# Patient Record
Sex: Female | Born: 1945
Health system: Southern US, Community
[De-identification: ages and names within clinical notes are randomized; demographics above are authoritative.]

## PROBLEM LIST (undated history)

## (undated) DIAGNOSIS — T4145XA Adverse effect of unspecified anesthetic, initial encounter: Secondary | ICD-10-CM

## (undated) DIAGNOSIS — K219 Gastro-esophageal reflux disease without esophagitis: Secondary | ICD-10-CM

## (undated) DIAGNOSIS — Z87442 Personal history of urinary calculi: Secondary | ICD-10-CM

## (undated) DIAGNOSIS — F40240 Claustrophobia: Secondary | ICD-10-CM

## (undated) DIAGNOSIS — I82409 Acute embolism and thrombosis of unspecified deep veins of unspecified lower extremity: Secondary | ICD-10-CM

## (undated) DIAGNOSIS — Z9289 Personal history of other medical treatment: Secondary | ICD-10-CM

## (undated) DIAGNOSIS — E039 Hypothyroidism, unspecified: Secondary | ICD-10-CM

## (undated) DIAGNOSIS — D649 Anemia, unspecified: Secondary | ICD-10-CM

## (undated) DIAGNOSIS — E7800 Pure hypercholesterolemia, unspecified: Secondary | ICD-10-CM

## (undated) DIAGNOSIS — Z8719 Personal history of other diseases of the digestive system: Secondary | ICD-10-CM

## (undated) DIAGNOSIS — I2699 Other pulmonary embolism without acute cor pulmonale: Secondary | ICD-10-CM

## (undated) DIAGNOSIS — T8859XA Other complications of anesthesia, initial encounter: Secondary | ICD-10-CM

## (undated) DIAGNOSIS — R011 Cardiac murmur, unspecified: Secondary | ICD-10-CM

## (undated) DIAGNOSIS — I10 Essential (primary) hypertension: Secondary | ICD-10-CM

## (undated) DIAGNOSIS — Z9889 Other specified postprocedural states: Secondary | ICD-10-CM

## (undated) DIAGNOSIS — R112 Nausea with vomiting, unspecified: Secondary | ICD-10-CM

## (undated) DIAGNOSIS — M1990 Unspecified osteoarthritis, unspecified site: Secondary | ICD-10-CM

## (undated) DIAGNOSIS — F410 Panic disorder [episodic paroxysmal anxiety] without agoraphobia: Secondary | ICD-10-CM

## (undated) DIAGNOSIS — I350 Nonrheumatic aortic (valve) stenosis: Secondary | ICD-10-CM

## (undated) HISTORY — DX: Acute embolism and thrombosis of unspecified deep veins of unspecified lower extremity: I82.409

## (undated) HISTORY — DX: Anemia, unspecified: D64.9

## (undated) HISTORY — PX: COLONOSCOPY: SHX174

## (undated) HISTORY — PX: BACK SURGERY: SHX140

## (undated) HISTORY — PX: CARDIAC CATHETERIZATION: SHX172

## (undated) HISTORY — PX: DILATION AND CURETTAGE OF UTERUS: SHX78

## (undated) HISTORY — PX: JOINT REPLACEMENT: SHX530

## (undated) HISTORY — PX: FRACTURE SURGERY: SHX138

## (undated) HISTORY — PX: CARPAL TUNNEL RELEASE: SHX101

---

## 1971-10-19 HISTORY — PX: APPENDECTOMY: SHX54

## 1971-10-19 HISTORY — PX: TUBAL LIGATION: SHX77

## 1972-10-18 HISTORY — PX: MASS EXCISION: SHX2000

## 1973-10-18 HISTORY — PX: ABDOMINAL HYSTERECTOMY: SHX81

## 1984-10-18 HISTORY — PX: TONSILLECTOMY: SUR1361

## 1995-10-19 HISTORY — PX: CHOLECYSTECTOMY: SHX55

## 1998-05-23 ENCOUNTER — Ambulatory Visit (HOSPITAL_COMMUNITY): Admission: RE | Admit: 1998-05-23 | Discharge: 1998-05-23 | Payer: Self-pay | Admitting: Neurological Surgery

## 1998-06-02 ENCOUNTER — Ambulatory Visit (HOSPITAL_COMMUNITY): Admission: RE | Admit: 1998-06-02 | Discharge: 1998-06-02 | Payer: Self-pay | Admitting: Neurological Surgery

## 1999-03-12 ENCOUNTER — Encounter: Admission: RE | Admit: 1999-03-12 | Discharge: 1999-04-03 | Payer: Self-pay | Admitting: Neurological Surgery

## 1999-04-09 ENCOUNTER — Encounter: Admission: RE | Admit: 1999-04-09 | Discharge: 1999-07-08 | Payer: Self-pay | Admitting: Neurological Surgery

## 1999-04-14 ENCOUNTER — Other Ambulatory Visit: Admission: RE | Admit: 1999-04-14 | Discharge: 1999-04-14 | Payer: Self-pay | Admitting: Obstetrics and Gynecology

## 1999-05-18 ENCOUNTER — Ambulatory Visit (HOSPITAL_COMMUNITY): Admission: RE | Admit: 1999-05-18 | Discharge: 1999-05-18 | Payer: Self-pay | Admitting: Neurological Surgery

## 1999-05-18 ENCOUNTER — Encounter: Payer: Self-pay | Admitting: Neurological Surgery

## 1999-07-16 ENCOUNTER — Encounter: Payer: Self-pay | Admitting: Neurological Surgery

## 1999-07-20 ENCOUNTER — Inpatient Hospital Stay (HOSPITAL_COMMUNITY): Admission: RE | Admit: 1999-07-20 | Discharge: 1999-07-21 | Payer: Self-pay | Admitting: Neurological Surgery

## 1999-07-20 ENCOUNTER — Encounter: Payer: Self-pay | Admitting: Neurological Surgery

## 1999-08-11 ENCOUNTER — Encounter: Payer: Self-pay | Admitting: Neurological Surgery

## 1999-08-11 ENCOUNTER — Encounter: Admission: RE | Admit: 1999-08-11 | Discharge: 1999-08-11 | Payer: Self-pay | Admitting: Neurological Surgery

## 2000-05-18 ENCOUNTER — Encounter: Payer: Self-pay | Admitting: *Deleted

## 2000-05-18 ENCOUNTER — Inpatient Hospital Stay (HOSPITAL_COMMUNITY): Admission: AD | Admit: 2000-05-18 | Discharge: 2000-05-20 | Payer: Self-pay | Admitting: *Deleted

## 2000-06-17 ENCOUNTER — Other Ambulatory Visit: Admission: RE | Admit: 2000-06-17 | Discharge: 2000-06-17 | Payer: Self-pay | Admitting: Obstetrics and Gynecology

## 2000-08-02 ENCOUNTER — Ambulatory Visit (HOSPITAL_COMMUNITY): Admission: RE | Admit: 2000-08-02 | Discharge: 2000-08-02 | Payer: Self-pay | Admitting: Neurological Surgery

## 2000-08-10 ENCOUNTER — Other Ambulatory Visit: Admission: RE | Admit: 2000-08-10 | Discharge: 2000-08-10 | Payer: Self-pay | Admitting: Obstetrics and Gynecology

## 2001-02-01 ENCOUNTER — Encounter: Admission: RE | Admit: 2001-02-01 | Discharge: 2001-02-01 | Payer: Self-pay | Admitting: Neurological Surgery

## 2001-02-01 ENCOUNTER — Encounter: Payer: Self-pay | Admitting: Neurological Surgery

## 2001-02-23 ENCOUNTER — Encounter: Payer: Self-pay | Admitting: Neurological Surgery

## 2001-02-23 ENCOUNTER — Ambulatory Visit (HOSPITAL_COMMUNITY): Admission: RE | Admit: 2001-02-23 | Discharge: 2001-02-23 | Payer: Self-pay | Admitting: Neurological Surgery

## 2001-08-16 ENCOUNTER — Other Ambulatory Visit: Admission: RE | Admit: 2001-08-16 | Discharge: 2001-08-16 | Payer: Self-pay | Admitting: Obstetrics and Gynecology

## 2001-11-21 ENCOUNTER — Ambulatory Visit (HOSPITAL_COMMUNITY): Admission: RE | Admit: 2001-11-21 | Discharge: 2001-11-21 | Payer: Self-pay | Admitting: Gastroenterology

## 2001-12-26 ENCOUNTER — Ambulatory Visit (HOSPITAL_COMMUNITY): Admission: RE | Admit: 2001-12-26 | Discharge: 2001-12-26 | Payer: Self-pay | Admitting: Gastroenterology

## 2002-01-12 ENCOUNTER — Encounter: Payer: Self-pay | Admitting: Surgery

## 2002-01-12 ENCOUNTER — Encounter: Admission: RE | Admit: 2002-01-12 | Discharge: 2002-01-12 | Payer: Self-pay | Admitting: Surgery

## 2002-03-02 ENCOUNTER — Ambulatory Visit (HOSPITAL_COMMUNITY): Admission: RE | Admit: 2002-03-02 | Discharge: 2002-03-02 | Payer: Self-pay | Admitting: Surgery

## 2002-03-02 ENCOUNTER — Encounter: Payer: Self-pay | Admitting: Surgery

## 2002-09-14 ENCOUNTER — Encounter: Payer: Self-pay | Admitting: Neurological Surgery

## 2002-09-14 ENCOUNTER — Ambulatory Visit (HOSPITAL_COMMUNITY): Admission: RE | Admit: 2002-09-14 | Discharge: 2002-09-14 | Payer: Self-pay | Admitting: Neurological Surgery

## 2002-09-25 ENCOUNTER — Emergency Department (HOSPITAL_COMMUNITY): Admission: EM | Admit: 2002-09-25 | Discharge: 2002-09-25 | Payer: Self-pay | Admitting: Emergency Medicine

## 2002-09-25 ENCOUNTER — Encounter: Payer: Self-pay | Admitting: Neurological Surgery

## 2002-10-03 ENCOUNTER — Other Ambulatory Visit: Admission: RE | Admit: 2002-10-03 | Discharge: 2002-10-03 | Payer: Self-pay | Admitting: Obstetrics and Gynecology

## 2002-10-25 ENCOUNTER — Encounter: Payer: Self-pay | Admitting: Neurological Surgery

## 2002-10-29 ENCOUNTER — Encounter: Payer: Self-pay | Admitting: Neurological Surgery

## 2002-10-29 ENCOUNTER — Inpatient Hospital Stay (HOSPITAL_COMMUNITY): Admission: RE | Admit: 2002-10-29 | Discharge: 2002-11-03 | Payer: Self-pay | Admitting: Neurological Surgery

## 2002-11-29 ENCOUNTER — Encounter: Payer: Self-pay | Admitting: Neurological Surgery

## 2002-11-29 ENCOUNTER — Ambulatory Visit (HOSPITAL_COMMUNITY): Admission: RE | Admit: 2002-11-29 | Discharge: 2002-11-29 | Payer: Self-pay | Admitting: Neurological Surgery

## 2002-12-11 ENCOUNTER — Ambulatory Visit (HOSPITAL_COMMUNITY): Admission: RE | Admit: 2002-12-11 | Discharge: 2002-12-11 | Payer: Self-pay | Admitting: Neurological Surgery

## 2003-05-07 ENCOUNTER — Ambulatory Visit (HOSPITAL_COMMUNITY): Admission: RE | Admit: 2003-05-07 | Discharge: 2003-05-07 | Payer: Self-pay | Admitting: Internal Medicine

## 2003-05-07 ENCOUNTER — Encounter: Payer: Self-pay | Admitting: Internal Medicine

## 2003-10-29 ENCOUNTER — Other Ambulatory Visit: Admission: RE | Admit: 2003-10-29 | Discharge: 2003-10-29 | Payer: Self-pay | Admitting: Obstetrics and Gynecology

## 2003-11-19 HISTORY — PX: JOINT REPLACEMENT: SHX530

## 2004-04-23 ENCOUNTER — Emergency Department (HOSPITAL_COMMUNITY): Admission: EM | Admit: 2004-04-23 | Discharge: 2004-04-23 | Payer: Self-pay | Admitting: Emergency Medicine

## 2004-04-30 ENCOUNTER — Ambulatory Visit (HOSPITAL_COMMUNITY): Admission: RE | Admit: 2004-04-30 | Discharge: 2004-04-30 | Payer: Self-pay | Admitting: Endocrinology

## 2004-05-14 ENCOUNTER — Ambulatory Visit (HOSPITAL_COMMUNITY): Admission: RE | Admit: 2004-05-14 | Discharge: 2004-05-14 | Payer: Self-pay | Admitting: Gastroenterology

## 2004-07-08 ENCOUNTER — Encounter: Admission: RE | Admit: 2004-07-08 | Discharge: 2004-07-08 | Payer: Self-pay | Admitting: Orthopedic Surgery

## 2004-09-08 ENCOUNTER — Emergency Department (HOSPITAL_COMMUNITY): Admission: EM | Admit: 2004-09-08 | Discharge: 2004-09-08 | Payer: Self-pay | Admitting: Emergency Medicine

## 2004-10-22 ENCOUNTER — Inpatient Hospital Stay (HOSPITAL_COMMUNITY): Admission: RE | Admit: 2004-10-22 | Discharge: 2004-10-27 | Payer: Self-pay | Admitting: Orthopedic Surgery

## 2006-01-07 ENCOUNTER — Encounter: Admission: RE | Admit: 2006-01-07 | Discharge: 2006-01-07 | Payer: Self-pay | Admitting: Internal Medicine

## 2006-04-19 ENCOUNTER — Encounter: Admission: RE | Admit: 2006-04-19 | Discharge: 2006-04-19 | Payer: Self-pay | Admitting: Orthopedic Surgery

## 2006-04-27 ENCOUNTER — Encounter: Admission: RE | Admit: 2006-04-27 | Discharge: 2006-04-27 | Payer: Self-pay | Admitting: Obstetrics and Gynecology

## 2006-08-16 ENCOUNTER — Inpatient Hospital Stay (HOSPITAL_COMMUNITY): Admission: RE | Admit: 2006-08-16 | Discharge: 2006-08-20 | Payer: Self-pay | Admitting: Orthopedic Surgery

## 2006-09-20 ENCOUNTER — Encounter: Payer: Self-pay | Admitting: Vascular Surgery

## 2006-09-20 ENCOUNTER — Ambulatory Visit (HOSPITAL_COMMUNITY): Admission: RE | Admit: 2006-09-20 | Discharge: 2006-09-20 | Payer: Self-pay | Admitting: Orthopedic Surgery

## 2006-10-18 HISTORY — PX: HIP SURGERY: SHX245

## 2006-11-04 ENCOUNTER — Ambulatory Visit (HOSPITAL_COMMUNITY): Admission: RE | Admit: 2006-11-04 | Discharge: 2006-11-04 | Payer: Self-pay | Admitting: Neurological Surgery

## 2006-12-01 ENCOUNTER — Inpatient Hospital Stay (HOSPITAL_COMMUNITY): Admission: RE | Admit: 2006-12-01 | Discharge: 2006-12-21 | Payer: Self-pay | Admitting: Neurological Surgery

## 2006-12-12 ENCOUNTER — Ambulatory Visit: Payer: Self-pay | Admitting: Vascular Surgery

## 2006-12-12 ENCOUNTER — Encounter (INDEPENDENT_AMBULATORY_CARE_PROVIDER_SITE_OTHER): Payer: Self-pay | Admitting: Cardiology

## 2007-02-22 ENCOUNTER — Ambulatory Visit (HOSPITAL_COMMUNITY): Admission: RE | Admit: 2007-02-22 | Discharge: 2007-02-22 | Payer: Self-pay | Admitting: Internal Medicine

## 2007-02-22 ENCOUNTER — Encounter: Payer: Self-pay | Admitting: Vascular Surgery

## 2007-02-22 ENCOUNTER — Ambulatory Visit: Payer: Self-pay | Admitting: Vascular Surgery

## 2007-04-04 ENCOUNTER — Ambulatory Visit (HOSPITAL_COMMUNITY): Admission: RE | Admit: 2007-04-04 | Discharge: 2007-04-04 | Payer: Self-pay | Admitting: Endocrinology

## 2007-05-04 ENCOUNTER — Encounter: Admission: RE | Admit: 2007-05-04 | Discharge: 2007-05-04 | Payer: Self-pay | Admitting: Internal Medicine

## 2007-05-15 ENCOUNTER — Encounter (INDEPENDENT_AMBULATORY_CARE_PROVIDER_SITE_OTHER): Payer: Self-pay | Admitting: Orthopedic Surgery

## 2007-05-15 ENCOUNTER — Ambulatory Visit (HOSPITAL_COMMUNITY): Admission: RE | Admit: 2007-05-15 | Discharge: 2007-05-15 | Payer: Self-pay | Admitting: Orthopedic Surgery

## 2007-06-05 ENCOUNTER — Encounter: Admission: RE | Admit: 2007-06-05 | Discharge: 2007-06-05 | Payer: Self-pay | Admitting: Orthopedic Surgery

## 2007-06-05 ENCOUNTER — Ambulatory Visit: Payer: Self-pay | Admitting: Vascular Surgery

## 2007-10-19 HISTORY — PX: OTHER SURGICAL HISTORY: SHX169

## 2007-12-07 ENCOUNTER — Encounter: Admission: RE | Admit: 2007-12-07 | Discharge: 2007-12-07 | Payer: Self-pay | Admitting: Orthopedic Surgery

## 2008-01-16 ENCOUNTER — Observation Stay (HOSPITAL_COMMUNITY): Admission: EM | Admit: 2008-01-16 | Discharge: 2008-01-17 | Payer: Self-pay | Admitting: Emergency Medicine

## 2008-01-16 ENCOUNTER — Ambulatory Visit: Payer: Self-pay | Admitting: *Deleted

## 2008-01-30 ENCOUNTER — Inpatient Hospital Stay (HOSPITAL_COMMUNITY): Admission: RE | Admit: 2008-01-30 | Discharge: 2008-02-02 | Payer: Self-pay | Admitting: Orthopedic Surgery

## 2008-04-10 ENCOUNTER — Inpatient Hospital Stay (HOSPITAL_COMMUNITY): Admission: AD | Admit: 2008-04-10 | Discharge: 2008-04-15 | Payer: Self-pay | Admitting: Orthopedic Surgery

## 2008-08-09 ENCOUNTER — Emergency Department (HOSPITAL_COMMUNITY): Admission: EM | Admit: 2008-08-09 | Discharge: 2008-08-09 | Payer: Self-pay | Admitting: Emergency Medicine

## 2008-08-12 ENCOUNTER — Ambulatory Visit (HOSPITAL_COMMUNITY): Admission: RE | Admit: 2008-08-12 | Discharge: 2008-08-12 | Payer: Self-pay | Admitting: Neurological Surgery

## 2008-08-22 ENCOUNTER — Inpatient Hospital Stay (HOSPITAL_COMMUNITY): Admission: RE | Admit: 2008-08-22 | Discharge: 2008-08-23 | Payer: Self-pay | Admitting: Neurological Surgery

## 2008-10-15 ENCOUNTER — Ambulatory Visit (HOSPITAL_COMMUNITY): Admission: RE | Admit: 2008-10-15 | Discharge: 2008-10-15 | Payer: Self-pay | Admitting: Orthopedic Surgery

## 2009-06-06 ENCOUNTER — Encounter: Admission: RE | Admit: 2009-06-06 | Discharge: 2009-06-06 | Payer: Self-pay | Admitting: Orthopedic Surgery

## 2009-12-16 HISTORY — PX: JOINT REPLACEMENT: SHX530

## 2009-12-30 ENCOUNTER — Inpatient Hospital Stay (HOSPITAL_COMMUNITY): Admission: RE | Admit: 2009-12-30 | Discharge: 2010-01-02 | Payer: Self-pay | Admitting: Orthopedic Surgery

## 2010-02-17 ENCOUNTER — Ambulatory Visit: Payer: Self-pay | Admitting: Vascular Surgery

## 2010-03-13 ENCOUNTER — Encounter: Admission: RE | Admit: 2010-03-13 | Discharge: 2010-03-13 | Payer: Self-pay | Admitting: Orthopedic Surgery

## 2010-05-05 ENCOUNTER — Encounter (INDEPENDENT_AMBULATORY_CARE_PROVIDER_SITE_OTHER): Payer: Self-pay | Admitting: Orthopedic Surgery

## 2010-05-05 ENCOUNTER — Inpatient Hospital Stay (HOSPITAL_COMMUNITY): Admission: RE | Admit: 2010-05-05 | Discharge: 2010-05-08 | Payer: Self-pay | Admitting: Orthopedic Surgery

## 2010-07-13 ENCOUNTER — Ambulatory Visit (HOSPITAL_COMMUNITY): Admission: EM | Admit: 2010-07-13 | Discharge: 2010-07-13 | Payer: Self-pay | Admitting: Emergency Medicine

## 2010-11-08 ENCOUNTER — Encounter: Payer: Self-pay | Admitting: Neurological Surgery

## 2010-11-27 ENCOUNTER — Encounter (HOSPITAL_COMMUNITY)
Admission: RE | Admit: 2010-11-27 | Discharge: 2010-11-27 | Disposition: A | Discharge status: Home / Self Care | Payer: Medicare Other | Source: Ambulatory Visit | Attending: Orthopedic Surgery | Admitting: Orthopedic Surgery

## 2010-11-27 DIAGNOSIS — Z01812 Encounter for preprocedural laboratory examination: Secondary | ICD-10-CM | POA: Insufficient documentation

## 2010-11-27 LAB — URINALYSIS, ROUTINE W REFLEX MICROSCOPIC
Leukocytes, UA: NEGATIVE
Urine Glucose, Fasting: NEGATIVE mg/dL
Urobilinogen, UA: 0.2 mg/dL (ref 0.0–1.0)
pH: 6 (ref 5.0–8.0)

## 2010-11-27 LAB — DIFFERENTIAL
Basophils Absolute: 0 10*3/uL (ref 0.0–0.1)
Lymphocytes Relative: 25 % (ref 12–46)

## 2010-11-27 LAB — BASIC METABOLIC PANEL
Calcium: 10.6 mg/dL — ABNORMAL HIGH (ref 8.4–10.5)
Chloride: 101 mEq/L (ref 96–112)
GFR calc Af Amer: >60 mL/min (ref >60)

## 2010-11-27 LAB — CBC
MCV: 80.9 fL (ref 78.0–100.0)
Platelets: 383 10*3/uL (ref 150–400)
WBC: 7.4 10*3/uL (ref 4.0–10.5)

## 2010-11-27 LAB — PROTIME-INR: INR: 1.25 (ref 0.00–1.49)

## 2010-11-27 LAB — URINE MICROSCOPIC-ADD ON

## 2010-11-29 ENCOUNTER — Emergency Department (HOSPITAL_COMMUNITY): Payer: Medicare Other

## 2010-11-29 ENCOUNTER — Emergency Department (HOSPITAL_COMMUNITY)
Admission: EM | Admit: 2010-11-29 | Discharge: 2010-11-29 | Disposition: A | Discharge status: Other Acute Inpatient Hospital | Payer: Medicare Other | Source: Home / Self Care | Attending: Emergency Medicine | Admitting: Emergency Medicine

## 2010-11-29 ENCOUNTER — Inpatient Hospital Stay (HOSPITAL_COMMUNITY)
Admission: AD | Admit: 2010-11-29 | Discharge: 2010-12-03 | DRG: 560 | Disposition: A | Discharge status: Home Health | Payer: Medicare Other | Source: Ambulatory Visit | Attending: Orthopedic Surgery | Admitting: Orthopedic Surgery

## 2010-11-29 DIAGNOSIS — M67919 Unspecified disorder of synovium and tendon, unspecified shoulder: Secondary | ICD-10-CM | POA: Diagnosis present

## 2010-11-29 DIAGNOSIS — Z7901 Long term (current) use of anticoagulants: Secondary | ICD-10-CM

## 2010-11-29 DIAGNOSIS — Z01812 Encounter for preprocedural laboratory examination: Secondary | ICD-10-CM

## 2010-11-29 DIAGNOSIS — T84029A Dislocation of unspecified internal joint prosthesis, initial encounter: Secondary | ICD-10-CM | POA: Insufficient documentation

## 2010-11-29 DIAGNOSIS — Z01818 Encounter for other preprocedural examination: Secondary | ICD-10-CM | POA: Insufficient documentation

## 2010-11-29 DIAGNOSIS — Z96649 Presence of unspecified artificial hip joint: Secondary | ICD-10-CM | POA: Insufficient documentation

## 2010-11-29 DIAGNOSIS — I10 Essential (primary) hypertension: Secondary | ICD-10-CM | POA: Diagnosis present

## 2010-11-29 DIAGNOSIS — E039 Hypothyroidism, unspecified: Secondary | ICD-10-CM | POA: Diagnosis present

## 2010-11-29 DIAGNOSIS — Z79899 Other long term (current) drug therapy: Secondary | ICD-10-CM

## 2010-11-29 DIAGNOSIS — Y831 Surgical operation with implant of artificial internal device as the cause of abnormal reaction of the patient, or of later complication, without mention of misadventure at the time of the procedure: Secondary | ICD-10-CM | POA: Diagnosis present

## 2010-11-29 DIAGNOSIS — Z86711 Personal history of pulmonary embolism: Secondary | ICD-10-CM

## 2010-11-29 DIAGNOSIS — X58XXXA Exposure to other specified factors, initial encounter: Secondary | ICD-10-CM | POA: Insufficient documentation

## 2010-11-29 DIAGNOSIS — G4700 Insomnia, unspecified: Secondary | ICD-10-CM | POA: Diagnosis present

## 2010-11-29 DIAGNOSIS — F411 Generalized anxiety disorder: Secondary | ICD-10-CM | POA: Diagnosis present

## 2010-11-29 DIAGNOSIS — Z7982 Long term (current) use of aspirin: Secondary | ICD-10-CM

## 2010-11-29 DIAGNOSIS — Y92009 Unspecified place in unspecified non-institutional (private) residence as the place of occurrence of the external cause: Secondary | ICD-10-CM | POA: Insufficient documentation

## 2010-11-29 DIAGNOSIS — I509 Heart failure, unspecified: Secondary | ICD-10-CM | POA: Insufficient documentation

## 2010-11-29 DIAGNOSIS — D649 Anemia, unspecified: Secondary | ICD-10-CM | POA: Diagnosis present

## 2010-11-29 DIAGNOSIS — Z6841 Body Mass Index (BMI) 40.0 and over, adult: Secondary | ICD-10-CM

## 2010-11-29 DIAGNOSIS — M719 Bursopathy, unspecified: Secondary | ICD-10-CM | POA: Diagnosis present

## 2010-11-29 DIAGNOSIS — K219 Gastro-esophageal reflux disease without esophagitis: Secondary | ICD-10-CM | POA: Diagnosis present

## 2010-11-29 DIAGNOSIS — S73005A Unspecified dislocation of left hip, initial encounter: Secondary | ICD-10-CM

## 2010-11-29 DIAGNOSIS — E7800 Pure hypercholesterolemia, unspecified: Secondary | ICD-10-CM | POA: Diagnosis present

## 2010-11-29 DIAGNOSIS — M7989 Other specified soft tissue disorders: Secondary | ICD-10-CM | POA: Diagnosis present

## 2010-11-29 LAB — POCT I-STAT, CHEM 8
Calcium, Ion: 1.14 mmol/L (ref 1.12–1.32)
Chloride: 106 mEq/L (ref 96–112)
Glucose, Bld: 110 mg/dL — ABNORMAL HIGH (ref 70–99)
HCT: 36 % (ref 36.0–46.0)

## 2010-11-30 ENCOUNTER — Ambulatory Visit (HOSPITAL_COMMUNITY): Admission: RE | Admit: 2010-11-30 | Payer: Medicare Other | Source: Ambulatory Visit | Admitting: Orthopedic Surgery

## 2010-11-30 ENCOUNTER — Inpatient Hospital Stay (HOSPITAL_COMMUNITY): Payer: Medicare Other

## 2010-11-30 HISTORY — PX: TOTAL SHOULDER REPLACEMENT: SUR1217

## 2010-11-30 LAB — CBC
HCT: 30.9 % — ABNORMAL LOW (ref 36.0–46.0)
MCHC: 30.4 g/dL (ref 30.0–36.0)
MCV: 82.8 fL (ref 78.0–100.0)
RDW: 14.8 % (ref 11.5–15.5)

## 2010-11-30 LAB — POCT I-STAT 4, (NA,K, GLUC, HGB,HCT): Hemoglobin: 9.5 g/dL — ABNORMAL LOW (ref 12.0–15.0)

## 2010-11-30 LAB — BASIC METABOLIC PANEL
BUN: 9 mg/dL (ref 6–23)
CO2: 27 mEq/L (ref 19–32)
Chloride: 106 mEq/L (ref 96–112)
Creatinine, Ser: 0.78 mg/dL (ref 0.4–1.2)
Glucose, Bld: 100 mg/dL — ABNORMAL HIGH (ref 70–99)

## 2010-11-30 LAB — PROTIME-INR: INR: 1.02 (ref 0.00–1.49)

## 2010-12-01 LAB — CBC
HCT: 34 % — ABNORMAL LOW (ref 36.0–46.0)
Hemoglobin: 10.7 g/dL — ABNORMAL LOW (ref 12.0–15.0)
RBC: 4.2 MIL/uL (ref 3.87–5.11)
WBC: 9.3 10*3/uL (ref 4.0–10.5)

## 2010-12-01 LAB — BASIC METABOLIC PANEL
Chloride: 103 mEq/L (ref 96–112)
GFR calc Af Amer: >60 mL/min (ref >60)
GFR calc non Af Amer: >60 mL/min (ref >60)
Potassium: 3.7 mEq/L (ref 3.5–5.1)

## 2010-12-01 LAB — PROTIME-INR: INR: 1.07 (ref 0.00–1.49)

## 2010-12-02 LAB — CBC
HCT: 32.6 % — ABNORMAL LOW (ref 36.0–46.0)
MCV: 81.3 fL (ref 78.0–100.0)
Platelets: 283 10*3/uL (ref 150–400)
RBC: 4.01 MIL/uL (ref 3.87–5.11)
WBC: 10 10*3/uL (ref 4.0–10.5)

## 2010-12-02 LAB — BASIC METABOLIC PANEL
BUN: 7 mg/dL (ref 6–23)
Chloride: 100 mEq/L (ref 96–112)
Potassium: 4 mEq/L (ref 3.5–5.1)
Sodium: 137 mEq/L (ref 135–145)

## 2010-12-02 LAB — PROTIME-INR: Prothrombin Time: 17.1 seconds — ABNORMAL HIGH (ref 11.6–15.2)

## 2010-12-02 LAB — TYPE AND SCREEN
Antibody Screen: NEGATIVE
Unit division: 0

## 2010-12-03 LAB — PROTIME-INR
INR: 0.93 (ref 0.00–1.49)
Prothrombin Time: 12.7 seconds (ref 11.6–15.2)

## 2010-12-07 NOTE — H&P (Signed)
[name], [name]               ACCOUNT NO.:  [id number]  MEDICAL RECORD NO.:  [id number]           PATIENT TYPE:  I  LOCATION:  5014                         FACILITY:  [facility]  PHYSICIAN:  Madlyn Frankel. Charlann Boxer, M.D.  DATE OF BIRTH:  01/16/1946  DATE OF ADMISSION:  11/29/2010 DATE OF DISCHARGE:                             HISTORY & PHYSICAL   CHIEF COMPLAINT:  Dislocated left total hip replacement.  HISTORY OF PRESENT ILLNESS:  [name] is a 65 year old patient of mine with history of a left hip revision surgery that was performed on July 2011.  She had done well with it initially, but unfortunately had dislocation 2 months later and had been treated conservatively of this until today.  She reported 2-day history of the hip wound and to feel like want to subluxation.  She was starting to stop some of her exercise when she slipped a little bit on some wet spot on the floor.  She felt a pop and fell down.  She has no other injuries to report other than significant amount of pain.  Importantly, she is scheduled to have a shoulder replacement by Dr. Beverely Low tomorrow at 1:00 p.m.  PAST MEDICAL HISTORY: 1. Obesity. 2. Hypertension. 3. Coumadin therapy. 4. History of pulmonary embolus. 5. History of anxiety. 6. Anemia. 7. Reflux disease. 8. Hypercholesterolemia. 9. Hypothyroidism. 10.Insomnia. 11.Arthritis. 12.Status post multiple surgeries of her knees and hip and renal     insufficiency.  PAST SURGICAL HISTORY: 1. Multiple surgical procedure of knees. 2. Left total knee replacement. 3. Left total hip replacement x2 including a closed reduction. 4. 14 back operations. 5. Carpal tunnel release. 6. Cholecystectomy. 7. Right shoulder rotator cuff debridement. 8. Tonsillectomy. 9. Tubal ligation. 10.Partial hysterectomy.  SOCIAL HISTORY:  She is married.  Denies smoking.  She is retired and disabled.  FAMILY HISTORY:  Positive for coronary artery disease,  hypertension, diabetes, cancer, stroke, and arthritis.  DRUG HOURS:  None.  CURRENT MEDICATIONS: 1. Levothyroxine 75 mcg daily. 2. Flexeril. 3. Lasix. 4. Ambien. 5. Atenolol. 6. Alprazolam. 7. TriCor. 8. Norco as needed for pain. 9. Jantoven. 10.Prevacid. 11.Calcium. 12.Multivitamins. 13.Aspirin.  PHYSICAL EXAMINATION:  GENERAL:  She is seen in the Emergency Room today, in obvious discomfort. VITAL SIGNS:  Stable. EXTREMITIES:  She has shortened left lower extremity.  The range of motion was not assessed based on radiographic findings.  She is otherwise neurovascularly intact.  Left lower extremity has no signs of infection.  Radiographs in the ER revealed a dislocated left total hip replacement.  ASSESSMENT:  Dislocated left total hip replacement, now for the second time.  PLAN:  Based on her recent dislocation, she will be taken to the operating room for a closed reduction.  I believe based on this being the second dislocation, she does have to have her contralateral.  She did report some lengthened right lower extremity most likely related to her knee surgeries.  It is possible that she will need to get back to the Operating Room with me for attempted revision surgery versus constrained liner placement.  We have briefly tested on this.  At this  point, however, she will be taken to Operating Room for closed reduction.  She will be discharged to home with a knee immobilizer in place and plan to see me back in 2 or 3 weeks.  For my standpoint, I would be fine with her proceeding with a shoulder surgery with Dr. Ranell Patrick to try to get this addressed.     Madlyn Frankel Charlann Boxer, M.D.     MDO/MEDQ  D:  11/29/2010  T:  11/29/2010  Job:  [id number]  Electronically Signed by Durene Romans M.D. on 12/07/2010 10:50:26 AM

## 2010-12-07 NOTE — Op Note (Signed)
  [name], [name]               ACCOUNT NO.:  [id number]  MEDICAL RECORD NO.:  [id number]           PATIENT TYPE:  I  LOCATION:  5014                         FACILITY:  [facility]  PHYSICIAN:  Madlyn Frankel. Charlann Boxer, M.D.  DATE OF BIRTH:  23-Oct-1945  DATE OF PROCEDURE:  11/29/2010 DATE OF DISCHARGE:                              OPERATIVE REPORT   PREOPERATIVE DIAGNOSIS:  Dislocated left total hip replacement.  POSTOPERATIVE DIAGNOSIS:  Dislocated left total hip replacement.  PROCEDURE:  Closed reduction of the left total hip replacement under anesthesia.  COMPLICATIONS:  None.  SPECIMEN:  None.  INDICATIONS OF PROCEDURE:  [name] is a 65 year old patient of mine with history of revised left total hip replacement in July 2011, with subsequent index dislocation in September 2011.  She had done well until recently when she began to notice some little bit of slipping type sensation and then today slipped on a floor and some water and felt a pop and fell down because of her hip.  She is here for closed reduction.  Radiographs in the Emergency Room revealed posterior dislocation of left hip.  After reviewing with she and her family current situation, plan was get her in the Operating Room to close reduce the left hip.  PROCEDURE IN DETAIL:  The patient was brought to operative theater. Once adequate anesthesia was established.  Time-out had been performed identifying the patient, planned procedure, and extremity.  Once adequate anesthesia was established and a time-out performed, flexion and traction and external rotation, the hip was reduced fairly easily under the induction agent.  Radiographs in the operating room confirmed reduction of left hip.  She was subsequently awoken from anesthesia and brought to recovery room in stable condition tolerating the procedure well.     Madlyn Frankel Charlann Boxer, M.D.     MDO/MEDQ  D:  11/29/2010  T:  11/29/2010  Job:   [id number]  Electronically Signed by Durene Romans M.D. on 12/07/2010 10:50:29 AM

## 2010-12-11 NOTE — Op Note (Signed)
[name], [name]               ACCOUNT NO.:  [id number]  MEDICAL RECORD NO.:  [id number]           PATIENT TYPE:  LOCATION:                                 FACILITY:  PHYSICIAN:  Almedia Balls. Ranell Patrick, M.D. DATE OF BIRTH:  October 26, 1945  DATE OF PROCEDURE:  11/30/2010 DATE OF DISCHARGE:                              OPERATIVE REPORT   PREOPERATIVE DIAGNOSES: 1. Right shoulder rotator cuff tear arthropathy. 2. Right hand index finger and thumb metacarpal phalangeal joint     arthritis.  POSTOPERATIVE DIAGNOSES: 1. Right shoulder rotator cuff tear arthropathy. 2. Right hand index finger and thumb metacarpal phalangeal joint     arthritis.  PROCEDURE PERFORMED: 1. Right shoulder reverse total shoulder arthroplasty (using DePuy     Delta Xtend implant). 2. Right index finger metacarpophalangeal joint injection. 3. Right hand thumb metacarpophalangeal joint injection.  SURGEON:  Almedia Balls. Ranell Patrick, MD  ASSISTANT:  Donnie Coffin. Dixon, PA  General anesthesia plus interscalene block anesthesia was used.  ESTIMATED BLOOD LOSS:  150 mL.  FLUID REPLACEMENT:  Crystalloid 1500 mL.  INSTRUMENT COUNTS:  Correct.  There were no complications.  Perioperative antibiotics were given.  INDICATIONS:  The patient is a 65 year old female with end-stage arthritis of the right shoulder.  The patient is rotator cuff deficient and has a significant shoulder deformity with a flat humeral head.  The patient has had disabling pain and limited function, presents now desiring total shoulder arthroplasty using a reverse total shoulder given she is cuff deficient.  Risks and benefits of this procedure were discussed.  The patient would like to proceed.  She also wanted to have her right index finger and her metacarpophalangeal joint arthritic joint injected with cortisone while she was asleep.  Informed consent was obtained for that as well.  DESCRIPTION OF PROCEDURE:  After adequate level of anesthesia  was achieved, the patient was positioned in the modified beach-chair position.  The right shoulder was sterilely prepped and draped in the usual manner.  We entered the shoulder used standard deltopectoral incision starting at he coracoid process extending down into her internal humerus.  Dissection was carried out down through subcu tissues using Bovie.  Cephalic vein was identified, taken laterally to the deltoid.  Pectoralis was taken medially.  The upper centimeter of pectoralis insertion on the humerus was released.  Conjoined tendon was taken medially.  Subscapularis was very thin and basically not really there.  We just removed that off using Bovie off the lesser tuberosity. There was essentially no rotator cuff on top.  There was a little bit present posteriorly to deliver the humerus out of the wound.  We placed our intramedullary guide which was 12 mm and then resected the proximal humerus using deltopectoral resection guide.  Once that was resected, we went ahead and placed our Shim on top of the cut edge of the bone and retracted it to the humerus posteriorly.  We then went ahead and did a 360-degrees release of the capsule and labrum off the glenoid face.  The cartilage was completely worn off.  At this point, we placed our central guidepin and  then reamed for the metaglene.  We then did our peripheral hand reaming and then drilled our central peg hole and placed the metaglene after thoroughly irrigating.  We impacted that in place and used three locked screws, nice 42 anteriorly, 30 proximally, and 24 anteriorly, and then an 18 nonlocked posteriorly.  We had great bone support for the metaglene and excellent purchase with our screws.  We then placed a 38 eccentric glenosphere to get good clearance anteriorly and inferiorly for our glenosphere.  Once that was screwed into position, we went ahead and finished our preparation on the humerus again delivering the humerus out of  the wound.  We did our proximal reaming, set on eccentric one right and reamed out for our humeral implant.  We then placed our 12 stem with the one right metaphyseal component screwed at the 0-degree position.  We impacted that in place, reduced the shoulder with first +3 and then a +6, 38 insert, and then we were happy with that soft tissue balancing.  We did a little bit more soft tissue removal posteriorly.  There was some extra capsule back there and some bone off the posterior humerus so we had to remove to make sure we impinge posteriorly.  Once that was done, we had nice gliding motion.  We went ahead and removed our trial implants, thoroughly irrigated the humerus, and then impacted the real hydroxyapatite-coated Press-Fit prosthesis in on the humeral side, again was a 12 stem attached to a one right metaphysis that was carried into place and impacted into the bone.  We had excellent and this was at between 0 and 10 degrees retroversion.  We again reduced the shoulder. We were happy with that position and the implant with +9.  We were able to get the +9 which was a little tighter, did not shuck it all and barely any gapping in about 45 degrees of external rotation.  We thoroughly irrigated, checked our nerve again.  The axillary nerve was protected and checked for bleeding, none was encountered, and then we closed the wound with deltopectoral closure with 0 Vicryl suture followed by 2-0 Vicryl subcutaneous closure and 4-0 Monocryl for skin. Steri-Strips applied followed by a sterile dressing.  The patient tolerated surgery well.     Almedia Balls. Ranell Patrick, M.D.     SRN/MEDQ  D:  11/30/2010  T:  12/01/2010  Job:  [id number]  Electronically Signed by Malon Kindle  on 12/11/2010 10:50:35 AM

## 2011-01-02 LAB — CBC
HCT: 25.5 % — ABNORMAL LOW (ref 36.0–46.0)
HCT: 27.9 % — ABNORMAL LOW (ref 36.0–46.0)
Hemoglobin: 8.5 g/dL — ABNORMAL LOW (ref 12.0–15.0)
Hemoglobin: 9.3 g/dL — ABNORMAL LOW (ref 12.0–15.0)
MCH: 27.2 pg (ref 26.0–34.0)
MCH: 27.4 pg (ref 26.0–34.0)
MCHC: 33.3 g/dL (ref 30.0–36.0)
MCV: 81.6 fL (ref 78.0–100.0)
MCV: 81.8 fL (ref 78.0–100.0)
Platelets: 297 10*3/uL (ref 150–400)
RBC: 3.12 MIL/uL — ABNORMAL LOW (ref 3.87–5.11)
RBC: 3.41 MIL/uL — ABNORMAL LOW (ref 3.87–5.11)
WBC: 10.9 10*3/uL — ABNORMAL HIGH (ref 4.0–10.5)

## 2011-01-02 LAB — BASIC METABOLIC PANEL
CO2: 27 mEq/L (ref 19–32)
Chloride: 106 mEq/L (ref 96–112)
GFR calc Af Amer: >60 mL/min (ref >60)
Glucose, Bld: 112 mg/dL — ABNORMAL HIGH (ref 70–99)
Potassium: 4.1 mEq/L (ref 3.5–5.1)
Sodium: 138 mEq/L (ref 135–145)

## 2011-01-02 LAB — PROTIME-INR
INR: 1.6 — ABNORMAL HIGH (ref 0.00–1.49)
Prothrombin Time: 13.5 seconds (ref 11.6–15.2)
Prothrombin Time: 16 seconds — ABNORMAL HIGH (ref 11.6–15.2)

## 2011-01-02 LAB — TYPE AND SCREEN: ABO/RH(D): A POS

## 2011-01-02 NOTE — Discharge Summary (Signed)
  NAMEJANEA, SCHWENN               ACCOUNT NO.:  [id number]  MEDICAL RECORD NO.:  [id number]           PATIENT TYPE:  I  LOCATION:  5014                         FACILITY:  [facility]  PHYSICIAN:  Almedia Balls. Ranell Patrick, M.D. DATE OF BIRTH:  02/15/1946  DATE OF ADMISSION:  11/29/2010 DATE OF DISCHARGE:  12/03/2010                              DISCHARGE SUMMARY   ADMISSION DIAGNOSES: 1. Right shoulder pain secondary to rotator cuff arthropathy. 2. Left hip dislocation.  BRIEF HISTORY:  The patient is a 65 year old female with worsening right shoulder pain secondary to rotator cuff arthropathy a day before scheduled procedure.  The patient had a left hip dislocation and was admitted for surgical management of the left hip.  PROCEDURE: 1. The patient had a closed reduction of the left hip, total hip     arthroplasty by Dr. Durene Romans on November 29, 2010.  No     complications. 2. The patient had a reverse total shoulder arthroplasty by Dr. Malon Kindle on November 30, 2010 along with injections of her     metacarpophalangeal joints, index finger, and hand during that     procedure by Dr. Malon Kindle.  ASSISTANT:  Standley Dakins, PA-C.  ANESTHESIA:  General anesthesia was used.  No complications.  HOSPITAL COURSE:  The patient admitted on November 29, 2010 for a closed hip reduction prior to her total shoulder arthroplasty.  The patient tolerated both procedures well, procedure 1 on February 12, procedure 2 on 13th.  She was kept for several days for general physical therapy with knee immobilizer on the left leg and sling on the right.  Skilled nursing facility placement was debated in regards to having 2 extremities injured.  The patient tolerated the hospital stay quite well.  She was neurologically intact.  Incision was healing well on the right and the patient was afebrile and tolerated the hospital stay.  DISCHARGE PLAN:  The patient will discharged to skilled nursing  facility on December 03, 2010.  CONDITION:  Stable.  DIET:  Regular.  DISCHARGE MEDICATIONS: 1. Percocet 5/325, 1-2 tablets q.4-6 h. p.r.n. pain. 2. Robaxin 500 mg p.o. q.6 h.     Thomas B. Dixon, P.A.   ______________________________ Almedia Balls. Ranell Patrick, M.D.    TBD/MEDQ  D:  12/29/2010  T:  12/30/2010  Job:  [id number]  Electronically Signed by Standley Dakins P.A. on 12/30/2010 08:28:47 AM Electronically Signed by Malon Kindle  on 01/02/2011 11:22:01 AM

## 2011-01-03 LAB — CBC
HCT: 37.6 % (ref 36.0–46.0)
Platelets: 377 10*3/uL (ref 150–400)
RDW: 15.2 % (ref 11.5–15.5)
WBC: 5.4 10*3/uL (ref 4.0–10.5)

## 2011-01-03 LAB — URINALYSIS, ROUTINE W REFLEX MICROSCOPIC
Bilirubin Urine: NEGATIVE
Glucose, UA: NEGATIVE mg/dL
Protein, ur: NEGATIVE mg/dL

## 2011-01-03 LAB — SURGICAL PCR SCREEN
MRSA, PCR: NEGATIVE
Staphylococcus aureus: NEGATIVE

## 2011-01-03 LAB — DIFFERENTIAL
Basophils Absolute: 0 10*3/uL (ref 0.0–0.1)
Lymphocytes Relative: 19 % (ref 12–46)
Neutro Abs: 3.6 10*3/uL (ref 1.7–7.7)

## 2011-01-03 LAB — BASIC METABOLIC PANEL
BUN: 12 mg/dL (ref 6–23)
Creatinine, Ser: 0.9 mg/dL (ref 0.4–1.2)
GFR calc non Af Amer: >60 mL/min (ref >60)
Potassium: 4.5 mEq/L (ref 3.5–5.1)

## 2011-01-03 LAB — URINE MICROSCOPIC-ADD ON

## 2011-01-03 LAB — PROTIME-INR: Prothrombin Time: 26.6 seconds — ABNORMAL HIGH (ref 11.6–15.2)

## 2011-01-03 LAB — APTT: aPTT: 40 seconds — ABNORMAL HIGH (ref 24–37)

## 2011-01-10 LAB — BASIC METABOLIC PANEL
BUN: 14 mg/dL (ref 6–23)
BUN: 16 mg/dL (ref 6–23)
CO2: 26 mEq/L (ref 19–32)
CO2: 29 mEq/L (ref 19–32)
Calcium: 10.2 mg/dL (ref 8.4–10.5)
Calcium: 8.9 mg/dL (ref 8.4–10.5)
Chloride: 102 mEq/L (ref 96–112)
Chloride: 102 mEq/L (ref 96–112)
Creatinine, Ser: 1.06 mg/dL (ref 0.4–1.2)
Creatinine, Ser: 1.06 mg/dL (ref 0.4–1.2)
GFR calc Af Amer: >60 mL/min (ref >60)
GFR calc non Af Amer: 51 mL/min — ABNORMAL LOW (ref >60)
Glucose, Bld: 119 mg/dL — ABNORMAL HIGH (ref 70–99)
Glucose, Bld: 132 mg/dL — ABNORMAL HIGH (ref 70–99)
Potassium: 4.3 mEq/L (ref 3.5–5.1)

## 2011-01-10 LAB — CBC
HCT: 28.8 % — ABNORMAL LOW (ref 36.0–46.0)
MCHC: 31.7 g/dL (ref 30.0–36.0)
MCHC: 31.7 g/dL (ref 30.0–36.0)
MCHC: 33.6 g/dL (ref 30.0–36.0)
MCV: 83.8 fL (ref 78.0–100.0)
MCV: 84.9 fL (ref 78.0–100.0)
Platelets: 311 10*3/uL (ref 150–400)
RBC: 4.55 MIL/uL (ref 3.87–5.11)
RDW: 15.9 % — ABNORMAL HIGH (ref 11.5–15.5)
RDW: 16.3 % — ABNORMAL HIGH (ref 11.5–15.5)
WBC: 6.1 10*3/uL (ref 4.0–10.5)

## 2011-01-10 LAB — PROTIME-INR
INR: 1.24 (ref 0.00–1.49)
INR: 1.46 (ref 0.00–1.49)
Prothrombin Time: 17.6 seconds — ABNORMAL HIGH (ref 11.6–15.2)

## 2011-01-10 LAB — DIFFERENTIAL
Basophils Relative: 1 % (ref 0–1)
Lymphs Abs: 1.2 10*3/uL (ref 0.7–4.0)
Monocytes Relative: 10 % (ref 3–12)
Neutro Abs: 4.2 10*3/uL (ref 1.7–7.7)
Neutrophils Relative %: 68 % (ref 43–77)

## 2011-01-10 LAB — URINALYSIS, ROUTINE W REFLEX MICROSCOPIC
Bilirubin Urine: NEGATIVE
Nitrite: NEGATIVE
Protein, ur: NEGATIVE mg/dL
Specific Gravity, Urine: 1.012 (ref 1.005–1.030)
Urobilinogen, UA: 0.2 mg/dL (ref 0.0–1.0)

## 2011-01-10 LAB — APTT: aPTT: 29 seconds (ref 24–37)

## 2011-01-10 LAB — TYPE AND SCREEN: Antibody Screen: NEGATIVE

## 2011-02-08 ENCOUNTER — Other Ambulatory Visit: Payer: Self-pay | Admitting: Orthopedic Surgery

## 2011-02-08 ENCOUNTER — Encounter (HOSPITAL_COMMUNITY): Payer: Medicare Other | Attending: Orthopedic Surgery

## 2011-02-08 DIAGNOSIS — E039 Hypothyroidism, unspecified: Secondary | ICD-10-CM | POA: Insufficient documentation

## 2011-02-08 DIAGNOSIS — E669 Obesity, unspecified: Secondary | ICD-10-CM | POA: Insufficient documentation

## 2011-02-08 DIAGNOSIS — Z01812 Encounter for preprocedural laboratory examination: Secondary | ICD-10-CM | POA: Insufficient documentation

## 2011-02-08 DIAGNOSIS — Z7901 Long term (current) use of anticoagulants: Secondary | ICD-10-CM | POA: Insufficient documentation

## 2011-02-08 DIAGNOSIS — Y831 Surgical operation with implant of artificial internal device as the cause of abnormal reaction of the patient, or of later complication, without mention of misadventure at the time of the procedure: Secondary | ICD-10-CM | POA: Insufficient documentation

## 2011-02-08 DIAGNOSIS — T84029A Dislocation of unspecified internal joint prosthesis, initial encounter: Secondary | ICD-10-CM | POA: Insufficient documentation

## 2011-02-08 DIAGNOSIS — I10 Essential (primary) hypertension: Secondary | ICD-10-CM | POA: Insufficient documentation

## 2011-02-08 DIAGNOSIS — Z79899 Other long term (current) drug therapy: Secondary | ICD-10-CM | POA: Insufficient documentation

## 2011-02-08 DIAGNOSIS — Z96649 Presence of unspecified artificial hip joint: Secondary | ICD-10-CM | POA: Insufficient documentation

## 2011-02-08 LAB — URINE MICROSCOPIC-ADD ON

## 2011-02-08 LAB — BASIC METABOLIC PANEL
CO2: 30 mEq/L (ref 19–32)
Calcium: 10.4 mg/dL (ref 8.4–10.5)
Chloride: 103 mEq/L (ref 96–112)
GFR calc Af Amer: >60 mL/min (ref >60)
Glucose, Bld: 91 mg/dL (ref 70–99)
Sodium: 142 mEq/L (ref 135–145)

## 2011-02-08 LAB — CBC
Hemoglobin: 12.6 g/dL (ref 12.0–15.0)
MCH: 26.6 pg (ref 26.0–34.0)
MCHC: 31.3 g/dL (ref 30.0–36.0)
MCV: 85.2 fL (ref 78.0–100.0)
RBC: 4.73 MIL/uL (ref 3.87–5.11)

## 2011-02-08 LAB — DIFFERENTIAL
Basophils Relative: 0 % (ref 0–1)
Eosinophils Absolute: 0.2 10*3/uL (ref 0.0–0.7)
Lymphs Abs: 1.6 10*3/uL (ref 0.7–4.0)
Monocytes Absolute: 0.6 10*3/uL (ref 0.1–1.0)
Monocytes Relative: 10 % (ref 3–12)
Neutro Abs: 3.2 10*3/uL (ref 1.7–7.7)

## 2011-02-08 LAB — PROTIME-INR: Prothrombin Time: 22.8 seconds — ABNORMAL HIGH (ref 11.6–15.2)

## 2011-02-08 LAB — URINALYSIS, ROUTINE W REFLEX MICROSCOPIC
Bilirubin Urine: NEGATIVE
Glucose, UA: NEGATIVE mg/dL
Specific Gravity, Urine: 1.013 (ref 1.005–1.030)
pH: 5 (ref 5.0–8.0)

## 2011-02-08 LAB — SURGICAL PCR SCREEN
MRSA, PCR: NEGATIVE
Staphylococcus aureus: NEGATIVE

## 2011-02-08 NOTE — H&P (Signed)
[name], [name]               ACCOUNT NO.:  [id number]  MEDICAL RECORD NO.:  [id number]           PATIENT TYPE:  I  LOCATION:  1S                           FACILITY:  [facility]  PHYSICIAN:  Gail Frankel. Charlann [name], M.D.  DATE OF BIRTH:  [birth year]  DATE OF ADMISSION: DATE OF DISCHARGE:                             HISTORY & PHYSICAL   ADMISSION DIAGNOSIS:  Failed left total hip arthroplasty.  HISTORY OF PRESENT ILLNESS:  This is a 65 year old lady with a history of total hip arthroplasty with revision hip left surgery with multiple dislocations.  After discussion of problem, treatments, benefits, risks, and options, the patient is now scheduled for revision total hip arthroplasty with ring on her left hip.  Surgery risks, benefits, and aftercare were discussed in detail with the patient.  Questions invited and answered.  Note that the patient has had DVT in the past and also had PE.  She has a filter in place.  She is on chronic Coumadin therapy and will need Lovenox bridging Coumadin postoperatively.  The patient is not a candidate for transanaemic acid and should not receive it in the preop holding.  PAST MEDICAL HISTORY:  Drug Allergies:  None.  CURRENT MEDICATIONS: 1. Coumadin 7.5 mg Tuesday through Sunday, 5 mg Monday. 2. Hydrocodone 5/325 one q.6 h p.r.n. pain. 3. Gabapentin 600 mg p.o. t.i.d. 4. Lasix 20 mg daily. 5. Synthroid 75 mcg 1 daily. 6. TriCor 145 mg 1 daily. 7. Flexeril 10 mg 1 t.i.d. 8. Atenolol 25 mg daily. 9. Omeprazole 20 mg daily. 10.Alprazolam 0.5 mg b.i.d. 11.Silvadene 10 mg at bedtime. 12.Flector patches p.r.n. 13.Lidoderm patches p.r.n. 14.Multivitamin. 15.Calcium.  PREVIOUS SURGERIES:  Include 14 back surgeries for degenerative disk disease, carpal tunnel release both wrists, cholecystectomy, seven surgeries on the right knee including right total knee arthroplasty and revision total knee arthroplasty, 1 arthroscopy on the left knee, multiple hip  replacements on the left hip, right shoulder rotator cuff repair and then subsequent right shoulder replacement, tonsillectomy, tubal ligation, partial hysterectomy, mass removed from her uterus which was benign, and a heart catheterization which was normal.  SOCIAL HISTORY:  The patient is married.  She lives at home.  She does not smoke and does not drink.  FAMILY HISTORY:  Positive for stroke, coronary artery disease, and COPD.  REVIEW OF SYSTEMS:  CENTRAL NERVOUS SYSTEM:  Positive for impaired vision, otherwise negative.  PULMONARY:  Negative for shortness breath, PND, or orthopnea.  Positive for PE status post DVT.  CARDIOVASCULAR: Negative for chest pain or palpitation.  GI:  Positive for gallbladder disease in the past.  GU:  Positive for history of kidney stones. MUSCULOSKELETAL:  Positive as in HPI.  PHYSICAL EXAMINATION:  VITAL SIGNS:  BP 140/98, respirations 18, pulse 78 and regular. GENERAL APPEARANCE:  This is a well-developed, well-nourished lady, in no acute distress. HEENT:  Head normocephalic.  Nose patent.  Ears patent.  Pupils equal, round, and reactive to light.  Throat without injection. NECK:  Supple without adenopathy.  Carotids are 2+ without bruit. CHEST:  Clear to auscultation.  No rales or rhonchi.  Respirations 18. HEART:  Regular rate and rhythm at 70 beats per minute without murmur. ABDOMEN:  Soft with active bowel sounds.  No masses, organomegaly. NEUROLOGIC:  The patient is alert and oriented to time, place, and person.  Cranial nerves II-XII grossly intact. EXTREMITIES:  Shows the right knee status post total knee arthroplasty with about 5 degrees from full extension, further flexion to 95 degrees left hip.  She is wearing a knee immobilizer.  She has had multiple dislocations, has marked with limited motion secondary to anxiety of repeat dislocation.  Neurovascular status intact.  IMPRESSION:  Left hip, failed total hip arthroplasty.  PLAN:   Revision total hip, left hip.     Jaquelyn Bitter. Chabon, P.A.   ______________________________ [name], M.D.    SJC/MEDQ  D:  02/03/2011  T:  02/04/2011  Job:  [id number]  Electronically Signed by Jodene Nam P.A. on 02/08/2011 11:40:42 AM Electronically Signed by Durene Romans M.D. on 02/08/2011 11:56:28 AM

## 2011-02-16 ENCOUNTER — Inpatient Hospital Stay (HOSPITAL_COMMUNITY): Payer: Medicare Other

## 2011-02-16 ENCOUNTER — Inpatient Hospital Stay (HOSPITAL_COMMUNITY)
Admission: RE | Admit: 2011-02-16 | Discharge: 2011-02-18 | DRG: 468 | Disposition: A | Discharge status: Home Health | Payer: Medicare Other | Source: Ambulatory Visit | Attending: Orthopedic Surgery | Admitting: Orthopedic Surgery

## 2011-02-16 DIAGNOSIS — Z96659 Presence of unspecified artificial knee joint: Secondary | ICD-10-CM

## 2011-02-16 DIAGNOSIS — Z86711 Personal history of pulmonary embolism: Secondary | ICD-10-CM

## 2011-02-16 DIAGNOSIS — Z823 Family history of stroke: Secondary | ICD-10-CM

## 2011-02-16 DIAGNOSIS — Z7901 Long term (current) use of anticoagulants: Secondary | ICD-10-CM

## 2011-02-16 DIAGNOSIS — T84099A Other mechanical complication of unspecified internal joint prosthesis, initial encounter: Principal | ICD-10-CM | POA: Diagnosis present

## 2011-02-16 DIAGNOSIS — Z8249 Family history of ischemic heart disease and other diseases of the circulatory system: Secondary | ICD-10-CM

## 2011-02-16 DIAGNOSIS — Z96649 Presence of unspecified artificial hip joint: Secondary | ICD-10-CM

## 2011-02-16 DIAGNOSIS — Z01812 Encounter for preprocedural laboratory examination: Secondary | ICD-10-CM

## 2011-02-16 DIAGNOSIS — Y849 Medical procedure, unspecified as the cause of abnormal reaction of the patient, or of later complication, without mention of misadventure at the time of the procedure: Secondary | ICD-10-CM | POA: Diagnosis present

## 2011-02-16 DIAGNOSIS — E669 Obesity, unspecified: Secondary | ICD-10-CM | POA: Diagnosis present

## 2011-02-16 DIAGNOSIS — Z79899 Other long term (current) drug therapy: Secondary | ICD-10-CM

## 2011-02-16 DIAGNOSIS — Z86718 Personal history of other venous thrombosis and embolism: Secondary | ICD-10-CM

## 2011-02-16 DIAGNOSIS — Y92009 Unspecified place in unspecified non-institutional (private) residence as the place of occurrence of the external cause: Secondary | ICD-10-CM

## 2011-02-16 DIAGNOSIS — Z96619 Presence of unspecified artificial shoulder joint: Secondary | ICD-10-CM

## 2011-02-16 DIAGNOSIS — I10 Essential (primary) hypertension: Secondary | ICD-10-CM | POA: Diagnosis present

## 2011-02-16 LAB — PROTIME-INR: Prothrombin Time: 13.4 seconds (ref 11.6–15.2)

## 2011-02-17 LAB — BASIC METABOLIC PANEL
BUN: 11 mg/dL (ref 6–23)
CO2: 27 mEq/L (ref 19–32)
Creatinine, Ser: 0.78 mg/dL (ref 0.4–1.2)
GFR calc Af Amer: >60 mL/min (ref >60)
GFR calc non Af Amer: >60 mL/min (ref >60)
Glucose, Bld: 96 mg/dL (ref 70–99)
Sodium: 137 mEq/L (ref 135–145)

## 2011-02-17 LAB — CBC
HCT: 33.1 % — ABNORMAL LOW (ref 36.0–46.0)
MCV: 86.2 fL (ref 78.0–100.0)
Platelets: 232 10*3/uL (ref 150–400)
RBC: 3.84 MIL/uL — ABNORMAL LOW (ref 3.87–5.11)
WBC: 5.9 10*3/uL (ref 4.0–10.5)

## 2011-02-18 LAB — BASIC METABOLIC PANEL
BUN: 11 mg/dL (ref 6–23)
CO2: 28 mEq/L (ref 19–32)
Calcium: 9.4 mg/dL (ref 8.4–10.5)
Chloride: 104 mEq/L (ref 96–112)
Creatinine, Ser: 0.76 mg/dL (ref 0.4–1.2)
GFR calc Af Amer: >60 mL/min (ref >60)

## 2011-02-18 LAB — CBC
HCT: 32.2 % — ABNORMAL LOW (ref 36.0–46.0)
MCHC: 31.1 g/dL (ref 30.0–36.0)
MCV: 87.3 fL (ref 78.0–100.0)
Platelets: 241 10*3/uL (ref 150–400)
RDW: 15.2 % (ref 11.5–15.5)
WBC: 5.8 10*3/uL (ref 4.0–10.5)

## 2011-02-19 NOTE — Op Note (Signed)
[name], [name]               ACCOUNT NO.:  [id number]  MEDICAL RECORD NO.:  [id number]           PATIENT TYPE:  I  LOCATION:  1605                         FACILITY:  [facility]  PHYSICIAN:  Madlyn Frankel. Charlann Boxer, M.D.  DATE OF BIRTH:  Mar 06, 1946  DATE OF PROCEDURE:  02/16/2011 DATE OF DISCHARGE:                              OPERATIVE REPORT   PREOPERATIVE DIAGNOSIS:  Status post left total hip surgery with, at this point, at least 2 different dislocations.  POSTOPERATIVE DIAGNOSIS:  Recurrent instability, left hip.  PROCEDURE:  Revision left hip surgery with placement of a constrained acetabular liner, a 32 +9 ball.  There was a 32 constrained neutral +4 liner from DePuy.  SURGEON:  Madlyn Frankel. Charlann Boxer, M.D.  ASSISTANT:  Jaquelyn Bitter. Chabon, P.A.  ANESTHESIA:  General.  SPECIMENS:  None as the patient was noted to have clear synovial fluid present.  No signs of infection.  DRAINS:  One Hemovac.  COMPLICATIONS:  None.  INDICATIONS FOR THE PROCEDURE:  [name] is a 65 year old patient of mine.  She has had a history of a revision left hip surgery for acetabular revision.  She did have episodes of recurrent dislocation x2 at this point with a significant fear of the ball popping out.  After reviewing with her her risks, we discussed the pros and cons and benefits of proceeding with any further revision surgery.  After discussing this at length and the options available, she wished to proceed with a constrained liner due to the concerns about a popping out again.  She at this point just wanted to have a period of time where there was no dislocations or at least chance of it.  We discussed the fact that transmission and forces made ultimately resulted in loosening of the acetabular shell requiring revision surgery down the road, but there was no guarantee for distant acetabular revision at this point.  After reviewing these risks and benefits, she wished to proceed with surgery  and consent was obtained for the above.  PROCEDURE IN DETAIL:  The patient was brought to operative theater. Once adequate anesthesia and preoperative antibiotics, 2 g Ancef administered.  She was positioned into the right lateral decubitus position with left side up.  The left lower extremity was then prepped and draped in a sterile fashion.  A time-out was performed identifying the patient, planned procedure, and extremity.  The patient's old incision was excised.  Sharp dissection was carried down to the iliotibial band and gluteal fascia and soft-tissue planes created.  At this point, this fascial plane was incised posteriorly.  I split the gluteus maximus muscle and got down to the pseudocapsule posteriorly. On the posterior border of the gluteus medius, I made a capsular incision encountering clear synovial fluid.  During the portion of this case, the nail was carried out exposing the posterior aspect of the hip joint.  Once the hip was exposed posteriorly, I dislocated the hip and removed the old femoral head.  Prior to this, I did assess the patient's range of motion. Unfortunately, it is very hard for me to determine why this hip dislocated as  she tolerated hip range of motion, hip flexion, and internal rotation to about 70 to 80 degrees without evidence of subluxation, and ended up having to use a bone hook to remove the femoral head.  It is possible that there was some leveraging from her habitus of her abdomen down to the thighs and added to a fulcrum at this in a flexed position; however, the combined anteversion and her overall range of motion on the operative table appeared to be very stable and good.  At this point, the acetabular liner was removed.  Following debridement of the rims for complete visualization, we chose to use a 32 +4 neutral constrained liner.  It was opened and then subsequently placed and wound impacted into position with good visualized secure  fit.  At this point, I initially chose a 32 +9 Delta ceramic ball.  This was impacted onto clean and dry trunnion to help provide length as she fell a little bit short on this side, though her hip anatomy shows that she is longer on this left side than the right because of her multiple surgeries on the right knee and due to some ligament laxity she is longer on this right side.  In an attempt to reduce the hip, we noted that the posterior wall of the constrained liner fracture had bent into the joint.  At this point, we dislocated the current system, removed that femoral head as it had gotten scratched up a bit and then removed this new constrained liner.  A second liner was opened and impacted into position without difficulty.  The 32 +9 metal A sphere ball at this point chosen and with the hip flexed and abduction applied across the proximal femur, we were able to get the hip ball reduced.  It subsided within the acetabular shell with a palpable recess.  At the time of placing the ball, we did place the beveled edge ring towards the cup as per protocol.  The ring was then slid over the acetabular liner and impacted into position and seated circumferentially, visualized at least two-thirds of way across and around.  At this point, the hip was irrigated as it had been throughout the case. I reapproximated the posterior pseudocapsule to itself using #1 Vicryl. We placed a medium Hemovac drain deep.  I then reapproximated the iliotibial band and gluteal fascia using #1 Vicryl.  The remaining of the wound was closed with 2-0 Vicryl and a running 3-0 Monocryl.  The hip was cleaned, dried, and dressed sterilely with Dermabond and Aquacel dressing.  Drain site dressed separately.  She was brought to the recovery in a stable condition tolerating the procedure well.     Madlyn Frankel Charlann Boxer, M.D.     MDO/MEDQ  D:  02/16/2011  T:  02/17/2011  Job:  [id number]  Electronically Signed by Durene Romans M.D. on 02/19/2011 08:14:05 AM

## 2011-03-02 NOTE — Discharge Summary (Signed)
NAMEYANCI, BACHTELL NO.:  [id number]   MEDICAL RECORD NO.:  [id number]          PATIENT TYPE:  INP   LOCATION:  6523                         FACILITY:  [facility]   PHYSICIAN:  Gwen Pounds, MD       DATE OF BIRTH:  11/28/1945   DATE OF ADMISSION:  01/16/2008  DATE OF DISCHARGE:  01/17/2008                               DISCHARGE SUMMARY   PRIMARY CARE PHYSICIAN:  Gwen Pounds, MD   CARDIOLOGIST:  Francisca December, M.D.   ORTHOPEDIC SURGEON:  Madlyn Frankel. Charlann Boxer, M.D.   DISCHARGE DIAGNOSES:  1. Chest pain with negative cardiac catheterization.  2. Hypertension.  3. Chronic pain.  4. Hypothyroidism.  5. Osteoarthritis of the knee, hip, and back.  6. History of 13 back operations.  7. Obesity.  8. History of pulmonary emboli and inferior vena cava filter on      chronic Coumadin.  9. Gastroesophageal reflux disease.  10.Anxiety.  11.Chronic renal insufficiency.  12.Familial hypocalciuric hypercalcemia with normal calcium at this      point.  13.Mild anemia.  14.Hypertriglyceridemia and hyperlipidemia.   DISCHARGE MEDICATIONS:  1. Lasix 20 mg 3 tablets in the morning and 1 tablet at 3 p.m.  2. Cyclobenzaprine 10 mg 1 p.o. t.i.d. as needed.  3. Atenolol 50 mg p.o. daily.  4. Omeprazole 20 mg 1-2 tablets in the morning.  5. Vicodin 7.5/650 mg every 6-8  hours as needed for pain.  6. TriCor 145 mg p.o. daily.  7. Coumadin 5 mg daily at 6 p.m.  8. Alprazolam 0.5 mg 1 p.o. b.i.d.  9. Gabapentin 600 mg 1 tablet p.o. t.i.d.  10.Synthroid 75 mcg p.o. daily.  11.Indomethacin 50 mg p.o. b.i.d.  12.Benicar 20 mg p.o. daily.  13.Calcium plus D twice a day.  14.Multivitamin 1 p.o. daily.  15.Ambien 10 p.o. nightly.  16.Lidoderm patch 1-3 patches on 12 hours and off 12 hours.  17.Iron 125 mg p.o. daily.   PAST MEDICAL HISTORY:  1. History of knee arthroplasty.  2. History of severe constipation with large bowel impaction      perioperatively.  3. Mild aortic valve  thickening with very mild aortic valve stenosis.  4. Partial hysterectomy.  5. Total knee replacement in January 2006.  6. Insomnia.  7. History of right neck operation.  8. Status post tonsil and adenoidectomy at the age of 65 year old.  9. History of cholecystectomy.   DISCHARGE PROCEDURES:  Include coronary catheterization on January 20, 2008, revealed normal LV function with EF 65%, normal and clean  coronaries, and renal arteries are patent.   IMPRESSION:  Noncardiac chest pain.   HISTORY OF PRESENT ILLNESS:  Briefly, Ms. Masie Bermingham is a 65 year old  female with multiple medial problems and cardiac risk factors that  include hyperlipidemia, hypertriglyceridemia, hypertension, and family  history who presented to medical attention on January 16, 2008, after  developing increasing blood pressure, increasing chest pain, increasing  anxiety, and increasing arm issues at around 4:00-4:30 in the afternoon  on January 16, 2008.  She called the EMS and was transported to the  ED  with 10/10 severe chest pain.  She took aspirin and in the ER was  givenEKG, labs, and chest x-ray,  all were unremarkable.  She was also  given nitroglycerin and Dr. Hillery Aldo was called for inpatient admission.  Of note, she had seen me on January 12, 2008, for a preoperative workup  for Dr. Durene Romans who is going to do a hip replacement and a neuroma  removal on January 30, 2008.   HOSPITAL COURSE:  Dr. Hillery Aldo admitted Ms. Kennieth Rad on January 16, 2008, for severe chest pain and symptoms that sounded typical.  EKG,  chest x-ray, and labs were unremarkable.  She subsequently ruled out for  myocardial infarction. Cardiology was consulted.  The patient was left  on nitroglycerin drip which helped alleviate her pain and the following  morning on rounds I saw her and she was at her baseline and blood  pressure was stable on 111/67, but she was on the nitroglycerin drip.  Again physical exam was unremarkable and  I was pushing for heart  catheterization.  She was seen by Dr. Amil Amen; his report says since she  is preoperative for total hip replacement and felt typical pain and with  cardiac risk factors, we will proceed with heart catheterization with  potential percutaneous intervention if anything was found.  Heart  catheterization was done later that afternoon and showed normal LV  function, EF 65%, normal coronaries.  Abdominal aorta showed mild  atherosclerosis, patent left and right renal arteries, selective renal  patent at the right renal artery.  Therefore, this was felt to be  noncardiac chest pain.  Dr. Amil Amen asked me to just make sure that  I  continue with risk factor modification and to treat any underlying GERD  with PPI.  Angio-Seal was done.  I came  by to see her later that  afternoon.  She had already been up walking.  She is already  back to  doing well.  She is currently chest pain free.  We reviewed all of her  labs.  CK and troponin-I were normal.  Chem-8 was normal with a  creatinine of 1.26 on that morning.  INR was 1.9 and she was on Lovenox.  Her hemoglobin was 11.6.  The rest of her blood counts were fine.  Her  last set of vitals showed 97.2, pulse is 72, respiratory rate is 20,  blood pressure 152/80, and sating 98% on room air.  She is deemed  medically ready and stable for discharge and we will continue to push  the Benicar and Lasix as an outpatient to try and get better blood  pressure control leading up to her surgery.  At this current time, there  is no further workup that needs to be done prior to her surgery and  instead of dictating a separate preoperative clearance letter, I will  let this serve as a document considering this is actually more timely  and  includes her current cardiac catheterization report.   I went over all the possible etiologies of what her noncardiac chest  pain was and she knows enough to alert me should this return and again I  do  not believe that there is any underlying pulmonary emboli due to the  fact that her INR is pretty appropriate and she has an IVC filter in and  there is no increasing lower extremity edema.  I believe this is  probably reflux on top of anxiety that led to  her typical chest pain,  but clearly noncardiac.      Gwen Pounds, MD  Electronically Signed     JMR/MEDQ  D:  01/17/2008  T:  01/18/2008  Job:  [id number]   cc:   Francisca December, M.D.  Madlyn Frankel Charlann Boxer, M.D.

## 2011-03-02 NOTE — Assessment & Plan Note (Signed)
[facility]   NAME:  VIRGA, HALTIWANGER               ACCOUNT NO.:  [id number]   MEDICAL RECORD NO.:  [id number]      DATE OF BIRTH:  1945-11-26   PHYSICIAN:  Bimal R. Sherryll Burger, MD       VISIT DATE:  01/16/2008                                   OFFICE VISIT   REASON FOR CONSULTATION:  Chest pain.   HISTORY OF PRESENT ILLNESS:  This is a 65 year old woman with a history  of hypertension, multiple orthopedic problems and procedures as well as  moderate obesity, who comes in with onset of chest pain around 2:30 this  afternoon while at rest.  She states that she had a very sleepless night  last night secondary to right knee pain.  She took some NSAIDS which  relieved her pain and she slept well until about 10:30.  She went on  about her days business and then around 2 o'clock had the sudden onset  of midsternal and left sided chest pain associated with left arm  numbness and left jaw numbness, but no shortness of breath, nausea,  vomiting, or diaphoresis.  She describes the pain eventually as a 10/10  and as a pressure, as a result called EMS.  She took an aspirin 81 mg  dose while at home and en route on the ambulance she was given  nitroglycerin which brought her pain down to about a 6-7 out of 10.  Here in the emergency department, her pain is still persistent,  although, it is only 5/10 in her left arm and left jaw.  Symptoms have  largely resolved.  She has never had symptoms like this in the past.  She denies any palpitations, focal weakness, vision loss, orthopnea,  PND, or increase in her lower extremity edema.  She states that she has  knee surgery scheduled for January 30, 2008, and is currently still taking  her Coumadin and the plan had been to bridge her prior to that  procedure.  She states that her last INR was 2.1 and she has been  compliant with her Coumadin.   REVIEW OF SYSTEMS:  As above in the HPI.  Remaining 18-point review of  systems is negative.   PAST MEDICAL HISTORY:  1. Hypertension.  2. Hypothyroidism.  3. Anemia.  4. Osteoarthritis.  5. Hyperlipidemia.  6. History of 11 back surgeries.  7. Cholecystectomy.  8. Tonsillectomy.  9. Total knee replacements in 2005 and 2006.  10.Carpal tunnel release procedures for both hands.  11.Status post left hip replacement.  12.History of DVT in February of 2008 with an IVC filter placed and      currently on Coumadin.   SOCIAL HISTORY:  The patient lives in [place] with her husband.  She  has been on disability since 1993.  She was a former bookkeeper.  She  denies any alcohol, drug, or tobacco abuse.   FAMILY HISTORY:  Her mother died at age 80 from what she describes as  heart disease.  It is unclear whether this was an MI or the patient's  mother did have heart disease that led to her ultimate disease.  Her  father died at age 74 and was diabetic and he died of unknown causes.   ALLERGIES:  The patient states that she has none, but her electronic  medical chart says that she has a reaction to MORPHINE, DEMEROL, and  DILAUDID.   MEDICATIONS:  1. Synthroid 75 mcg daily.  2. Flexeril 10 mg p.r.n.  3. Alprazolam 0.5 mg b.i.d.  4. Calcium plus vitamin D 600 mg b.i.d.  5. Multivitamin daily.  6. Vitamin B complex daily.  7. Lasix 60 mg daily.  8. Ambien 10 mg q.h.s.  9. Atenolol 25 mg daily.  10.Indomethacin 50 mg daily.  11.Neurontin 600 mg t.i.d.  12.TriCor 145 mg daily.  13.Vicodin p.r.n.  14.Omeprazole 20 mg daily.  15.Iron supplements daily.  16.Benicar 20 mg daily.   PHYSICAL EXAMINATION:  VITAL SIGNS:  Temperature is 97.5, pulse 68,  respiratory rate 18, blood pressure 160/99, and O2 saturations 96% on 2  liters nasal cannula.  GENERAL:  She is alert and oriented x3 in no acute distress, accompanied  by her husband and son.  HEENT:  Normocephalic and atraumatic.  Oropharynx is clear.  Sclerae  anicteric.  NECK:  Supple with no lymphadenopathy.  JVP is flat  with no carotid  bruits.  2+ carotid impulses symmetric bilaterally.  LUNGS:  Clear to auscultation bilaterally with no wheezes, rhonchi, or  rales.  CARDIOVASCULAR:  Regular rate and rhythm with normal S1 and S2 without  any murmurs, rubs, or gallops.  There was no tenderness to palpation  along her sternum or left anterior chest.  ABDOMEN:  Truncal obesity.  Positive bowel sounds.  Soft, nontender, and  nondistended without any palpable masses.  EXTREMITIES:  2+ radial and posterior tibialis pulses, symmetric  bilaterally without any lower extremity edema, clubbing, or cyanosis.  NEUROLOGY:  Cranial nerves II-XII grossly intact.  She is moving all  extremities spontaneously.   LABORATORY DATA:  Chest x-ray shows an elevated right hemidiaphragm, but  normal cardiac silhouette without any infiltrate, edema, or effusion.   12-lead EKG shows a normal sinus rhythm at a rate of 72.  Left axis  deviation.  Normal intervals.  No ST or T wave changes.  No Q's and no  significant changes from a prior EKG dated January 12, 2008.   CBC was within normal limits.  Chem-7 shows a creatinine of 1.4,  potassium of 4.6, glucose 123.  First set of cardiac markers were  negative and INR was 1.8.   ASSESSMENT:  1. Chest pain, rule out ACS/obstructive coronary disease.  2. Status post multiple orthopedic procedures.  3. History of deep venous thrombosis with an IVC filter in place and      on Coumadin with a slightly subtherapeutic INR.  4. Hypertension.   RECOMMENDATIONS:  We agree that the patient should be admitted to  telemetry and should be ruled out by cardiac markers x3.  We continued  the aspirin and beta blocker as you have prescribed.  If the chest pain  resolves, it would be reasonable to start the patient on a nitroglycerin  drip to further relieve her chest discomfort as long as her blood  pressure will tolerate it.  If there are any EKG changes associated with  ischemia or her markers  do return positive, please do not hesitate to  call us.  At this point in time I would recommend holding the Coumadin  and starting Lovenox given if she is having acute coronary syndrome or  unstable angina, this would be cardio protective at this point and has  good data to support its use.  Jonny Ruiz  Franco Nones, M.D. will see the  patient in the morning and evaluate for stress versus cardiac  catheterization and make further recommendations in terms of management  of this patient.   Thank you for this consult and please do not hesitate to call us with  any questions.      Bimal R. Sherryll Burger, MD  Electronically Signed     BRS/MEDQ  D:  01/16/2008  T:  01/16/2008  Job:  [id number]

## 2011-03-02 NOTE — Op Note (Signed)
NAMEELLIN, FITZGIBBONS               ACCOUNT NO.:  [id number]   MEDICAL RECORD NO.:  [id number]          PATIENT TYPE:  INP   LOCATION:  1613                         FACILITY:  [facility]   PHYSICIAN:  Madlyn Frankel. Charlann Boxer, M.D.  DATE OF BIRTH:  [birth year]   DATE OF PROCEDURE:  04/11/2008  DATE OF DISCHARGE:                               OPERATIVE REPORT   PREOPERATIVE DIAGNOSIS:  Right knee periprosthetic patella fracture  after multiple falls.   POSTOPERATIVE DIAGNOSIS:  Right knee periprosthetic patella fracture  after multiple falls.  Findings also included a significant lateral  tilting of the patella.   PROCEDURE:  1. Revision, right patella, with excision of patella fracture      fragment.  Importantly, I was unable to resurface the patella but      debrided it, including osteotomizing the inferior pole to a more      stable neutral level.  2. Medial retinacular plication in order to realign the patella.   SURGEON:  Madlyn Frankel. Charlann Boxer, M.D.   ASSISTANT:  Surgical technique.   ANESTHESIA:  General.   BLOOD LOSS:  50 mL.   TOURNIQUET TIME:  45 minutes at 300 mmHg.   DRAINS:  x1.   COMPLICATIONS:  None.   INDICATIONS FOR PROCEDURE:  [name] is a 65 year old female known to  me from previous right knee revision surgery.  She had persistent  problems with knee instability and falling.  She had multiple episodes  of falling.  She presented to the office after a 3-day history of the  most recent fall with inability to bear weight,  inability to extend her  knee without significant pain.  Radiographs including sunrise views in  the office indicated an acute fracture through the lateral facet of the  patella.   She was subsequently admitted for surgical treatment.  I reviewed with  her the risks and benefits, the findings that were there, and the  planned procedures.  Consent was obtained for the procedure at hand.   PROCEDURE IN DETAIL:  The patient was brought to the operative  theater.  Once adequate anesthesia, preoperative antibiotics, and Ancef were  administered, the patient was positioned supine with a proximal thigh  tourniquet in place.  We prescrubbed the right lower extremity and then  prepped and draped the leg.  The midline incision was then made.  I made  an incision to make a median arthrotomy despite the lateral fracture  fragment.  I wanted do this because I wanted to excise a portion of her  extensor mechanism and plicate these tissues in an effort to try to  realign the patella.  This was done at the end of the case.  Following  arthrotomy and release of a large hemarthrosis, I put a pin into the  proximal tibia to prevent tubercle avulsion.  I was able to debride the  patella enough to get it everted.  The fracture fragment was identified.  At this point laterally I went ahead and performed a lateral release to  the lateral retinacular tissues.  I did this to excise this fragment.  Once I shelled the bone fragment out from the tendon to the patella, the  patella component was noted be loose and this was removed.  A large  inferior prominence of the patella in the area where the previous button  had been placed was significantly osteonecrotic and not unable to  support any new bone and the remaining inferior nubbin was very  prominent but small enough that it was unable to support anything, and  in the inferior aspect of the patella on the radiographs, would not have  made any contact with the trochlear groove.  Given this finding, I went  ahead and did an osteotomy of this inferior pole, smoothing it out to a  more stable level than the obstruction.   At this point it was decided that no patella revision would be carried  out in terms of resurfacing.  The bone quality was poor enough and small  enough at this point to be inadequate to support this.  We irrigated the  knee out with 1500 mL of normal saline solution.   At this point attention  then was carried out to the medial plication.  I  excised probably a centimeter of this medial retinacular tissue and then  used a couple of #1 Ethibond sutures in order to provide permanent  stability.  I then supported this with #1 Vicryl and reapproximated the  entire medial arthrotomy.   I then reapproximated the lateral retinaculum.  This did not  significantly affect the alignment of the patella at this point.  At  this point we had a near watertight closure of his extensor mechanism  that was done without difficulty.  I reirrigated the superficial layer  at this point and reapproximated the subcu layer with 2-0 Vicryl.  I did  place a medium Hemovac drain and placed it in the suprapatellar pouch.  The skin was closed with regular staples.  The patient was then brought  to the recovery room, extubated in stable condition.  I placed a sterile  bulky Jones dressing and a knee immobilizer on the right knee, which I  will plan to keep for some time.      Madlyn Frankel Charlann Boxer, M.D.  Electronically Signed     MDO/MEDQ  D:  04/11/2008  T:  04/11/2008  Job:  [id number]

## 2011-03-02 NOTE — H&P (Signed)
[name], [name]               ACCOUNT NO.:  [id number]   MEDICAL RECORD NO.:  [id number]         PATIENT TYPE:  LINP   LOCATION:                               FACILITY:  [facility]   PHYSICIAN:  Madlyn Frankel. Charlann Boxer, M.D.  DATE OF BIRTH:  10-11-1946   DATE OF ADMISSION:  04/10/2008  DATE OF DISCHARGE:                              HISTORY & PHYSICAL   REASON FOR ADMISSION/ADMITTING DIAGNOSIS:  Right patella fracture.   SECONDARY DIAGNOSES:  Osteoarthritis, hypertension, hypothyroidism,  anemia, degenerative disk disease, rotator cuff tendinitis.   HISTORY OF PRESENT ILLNESS:  A 65 year old female with a history of  right knee pain since last Friday with a history of right total knee  replacement in the past.  She was seen in our office today, where x-ray  revealed a right patella fracture.  Due to the significant amount of  pain or inability to walk without a significant amount of pain, she was  to be admitted from our office for pain control with surgery scheduled  for the 25th.   PAST MEDICAL HISTORY:  See admitting diagnosis.   PAST SURGICAL HISTORY:  Right total knee replacement, right neuroma  excision, left total hip arthroplasty, 11 back surgeries,  cholecystectomy, tonsillectomy, tubal ligation, partial hysterectomy,  carpal tunnel release both hands, left  hip replacement.   SOCIAL HISTORY:  The patient is married to husband, [name].  She is a  nonsmoker.   FAMILY HISTORY:  Heart disease, hypertension, diabetes, cancer, stroke,  arthritis.   DRUG ALLERGIES:  No known drug allergies.   MEDICATIONS:  1. Hydrocodone 7.5/650 one p.o. q. 6-8 h. p.r.n. pain.  2. Gabapentin 600 mg 1 p.o. t.i.d.  3. Indomethacin 50 mg 1 p.o. b.i.d.  4. Flexeril 10 mg 1 p.o. t.i.d.  5. Coumadin 5 mg p.o. daily.  6. Alprazolam 0.5 mg 1 p.o. b.i.d.  7. Atenolol 5 mg 1 p.o. daily.  8. Benicar 20 mg 1 p.o. daily.  9. Omeprazole 20 mg 1 p.o. daily to twice daily.  10.Synthroid 75 mcg 1 p.o.  daily.  11.Tricor 145 mg 1 p.o. daily.  12.Lasix 60 mg each morning.  13.Iron 325 mg 1 p.o. daily as tolerated.  14.Colace 100 mg p.o. b.i.d.  15.Glucosamine chondroitin MSM b.i.d.  16.MiraLax 17 grams p.o. daily.  17.Calcium 600 plus vitamin D 1 p.o. b.i.d.  18.Lidoderm patches 1 to 3 as needed.  19.Spectravite multivitamin p.o. daily.   REVIEW OF SYSTEMS:  MUSCULOSKELETAL:  Has significant right shoulder  pain with a known history of rotator cuff pathology.  Otherwise see HPI.   PHYSICAL EXAMINATION:  VITAL SIGNS:  Pulse 72, respirations 18, blood  pressure 160/100.  GENERAL:  Awake, alert and oriented, well-developed, well-nourished, in  no acute distress.  NECK:  Supple.  No carotid bruits.  CHEST:  Lungs are clear to auscultation bilaterally.  BREASTS:  Deferred.  HEART:  Regular and rhythm.  S1 and S2 distinct.  ABDOMEN:  Soft, nontender.  Bowel sounds present.  GENITOURINARY:  Deferred.  EXTREMITIES:  Right knee is painful to touch and with range of motion.  Her  right upper extremity is extremely painful with range of motion.  SKIN:  Her right lower extremity:  No signs of cellulitis or infection.  NEUROLOGIC:  Intact distal sensibilities.   Labs pending admission.   IMPRESSION:  Right patella fracture.   PLAN OF ACTION:  Revision, right patella, in the setting of a right  total knee arthroplasty with potential resurfacing by Dr. Charlann Boxer.  Risks  and complications were discussed.     ______________________________  Yetta Glassman [name], [place]      Madlyn Frankel. Charlann Boxer, M.D.  Electronically Signed    BLM/MEDQ  D:  04/10/2008  T:  04/10/2008  Job:  [id number]   cc:   Gwen Pounds, MD  Fax: [phone]   Stefani Dama, M.D.  Fax: [phone]   Francisca December, M.D.  Fax: [phone]

## 2011-03-02 NOTE — Op Note (Signed)
NAMEMCKELL, RIECKE               ACCOUNT NO.:  [id number]   MEDICAL RECORD NO.:  [id number]          PATIENT TYPE:  INP   LOCATION:  3037                         FACILITY:  [facility]   PHYSICIAN:  Stefani Dama, M.D.  DATE OF BIRTH:  04-20-1946   DATE OF PROCEDURE:  08/22/2008  DATE OF DISCHARGE:                               OPERATIVE REPORT   PREOPERATIVE DIAGNOSIS:  Cervical spondylolisthesis and stenosis with  myelopathy C3-C4.   POSTOPERATIVE DIAGNOSIS:  Cervical spondylolisthesis and stenosis with  myelopathy C3-C4.   PROCEDURE:  Anterior cervical decompression C3-C4 arthrodesis with  structural allograft and Alphatec plate fixation.   SURGEON:  Stefani Dama, MD   FIRST ASSISTANT:  Dr. Lovell Sheehan.   ANESTHESIA:  General endotracheal.   INDICATIONS:  [name] is a 65 year old individual who has had  significant difficulties with neck, shoulder and arm pain.  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  She was advised regarding surgical decompression at C3-C4 where she had  the spondylolisthesis above previously fuse level at C4-C5 with Synthes  hardware in place.   PROCEDURE:  The patient was brought to the operating room supine on the  stretcher.  After smooth induction of general endotracheal anesthesia,  she was placed on 5 pounds of halter traction.  Neck was prepped with  alcohol and DuraPrep and draped in a sterile fashion.  Transverse  incision was created in the left side of the neck and this was carried  down through the platysma.  The plane between the sternocleidomastoid  and the strap muscles was dissected bluntly until the prevertebral space  was reached.  Here, large ventral osteophytosis was noted particularly  at C3-C4.  Once this was identified, the plate was identified just below  this and there was bony overgrowth over the inferior margin of the plate  and the screws.  There was some overgrowth from an  osteophyte hanging  over C3-C4.  This was taken down carefully with a Leksell rongeur and  then the screws were loosened and individually they were removed  removing the central locking screw first, then the outer 14 mm screws  that secured the plate.  Once the plate was removed some hemostasis from  the bleeding bone holes was controlled with some small pledgets of  Gelfoam that was stuffed into the holes.  The soft tissues were then  checked for hemostasis also and bipolar cautery was used to maintain  this.  The disk space at C3-C4 was then further explored once the  ventral osteophytes were removed from the C3-C4 level.  The disk space  was then opened with #15 blade and a combination of Kerrison rongeurs  was used to evacuate substantial quantity of severely degenerating  desiccated disk material from within the disk space itself.  Self-  retaining disk spreader was placed in the wound on the left side and  this allowed for evacuation of the deep very desiccated disk material  that was protruding into their subligamentous space.  This was resected  both under the superior margin of the body of  C4.  The ligament itself  was then opened and a large osteophyte from the inferior margin of the  body of C3 was resected.  The dissection was carried out to the lateral  recesses to decompress those areas.  With the traction of 5 pounds being  applied, there was some reduction of the spondylolisthesis and  ultimately the central portion of the dura and the lateral recesses were  well decompressed.  Hemostasis was achieved in the epidural bleeding.  An 8-mm trans graft was fashioned to fit in the interspace between C3-  C4.  A 16-mm standard size Trestle plate was then fitted to the ventral  aspect of the vertebral bodies with variable angled 14-mm screws in C3  and C4.  The area was then checked for hemostasis in the soft tissues.  A final radiograph identified good position of the hardware and  then the  platysma was closed with 3-0 Vicryl in interrupted fashion, 3-0 Vicryl  was used in the subcuticular tissues and Dermabond was placed on the  skin.  Blood loss was estimated less than 50 mL total.      Stefani Dama, M.D.  Electronically Signed     HJE/MEDQ  D:  08/22/2008  T:  08/23/2008  Job:  [id number]

## 2011-03-02 NOTE — Cardiovascular Report (Signed)
NAMEDESERAI, CANSLER NO.:  [id number]   MEDICAL RECORD NO.:  [id number]          PATIENT TYPE:  INP   LOCATION:  2807                         FACILITY:  [facility]   PHYSICIAN:  Francisca December, M.D.  DATE OF BIRTH:  04/06/1946   DATE OF PROCEDURE:  DATE OF DISCHARGE:                            CARDIAC CATHETERIZATION   PROCEDURES PERFORMED:  1. Left heart catheterization.  2. Left ventriculogram.  3. Abdominal aortogram.  4. Coronary angiography.  5. Selective right renal angiography.   INDICATIONS:  [name] is a 65 year old woman with multiple risk  factors for coronary artery disease, who presented yesterday with  prolonged anginal chest pain.  She has ruled out for myocardial  infarction by repeat ECG and CK-MB and troponin enzymes.  She is  expecting major orthopedic surgery within the next 2 weeks.  Because of  the severity of her symptoms and the rather typical description, as well  as her need for complete risk assessment prior to her surgery, she is  undergoing cardiac catheterization to definitively establish the  diagnosis of coronary disease and provide for further therapeutic  options.   She does have hypertension and mild renal insufficiency.  Therefore,  abdominal aortography and if necessary, a renal selective angiography  will be obtained.   PROCEDURE NOTE:  The patient was brought to cardiac catheterization  laboratory in the fasting state.  The right groin was prepped and draped  in the usual sterile fashion.  Local anesthesia was obtained with  infiltration of 1% lidocaine.  A 6-French catheter sheath was inserted  percutaneously into the right femoral artery, utilizing the anterior  approach over guiding J-wire.  Left heart catheterization and coronary  angiography then proceeded in a standard fashion, using 6-French #4 left  and right Judkins catheters and a 110-cm pigtail catheter.  The pigtail  catheter was used to perform  cineangiography of the left ventricle in  the RAO 30-degree projection.  Thirty-six milliliters were injected at  the at 12 mL per second, utilizing a power injector.  The pigtail  catheter was withdrawn into the abdominal aorta, and a cine angiogram of  the abdominal aorta and AP projection obtained again with a power  injector.  Thirty milliliters were injected at 15 mL per second.  Following selective coronary angiography, the right coronary catheter  was withdrawn into the abdominal aorta and a right renal artery  cannulated.  Cineangiography was performed in a slight LAO cranial  angulation.  At the completion of procedure, the catheter was removed.  A right femoral arteriogram in the 45-degree RAO angulation via the  catheter sheath by hand injection documented adequate anatomy for  placement of the percutaneous closure device Angio-Seal.  This was  subsequently deployed with good hemostasis and an intact distal pulse.   HEMODYNAMIC RESULTS:  Systemic arterial pressure was 152/86 with a mean  of 115 mmHg.  There was no systolic gradient across the aortic valve.  The ventricular end-diastolic pressure was 24 mmHg pre-ventriculogram.   ANGIOGRAPHY:  The left ventriculogram demonstrated normal chamber size  and normal global systolic function, without  regional wall motion  abnormality.  A visual estimated ejection fraction is 65% to 70%.  There  is no coronary calcification seen.  The aortic valve is tri-leaflet and  opens normally during systole.  There is no mitral regurgitation.   There is a right-dominant coronary system present.  The main left  coronary artery is normal.   The left anterior descending artery and its branches are normal.  This  is a type 1 LAD, in that it reaches but does not traverse the apex.  There is a single moderate-to-large sized diagonal branch that arises  and is without obstruction.  Again, no obstructions are seen whatsoever  in the LAD diagonal  system.   The left circumflex coronary os branches are normal.  Two very small  marginal branches arise proximally, and then a large third marginal  which is the dominant vessel on the posterolateral wall of the heart.  There are no obstructions in the left circumflex or its major branch.  Luminal irregularities are not seen.   The right coronary is large and dominant.  There is a focal 20%  narrowing in the mid-portion, otherwise no obstructions are seen.  This  includes the absence of any luminal irregularities.  Distally, the  vessel bifurcates into a large posterior descending artery and a large  posterolateral segment with a single large left ventricular branch.  Visualization of these branches are somewhat obscured by hardware in the  lumbar spine, that is pins and rods.  However, adequate visualization is  seen to rule out significant obstruction.   Collateral vessels are not seen.   The abdominal aortogram reveals a widely patent abdominal and distal  aorta, again no significant atherosclerotic disease is seen.  The left  renal artery is easily visualized and is widely patent.  There is  perhaps 20% tubular narrowing in the proximal segment.  The right renal  artery is well visualized by selective cannulation, and there is again a  10% to 20% eccentric narrowing seen proximally.  No significant  obstruction present.   FINAL IMPRESSION:  1. Essentially normal coronaries.  A minimal insignificant obstruction      is present in the mid-portion of the right coronary.  2. Systemic hypertension.  3. Elevated left ventricular end-diastolic pressure.  4. Minimal atherosclerotic disease of both renal arteries, but each is      widely patent.  5. Non-cardiac chest pain.   PLAN:  1. The patient will be transferred to a non-telemetry bed for      anticipated discharge later today.  2. A suggestion for a proton pump inhibitor for treatment of presumed      esophagitis is  recommended.  3. Aggressive treatment of her systemic hypertension would be      appropriate.  Certainly her left ventricular end-diastolic pressure      was quite elevated, and she might benefit from more aggressive      diuresis.      Francisca December, M.D.  Electronically Signed     JHE/MEDQ  D:  01/17/2008  T:  01/17/2008  Job:  [id number]   cc:   Gwen Pounds, MD

## 2011-03-02 NOTE — Procedures (Signed)
DUPLEX DEEP VENOUS EXAM - LOWER EXTREMITY   INDICATION:  Left leg edema.   HISTORY:  Edema:  Yes  Trauma/Surgery:  Left knee replacement.  Pain:  Yes  PE:  Yes  Previous DVT:  Yes  Anticoagulants:  Yes   DUPLEX EXAM:                CFV   SFV   PopV  PTV    GSV                R  L  R  L  R  L  R   L  R  L  Thrombosis    o  o     o     o      o     o  Spontaneous   +  +     +     +      +     +  Phasic        +  +     +     +      +     +  Augmentation  +  +     +     +      +     +  Compressible  +  +     +     +      +     +  Competent     +  +     +     +      +     +   Legend:  + - yes  o - no  p - partial  D - decreased   IMPRESSION:  There does not appear to be any evidence of deep vein  thrombus noted in the left leg.  Incidental finding of a Baker cyst  behind the left knee.   Results given to [facility].    _____________________________  Quita Skye. Hart Rochester, M.D.   CB/MEDQ  D:  02/18/2010  T:  02/18/2010  Job:  [id number]

## 2011-03-02 NOTE — Op Note (Signed)
NAMEJAYLEAH, [name]               ACCOUNT NO.:  [id number]   MEDICAL RECORD NO.:  [id number]          PATIENT TYPE:  AMB   LOCATION:  DAY                          FACILITY[facility]  [facility]   PHYSICIAN:  Madlyn Frankel. Charlann Boxer, M.D.  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   PROCEDURE:  Excision of right knee neuroma.   SURGEON:  Madlyn Frankel. Charlann Boxer, M.D.   ASSISTANT:  None.   ANESTHESIA:  General LMA.   TOURNIQUET TIME:  Fifteen minutes.   SPECIMENS:  I did send out the excised tissue to confirm diagnosis.   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  She  was revised.  At the time of her index procedure, she had a significant  wound complication with delayed wound-healing postoperatively after the  original surgery; she healed up nicely.  She had some anterior knee  discomfort with exquisite tenderness over the anterior aspect of the  knee and on exam, this was quite sensitive and a palpable defect was  noted.   I discussed with her treatment options.  We tried in the office to treat  her conservatively with 2 Marcaine injections as well as Lidoderm  patches, but she had persistent discomfort.  After failing this, she  wished to discuss surgical intervention and I discussed the potential  excision of this area of exquisite tenderness, trying to remove that  nerve pain.  Consent was obtained after reviewing the risks and benefits  of this type of procedure.   PROCEDURE IN DETAIL:  The patient was brought to operative theater.  Once adequate anesthesia and preoperative  antibiotics administered, the  patient was positioned supine.  Preoperatively, the area was identified  and marked on the knee prior to prepping and draping to identify the  location and confirm. It was also identified and the knee was prepped  out.  There was an obvious palpable neuromatous-type structure palpable  through the subcu tissues.  The right lower extremity was then placed  into a proximal thigh tourniquet.  The right lower extremity was pre-  scrubbed and prepped and draped in sterile fashion.  A portion of her  previous midline incision was utilized.  Skin rakes were utilized to  help dissect the subcu tissue.  I digitally palpated this area,  localized it and using dissecting scissors, was able to dissect out this  area of either nerve versus scar-type tissue.  I was able to dissect out  a good 5- to 6-cm segment of it and then amputated it, allowing the  remainder of this tissue to retract medially.   I also opened up some the scarring in the inferior portion of this  wound, where her previous incision  was.   Following this, the knee was irrigated with normal saline solution.  I  then reapproximated the subcu layer with 2-0 Vicryl, reapproximated the  skin with a 4-0 Monocryl, dressed the knee with Steri-Strips and  injected the knee with 0.5% plain Marcaine.  I then placed Steri-Strips  and Xeroform dressing.  The knee was then placed in a bulky dressing.  She was brought to the recovery room, extubated, in stable condition.  She tolerated the procedure well.      Madlyn Frankel Charlann Boxer, M.D.  Electronically Signed     MDO/MEDQ  D:  05/15/2007  T:  05/16/2007  Job:  [id number]

## 2011-03-02 NOTE — Op Note (Signed)
NAMESTEPHAN, [name]               ACCOUNT NO.:  [id number]   MEDICAL RECORD NO.:  [id number]          PATIENT TYPE:  INP   LOCATION:  0008                         FACILITY:  [facility]   PHYSICIAN:  Madlyn Frankel. Charlann Boxer, M.D.  DATE OF BIRTH:  28-May-1946   DATE OF PROCEDURE:  01/30/2008  DATE OF DISCHARGE:                               OPERATIVE REPORT   PREOPERATIVE DIAGNOSES:  1. Painful right knee neuroma branch off the saphenous infrapatellar      branch.  2. Left hip osteoarthritis.   POSTOPERATIVE DIAGNOSES:  1. Painful right knee neuroma branch off the saphenous infrapatellar      branch.  2. Left hip osteoarthritis.   PROCEDURES:  1. Excision of a right knee neuroma.  2. Left total hip replacement.  These are two separate procedures on      two separate extremities.   SURGEON:  Madlyn Frankel. Charlann Boxer, M.D.   ASSISTANT:  Yetta Glassman. Mann, PA   COMPONENTS USED:  Left hip were a DePuy hip system, a size 52 ASR cup, a  size 4 standard Trilock stem with a 46 ASR ball with a +8 adapter.   ANESTHESIA:  General.   BLOOD LOSS:  400 mL for the left total hip.   TOURNIQUET TIME:  On the right knee was 16 minutes at 300 mmHg.   DRAINS:  Times one in the left hip.   INDICATIONS FOR PROCEDURE:  Ms. Garron is a 65 year old patient of mine  who I have followed for some time after a diagnosis of right total knee  instability who underwent revision surgery to enlarge the polyethylene  as possible.  She has continued to have problems with this knee.  A lot  of her pain has been felt to be due to hypersensitivity over the  anterior aspect of the knee with any pressure.  I had previously  attempted an excision of her neuroma, but this was unsuccessful and she  wished to trial it again.  We discussed the risks and benefits which she  was aware of.   The second part of the procedures was painful left hip osteoarthritis  diagnose clinically and radiographically as well as failure of  conservative measures of injections.  Given the persistent discomfort  and despite the fact that she was definitely not interested in pursuing  surgery, she at this point understood that her quality of life was very  bad based on both these issues.  She wished to have them done at the  same time.  The risks and benefits of hip surgery were discussed  including infection, DVT, component failure, dislocation, need for  revision surgery.  Risks and benefits were discussed.  Consent was  obtained.   PROCEDURE IN DETAIL:  The patient was brought to the operative theater.  Once adequate anesthesia was established and preoperative antibiotics  administered the patient was first positioned supine. A bump was placed  under the right hip.  Attention was first directed to the right lower  extremity where a proximal thigh tourniquet was placed.  The right lower  extremity was prepped and draped  in sterile fashion and then extremity  draped for a total knee procedure.  As I had previously reviewed with  Ms. Troxler I exsanguinated the leg and elevated the tourniquet.  I then  made a para midline incision which was approximately 3 cm more medial  than the distal aspect of my previous incisions.   Sharp dissection was carried down through the significant amount of  adipose tissue.   There was definitely a lot of fibers that were transversing in this  area.  A few that had a distinct nerve type appearance I ended up  dissecting a significant amount of tissue, fat tissue and any other  tissue that appeared nerve in origin over this anterior medial aspect of  the knee.  In addition to this I was able to palpate along the anterior  aspect of the tibia what I felt like I was feeling clinically.  I did  incise this and removed a portion here again. When I removed this there  was basically an area where she was painful which was marked  preoperatively with the patient.  There was no other tissues   transversing this area from the skin down to the bone.   Given this I irrigated this wound out and reapproximated the subcu layer  with 2-0 Vicryl and reapproximated the skin using staples.  I then let  the tourniquet down at 16 minutes and dressed it sterilely with Xeroform  dressing, sponges and tape.   Following this procedure the bump was removed from the right hip and the  patient was converted over to the right lateral decubitus position with  the left side up.  Once she was securely fixed on the bed with bony  prominences padded with peg board positioner I pre scrubbed the left  lateral hip and then prepped it and draped it in a sterile fashion.  A  lateral incision was made taking it down deep through a lot of  subcutaneous fat tissue down to the iliotibial band and gluteus fascia.  I incised this with the Bovie down for a posterior approach to the hip.  through fat pad along the posterior lateral aspect of the femur I  identified the short external rotators and had taken them down  separately from the posterior capsule.  An L capsulotomy was made  identifying the joint which did have a fusion in it.  The hip was  dislocated and then a neck osteotomy made based off anatomic landmarks  and preoperative templating. The femoral head was noted be severely  arthritic.   Attention was first directed to the femur.  Using a starting drill, I  opened up the femoral canal and then used the hand reamer once to open  up the canal.  I then irrigated to try to prevent fat emboli.  I then  began broaching with #1 broach up to a size 4 broach setting my  anteversion at 20-25 degrees.  Once I broached down in this area I did  use a calcar planer just to finish off the medial calcar position.  I  then packed off the femur with a sponge and attended the acetabulum.  Acetabular exposure was obtained with retractors in routine fashion.  Laparectomy carried out as well as debridement and exposure.   This did  remove some of the superior capsule allowing for exposure utilizing the  ASR components which I chose for her age as well as the facilitation of  the instrumentation due to her  size.  I began reaming with a 43 reamer  and then used curved reamers up to a 51 reamer with good bony bed  preparation.   At this point I impacted a 52 ASR cup I had marked on the anterior wall  at the position I wanted the cup in and it sat 5 mm or so beneath the  anterior rim.  Posterior superior there was exposed acetabular cup  indicating correct anteversion.   Once the cup was in place we did a trial reduction with a 4.  Initially  I used the high offset neck but felt that her tension on the iliotibial  band was too significant.  We switched it out to a standard neck.  I  trialed the +2 and then +5. At this point I knew at least I was in the  ballpark and removed the trial components and then impacted the size 4  standard trial lock stem down to level where the neck cut was.   I then trialed a 5 and then trialed again with the 8.  I was not happy  with the 8.  I did not feel that the leg lengths in this patient were  significantly affected.  She was a little bit longer on the right side  due to the significant polyethylene that was in the right total knee.  The 8 did give me a little bit more security with all range of motion.  There was no evidence of impingement with significant extension and  external rotation nor abduction and external rotation nor was there  evidence of any instability with forward flexion and internal rotation,  in fact it was hard to dislocate.  I felt my combined anteversion was at  approximately 40-50 degrees.   At this point the hip was reirrigated.  There was no real posterior  tissue to reapproximate due to debridement and exposure and for that  reason I removed the remaining capsule.  I reapproximated the iliotibial  band and gluteal fascia over a medium Hemovac  drain.  The remainder of  this hip wound was closed with 2-0 Vicryl and a running 4-0 Monocryl.  The hip was cleaned, dried and dressed sterilely with Steri-Strips and a  Mepilex dressing.  She was then taken to the recovery room extubated in  stable condition.  Tolerated the procedure well.      Madlyn Frankel Charlann Boxer, M.D.  Electronically Signed     MDO/MEDQ  D:  01/30/2008  T:  01/30/2008  Job:  [id number]

## 2011-03-02 NOTE — H&P (Signed)
NAMEGRACELYNN, [name]               ACCOUNT NO.:  [id number]   MEDICAL RECORD NO.:  [id number]          PATIENT TYPE:  INP   LOCATION:  2916                         FACILITY:  [facility]   PHYSICIAN:  Larina Earthly, M.D.        DATE OF BIRTH:  August 04, 1946   DATE OF ADMISSION:  01/16/2008  DATE OF DISCHARGE:                              HISTORY & PHYSICAL   CHIEF COMPLAINT:  Chest pain, left upper extremity pain.   HISTORY OF PRESENT ILLNESS:  This a 65 year old Caucasian female who has  a past medical history that is fairly complex but significant for  hypertension, hypertriglyceridemia, hypothyroidism, morbid obesity,  multiple back surgeries and knee procedures with a back surgery in 2008  complicated by the formation of DVTs, pulmonary embolism and placement  of IVC filter, now on anticoagulation.  The patient developed 10/10  chest pain with questionable shortness of breath and radiation of  numbness to her left upper extremity and jaw that started approximately  3:00 p.m. today.  EMS was called. She took an aspirin, and her pain  resolved without further intervention within the next 1-2 hours.  She  was transported to the emergency room for further evaluation and  management. In the emergency room upon my evaluation, she states that  she is still having 2 to 3/10 chest discomfort but is in no apparent  distress and talking in full sentences and denies any shortness of  breath. In retrospect, the patient states that she saw Dr. Timothy Lasso, her  primary care doctor, an internist on January 12, 2008, for medical  clearance for upcoming surgery involving her left hip and right knee by  Dr. Durene Romans. Multiple labs were obtained at the time which were all  reasonable and dictated below, but, given her elevated blood pressure at  the time on beta blocker alone, Benicar 20 mg day was added.  The  patient states that she has been checking her blood pressure over the  past weekend, and her systolic  blood pressures have been ranging in the  130s and quite different from her elevated blood pressure in the 150s  and 160s from prior to the initiation of Benicar.   REVIEW OF SYSTEMS:  Review of Systems is further negative for fevers,  chills, nausea, vomiting, trauma, palpitations, presyncopal symptoms, or  new musculoskeletal pain.   PROBLEM LIST:  1. Hypertension.  2. Multiple back surgeries by Dr. Danielle Dess.  3. History of pulmonary embolism secondary to deep vein thrombosis and      requiring IVC filter secondary to transient need to take the      patient off of her Coumadin during hospitalization in 2008.  4. Hypertriglyceridemia.  5. Hypothyroidism.  6. Morbid obesity.  7. Gastroesophageal reflux disease.  8. Familiar hypercalcemia.  9. History of total abdominal hysterectomy.  10.Status post cholecystectomy.  11.Status post tonsil tonsillectomy and adenoidectomy  12.History of right neck surgery.  13.History of total knee replacement.  14.Chronic renal insufficiency.  15.History of blood loss anemia.  16.Insomnia.  17.Peripheral neuropathy.  18.Anxiety disorder.   SOCIAL HISTORY:  The  patient is married for multiple decades, has three  children and six grandchildren, is disabled secondary to her back and  denies any tobacco or alcohol abuse.   FAMILY HISTORY:  Is significant for father having passed away at the age  of 19 with course complicated by nephrolithiasis, coronary artery  disease, bypass surgery, peripheral vascular disease.  Mother passed  away at the age of 34 of coronary artery disease.  A sister passed away  at the age of 38 of a stroke.   CURRENT MEDICATIONS:  1. Synthroid 75 mcg each day.  2. Flexeril 10 mg p.o. q. 8 h p.r.n.  3. Alprazolam 0.5 mg p.o. b.i.d.  4. Calcium and vitamin D b.i.d.  5 . Multivitamin each day.  1. Lasix 60 mg q.a.m.  2. Ambien 10 mg p.o. nightly p.r.n.  3. Atenolol 25-50 mg daily.  4. Indomethacin 50 mg p.o. b.i.d.  5.  Neurontin 600 mg p.o. 3 times a day.  6. Tricor 145 mg p.o. daily.  7. Vicodin p.r.n.  8. Prilosec 20 mg each day.  9. Iron each day.  10.Benicar 20 mg each day.  11.Coumadin per medication resource management at our office.   LABORATORY EVALUATION:  White blood cell count 7.0, hemoglobin 11.4,  hematocrit 33.9%, platelet count 338. Myoglobin 43, CK-MB less than 1.0,  troponin I less than 0.05.  Sodium 134, potassium 4.6, chloride 101, BUN  26, creatinine 1.4, glucose 123.   Chest x-ray reveals some mild right lower lobe atelectasis, otherwise  unremarkable.   EKG reveals normal sinus rhythm with no acute changes.   Please note that labs obtained by my partner on January 12, 2008, also  revealed a normal EKG with normal sinus rhythm.   BUN 28, creatinine 1.5, potassium 5.5.  Liver function tests normal,  calcium 10.9, hemoglobin 11.9. Total cholesterol 248, triglycerides 272,  HDL 41, LDL 153. TSH 1.17.   PHYSICAL EXAMINATION:  GENERAL:  We have a pleasant Caucasian female  sitting in bed in no apparent distress, answering all questions  appropriately, alert and oriented x3.  VITAL SIGNS:  Blood pressure 160/99, temperature 97.5 degrees  Fahrenheit, pulse 68 and regular, respirations 18 and not labored,  oxygen saturation 96% on room air.  HEENT:  Sclerae anicteric.  Extraocular movements are intact.  Pupils  were equal and reactive.  Face is symmetrical.  There are no  oropharyngeal lesions.  Tongue is midline.  NECK:  Supple.  There is no cervical lymphadenopathy.  There are no  carotid bruits.  There is no JVD.  LUNGS:  Clear to auscultation bilaterally.  CARDIOVASCULAR:  Exam reveals a regular rate and rhythm without murmurs,  rubs, gallops.  ABDOMEN:  Reveals a soft, nontender, nondistended abdomen.  Bowel sounds  present.  EXTREMITIES:  Exam reveals pulses intact and no edema, but venous  insufficiency changes present with obese legs.  NEUROLOGIC:  Exam is grossly  nonfocal   ASSESSMENT/PLAN:  1. Chest pain, rule out myocardial infarction. Will provide oxygen and      continue anticoagulation with Coumadin unless cardiology      consultation requests that we change the patient over to heparin      pending further evaluation and management of her chest discomfort.      EKG currently reveals no acute changes.  Initial cardiac enzymes      are unremarkable.  Currently O2 saturation is normal on room air.      Again, given the lack of pleuritic chest  discomfort, the lack of      EKG changes, and the lack of abnormal chest x-ray, doubt recurrent      pulmonary embolism in the setting of an IVC filter and slightly      subtherapeutic PT/INR at 1.8.  Again,  cardiac consultation is      pending, especially given the upcoming surgery on the patient's      left hip and right knee with a Dr. Durene Romans.  2. Hypertension.  Will continue beta blockade and ARB and hold for      bradycardia or hypotension.  3. Hypothyroidism, currently clinically euthyroid.  Will continue      current supplementation given normal TSH from March of this year.  4. History of pulmonary embolism and deep vein thrombosis.  Again,      will continue anticoagulation.  Currently the patient is      hemodynamically stable, and the patient states that she did have an      EGD by Dr. Vilinda Boehringer within the last year that revealed no issues      or evidence of ulcer formation despite using nonsteroidal anti-      inflammatory agents in the presence of Coumadin. For the time      being, we will hold all nonsteroidal anti-inflammatory agents,      especially in light of her mild renal insufficiency and the      possible intervention with cardiac catheterization pending her      evaluation.  5. Arthralgias and multiple surgeries on the back and knee.  We will      continue pain management and pursue cardiac clearance for any      upcoming planned surgery.      Larina Earthly, M.D.   Electronically Signed     RA/MEDQ  D:  01/16/2008  T:  01/17/2008  Job:  [id number]   cc:   Gwen Pounds, MD  Madlyn Frankel. Charlann Boxer, M.D.

## 2011-03-02 NOTE — H&P (Signed)
[name], [name]               ACCOUNT NO.:  [id number]   MEDICAL RECORD NO.:  [id number]          PATIENT TYPE:  INP   LOCATION:  NA                           FACILITY:  [facility]   PHYSICIAN:  Madlyn Frankel. Charlann Boxer, M.D.  DATE OF BIRTH:  1946-02-13   DATE OF ADMISSION:  01/30/2008  DATE OF DISCHARGE:                              HISTORY & PHYSICAL   PROCEDURE:  1. Right knee neuroma excision.  2. Left total hip arthroplasty.   CHIEF COMPLAINT:  1. Right knee pain.  2. Left hip pain.   HISTORY OF PRESENT ILLNESS:  [name] is a 65 year old female with a  history of right total knee replacement with subsequent persistent right  anterior knee pain unrelieved by all conservative treatments.  In  addition, she has had progressive left hip pain secondary to  osteoarthritis.  Both have been refractory to conservative treatment.  She had been presurgically assessed for surgery.   PAST MEDICAL HISTORY:  Significant for:  1. Osteoarthritis.  2. Right knee neuroma.  3. Hypertension.  4. Hyperthyroidism.  5  Anemia.  1. Degenerative disk disease.   PAST SURGICAL HISTORY:  1. Right total knee replacement.  2. Eleven back surgeries.  3. Cholecystectomy.  4. Tonsillectomy.  5. Tubal ligation.  6. Partial hysterectomy.  7. Carpal tunnel release bot hands.  8. Left total knee replacement.   SOCIAL HISTORY:  Patient is married to husband, Mardelle Matte.  She is a  nonsmoker.   FAMILY HISTORY:  Heart disease, hypertension, diabetes, cancer, stroke,  arthritis.   DRUG ALLERGIES:  No known drug allergies.   MEDICATIONS:  1. Hydrocodone is 7.5/750 one p.o. q. 6-8 h p.r.n.  2. Gabapentin 600 mg 1 tablet three times daily.  3. Indomethacin 50 mg p.o. b.i.d.  4. Flexeril 10 mg one p.o.3 times a day.  5. Coumadin one 5 mg tablets for 4 days and then one 2.5 mg tablet for      3 days.  6. Alprazolam 0.5 mg p.o. b.i.d.  7. Atenolol 5 mg one p.o. daily.  8. Benicar 20 mg one p.o. daily.  9.  Omeprazole 20 mg one p.o. daily to b.i.d.  10.Synthroid 75 mcg one p.o. daily.  11.Tricor 145 mg p.o. daily.  12 . Furosemide 20 mg three q.a.m. and 20 mg one q.p.m.  1. Iron 325 mg one p.o. daily.  2. Stool softener 100 mg x2.  3. Nature joint glucosamine chondroitin MSM x2 daily.  4. MiraLax one p.o. daily.  5. Calcium 600 plus D p.o. b.i.d.  6. Lidoderm patches one to three as needed.  7. Spectrovite Vitamin p.o. daily.   REVIEW OF SYSTEMS:  CARDIOVASCULAR:  She had recent chest pain and was  admitted to the hospital for evaluation.  Cardiac markers were negative.  As well, catheterization was okay, and she still continued to be cleared  for surgery.  Otherwise, see HPI.  NEUROLOGIC:  She has some impaired  vision with a bind spot on her left side.  HEMATOLOGY:  She has had  multiple transfusions.  She had a blood clot and has  a filter in place  for blood clot.  See HPI.   PHYSICAL EXAMINATION:  VITAL SIGNS:  Pulse 72, respirations 18, blood  pressure 144/92.  GENERAL:  Awake, alert and oriented, well-developed, well-nourished, no  acute distress.  NECK:  Supple.  No carotid bruits.  CHEST:  Lungs clear to auscultation bilaterally.  BREASTS:  Deferred.  HEART:  Regular rate and rhythm.  S1-S2 distinct.  ABDOMEN:  Soft, nontender, nondistended.  Bowel sounds present.  GENITOURINARY:  Deferred.  EXTREMITIES:  Left hip range of motion has increased pain.  Right knee  is hypersensitive.  SKIN:  Non-cellulitic.  NEUROLOGIC:  Intact distal sensibilities.   LABORATORY DATA:  EKG, chest x-ray all pending presurgical testing.   IMPRESSION:  1. Right inferior patella neuroma.  2. Left hip osteoarthritis.   PLAN OF ACTION:  1. Right knee and rhythm excision.  2. Left total hip replacement.   Surgeon:  Dr. Durene Romans.  Risks and complications were discussed.  Questions were encouraged, answered, and reviewed.     ______________________________  Yetta Glassman [address],  [place]      Madlyn Frankel. Charlann Boxer, M.D.  Electronically Signed    BLM/MEDQ  D:  01/24/2008  T:  01/24/2008  Job:  [id number]   cc:   Gwen Pounds, MD  Fax: [phone]   Stefani Dama, M.D.  Fax: [phone]   Francisca December, M.D.  Fax: [phone]

## 2011-03-02 NOTE — Procedures (Signed)
DUPLEX DEEP VENOUS EXAM - LOWER EXTREMITY   INDICATION:  Four days of right leg pain and redness.   HISTORY:  Edema:  Mild right leg edema.  Trauma/Surgery:  The patient had knee surgery many years ago.  Pain:  Four days of right leg pain.  PE:  The patient had a pulmonary embolism in  February 2008.  Previous DVT:  The patient had left leg DVT in February 2008.  Anticoagulants:  The patient was on Coumadin until 4 days ago.  Other:  The patient had a [place] filter placed during hospital stay  in February 2008.   DUPLEX EXAM:                CFV   SFV   PopV  PTV    GSV                R  L  R  L  R  L  R   L  R  L  Thrombosis    0  0  0     0     0      0  Spontaneous   +  +  +     +     +      +  Phasic        +  +  +     +     +      +  Augmentation  +  +  +     +     +      +  Compressible  +  +  +     +     +      +  Competent     +  +  +     +     +      +   Legend:  + - yes  o - no  p - partial  D - decreased   IMPRESSION:  No evidence of right leg deep venous thrombosis,  superficial venous thrombosis or Baker's cyst.   _____________________________  Janetta Hora Fields, MD   MC/MEDQ  D:  06/05/2007  T:  06/06/2007  Job:  [id number]

## 2011-03-05 NOTE — Discharge Summary (Signed)
   [name], [name]                         ACCOUNT NO.:  [id number]   MEDICAL RECORD NO.:  [id number]                   PATIENT TYPE:  INP   LOCATION:  3015                                 FACILITY:  [facility]   PHYSICIAN:  Stefani Dama, M.D.               DATE OF BIRTH:  1946-09-25   DATE OF ADMISSION:  10/29/2002  DATE OF DISCHARGE:  11/03/2002                                 DISCHARGE SUMMARY   ADMISSION DIAGNOSIS:  Lumbar spondylosis and stenosis L3-4 with lumbar  radiculopathy.   DISCHARGE DIAGNOSIS:  Lumbar spondylosis and stenosis L3-4 with lumbar  radiculopathy.   OPERATION:  L3-4 laminectomy, posterior interbody arthrodesis with  __________ bone spacer, local allograft pedicular fixation nonsegmental L3-4  with posterior lateral arthrodesis with autograft and allograft removal of  hardware  L4-5 on 10/29/02.   CONDITION ON DISCHARGE:  Improving.   HOSPITAL COURSE:  The patient  is a 65 year old individual who has had  significant back and leg pain in the regions of both thighs, but mostly in  the anterior left aspect of the thigh.  She was found to have spondylitic  stenosis at the L3-4 level above her previous arthrodesis.  After careful  consideration of her  options, she was advised regarding surgical  decompression and stabilization at  L3-4.  She was taken to the operating  room on 10/29/02 where this was performed.  Postoperatively, she seemed to  have exacerbation of her lumbar radicular symptoms.  This was treated  conservatively with the use of nonsteroidal anti-inflammatories.  Toradol  seemed to help somewhat.  The patient was discharged home on Vioxx 50 mg a  day which she had been taking preoperatively in addition to hydrocodone  10/650 as needed for pain and Valium as a muscle relaxer.  She will be seen  in the office in about three weeks time for further followup.  Her incision  has remained clean and dry.  She has been  afebrile and her vital signs  have  been stable otherwise.                                               Stefani Dama, M.D.    [name]  D:  11/02/2002  T:  11/04/2002  Job:  [id number]

## 2011-03-05 NOTE — Discharge Summary (Signed)
[name], [name]               ACCOUNT NO.:  [id number]   MEDICAL RECORD NO.:  [id number]          PATIENT TYPE:  INP   LOCATION:  5030                         FACILITY:  [facility]   PHYSICIAN:  Madlyn Frankel. Charlann Boxer, M.D.  DATE OF BIRTH:  1946/06/13   DATE OF ADMISSION:  08/16/2006  DATE OF DISCHARGE:  08/20/2006                                 DISCHARGE SUMMARY   ADMITTING DIAGNOSES:  1. Status post left total knee replacement.  2. Osteoarthritis.  3. Hypertension.  4. Hyperthyroidism.  5. Anemia.  6. Anxiety.  7. Lumbosacral spondylosis.   DISCHARGE DIAGNOSES:  1. Status post total knee replacement.  2. Osteoarthritis.  3. Hypertension.  4. Hypothyroidism.  5. Anemia.  6. Anxiety.  7. Lumbosacral spondylosis.   CONSULTS:  None.   PROCEDURES:  1. Excision of old scar including old anterior knee wound.  2. Revision right knee with polyethylene exchange, exchanging an [employer]      Scorpio size #7, tibial tray insert, 18 mm up to 24-mm insert.  3. Primary wound closure in multiple layers over previous nonhealing      wound.   BRIEF HISTORY:  [name] is a very pleasant 65 year old female well known  to our practice with a history of right total knee placement with a  nonhealing wound.  The knee was replaced approximately 2 years ago and since  then has experienced a nonhealing wound and has continued to have knee  instability and a giving out sensation.  After speaking with Dr. Charlann Boxer about  the measures that could be taken, it was determined that a polyethylene  exchange with a possible gastric skin flap might be needed.   LABS:  Preoperative complete blood count:  Hemoglobin 12.2, hematocrit 37.6  tracked during her course of stay, on October 31 it was 11.6 and 35.6, on  November 1 hemoglobin 10.0, hematocrit 30.5.  Preadmission coags showed her  PT 13.1, INR 1.0 on October 31, tracked serially upon discharge, PT 13.8,  INR 1.0.  Routine chemistries preoperatively  showed sodium 138, potassium  5.1, glucose 108.  Prior to discharge, sodium 140, potassium 4.1, glucose  101.  Blood bank testing was A positive.  Wound cultures were taken which  showed no organisms grown after 2 days, no anaerobes isolated.   Preoperative EKG shows normal sinus rhythm.   X-rays of the lumbar spine due to low back pain and previous fusion showed  fusion of the lowest 4 levels of the sacrum and pronounced degenerative  disease, particularly at the level immediately above the fusion.  No other x-  rays performed.   HOSPITAL COURSE:  Patient tolerated the procedure well, was admitted to  orthopedic floor.  Postop day #1, patient was still doing fine, the spinal  block continued to work, had negative straight leg raise, pain was well  controlled with Percocet.  PT/OT was to focus on quad strength.  Hemovac was  pulled on day 1.  Postop day #2, patient continued to do well, pain was well  controlled, she was neuromuscularly and vascularly intact, her dressing was  changed, the  wound was clean, dry and intact, no xeroform was replaced,  continued the PT/OT, discharge planning for Friday.  On postop day 3, she  was doing well, but her Foley was still in.  She was afebrile, her knee  wound was clean, dry and intact, continued to be neuromuscularly and  vascularly intact, I plan to discontinue her Foley, to discharge her home  after requested MRI by Dr. Danielle Dess.  Dr. Danielle Dess had come by to see her and  wanted to go ahead and have an MRI done of her lumbar fusion and of her L  spine.  We ordered this; however, the patient states she was unable to do it  due to her claustrophobia and she could only have an MRI unless she was  sedated.  She was sent to have an MRI; however, we were unable to do the  sedation because of staples in her knee and need to schedule for an  outpatient lumbar spine MRI.  On postop day #4, she was seen by the ortho  staff and she was ready to be discharged  home.  [facility] was  selected for her physical therapy at home.  Her physical therapy during her  course of stay showed that she progressed well, did improve during her  course of stay.  Occupational therapy helped her with activities of daily  living such as bed transfers, ambulation to the bathroom.  Physical therapy  showed that by postop day #3 she was able to walk 200 feet with the use of a  rolling walker, she required minimal assistance getting out of bed, a  moderate amount of assistance getting from bed to transfer and her pain was  only 3/10 at that point in time.   DISCHARGE DISPOSITION:  She was discharged home postop day #4.   DISCHARGE CONDITION:  Stable and improved.   DISCHARGE DIET:  Regular as tolerated by patient.   DISCHARGE MEDICATIONS:  Include:  1. Tricor 145 mg p.o. daily.  2. Atenolol 25 mg p.o. daily.  3. Synthroid 75 mcg p.o. daily.  4. Lasix 60 mg p.o. daily.  5. Neurontin 600 mg p.o. t.i.d.  6. Indomethacin 50 mg p.o. b.i.d.  7. Cyclobenzaprine 10 mg p.o. daily.  8. Alprazolam 0.5 mg p.o. b.i.d.  9. Ambien 10 mg p.o. q. h.s.  10.Lidoderm 5% patch q.12 hours on, 12 hours off.  11.Lovenox 30 mg 1 subcutaneous q.12 x11 days.  12.Norco 5/325 one to 2 p.o. q.4-6 p.r.n. pain.  13.Robaxin 500 mg 1-2 p.o. q.4-6 p.r.n. muscle spasm pain.  14.Colace 100 mg 1 p.o. b.i.d. p.r.n. constipation.  15.Iron 325 x3 daily x4 weeks.   DISCHARGE PLAN:  Physical therapy should continue to work on upper body  strength.  She should be partial weightbearing 50% with use of a rolling  walker.  When she progresses to full weightbearing status, she can transfer  to a cane in her opposite hand.  She should keep the wound dry, change  dressings daily.  If she develops any worsening condition, she can return to  our office.  If she develops any acute shortness of breath or severe calf  pain, she should contact the emergency services immediately.  She is to follow  up with Dr. Charlann Boxer in 8-10 days at [phone].  We look forward  to seeing this very pleasant 65 year old female.     ______________________________  Yetta Glassman. [address], [place]      Madlyn Frankel. Charlann Boxer, M.D.  Electronically Signed  BLM/MEDQ  D:  09/06/2006  T:  09/07/2006  Job:  [id number]

## 2011-03-05 NOTE — Procedures (Signed)
[place]  Patient:    CHARNE, MCBRIEN Visit Number: [id number] MRN: [id number]          Service Type: END Location: ENDO Attending Physician:  Orland Mustard Dictated by:   Llana Aliment. Randa Evens, M.D. Proc. Date: 12/26/01 Admit Date:  12/26/2001 Discharge Date: 12/26/2001   CC:         Rodrigo Ran, M.D.  [place] Daphine Deutscher, M.D.   Procedure Report  PROCEDURE PERFORMED:  Esophageal manometry.  ENDOSCOPIST:  Llana Aliment. Randa Evens, M.D.  INDICATIONS FOR PROCEDURE:  Patient with a widely patent gastroesophageal junction on double dose proton pump inhibitors, still with severe reflux symptoms, is being considered for an antireflux operation.  DESCRIPTION OF PROCEDURE:  The procedure was performed in the [facility] manometry lab.  No provocative studies were performed.  Results are as follows.  1. Upper esophageal sphincter had somewhat increased pressure, positive    pharyngeal spikes.  Apparently normal relaxation. 2. Esophageal body.  Mean pressure in the distal esophagus 165 mm, mean    duration of 4.9 seconds, both within normal range.  Peristalsis is normal. 3. Lower esophageal sphincter mean pressure is 28 mm.  76%  relaxation.    Appears to relax to baseline.  IMPRESSION:  Normal manometry.  PLAN:  The patient will go head with surgical consultation. Dictated by:   Llana Aliment. Randa Evens, M.D. Attending Physician:  Orland Mustard DD:  01/21/02 TD:  01/22/02 Job: [id number]

## 2011-03-05 NOTE — Op Note (Signed)
[place]. [facility]  Patient:    [name], [name] Visit Number: [id number] MRN: [id number]          Service Type: END Location: ENDO Attending Physician:  Orland Mustard Dictated by:   Llana Aliment. Randa Evens, M.D. Proc. Date: 11/21/01 Admit Date:  11/21/2001   CC:         Rodrigo Ran, M.D.   Operative Report  PROCEDURE:  Esophagogastroduodenoscopy.  MEDICATIONS:  Fentanyl 100 mcg, Versed 2 mg IV.  INDICATIONS:  The patient with a long history of esophageal reflux that has been refractory to proton pump inhibitor.  SURGEON: James L. Randa Evens, M.D.  DESCRIPTION OF PROCEDURE:  The procedure had been explained to the patient and consent obtained.  The patient in the left lateral decubitus position, the Olympus videoscope was inserted under direct visualization.  The stomach was entered and pylorus identified and passed.  The duodenum including the bulb and second portion were seen well.  This was all normal.  The scope was drawn back in the stomach with pyloric channel normal, antrum and body were seen well and were normal.  There was a large hiatal hernia with widely patent GE junction and free reflux.  The distal and proximal esophagus were seen well upon withdraw of the scope.  The scope was withdrawn.  The patient tolerated the procedure well.  She was maintained on oxygen and pulse oximetry throughout the procedure.  ASSESSMENT:  Hiatal hernia with reflux.  PLAN:  Will continue on the same medications for now and see back in the office in approximately one month. Dictated by:   Llana Aliment. Randa Evens, M.D. Attending Physician:  Orland Mustard DD:  11/21/01 TD:  11/22/01 Job: [id number]

## 2011-03-05 NOTE — Discharge Summary (Signed)
[place]. [facility]  Patient:    [name], [name]                      MRN: [id number] Adm. Date:  [id number] Disc. Date: [id number] Attending:  Fritzi Mandes                           Discharge Summary  FINAL DIAGNOSES: 1. Chest pain, ruled out for myocardial infarction. 2. Abnormal liver function tests. 3. Neck pain. 4. Gastroesophageal reflux disease. 5. Hypertension, controlled.  HISTORY OF PRESENT ILLNESS:  For details of the History of Present Illness, please see my dictation dated May 18, 2000.  In brief, the patient is a 65 year old white female with a history of the above medical problems, who awoke at 2 oclock in the morning with excruciating neck pain radiating down both arms.  At some point, she developed some brief chest tightness.  To be safe, she was admitted for rule out myocardial infarction with the thought that the pain was likely related to known disk disease, in the setting of the patient having had approximately six neck surgeries in the past.  The patient was admitted and treated with nitroglycerin paste, O2 at 2 L by nasal cannula, morphine p.r.n.  Serial CKs were obtained which were normal. She then had a consultation by [facility], with the suggestion that she undergo an echocardiogram.  This showed the left ventricle to be normal, estimated ejection fraction 55-65%.  Adenosine-Cardiolite was obtained and this was negative for pharmacologic stress-induced ischemia.  Consultation was obtained from Dr. 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  He ordered an MRI of the spine which showed anterior plate fusion of C4 and C5, probable right-sided disk herniation at C5 and C6, disk degeneration and spondylosis at C6 and C7.  Laboratories during the hospitalization included white blood cell 7.7, hemoglobin 12.0, hematocrit 34.6, platelets 299.   Chemistries were unremarkable with the exception of her AST on admission being 87 and ALT being 97.  During the course of the next two days, her AST fell to normal range of 37, ALT was slightly elevated at 67.  CK with MBs during the hospitalization was 57, 46, and 41.  Troponin I on admission was 0.11.  Hepatitis serologies were negative.  At the time of discharge, her pain was better.  She had been adequately cleared in terms of her cardiac status.  She was to follow up with Dr. Danielle Dess regarding her neck.  CONDITION ON DISCHARGE:  Improved.  DISCHARGE INSTRUCTIONS:  The patient was to discharge on her usual home medications and to follow up with myself and with Dr. Danielle Dess.  HOME MEDICATIONS:  Indomethacin 25 mg one p.o. b.i.d., Synthroid 0.075 mg one p.o. q.d., Vicodin p.r.n. pain, Baclofen one p.o. b.i.d., Xanax 0.5 mg one p.o. b.i.d., calcium with vitamin D one p.o. b.i.d., multivitamin one p.o. q.d., aspirin 81 mg p.o. q.d., Cozaar 100 mg p.o. q.d., Prevacid 30 mg one p.o. q.d. DD:  06/06/00 TD:  06/07/00 Job: [id number]

## 2011-03-05 NOTE — Op Note (Signed)
[name], MAJ NO.:  [id number]   MEDICAL RECORD NO.:  [id number]          PATIENT TYPE:  INP   LOCATION:  2899                         FACILITY:  [facility]   PHYSICIAN:  Madlyn Frankel. Charlann Boxer, M.D.  DATE OF BIRTH:  1946/03/24   DATE OF PROCEDURE:  08/16/2006  DATE OF DISCHARGE:                                 OPERATIVE REPORT   PREOPERATIVE DIAGNOSIS:  Right total knee instability with a history of  anterior wound complication with delayed healing.   POSTOPERATIVE DIAGNOSIS:  Right total knee instability with a history of  anterior wound complication with delayed healing.   PROCEDURE:  1. Excision of old scar, including old anterior knee wound.  2. Revision, right knee, with polyethylene exchange, exchanging an      [employer] Scorpio size #7 tibial tray insert, 18 mm, up to a 24 mm      insert.  3. Primary wound closure in multiple layers at this area of concern of      previous nonhealing wound.   SURGEON:  Madlyn Frankel. Charlann Boxer, MD.   ASSISTANTS:  Worthy Rancher, MD, and Dwyane Luo, PA-C.   ANESTHESIA:  General.   BLOOD LOSS:  Minimal.   TOURNIQUET TIME:  60 minutes at 300 mmHg   COMPLICATIONS:  None.   DRAINS:  x1.   INDICATIONS FOR PROCEDURE:  Miss Pendley is a 65 year old female who was  kindly referred from Dr. Jackelyn Hoehn for consideration of a right knee  complaint.  She had several complaints.  One was an 21-month, nonhealing  wound over the anterior aspect of the knee that did not result in any  infectious intraarticular process.   Her second major concern was the sense of instability to the knee, and when  she did certain movements, stair climbing, etc., the knee felt like it was  going to get out.  I discussed with her the surgical options at this point,  given well-fixed femoral and tibial components, would be basically to  exchange her polyethylene in an attempt to maximize her return in stability.  We also discussed her wound healing,  complications, and concerns that she  had there as well as my approach to it.   Following the lengthy discussions, the patient was consented for the OR with  standard risk reviewed of infection, DVT, component failure, need for  further revision due to instability, and other complicating features.  Consent was obtained.   PROCEDURE IN DETAIL:  The patient was brought to the operative theater.  Once adequate anesthesia and preoperative antibiotics, 2 g of Ancef were  administered, the patient was positioned supine.  A proximal thigh  tourniquet was placed on the right lower extremity, prescrubbed and prepped  and draped in sterile fashion using DuraPrep.  The patient's old scar and  old nonhealing wound site were excised.  This was excised down to the  extensor mechanism.  There were no signs for any purulence or concern for  infection.  No evidence of any abscess.  Once the extensor mechanism was  identified, a median parapatellar arthrotomy was carried out.  Again, joint  arthrotomy was created, and normal synovial fluid was encountered.  I did  culture this, however.   Following further exposure, including synovectomies in the medial and  lateral gutters and suprapatellar pouch, the polyethylene insert, size 18,  was removed.   Further debridement was carried out, and I went ahead and irrigated the knee  out with pulse lavage normal saline solution.  A trial reduction was carried  out, first with a 21 poly and then a 24-mm thick polyethylene insert with a  size 7 Scorpio tray.  I felt the 24-mm gave [place] more stability and extension  and through flexion.  For this reason, we chose this.  This is important, as  a 24 is the thickest polyethylene that is made, other than custom, and given  the stability of her tibial component, I did not discuss or review with the  patient the possibility of revising her tibial component to an augmented  tibia.  I did also note that the tibial component  did appear to be  internally rotated to some degree.  The final 24 polyethylene insert was  then placed into the tibial tray and impacted in the location without  difficulty.  The knee was reduced.  The knee was again irrigated.  A medium  Hemovac drain was placed deep.  We reapproximated the extension mechanism  using a #1 Vicryl.   At this point, attention was directed to the skin closure which was closed  in multiple layers with 2-0 Vicryl and staples.  Given the debridement that  was carried, we were fortunate in her soft tissue laxity and subcutaneous  adipose layers that she was able to be mobilized adequately to get skin  edges approximated.   Following this, the wound was cleaned, dried, and dressed sterilely with  Xeroform dressing and a bulky sterile dressing.  She was then extubated and  brought to the recovery room in stable condition.      Madlyn Frankel Charlann Boxer, M.D.  Electronically Signed     MDO/MEDQ  D:  08/16/2006  T:  08/17/2006  Job:  [id number]

## 2011-03-05 NOTE — Consult Note (Signed)
NAMETERRIL, [name] NO.:  [id number]   MEDICAL RECORD NO.:  [id number]          [name] TYPE:  INP   LOCATION:  3005                         FACILITY:  [facility]   PHYSICIAN:  Graylin Shiver, M.D.   DATE OF BIRTH:  12-22-1945   DATE OF CONSULTATION:  DATE OF DISCHARGE:                                 CONSULTATION   REASON FOR CONSULTATION:  We were asked to see [name], today, by  Dr. Barnett Abu in consultation for right flank pain.   HISTORY OF PRESENT ILLNESS:  This is a 65 year old Caucasian female with  multiple orthopedic surgeries.  Two most recently on February 14 and  February 19.  She was suffering from severe right flank pain that  started approximately 1 week ago.  She has experienced chronic  constipation most of her life and became more constipated after her  recent surgery.  [name] states that she had a large bowel movement  Monday night; and felt like she had cleaned herself out.  She was  walking Gail halls on Tuesday feeling much better; Tuesday p.m. she felt  a sudden onset of severe pain in her right rear flank.  She has had no  bowel movement since Monday night.  She did vomit yellow fluid this a.m.  She reports positive esophageal reflux, that she has had for a long  period time.  She is negative for abdominal pain.  Negative for  hematemesis, melena, or hematochezia. She states that Gail pain is worse  with movement.  She has been eating solids, but her p.o. intake has  decreased sharply.  She is now receiving to ambulate due to her pain and  states that she is writhing in pain without her pain medications,  Dilaudid and Percocet.  It is notable that she experienced bilateral PEs  on February 25; and is now on Coumadin.   PAST MEDICAL HISTORY SIGNIFICANT:  1. Multiple spinal surgeries.  2. A total right knee replacement with revision.  3. She has had an EGD by Dr. Randa Evens in 2003 that showed a hiatal      hernia and reflux  disease.  4. She had a colonoscopy by Dr. Randa Evens in 2005 for anemia; it was      normal.  5. She has had carpal tunnel surgery.  6. Hypertension.  7. Hypothyroidism.  8. Anxiety.  9. Osteoarthritis.  10.She is status post cholecystectomy.  11.Status post hysterectomy.   PRIMARY CARE DOCTOR:  Stefani Dama, M.D.   ORTHOPEDIC SURGEON:  Madlyn Frankel. Charlann Boxer, M.D.   REVIEW OF SYSTEMS:  Per HPI.   SOCIAL HISTORY:  Negative tobacco, negative alcohol.   FAMILY HISTORY:  Negative for colon cancer, IBD and IBS.   CURRENT MEDICATIONS:  Per chart, but include:  Coumadin, Colace, and  MiraLax.  She is also on multiple narcotic pain medications.   ALLERGIES:  Gail chart reports that she has allergies to MORPHINE and  DEMEROL.  [name] reports to me that she is sensitive to [facility].   PHYSICAL EXAMINATION:  GENERAL:  On physical exam she is  alert and  oriented and in no apparent distress.  Her husband was present.  CARDIOVASCULAR SYSTEM:  A regular rate and rhythm.  LUNGS:  Clear to auscultation anteriorly.  ABDOMEN:  Soft, nontender, nondistended; and has good bowel sounds.  She  is severely tender to palpation in Gail rear right flank area.   CURRENT LABS:  Show a hemoglobin of 12.6, white count 12.3, hematocrit  36.3, and platelets 554.  PT 23.5, INR is 2.   CURRENT VITALS:  Temperature 97.5, pulse 64, respirations 12 and blood  pressure is 174/94.   X-RAY:  Done on 02/27 shows moderate constipation throughout Gail colon.  No dilated small bowel.  CT of her abdomen and pelvis done today showed  a moderate mass stool in her right colon, but also more defined fluid  collection posterior to L2.  Discussions with Dr. Evette Cristal and Gail  radiologist that this could be a source of her rear right flank pain.   ASSESSMENT:  [name] has been seen and examined and a history was  collected by Dr. Herbert Moors.  This is 65 year old female with severe  right flank pain, believed not to be of GI  origin.  [name] has  chronic constipation particularly in Gail right colon.  No further GI  testing plan at this time.  Please continue on MiraLax and Colace Thanks  very much for this consultation.  We will see [name], again, in Gail  morning.      Stephani Police, PA    ______________________________  Graylin Shiver, M.D.    MLY/MEDQ  D:  12/15/2006  T:  12/15/2006  Job:  [id number]   cc:   Stefani Dama, M.D.  Graylin Shiver, M.D.

## 2011-03-05 NOTE — Discharge Summary (Signed)
[name], [name]               ACCOUNT NO.:  [id number]   MEDICAL RECORD NO.:  [id number]          PATIENT TYPE:  INP   LOCATION:  1613                         [facility]ACILITY:  [facility]   PHYSICIAN:  Madlyn Frankel. Charlann Boxer, M.D.  DATE OF BIRTH:  12/02/1945   DATE OF ADMISSION:  04/10/2008  DATE OF DISCHARGE:  04/15/2008                               DISCHARGE SUMMARY   PRIMARY ADMITTING DIAGNOSIS:  Right patella fracture.   SECONDARY DIAGNOSES:  1. Osteoarthritis.  2. Hypertension.  3. Hypothyroidism.  4. Anemia.  5. Degenerative disk disease.  6. Rotator cuff tendonitis.   DISCHARGE DIAGNOSES:  1. Right patella fracture.  2. Osteoarthritis.  3. Hypertension.  4. Hypothyroidism.  5. Anemia.  6. Degenerative disk disease.  7. Rotator cuff tendonitis.   HISTORY OF PRESENT ILLNESS:  A 65 year old female with history of right  knee pain since last Friday with a history of right total knee  replacement in the past.  Seen in the office and x-ray revealed a right  patella fracture.  Due to significant amount of pain and inability to  walk was admitted for pain control as well as for INR check.   CONSULTATIONS:  Pharmacy for Coumadin.   PROCEDURE:  Revision of right patella with excision of patella fragments  by surgeon Dr. Durene Romans.   LABORATORY DATA:  Labs on admission CBC:  Hemoglobin 0.5, hematocrit  33.9, platelets 369.  At time of discharge:  Hemoglobin 9.6, hematocrit  28.5,  platelets 300.  Coagulation on admission:  INR was 2.39m, given  vitamin K.  It came down to 1.3 before surgery given to restart.  Postoperatively at time of discharge, she was therapeutic with INR 2.5  and PT of 27.4.  Routine chemistry on admission:  Sodium 134, potassium  4.1, glucose 126, BUN 29, creatinine 1.49.  At time of discharge:  Sodium was 138, potassium of 4.6, glucose 120, creatinine was 1.23.  GFR  was 44 at time of discharge with calcium of 9.2.   DIAGNOSTICS:  1. Cardiology EKG:  Normal  sinus rhythm.  2. Radiology:  No chest x-ray on this chart.   HOSPITAL COURSE:  The patient admitted for pain control and for vitamin  K administration to get her INR down.  Surgery was performed on April 11, 2008 by Dr. Charlann Boxer.  Seen postop day number 1, no events.  Her right  shoulder was sore.  She was afebrile.  INR was 1.1.  We discontinued the  Hemovac.  Dressing was to be changed the next day.  She was in a knee  immobilizer.  Day 2, doing well.  No complaints, afebrile.  INR was  coming up a little bit. Left calf was soft, nontender.  No significant  wound drainage.  Making minimal progress with physical therapy.  Next  day doing okay, pain was controlled, afebrile.  Incision was dry with no  serosanguineous ooze.  Calves soft, nontender.  She is neurovascularly  intact with this right lower extremity.  INR was near therapeutic.  Seen  on Mar 15, 2008, no complaints.  No events.  She was ready to go home.  She was afebrile.  INR was 2.5.  Dressing was changed.  Wound had no  drainage.  She was neurovascularly intact to her right lower extremity.  Was given a knee immobilizer for home.  She had some home health care  and physical therapy selected.   DISCHARGE DISPOSITION:  Discharged home in stable and improved  condition.   DISCHARGE INSTRUCTIONS:  1. Discharge physical therapy, weightbearing as tolerated with the use      of a rolling walker and a knee immobilizer.  2. Discharge wound care, keep dry.   DISCHARGE DIET:  Regular.   DISCHARGE MEDICATIONS:  1. Coumadin 5 mg p.o. daily.  2. Indomethacin 50 mg p.o. b.i.d.  3. Flexeril 10 mg p.o. t.i.d.  4. Xanax 0.5 mg p.o. b.i.d.  5. Atenolol 5 mg p.o. daily.  6. Benicar 20 mg p.o. q.h.s.  7. Omeprazole 20 mg 1-2 p.o. daily.  8. Synthroid 75 mcg p.o. daily.  9. Tricon 145 mg p.o. daily.  10.Lasix 60 mg p.o. daily.  11.Iron 325 mg p.r.n.  12.Colace 100 mg p.o. b.i.d.  13.Glucosamine MSM 1 p.o. b.i.d.  14.MiraLax 17 gm  p.o. q.a.m.  15.Calcium plus vitamin D p.o. b.i.d.  16.Lidoderm patch p.r.n.  17.Hydrocodone 7.5/650 one p.o. q.6-8 h. p.r.n. pain.   DISCHARGE FOLLOWUP:  Follow with Dr. Charlann Boxer at phone number [phone] in 2  weeks for wound check.     ______________________________  Yetta Glassman. [address], [place]      Madlyn Frankel. Charlann Boxer, M.D.  Electronically Signed    BLM/MEDQ  D:  05/07/2008  T:  05/07/2008  Job:  [id number]   cc:   Gwen Pounds, MD  Fax: [phone]   Stefani Dama, M.D.  Fax: [phone]   Francisca December, M.D.  Fax: [phone]

## 2011-03-05 NOTE — Op Note (Signed)
NAMESANGITA, ZANI                         ACCOUNT NO.:  [id number]   MEDICAL RECORD NO.:  [id number]                   PATIENT TYPE:  INP   LOCATION:  2861                                 FACILITY:  [facility]   PHYSICIAN:  Stefani Dama, M.D.               DATE OF BIRTH:  1946-09-04   DATE OF PROCEDURE:  10/29/2002  DATE OF DISCHARGE:                                 OPERATIVE REPORT   PREOPERATIVE DIAGNOSIS:  L3-L4 spondylosis and stenosis with lumbar  radiculopathy, status post arthrodesis, L4 to sacrum.   POSTOPERATIVE DIAGNOSIS:  L3-L4 spondylosis and stenosis with lumbar  radiculopathy, status post arthrodesis, L4 to sacrum.   PROCEDURE:  L3 laminectomy, posterior lumbar interbody fusion with T-lift  bone spacer and local autograft and allograft, pedicular fixation,  nonsegmental L3-L4 with posterolateral arthrodesis with autograft and  allograft. Removal of hardware L4-L5.   SURGEON:  Stefani Dama, M.D.   FIRST ASSISTANT:  Coletta Memos, M.D.   ANESTHESIA:  General endotracheal anesthesia.   INDICATIONS:  The patient is a 65 year old individual who has had  significant back and bilateral leg pain, worse over the past several months  time. She was found in November to have on her MRI severe stenosis at the L3-  L4 level immediately above her fusion from L4 to L5. She previously had an  arthrodesis from L5 to sacrum. The patient was  advised regarding surgical  decompression of this area with stabilization .   DESCRIPTION OF PROCEDURE:  The patient was brought to the operating room  supine on the stretcher. After the smooth induction of general endotracheal  anesthesia she was turned prone. The back was shaved, prepped with Duraprep  and draped in a sterile fashion.   An elliptical incision was made around her previous scar and this was  excised. Dissection was then carried to the lumbodorsal fascia which was  opened on either side of the midline to expose the  spinous process of L3 and  L4, and laterally to expose the hardware at the L4-L5 level.   Once this area was skeletonized, the set screws from this form of [employer]  instrumentation were loosened. The caps were removed. The rods were removed,  and then  sequentially the pedicle screws were removed at L4 and L5. The L4  screws had the holes protected.   The dissection was then carried out laterally to expose the transverse  processes of L4 and L3 superiorly. The laminar arch of L3 was dissected  free. A laminectomy then was created, removing the laminar arch of L3 to the  superior most aspect. The disk space at L3-L4 was exposed. The lateral  recesses and the L3 nerve root superiorly was exposed and decompressed. The  L4 inferiorly was also exposed and decompressed. Dissection was carried out  to either side of the midline to expose the nerve roots in the most lateral  aspect.  The common dural tube was well decompressed.   A total diskectomy was  then undertaken at L3-L4, removing the bony  endplates on either side. The disk space was noted to be severely narrowed.  A self-retaining intralaminar spreader was used to maintain distraction of  the interspace. With this then a diskectomy was carefully performed,  removing the endplates down to the anterior longitudinal ligament.   Once this area was decompressed and exposed, a bone graft consisting of the  laminar arch of the L3 vertebrae mixed with 15 cc of Vitoss and the  patient's own blood obtained from the pedicle probe on the left side at L3  was then mixed together and packed into the anterior portion of the disk  space. A 7 mm T-lift bone spacer was then placed ventrally, and then the  remainder of bone was packed into the disk space.   Pedicle probes were then placed into the L3 vertebrae pedicle entry site,  and radiographic confirmation of the pedicle probes was obtained along with  placement of screws in the L4 vertebrae. These  were 7 x 40 mm Clix screws  from the [employer] set. Then 6.2 x 40 mm screws were placed in L3; 50 mm  straight rods were used to connect the screw caps which were placed after  the heads were reamed, and prior to  final tightening, the lateral recesses  were packed with the same mixture of autograft and allograft after  decortication of this bone was performed.   During this procedure Dr. Franky Macho helped with providing exposure and doing  the contralateral sided decompression as the procedure evolved. In the end  with the bone being packed bilaterally, the rods were fixed into final  position in a compression construct, and final radiographic appearance of  the construct was obtained and this was verified as good.   The wound was irrigated copiously with antibiotic irrigating solution. Soft  hemostasis was obtained and then the lumbodorsal fascia was closed with #1  Vicryl in an interrupted fashion, 2-0 Vicryl was used subcutaneously, 3-0  Vicryl was used subcuticularly.   The patient tolerated the procedure well. She was returned to the recovery  room in stable condition.                                                  Stefani Dama, M.D.    [name]  D:  10/29/2002  T:  10/29/2002  Job:  [id number]

## 2011-03-05 NOTE — Discharge Summary (Signed)
[name], [name]               ACCOUNT NO.:  [id number]   MEDICAL RECORD NO.:  [id number]          PATIENT TYPE:  INP   LOCATION:  0473                         FACILITY:  [facility]   PHYSICIAN:  Georges Lynch. Gioffre, M.D.DATE OF BIRTH:  12-May-1946   DATE OF ADMISSION:  10/22/2004  DATE OF DISCHARGE:  10/27/2004                                 DISCHARGE SUMMARY   ADMISSION DIAGNOSES:  1.  Degenerative arthritis, right knee.  2.  Hypertension.  3.  Gastroesophageal reflux disease.  4.  Hiatal hernia.  5.  History of kidney stones.   DISCHARGE DIAGNOSES:  1.  Degenerative arthritis, right knee, status post right total knee      arthroplasty.  2.  Hypertension.  3.  Gastroesophageal reflux disease.  4.  Hiatal hernia.  5.  Kidney stones.  6.  Postoperative anemia.   PROCEDURES:  The patient was admitted to [facility]. She was taken  to the operating room on October 22, 2004, to  undergo a right total knee  arthroplasty. Surgeon, Dr. Ranee Gosselin. Assistant, Ebbie Ridge. Paitsel, PA-C.  Surgery was performed under general anesthesia. Hemovac drain was placed at  the time of surgery.   CONSULTATIONS:  Internal medicine, Dr. Timothy Lasso; PT/OT rehab.   BRIEF HISTORY:  This patient is a 65 year old female with right knee pain  for several months, increasing pain with ambulation. The patient had right  knee arthroscopy in February 2005 which revealed marked degenerative changes  primarily at the medial joint. The patient had no relief with cortisone  injections, Steri-Pred Dosepak, or nonsteroidal anti-inflammatory  medications, and elected to proceed with a right total knee arthroplasty.  The patient was admitted to the hospital for same.   LABORATORY DATA:  Admission CBC revealed WBC of 5.9, RBC 4.44, hemoglobin  12.5, hematocrit 37.4, platelet count 463,000 (slightly elevated). Admission  chemistry revealed sodium of 137, potassium 4.1, chloride 101, CO2 28,  glucose 104  (slightly elevated), BUN 21, creatinine 1.4, calcium 10.3, total  protein 7.5, albumin 4.4. Urinalysis revealed trace leukocytes. Microscopic  exam 0-2 wbc's per high power field. She had a few hyaline casts and mucus.  The patient's blood type was A positive, negative antibody screen.  Preoperative EKG revealed normal sinus tachycardia at a rate of 105;  otherwise normal. Preoperative x-ray of right knee revealed moderate to  severe degenerative changes. Preoperative chest x-ray revealed no acute  abnormality. Postoperative x-ray of right knee showed satisfactory alignment  of right total knee arthroplasty.   HOSPITAL COURSE:  The patient was admitted to [facility]. She was  taken to the operating room. She underwent the above-stated procedure  without complications. She tolerated the procedure well and was allowed to  return to the recovery room and then to orthopedic floor to continue  postoperative care. Hemovac drain was placed at the time of surgery. The  Hemovac drain was discontinued on postoperative day one. The patient's  hemoglobin and hematocrit were followed throughout her hospitalization. She  did have a postoperative drop in her hemoglobin to 7.6, which required two  units of packed  red cells. The patient's hemoglobin stabilized prior to  discharge. The patient was placed on PC analgesia for pain control. The  patient's pain was well controlled and she was weaned over to oral  analgesics. By postoperative day three, the PCA was discontinued. On  postoperative day one, the patient experienced some lightheadedness, but  denied shortness of breath or chest pain. At that time Dr. Timothy Lasso was  notified, orders were given. The patient experienced postoperative anemia.  Hemoglobin dropped to 7.6 and she was given two units of packed red cells.  Once the patient received a blood transfusion she became less symptomatic  and gradually was able to increase her ambulation. The  patient also  developed some hypotension which Dr. Timothy Lasso felt was indicative of her  postoperative anemia. He followed her closely for that. By postoperative day  three, the patient felt much better, was encouraged to ambulate with PT. The  patient was a little slow to progress, but she gradually improved and was  able to weight bear with the aid of therapist and a walker. The patient was  discharged home on October 27, 2004.   DISCHARGE MEDICATIONS:  1.  Robaxin 500 mg one q.6h. p.r.n. spasm.  2.  Coumadin 5 mg one daily.  3.  Percocet 10/650 mg one or two q.6h. p.r.n. pain.  4.  The patient will resume her home medications except for the      indomethacin.   ACTIVITY:  Full weightbearing with walker. [name] will provide her home  care.   DIET:  As tolerated.   WOUND CARE:  The patient is to keep the wound dry and clean, daily dressing  changes.   FOLLOWUP:  The patient will follow up with Dr. Darrelyn Hillock two weeks from the  date of the surgery and she will call the office to schedule the  appointment.   CONDITION ON DISCHARGE:  Stable.      LKP/MEDQ  D:  11/25/2004  T:  11/25/2004  Job:  [id number]

## 2011-03-05 NOTE — Discharge Summary (Signed)
[name], [name]               ACCOUNT NO.:  [id number]   MEDICAL RECORD NO.:  [id number]          PATIENT TYPE:  INP   LOCATION:  1607                         FACILITY:  [facility]   PHYSICIAN:  Madlyn Frankel. Charlann Boxer, M.D.  DATE OF BIRTH:  1946-05-27   DATE OF ADMISSION:  01/30/2008  DATE OF DISCHARGE:  02/02/2008                               DISCHARGE SUMMARY   ADMISSION DIAGNOSES:  1. Osteoarthritis of the right knee.  2. Right knee neuroma.  3. Hypertension.  4. Hypothyroidism.  5. Anemia.  6. Degenerative disk disease.   DISCHARGE DIAGNOSES:  1. Osteoarthritis.  2. Right knee neuroma.  3. Hypertension.  4. Hyperthyroidism.  5. Anemia.  6. Degenerative disk disease.  7. Acute blood loss anemia.   HISTORY OF PRESENT ILLNESS:  [name] is a 65 year old female with a  history of right total knee replacement with subsequent persistent right  anterior knee pain unrelieved by all conservative treatments.  In  addition, she had progressive left hip pain secondary to osteoarthritis.  Both were refractory to conservative treatment.   CONSULTANTS:  1. Pharmacy for Coumadin management.  2. Internal medicine Dr. Creola Corn for medical management.   PROCEDURE:  1. A right neuroma excision .  2. Left total hip arthroplasty.  Surgeon Dr. Durene Romans , assistant Dwyane Luo, PA-C.   LABS:  Preadmission CBC, hemoglobin and hematocrit respectively were  11.3 and 32.3, platelets 377. At time of discharge after 2 units of  packed red blood cells, her hemoglobin and hematocrit were 9.4 and  27.2  and platelets 231.  Her white cell differential on preadmission was all  normal.  Her coags  preadmission, PT 23.5, INR 2, PTT 42. At time of  surgery, her INR was back down to 1, PT was 12.9, PTT was 28 and her  Coumadin was restarted prior to discharge and her PT at time of  discharge was 16.7 and INR 1.3.  CMET preadmission, sodium 137,  potassium 5.3, glucose 100, creatinine 1.46.  At time of  discharge,  tracked throughout her sodium was 132, potassium 4.4, glucose 116,  creatinine 1.6 and her BUN was 32.  Her GFR prior to admission was 36,  calcium 10.1. At time of discharge, GFR was 33, calcium 9.1.  GI workup  showed her total protein to be 5.1, albumin 2.6, all others normal. At  time of discharge, total protein 5.2, albumin 2.5, all others normal.  Urinalysis showed a trace leukocyte esterase and  negative nitrites.  Two units packed red blood cells were transfused to the patient while  she was in the hospital.   CARDIOLOGY:  EKG normal sinus rhythm.   RADIOLOGY:  No chest x-ray found in chart.   HOSPITAL COURSE:  The patient admitted to the hospital and underwent a  right knee neuroma excision and a left total hip arthroplasty, admitted  to the orthopedic floor.  Pharmacy managed Coumadin dosing. Dr. Timothy Lasso  followed her medically, managed her multiple medical conditions with no  significant changes of fluctuations.  Orthopedically she did well, did  receive some blood. She remained  afebrile despite the lack of sleep the  first couple of nights. Her dressing was changed after day one with no  significant drainage from the wound.  She was neurovascularly intact to  the left lower extremity. Her right knee wound looked great throughout  as well.  Dressing was changed prior to discharge.  She was  weightbearing as tolerated, made moderate progress.  Her renal function  remained stable.  Her INR increased prior to discharge and was 1.3 and  was bridged with Lovenox.  Day three she was afebrile, hemodynamically  stable with plans to follow up with Dr. Charlann Boxer as well as Dr. Timothy Lasso to  help with fluid retention as well as renal function.   DISCHARGE DISPOSITION:  Discharged home with home health care PT in  stable and improved condition.   DISCHARGE DIET:  Regular.   DISCHARGE WOUND CARE:  Keep dry.   DISCHARGE MEDICATIONS:  1. No Benicar or Avapro.  2. Lasix 40 mg  daily.  3. Lovenox 40 mg subcu q. 24 x1 day.  4. Coumadin per previous schedule which was 5 mg tablets for 4 days      and then 2.5 mg tablets for 3 days. Will follow up with Dr. Timothy Lasso      to assure proper dosage amount.  5. Robaxin 500 mg p.o. q.6 p.r.n.  6. Synthroid 75 mcg daily.  7. Flexeril 10 mg p.r.n. hold while on Robaxin.  8. Xanax 0.5 mg b.i.d.  9. Calcium 600 plus D b.i.d.  10.Multivitamin daily.  11.Ambien 10 mg q.h.s.  12.Atenolol 50 mg daily.  13.Neurontin 600 mg t.i.d.  14.Tricor 145 mg daily.  15.Vicodin 7.5/650 one q.4-6 p.r.n.  16.Omeprazole 20 mg daily.  17.Iron 125 mg daily.  18.Colace 100 mg b.i.d.  19.MiraLax 17 grams daily.   DISCHARGE FOLLOWUP:  1. Follow up with Dr. Charlann Boxer in 2 weeks at [phone].  2. Follow-up with Dr. Timothy Lasso on the Monday following her discharge      date.     ______________________________  [name]. [address], [place]      Madlyn Frankel. Charlann Boxer, M.D.  Electronically Signed    BLM/MEDQ  D:  02/27/2008  T:  02/27/2008  Job:  [id number]   cc:   Gwen Pounds, MD  Fax: [phone]   Stefani Dama, M.D.  Fax: [phone]   Francisca December, M.D.  Fax: [phone]

## 2011-03-05 NOTE — H&P (Signed)
[name], [name]               ACCOUNT NO.:  [id number]   MEDICAL RECORD NO.:  [id number]          PATIENT TYPE:  INP   LOCATION:  NA                           FACILITY:  [facility]   PHYSICIAN:  Georges Lynch. Gioffre, M.D.DATE OF BIRTH:  1946-10-01   DATE OF ADMISSION:  DATE OF DISCHARGE:                                HISTORY & PHYSICAL   HISTORY:  The patient has had right knee pain for the past several months.  She is having increasing pain with ambulation.  She had a right knee  arthroscopy in February 2005, which revealed marked degenerative changes  (primarily medial joint).  She has had no relief with cortisone injections  and Sterapred dose pack; elects to proceed with a right total knee  arthroplasty.   ALLERGIES:  MORPHINE, DEMEROL (cause rash).   PRIMARY CARE PHYSICIAN:  Gwen Pounds, MD   PAST MEDICAL HISTORY:  1.  Hypertension.  2.  Gastroesophageal reflux disease.  3.  Hiatal hernia.  4.  Kidney stones.   CURRENT MEDICATIONS:  1.  Synthroid 0.75 mcg daily.  2.  Indocin 50 mg b.i.d.  3.  Lasix 60 mg q.d.  4.  Neurontin 600 mg t.i.d.  5.  Benicar 20 mg q.d.  6.  Alprazolam 0.5 mg b.i.d. p.r.n.   PAST SURGICAL HISTORY:  1.  The patient had right knee arthroscopy February 2005.  2.  She has had 11 back surgeries, including a fusion with Dr. Danielle Dess.  3.  Cholecystectomy.  4.  Partial hysterectomy.  5.  Carpal tunnel release of both hands.  6.  Tonsillectomy.   FAMILY HISTORY:  Mother with heart disease, diabetes. Father with heart  disease, hypertension, diabetes, arthritis.  Sister had a stroke at age 34.  Grandmother had cancer, type unknown.   REVIEW OF SYSTEMS:  GENERAL:  Denies weight change, fever, chills, fatigue.  HEENT:  Denies headache, visual changes, __________ or sore throat.  CARDIOVASCULAR:  Denies chest pain, palpitations, shortness of breath or  orthopnea.  PULMONARY:  Denies dyspnea, recent cough, sputum production,  hemoptysis.  GI:  Denies  dysphagia, nausea, vomiting, hematemesis or  abdominal pain.  GENITOURINARY:  Denies dysuria, frequency, urgency,  hematuria.  ENDOCRINE:  Denies polyuria, polydipsia, appetite change, heat  or cold intolerance.  MUSCULOSKELETAL:  The patient has right knee pain.  NEUROLOGIC:  Denies dizziness, vertigo, syncope, seizures.  SKIN:  Denies  itching, rashes, masses or moles.   PHYSICAL EXAMINATION:  VITAL SIGNS:  Temperature 98.1, pulse 92,  respirations 15, blood pressure 150/100 (left arm sitting).  GENERAL:  A 65 year old female in no acute distress.  HEENT:  PERRL, EOM's intact.  Pharynx clear.  NECK:  Supple without masses.  CHEST:  Clear to auscultation bilaterally.  No wheezes, rales or rhonchi  noted.  HEART:  Regular rate and rhythm without murmur.  ABDOMEN:  Positive bowel sounds; soft and nontender.  EXTREMITIES:  Examination of the right knee reveals she has a very large  knee (difficult to examine).  There is a lot of crepitus with range of  motion and pain.  SKIN:  Warm and dry.   DIAGNOSTIC TESTING:  X-ray reveals degenerative arthritis of right knee.   IMPRESSION:  Degenerative arthritis of right knee.   PLAN:  The patient is to be admitted to [facility] on October 22, 2004 to undergo a right total knee arthroplasty.      LKP/MEDQ  D:  10/20/2004  T:  10/20/2004  Job:  [id number]

## 2011-03-05 NOTE — Consult Note (Signed)
NAMEKELLEEN, STOLZE                         ACCOUNT NO.:  [id number]   MEDICAL RECORD NO.:  [id number]                   PATIENT TYPE:  EMS   LOCATION:  MINO                                 FACILITY:  [facility]   PHYSICIAN:  Stefani Dama, M.D.               DATE OF BIRTH:  [birth year]   DATE OF CONSULTATION:  09/25/2002  DATE OF DISCHARGE:                                   CONSULTATION   REQUESTING PHYSICIAN:  Devoria Albe, M.D.   REASON FOR CONSULTATION:  Acute right hip pain.   HISTORY OF PRESENT ILLNESS:  The patient is a 65 year old right-handed  individual who is well known to my service having  had significant lumbar  decompression, arthrodesis at L4-5 and 5-1 in the past. She has recently  been evaluated and likely decompression with stabilization at L3-L4. This  evening around 5:30 she was standing in her kitchen when she noted the  sudden and severe onset of right hip pain radiating through the region of  the buttock  and into the hip  itself. She denied radiation below the level  of the mid thigh. She denied any numbness and tingling in the leg. She  contacted my office  by phone after hours and I discussed the situation with  her and I advised that she come to the emergency room to undergo  x-ray  evaluation of the right hip and further treatment with pain medication. The  patient already used hydrocodone at home for chronic back pain.   The patient denied any history of trauma. She notes that the onset of pain  was purely spontaneous when she went to move about while in the standing  position.   PAST MEDICAL HISTORY:  As noted she has had previous lumbar fusions at L4-5  and L5-S1. She has had previous cervical fusion at C4-5 and C5-6.   CURRENT MEDICATIONS:  Include hydrocodone for pain  among other medications.   PHYSICAL EXAMINATION:  She is alert and oriented and rather uncomfortable  with right hip pain. The patient on the greater trochanter reproduces acute  pain. She has a positive Patrick sign on the right side. Her motor strength  otherwise in the iliopsoas quad to the anterior gastrocnemius save for the  overlying pain appears intact. Reflexes are 2+ in the patella and the  Achilles bilaterally. Sensation is intact to vibration distally in the lower  extremity. She is able to bear weight onto that right leg.   LABORATORY DATA:  An  x-ray of the AP pelvis and the right hip was obtained  and demonstrates that  both hip joints are intact and  fairly normal. No  evidence of fracture was identified.   IMPRESSION:  The patient has acute right hip pain, etiology unknown.   PLAN:  I suggested at this time that we treat this with a strong pain  medication. As she has already had  60 mg of Toradol, she will be given  Demerol 100 mg with Vistaril 50 mg, and will be sent home. I have advised  her she can put some heat on the  region of the hip. We will see how she  does in the next day or two. She has been told to contact my  office if  further intervention is necessary.                                              Stefani Dama, M.D.   [name]  D:  09/25/2002  T:  09/26/2002  Job:  [id number]

## 2011-03-05 NOTE — Op Note (Signed)
NAMEDULCY, SIDA               ACCOUNT NO.:  [id number]   MEDICAL RECORD NO.:  [id number]          PATIENT TYPE:  INP   LOCATION:  X003                         [facility]AC[facility]   PHYSICIAN:  Georges Lynch. Gioffre, M.D.DATE OF BIRTH:  [birth year]   DATE OF PROCEDURE:  10/22/2004  DATE OF DISCHARGE:                                 OPERATIVE REPORT   PREOPERATIVE DIAGNOSIS:  Severe degenerative arthritis of the right knee.   POSTOPERATIVE DIAGNOSIS:  Severe degenerative arthritis of the right knee.   OPERATION PERFORMED:  Right total knee arthroplasty utilizing [employer]  system.  I utilized a size 7 right posterior cruciate sacrificing femoral  component, utilized a size 26 recessed patella.  I utilized a size 7 tibial  tray with an 18 mm thickness tibial insert and we utilized an 17 mm x 80 mm  in length fluted stem for the tibial component.  All three components were  cemented and vancomycin was used in the cement.   SURGEON:  Georges Lynch. Darrelyn Hillock, M.D.   ASSISTANT:  Ebbie Ridge. Paitsel, P.A.   ANESTHESIA:  General.   DESCRIPTION OF PROCEDURE:  Under general anesthesia, routine orthopedic prep  and drape in the right lower extremity carried out.  At this time the leg  was exsanguinated with an Esmarch, tourniquet was elevated 400 mmHg.  The  knee was flexed and incision was made over the anterior aspect of the right  knee.  Bleeder was identified and cauterized.  Self-retaining retractor was  inserted.  Following this, I then went down and carried out a median  parapatellar approach, reflected the patella laterally, flexed the knee and  did lateral medial meniscectomy to excise the anterior and posterior  cruciate ligaments.  Following this, initial drill cut hole was made in the  intercondylar notch of the femur.  The intramedullary guide rod was inserted  and 10 mm thickness was taken off by utilizing the #1 jig.  Following this,  a #2 jig was inserted for a size 7 right femur and I  carried out anterior  posterior chamfering cuts.  We then prepared the tibia in the usual fashion  but we utilized the size 4 stylus for the tibial cut and made our typical  tibial cut for a size 7.  We then utilized a size 7 tibial tray.  Note we  did use the intramedullary device for the cut that we made.  Following this,  we then went on and made our notch cut out of our patellar notch cut and our  intercondylar notch cut out of the femur for a size 7 posterior cruciate  sacrificing femoral component.  We then went through the trials and we  worked our way up to an 18 mm thickness tibial insert.  Following this, we  then went back and prepared our tibia.  We utilized the fluted stem.  We  first reamed out the tibia up to a size 17 mm diameter and an 80 mm depth.  We then made our keel cut in the usual fashion.  We then went through trials  with the  7 tibial component and following this, we then removed all the  trials and irrigated the knee out, dried the knee out and then cemented all  three components in simultaneously.  Note the patella was prepared in the  usual fashion.  We removed 10 mm thickness off the patella.  Three drill  holes were made in the patella for recessed patella.  Patella size was size  26 mm.  We thoroughly irrigated everything out, dried the area out and  cemented all three components in simultaneously.  I then went on inserted  the Hemovac drain, closed the knee in layers in the usual fashion.  Note I  did insert some Thrombin soaked Gelfoam for hemostasis purposes.  Her thigh  was quite large.  We released the tourniquet prior to closing the wound.  Total tourniquet time was approximately two hours.  The patient was given 1  g of IV Ancef preop.      RAG/MEDQ  D:  10/22/2004  T:  10/22/2004  Job:  [id number]

## 2011-03-05 NOTE — Consult Note (Signed)
[name]  Patient:    [name], [name]                      MRN: [id number] Proc. Date: 05/18/00 Adm. Date:  [id number] Attending:  Fritzi Mandes CC:         Coralee North, M.D.                          Consultation Report  REFERRING PHYSICIAN:  Coralee North, M.D.  REASON FOR REQUEST:  Neck, shoulder an arm pain.  HISTORY OF PRESENT ILLNESS:  The patient is a 65 year old right-handed individual who is well known to my service, having had a number of operations secondary to cervical spondylosis and lumbar spondylosis.  She has a solid arthrodesis in the lumbar spine from L3 to the sacrum.  She has had previous cervical surgery including an arthrodesis, most recently in July 2000 at C4-5 of an anterior diskectomy with titanium plating.  She has done reasonably well, though the patient has chronic low back pain and thoracic pain, and is maintained with the use of hydrocodone and occasional Flexeril.  Nonetheless, she had a severe exacerbation of pain some time around 2:00 a.m. which radiated into her jaw, through her anterior chest and into both shoulders. She was feeling quite incapacitated by the pain.  Because of the concern that this could be of cardiac nature, she was seen by Dr. Criss Alvine, and is now admitted for further evaluation of any cardiac possibility in regards to this pain.  Since its initial severe onset, the pain has diminished considerably, but she still has significant shoulder pain, particularly on the right side, with radiation of the pain into the proximal arm.  She also has had increasing and severe amounts of headache during the day and throughout the last several days.  She denies any numbness or tingling in her fingers other than some tingling in her left fingers, which she keeps from carpal tunnel syndrome that exists there.  Her lower extremity function or sensation have been been affected.  Bowel and bladder control have been  intact.  No x-rays of the spine have been obtained since this hospitalization.  PAST MEDICAL HISTORY, SOCIAL HISTORY, AND REVIEW OF SYSTEMS:  Remain fairly stable from her last office visit.  She notes that her medication list has remained stable.  CURRENT MEDICATIONS:  Vicodin 7.5/750, 1-3 q.d.  Baclofen 10 mg b.i.d. Alprazolam 25 mg b.i.d.  Synthroid 75 mcg q.d.  Premarin, Indomethacin, Prevacid, multivitamins, vitamin E, potassium gluconate, calcium, vitamin B complex.  Cozaar 100 mg q.d.  ALLERGIES:  No allergies to any specific medication.  PHYSICAL EXAMINATION:  GENERAL:  Alert, oriented and cooperative individual in no overt distress.  NECK:  Range of motion is good, turning to the right to 60 degrees before she gets pain in the right shoulder and proximal arm.  She turns to the left to 70 degrees unimpeded.  She extends and flexes quite well.  Axial compression does not reproduce significant pain.  No masses are palpable in the neck, either anteriorly or posteriorly.  There is, however, some spasm in the trapezius, worse on the right than on the left.  Motor strength in the upper extremities is normal in the deltoid, biceps, triceps, grip and intrinsic.  Tone and bulk are normal.  Cranial nerves examination reveals the pupils are 2 mm, briskly reactive to light and accommodation.  Extraocular movements  are full.  Face is symmetric to grimace.  Tongue and uvula are in the midline.  sclerae and conjunctivae are clear.  Gait and station are normal.  Deep tendon reflexes are 1+ in both biceps, 2+ in the triceps, 2+ in the patellae, absent in both Achilles.  Babinskis are down going.  Sensation is intact distally in the upper extremities to pin and vibration.  IMPRESSION:  The patient has evidence of spondylitic disease of the cervical spine and lumbar spine by history.  Currently, her vital signs have been stable and her neurologic exam is normal.  Nonetheless, she keeps  a significant amount of pain in the region of the right shoulder.  With her history of multilevel spondylitic degeneration of the cervical spine, I believe that he best and quickest way to work her up would be with an MRI of the cervical spine.  Though she is claustrophobic, I have advised that she can have some Versed so that she may undergo an MRI.  I will be ordering this today. DD:  05/18/00 TD:  05/19/00 Job: [id number]

## 2011-03-05 NOTE — Op Note (Signed)
NAME:  [name], [name]                         ACCOUNT NO.:  [id number]   MEDICAL RECORD NO.:  [id number]                   PATIENT TYPE:  AMB   LOCATION:  ENDO                                 FACILITY:  [facility]   PHYSICIAN:  James L. Malon Kindle., M.D.          DATE OF BIRTH:  Jun 28, 1946   DATE OF PROCEDURE:  05/14/2004  DATE OF DISCHARGE:                                 OPERATIVE REPORT   PROCEDURE PERFORMED:  Colonoscopy.   ENDOSCOPIST:  Llana Aliment. Edwards, M.D.   MEDICATIONS:  Fentanyl 100 mcg, Versed 10 mg IV.   INDICATIONS FOR PROCEDURE:  Anemia.   DESCRIPTION OF PROCEDURE:  The procedure had been explained to the patient  and consent obtained.  With the patient in the left lateral decubitus  position, the Olympus scope was inserted and advanced.  The prep was  excellent and we were able to easily reach the cecum.  The ileocecal valve  and appendiceal orifice were seen.  The scope was withdrawn and the cecum,  ascending colon, transverse colon, splenic flexure, descending and sigmoid  colon were seen well.  There were several small lymphoid nodules but no  polyps seen.  The rectum was free of polyps or other lesions.  The scope was  withdrawn.  The patient tolerated the procedure well.   ASSESSMENT:  Anemia, 2800.  Colonoscopy negative.   PLAN:  Will start the patient on iron, see her back in the office in four to  six weeks, will continue to follow stools.                                               James L. Malon Kindle., M.D.    Waldron Session  D:  05/14/2004  T:  05/14/2004  Job:  [id number]   cc:   Gwen Pounds, M.D.  Fax: [phone]   Juluis Mire, M.D.  [address] [place]  [place] [address]  Fax: [phone]

## 2011-03-05 NOTE — H&P (Signed)
NAME:  [name], [name]                         ACCOUNT NO.:  [id number]   MEDICAL RECORD NO.:  [id number]                   PATIENT TYPE:  INP   LOCATION:  NA                                   FACILITY:  [facility]   PHYSICIAN:  Stefani Dama, M.D.               DATE OF BIRTH:  [birth year]   DATE OF ADMISSION:  DATE OF DISCHARGE:                                HISTORY & PHYSICAL   PREOPERATIVE DIAGNOSIS:  Spondylosis and stenosis, L3-L4, with lumbar  radiculopathy.   HISTORY OF PRESENT ILLNESS:  The patient is a 65 year old individual who has  had significant problems with back and bilateral leg pain.  An MRI was  performed around the end of November and she was found to have significant  spondylitic stenosis at the L3-L4 level.  She has had previous arthrodesis  at L4-L5 and appears to have a transitional level at the sacrum.  Last visit  was around the beginning of December and we discussed surgical intervention  regarding decompression of the L3-L4 level be done with bone spacers and  pedical screw fixation; she is now being admitted for this procedure.  She  notes in the past month that she has had a severe exacerbation and  persistence of the pain primarily in the hips, more so on the right than on  the left, but it has become progressively more difficult to treat.  After  careful consideration, we decided to proceed with this surgical procedure to  remove hardware from L4-L5 and decompress L3-L4 and then place pedicle screw  fixation along with bone grafts at the L3-L4 level.   PAST MEDICAL HISTORY:  Her past medical history notes that she has had  previous lumbar fusions in 1997.  She has had cervical spondylosis and has  had anterior cervical diskectomy at C4-C5 performed in October of 2000.  She  had signs and symptoms related to a fibromyalgia syndrome.  She has  hypothyroidism and she is currently followed by Dr. Gwen Pounds at  [facility].   MEDICATIONS:  1.  Prevacid 30 mg b.i.d.  2. Hydrocodone on a p.r.n. basis.  3. Vioxx 50 mg a day.  4. Baclofen 10 mg b.i.d.  5. Alprazolam 0.5 mg b.i.d.  6. Cozaar 100 mg daily.  7. Baby aspirin a day.  8. Synthroid 0.75 mg a day.  9. Zelnorm 6 mg b.i.d.  10.      Lasix 20 mg daily.  11.      Neurontin 300 mg at bedtime.  12.      Multivitamins.  13.      Vitamin E.  14.      She uses Ambien on a p.r.n. basis for sleep.   SOCIAL HISTORY:  Social history reveals that she is married.  She has been  disabled with her back and her legs for a number of years.   REVIEW OF  SYSTEMS:  Systems review is positive for items in the history of  present illness.   PHYSICAL EXAMINATION:  GENERAL:  Physical examination reveals that she is an  alert and oriented, cooperative individual in no overt distress.  NEUROMUSCULAR:  She will stand straight and erect with some difficulty.  Her  motor strength in the upper extremities reveals good strength, tone and bulk  to confrontational testing in the deltoids, biceps, triceps, grips and  intrinsics.  In the lower extremities, she has good strength in the  iliopsoas, the quads, tibialis anterior and gastrocnemii, though she moves  with great pain.  Straight leg raising at 30 degrees reproduces back pain  and leg pain bilaterally.  Patrick's maneuver reproduces hip pain  bilaterally, though the range of motion is good.  Deep tendon reflexes are  2+ in the patellae, 1+ in the Achilles.  Babinski's are downgoing.  Sensation is intact distally in the lower extremities.   IMPRESSION:  The patient has evidence of significant spondylitic disease in  the lumbar spine with stenosis at the L3-4 level above her arthrodesis.  We  discussed the significance of this junctional syndrome and she is now being  admitted to undergo surgical decompression of the same.                                               Stefani Dama, M.D.    [name]  D:  10/29/2002  T:  10/29/2002  Job:   [id number]

## 2011-03-05 NOTE — Discharge Summary (Signed)
[name], [name]               ACCOUNT NO.:  [id number]   MEDICAL RECORD NO.:  [id number]          PATIENT TYPE:  INP   LOCATION:  3005                         FACILITY:  [facility]   PHYSICIAN:  Stefani Dama, M.D.  DATE OF BIRTH:  May 26, 1946   DATE OF ADMISSION:  12/01/2006  DATE OF DISCHARGE:  12/21/2006                               DISCHARGE SUMMARY   ADMITTING DIAGNOSES:  1. Spondylosis T10-L2 with stenosis L1-L2 and lumbar radiculopathy,      status post arthrodesis L2 to sacrum, secondary to severe      spondylosis and stenosis with recurrent radiculopathy.  2. Diagnosis is morbid obesity.  3. He is status post knee arthroplasty.  4. Osteoarthritis.   DISCHARGE AND FINAL DIAGNOSES:  Include:  1. Spondylosis and stenosis, L1-L2 with spondylosis T10-L2, status      post arthrodesis, L2 to sacrum, secondary to severe spondylosis      stenosis with recurrent radiculopathy.  2. Status post knee arthroplasty.  3. Intractable lumbar radicular pain.  4. Paralytic ileus.  5. Lumbar wound hematoma.  6. Severe constipation with large bowel impaction.  7. Peripheral vascular disease.  8. Pulmonary embolism.  9. Coumadin anticoagulation, secondary to #8.  10.Acute blood loss anemia.   HOSPITAL COURSE:  [name] is a 65 year old individual who had  an extremely complicated hospital course, after being admitted for  decompression of her thoracolumbar spine.  She had had previous  spondylitic disease, which resulted in multiple surgeries up to the L2  level, initially started at L4-5.  She subsequently required a fusion of  L4-5 and then a fusion of L4 to the sacrum and a fusion of L3-4 and more  recently L2-3.  She had deteriorated the L1-2 junction and was taken to  the operating room at this time to undergo fusion from T10-L2, with  stabilization placed posteriorly.  The patient tolerated this procedure  well.  Initially, however, she complained of severe intractable  pain in  her back and her left lower extremity.  Further workup ultimately  resulted in the performance myelogram several days later, which showed  what was suspected to be a small epidural hematoma.  Because the patient  was in such severe pain, she was returned to the operating room.  Hematoma was decompressed, and this seemed to make a positive impact on  the patient's left sided pain.  Now, she complains of right-sided pain  in the right flank.  It was noted that she had a significant fecal  impaction, secondary to a profound ileus.  This was cleared with the use  of enemas; however, it did not seem to impact the right flank pain and  was quite severe.  The pain had a colicky nature.  It was suspected that  the patient may have renal stone; however, this was not found on  subsequent CT scans of the abdomen.  She was noted on her 12th  postoperative day to have some decreased saturations of her oxygen and  her blood, despite the use of oxygen and a CT workup, demonstrated a  presence of  pulmonary embolus.  She was started on Coumadin  anticoagulation.  Because of the severity of the pain, a CT of the  lumbar spine was subsequently performed which showed good placement of  the hardware, and it was felt that she may respond positively to the use  of trans-foraminal epidural injection.  However, because she was on  Coumadin, this medication had to be stopped.  A filter was put in place,  and the patient was subsequently sent to radiology for an epidural  steroid injection which did give her some relief.  Because pain control  was such a difficult issue, the patient was ultimately seen by  palliative medicine, and with this she was placed on some oral  medication in the form of Dilaudid, which seemed to control her pain  well.  During this entire time, the patient was followed by Dr. Emilia Beck from internal medicine who helped guide the treatment and  workup of the ileus and also did  substantial workup for other medical  problems, including the pulmonary embolus and renal stone.  The patient  was also seen by Dr. Derenda Mis from palliative medicine, to help  with the medication management.  At the time of discharge, she is on  Coumadin and will be maintained on anticoagulation as prescribed per Dr.  Emilia Beck.  The patient is discharged home on MS Contin 15 mg  b.i.d., plus MSIR for breakthrough pain.  She will be seen in the office  for follow-up by me, in approximately 3 weeks' time.  Her incision is  clean and dry, and she appears to be improving steadily in her ability  to ambulate independently.   CONDITION ON DISCHARGE:  Improving.      Stefani Dama, M.D.  Electronically Signed     HJE/MEDQ  D:  02/03/2007  T:  02/03/2007  Job:  [id number]

## 2011-03-05 NOTE — Op Note (Signed)
NAMEORIAH, LEINWEBER               ACCOUNT NO.:  [id number]   MEDICAL RECORD NO.:  [id number]          PATIENT TYPE:  INP   LOCATION:  2899                         FACILITY:  [facility]   PHYSICIAN:  Stefani Dama, M.D.  DATE OF BIRTH:  [birth year]   DATE OF PROCEDURE:  12/01/2006  DATE OF DISCHARGE:                               OPERATIVE REPORT   PREOPERATIVE DIAGNOSIS:  Spondylosis T10-L2 with stenosis L1-L2 with  lumbar radiculopathy and neurogenic claudication instability L1-L2.   POSTOPERATIVE DIAGNOSIS:  Spondylosis T10-L2 with stenosis L1-L2 with  lumbar radiculopathy and neurogenic claudication instability L1-L2.   PROCEDURE:  Posterior decompression L1-L2, stabilization from T10-L2  with segmental instrumentation,  posterolateral and posterior  arthrodesis T10-L2, interbody arthrodesis L1-L2 with peak spacers with  local autograft and allograft.   SURGEON:  Stefani Dama, M.D.   FIRST ASSISTANT:  Dr. Shirlean Kelly.   ANESTHESIA:  General endotracheal.   INDICATIONS:  Jetaime Pinnix a 65 year old individual who has had severe  spondylitic disease in the lumbar spine has previously undergone  decompression arthrodesis from L3 to the sacrum in stages starting at L4-  5 and L4 to sacrum and then L3-4 and more recently L2-3.  She was having  increasing problems with back pain and bilateral lower extremity pain  with neurogenic claudication and radicular symptoms.  A recent myelogram  demonstrates the presence of degenerative process at the L1-L2 level  with some retrolisthesis tight stenosis at L1-L2.  The patient also had  spondylitic changes at the T11-12 level and also at T12-L1 where there  was spondylitic overgrowth particularly on the right side.  As the  patient's neurologic status had been deteriorating, it was advised that  she undergo surgical decompression arthrodesis.  She is now taken to the  operating room for this procedure.   PROCEDURE:  The patient was  brought to the operating room supine on the  stretcher and after the smooth induction of general endotracheal  anesthesia she was turned prone.  The back was prepped with alcohol and  DuraPrep and draped in sterile fashion.  Previous midline incision was  reopened and dissection was carried down to the lumbodorsal fascia which  was opened on either side of midline.  The hardware that was previously  placed was identified and the soft tissue was stripped from over the  hardware.  The hardware was then loosened and removed.  This was Cytogeneticist.  The arthrodesis at the L2-L3 level was noted be quite  solid with a significant amount of bone growing posteriorly and  posterior laterally.  Superior to this was noted the L1 spinous process  which had markedly overgrown facette joints and then complete  laminectomy of the L1 vertebra was undertaken.  A remnant of the L2  vertebrae that was left from a previous operation was also removed.  Common dural tube was decompressed with a complete facetectomy at the L1-  L2 level being completed.  The lateral recesses were noted be quite  stenotic.  The L1 nerve root was noted be stenotic in its foramen and  decompression  of the soft tissue overlying the foramen relieved this.  The disk was noted be markedly degenerated and by gently retracting the  dura, the disk space could be entered.  The osteotome was used to  relieve significant posterior osteophytes and to further the  decompression posteriorly at the L1-L2 level.  The disk space was then  entered and a series of dilators was used to open up the disk space  further to allow removal of a modest amount of some very desiccated disk  material.  Disk shapers were then used in the 5, 6 and ultimately the 7  mm sized to expand the disk space and decorticate the edges of bone off  either side.  A series of curettes were then used within the disk space  to also remove remnants of cartilaginous  endplate material and to assure  that there was a good decorticated surface at the L1-L2 interspace.  This was done first from the right side and then from the left side and  8 mm spacer was placed in on the left side.  This was noted to have  expanded the disk space adequately and also allowed for good  decompression and alignment peak spacers measuring 8 x 25 mm were then  filled with a combination of autograft that was mixed with strips of  Infuse.  The disk space was then packed with autograft that was obtained  from the laminectomy.  Infuse was also used in the interspace and then  the peak spacers were tamped into the interspace positioned correctly  using fluoroscopic guidance and the rest of the interspace was then  packed with additional bone.  Once this was completed, the T12 lamina  was undercut substantially to relieve modest amount of stenosis that was  forming at this level.  This was done mostly by removing ligamentous  tissue but also by undercutting the facette joint under T12.  Once this  was accomplished, fluoroscopy was then used to select pedicle entry  sites at T10, T11, T12, L1.  The previous L2 pedicle screw holes were  used and here 6.5 x 45 mm pedicle screws were placed into the pedicle  holes.  Sequentially then pedicle probes were passed starting at L1 and  these were sequentially then removed and using fluoroscopic guidance,  6.5 mm tap was used to tap holes.  The L1 vertebra was instrumented was  6.5 x 45 mm pedicle screws at T12  6.5 mm x 50 mm pedicle screws were  used.  However, on the left side there was noted to be medial cutout of  the pedicle screw and a left-sided screw was omitted.  T11 was  instrumented with 6.5 x 50 mm pedicle screws as was T10.  After each  hole was individually tapped, checked radiographically and then cutout  was verified with the bone probe.  Once the hardware was positioned, final AP and lateral radiographs were obtained, then  120 mm straight  rods were placed between the pedicle screw heads and these were locked  in position after adjusting the height of the screw heads themselves.  The construct was locked into a neutral position with the patient lying  prone on the bed.  Prior to doing this, the posterior aspects of the  spinous processes and laminar arches were decorticated from T10 down to  the L2.  At L1 and L2 the transverse processes were decorticated at L1-  L2.  Then bone graft along with strips of Infuse were  laid into these  lateral gutters from L2 to L1.  The transverse process had been  decorticated, T12 vertebrae superiorly and laminar arches were also  packed with bone graft and Infuse strips all the way up to T10.  The  area was inspected then for hemostasis.  Care was taken to make sure  again that the L1-L2 nerve roots were well decompressed.  The common  dural tube was well decompressed and once this was verified, the wound  was closed over a large Hemovac drain with #1 Vicryl being used in the  thoracodorsal lumbodorsal fascia.  2-0 Vicryl used in subcutaneous  tissues, 3-0 Vicryl subcuticularly and surgical staples were placed in  the skin.  The patient tolerated procedure well.  Blood loss estimated  at 1000 mL.  400 mL of Cell Saver blood was returned to the patient  during this procedure.      Stefani Dama, M.D.  Electronically Signed     HJE/MEDQ  D:  12/01/2006  T:  12/01/2006  Job:  [id number]

## 2011-03-05 NOTE — Op Note (Signed)
[place]. [facility]  Patient:    [name], [name]                      MRN: [id number] Proc. Date: 08/02/00 Adm. Date:  [id number] Disc. Date: [id number] Attending:  Jonne Ply                           Operative Report  PREOPERATIVE DIAGNOSIS:  Left carpal tunnel syndrome.  POSTOPERATIVE DIAGNOSIS:  Left carpal tunnel syndrome.  PROCEDURE:  Left carpal tunnel release.  SURGEON:  Stefani Dama, M.D.  ANESTHESIA:  Local plus IV sedation.  INDICATIONS:  [name] is a 65 year old individual who has had a previous right carpal tunnel release for severe carpal tunnel syndrome.  She has had symptoms in the left upper extremity, but these have been fairly well-managed until the past several months, where she has noted progressive worsening of pain in the wrist and hand with numbness and aching, particularly disabling at night.  She did not respond well to the use of a wrist splint.  She has been on nonsteroidal anti-inflammatories and has been on chronic narcotic pain medication for difficulties with spondylitic disease in both the neck and the back.  The patient was advised regarding carpal tunnel release.  DESCRIPTION OF PROCEDURE:  The patient was brought to the operating room, placed on the table in supine position.  An IV had been started by the anesthesia department, and she was given some mild sedation with Versed. After scrubbing the left wrist and draping it out sterilely with a stockinette, the patient received some additional transient sedation as the area was infiltrated in the region of the right wrist from the midportion of the wrist in a line following down to the fourth ray.  A vertical incision was then made in this area after adequate local anesthetic, which was a mixture of 1% lidocaine with epinephrine 1:100,000, and 0.5% Marcaine in a 50/50 mixture, of a total volume of approximately 5 cc being injected.  The vertical  incision was made, and the subcutaneous fascia was identified and the transcarpal ligament was identified, and this was then sectioned.  Underlying this was noted to be a very flattened, blanched median nerve, and the median nerve was then picked up and followed along its ulnar aspect as the ligament was further sectioned back toward the wrist and underneath the wrist crease itself.  The distal portion of the nerve was similarly freed by sectioning the transcarpal ligament above it, and in the end the entire path of the median nerve was disclosed in this fashion.  The area was then checked for hemostasis. Irrigation with antibiotic irrigation solution was performed, and then 3-0 Vicryl was used in the subcuticular tissues to close the skin, and the skin was closed topically with Dermabond.  A pressure dressing was then applied along with an Ace wrap across the wrist.  The patient tolerated the procedure well, was returned to recovery in stable condition. DD:  08/02/00 TD:  08/02/00 Job: [id number]

## 2011-03-05 NOTE — H&P (Signed)
NAMECHRISTIAN, [name]               ACCOUNT NO.:  [id number]   MEDICAL RECORD NO.:  [id number]          PATIENT TYPE:  INP   LOCATION:  NA                           FACILITY:  [facility]   PHYSICIAN:  Madlyn Frankel. Charlann Boxer, M.D.  DATE OF BIRTH:  10/15/1946   DATE OF ADMISSION:  DATE OF DISCHARGE:                                HISTORY & PHYSICAL   CHIEF COMPLAINT:  Right knee pain status post right total knee arthroplasty.   HISTORY OF PRESENT ILLNESS:  [name] is a very pleasant 65 year old  female who has a history of right total knee replacement with a nonhealing  wound.  The knee was replaced approximately 2 years ago and since then has  experienced a nonhealing wound and has continued to have knee instability  and a giving out sensation.  After speaking with Dr. Charlann Boxer about the measures  that would be taken, it was determined that a polyethylene exchange with a  possible gastroc skin flap might be needed.   PAST MEDICAL HISTORY:  1. Hypertension.  2. Hypothyroidism.  3. Anemia.  4. Osteoarthritis.   PAST SURGICAL HISTORIES:  1. Eleven back surgeries.  2. Cholecystectomy.  3. Tonsillectomy.  4. Tubal ligation.  5. Partial hysterectomy.  6. Total knee replacement in 2005, 2006.  7. Carpal tunnel release both hands.   SOCIAL HISTORY:  Patient is married to husband Mardelle Matte.  She is a nonsmoker.   FAMILY HISTORY:  Significant for:  1. Heart disease.  2. Hypertension.  3. Diabetes.  4. Cancer.  5. Stroke.  6. Arthritis.   DRUG ALLERGIES:  WERE STATED IN THE PAST TO BE MORPHINE AND DEMEROL;  HOWEVER, SHE STATES IN HER LAST SURGERY THAT SHE HAD NO REACTION TO EITHER  ONE.   MEDICATIONS:  Include the following:  1. TriCor 10 mg.  2. Atenolol 25 mg.  3. Synthroid 75 mcg.  4. Lasix 60 mg.  5. Hydrocodone 7.5/750 which she takes 2-3 times per day.  6. Neurontin 600 mg 3 times daily.  7. Indomethacin 50 mg twice daily.  8. Cyclobenzaprine 10 mg x3 daily.  9. Alprazolam 0.5 mg  twice per day, which she takes on a regular basis.  10.Ambien 10 mg nightly which she has been taking for an extended period      of time and is unable to sleep without it.   REVIEW OF SYSTEMS:  She said in the past 2 weeks she has had no new symptoms  of chest pain, shortness of breath, abdominal pain, diarrhea.  She has had  some ongoing constipation due to narcotic use.  No new genitourinary  symptoms.  No new musculoskeletal pains other than those in the History of  Present Illness.  No new headaches, no new syncope.  She recently  discovered, through her primary care physician, Dr. Timothy Lasso, that she has had  some anemia, which has been addressed, and she has been receiving EPO shots  once per week and taking iron.   PHYSICAL EXAM:  Temperature was 98.2, pulse 68, respirations 18, blood  pressure 162/104 right arm sitting.  GENERAL:  Patient was awake, alert, oriented, in no acute distress and was  well-developed, well-nourished.  NECK:  Supple, no lymphadenopathy, no carotid bruits bilaterally.  CHEST:  Lungs were clear to auscultation bilaterally.  BREASTS:  Deferred.  HEART:  Regular rate and rhythm.  No gallops, clicks, rubs or murmurs.  ABDOMEN:  Soft, nontender, nondistended.  Bowel sounds present in all four  quadrants.  GENITOURINARY:  Deferred.  EXTREMITIES:  Right knee is diffusely tender with a midline scar.  She has a  0-90 degree range of motion, no signs of cellulitis.  SKIN:  There is a midline incision well healed.  There is a scar that is  intact.  NEUROLOGIC:  She is intact.  She has intact sensibilities distally.   LABS:  Labs performed by her family care physician are pending and being  faxed over.  Does show that her hemoglobin of last count was 10.7.  Will  continue the EPO shots and iron up until time of surgery.   X-RAYS:  She has had right knee AP & lateral performed.   IMPRESSION:  Right knee implant and [employer] knee.  The cement  mantles  appear to be intact.  Previous bone scans have been done and were  negative for any obvious loosening.   PLAN OF ACTION:  A revision polyethylene exchange of the right knee with the  possibility of a muscle transfer over the anterior aspect of the knee and to  prevent any skin complications again.  The surgery will be performed by Dr.  Durene Romans.  All risks and complications were discussed with the patient  and encouraged and answered all questions to patient's satisfaction.     ______________________________  Yetta Glassman [address], [place]      Madlyn Frankel. Charlann Boxer, M.D.  Electronically Signed    BLM/MEDQ  D:  08/03/2006  T:  08/04/2006  Job:  [id number]

## 2011-03-05 NOTE — Consult Note (Signed)
[name], [name]               ACCOUNT NO.:  [id number]   MEDICAL RECORD NO.:  [id number]          PATIENT TYPE:  INP   LOCATION:  3005                         FACILITY:  [facility]   PHYSICIAN:  Melissa L. Ladona Ridgel, MD  DATE OF BIRTH:  Oct 09, 1946   DATE OF CONSULTATION:  12/20/2006  DATE OF DISCHARGE:  12/21/2006                                 CONSULTATION   REASON FOR CONSULTATION:  To assist with symptom management related to  pain.   CONSULT PERFORMED:  By Lonia Chimera, NP with Derenda Mis, MD   This nurse practitioner reviewed the medical records, received report  from team, assessed the patient, then spoke with the patient and her  husband, at length, regarding goals of care and symptom management.   RECOMMENDATIONS ARE AS FOLLOWS:  1. Discontinue Dilaudid.  2. MS Contin 15 mg b.i.d.  3. MSIR 15 mg 1 tablet q.4 h. p.r.n.  4. Tylenol 650 mg p.o. b.i.d.  5. Increase Neurontin to full dose.  6. Followup with doctor's office for chronic pain management      [facility].  I will call and schedule an appointment.  7. Get pain to a 2/5 prior to discharge.   IMPRESSION/RECOMMENDATIONS:  Acute postsurgical neuropathic pain on  chronic back pain and knee pain secondary to severe degenerative disk  disease, and 13 surgeries to the spine as well as a knee replacement  requiring chronic use of Vicodin.  The patient is tolerant to narcotics;  and has been on a morphine PCA prior to switching over to p.o.Marland Kitchen  The  morphine PCA worked well for her; and she was able to participate in  rehab; and, she stated, that she was able to keep her head clear and get  her pain down to a 6/10.  Since she has had no adverse reaction to the  morphine, I will stick with that specific narcotic, and add a long-  acting 15 mg p.o. b.i.d. as well as MSIR 15 mg 1 tablet q.4 h. p.r.n.Marland Kitchen  I will follow up in the a.m., and set an appointment up with the head  pain management center to follow up as needed.   The goal was to get the  patient's pain to a 5/10 so that she can go home.  Please call Lonia Chimera, NP at [phone] with any questions or concerns.   HISTORY OF PRESENT ILLNESS:  Mrs. [name] is a 65 year old  Caucasian female with multiple orthopedic surgeries.  Most recently on  February 14 and 19, she was suffering from severe right flank pain; and  has experienced chronic constipation most her life.  She became more  constipated after recent surgery.  She has been seeing Dr. Danielle Dess for  several years; however, over the last couple of months, her pain has  increased to the point where she needed surgical intervention.  Here in  the hospital she had an L2 radiculopathy status post decompression T10-  L2, secondary to epidural hematoma at the left L2.  She has a history of  multiple spinal surgeries including 11 surgeries with Dr. Danielle Dess.  She  has had a total right knee replacement in the last year.  She has had an  EGD done by Dr. Randa Evens in 2003 that showed a hiatal hernia and reflux  disease; and she was found to have sliding hernia on this admission.  She also has had a history of hypertension, hypothyroidism, anxiety,  osteoarthritis, status post cholecystectomy and hysterectomy.   MEDICATIONS INCLUDE:  1. Xanax 0.5 mg p.o. t.i.d.  2. Tenormin 50 mg p.o. daily.  3. Flexeril 10 mg p.o. to q.8 h.  4. Colace mg p.o. b.i.d.  5. Cymbalta 60 mg p.o. daily.  6. Neurontin 100 mg p.o. t.i.d.  7. Synthroid 75 mcg p.o. daily.  8. Lidoderm one patch topically q.24 h.  9. Protonix 40 mg p.o. b.i.d.  10.MiraLax 17 grams p.o. daily.  11.Prednisone 10 mg p.o. b.i.d.  12.Senokot 1 tablet p.o. b.i.d.  13.Dulcolax 5 mg p.o. p.r.n.   ALLERGIES:  DEMEROL.   PAST MEDICAL HISTORY:  As above in HPI.   SOCIAL HISTORY:  The patient lives at home with her husband.  She is a  Curator.  She enjoys spending time with her grandchildren.  She  retired in 1993.   REVIEW OF SYSTEMS:  The  patient reports that her pain starts in her  lower back and radiates around to her stomach.  She states that her  normal pain at home was typically a 6-7/10 that would relieve down to  5/10 Vicodin.  Prior to admission her pain was a 9/10.  She has been  followed by Dr. Danielle Dess for this.  She is also currently on Neurontin,  Flexeril, and Xanax, chronically.  She also has taking Tylenol on top of  her Vicodin which is 7.5/750 mg for the last couple of years; and she is  concerned about her liver as she has had elevated liver enzymes.  She  also reports that the pain takes her breath away; she is unable to  participate in ADLs when the pain is above an 8/10.  She describes the  pain as a burning sensation that is deep and sharp and nonrelenting.  She states that the pain is worse when she moves or bends down.  She  states that nothing relieves the pain except for the Dilaudid that they  give her; however, it only relieves it down to about a 7/10 and she is  worried that she will be unable to care for herself if she goes home.  She denies any nausea at the present time.  She does report a bowel  movement yesterday.  She denies any headache.  When she does the  morphine, she stated that he IV morphine was very helpful; and she was  able to participate in PT and work on her rehabilitation without any  adverse effects.   PHYSICAL EXAM:  VITAL SIGNS:  BP 108/60, heart rate 74, respirations 18,  O2 saturation 98% on room air.  GENERAL:  The patient is alert and oriented.  She appears in mild  distress secondary to her pain; and is unable to get comfortable in the  bed.  LUNGS:  Clear bilaterally.  HEART:  Rate regular.  No murmurs, rubs, or gallops.  ABDOMEN:  Soft, mildly tender in the right upper quadrant; however, pain  is mostly exhibited with the patient turns or attempts to sit up. NEUROLOGIC:  The patient follows all commands.  She has 5/5 muscle  strength in all four extremities.    LABS INCLUDE:  A white blood cell count 12.3, hemoglobin 12.6,  hematocrit 36.3, platelets 554.  Sodium 133, potassium 3.9, chloride 94,  BUN 9, creatinine 0.73, SGOT 20, SGPT 23, albumin 3.1.      Asencion Noble, NP      Melissa L. Ladona Ridgel, MD  Electronically Signed    KMJ/MEDQ  D:  12/21/2006  T:  12/21/2006  Job:  [id number]   cc:   Stefani Dama, M.D.  [facility]

## 2011-03-05 NOTE — Op Note (Signed)
NAMETMYA, WIGINGTON               ACCOUNT NO.:  [id number]   MEDICAL RECORD NO.:  [id number]          PATIENT TYPE:  INP   LOCATION:  3104                         FACILITY:  [facility]   PHYSICIAN:  Stefani Dama, M.D.  DATE OF BIRTH:  [birth year]   DATE OF PROCEDURE:  12/06/2006  DATE OF DISCHARGE:                               OPERATIVE REPORT   PREOPERATIVE DIAGNOSIS:  L2 radiculopathy, status post decompression  arthrodesis T10-L2, secondary to epidural hematoma left L2.   POSTOPERATIVE DIAGNOSIS:  L2 radiculopathy, status post decompression  arthrodesis T10-L2, secondary to epidural hematoma left L2.   PROCEDURE:  Is exploration of lumbar surgical site, decompression of  left L2 nerve root.   SURGEON:  Dr. Danielle Dess.   ANESTHESIA:  General endotracheal.   INDICATIONS:  [name] is 65 year old individual who had an  extensive decompression arthrodesis from T10-L2.  Postoperatively, she  awoke with a severe left-sided radiculopathy seemingly in the anterior  thigh involving the L2 nerve root.  She complained bitterly of the pain  for 4 days, and despite medication, this pain has persisted.  Myelogram  was performed yesterday which demonstrated the presence of an extradural  mass causing some compression on the left side of the thecal sac in the  region of the L2 nerve root.  Because of this, she is taken to the  operating room now to undergo surgical decompression of what is  suspected to be epidural hematoma.   PROCEDURE:  The patient was brought to the operating room supine on the  stretcher.  After smooth induction of general endotracheal anesthesia,  she was turned prone.  The back was cleansed with rubbing alcohol.  Surgical staples were removed, and then after cleansing the back again,  it was prepped with DuraPrep.  The back was then dressed out sterilely,  and the incision was opened using a Metzenbaum scissor to release the  subcuticular stitches in the skin.  This  was over the L2-L3 region.  In  this region, the subcutaneous tissue was then divided releasing sutures,  and then the lumbodorsal fascia was divided.  The wound was then  explored, finding a moderate amount of epidural liquid blood clot.  In  the surgical site, some form of blood clot was identified on the left  side of the thecal sac and the nerve root.  This was easily evacuated  away, and then with further irrigation, no other blood clot was  encountered.  Some of the tissue in this area was very edematous and  fragile, and this was broken up and removed, also to allow decompression  of the left side of the thecal sac in the region above and below the L2  nerve root.  A series of dilators and probes were then passed around the  nerve root both medially, inferiorly, superiorly and posteriorly.  No  other fragments of disk were or other unusual material was identified.  This area being well decompressed and secured, hemostasis was obtained  very carefully using several pledgets of Gelfoam soaked in thrombin  which were later removed.  Some epidural veins  in this region were  cauterized and divided.  In the end, the common dural tube, the L2 nerve  root, the L1 nerve root above this were well decompressed.  No pressure  existed.  With hemostasis being achieved, retractor was removed, and the  lumbodorsal fascia was closed with #1 Vicryl in an interrupted fashion.  Two-0 Vicryl was used in the subcutaneous tissues, 3-0 Vicryl  subcuticularly.  The patient tolerated the procedure well and was  returned to recovery room in stable condition.      Stefani Dama, M.D.  Electronically Signed     HJE/MEDQ  D:  12/06/2006  T:  12/07/2006  Job:  [id number]

## 2011-03-21 NOTE — Discharge Summary (Signed)
[name], [name]               ACCOUNT NO.:  [id number]  MEDICAL RECORD NO.:  [id number]           PATIENT TYPE:  I  LOCATION:  1605                         [facility]ACILITY:  [facility]  PHYSICIAN:  Madlyn Frankel. Charlann Boxer, M.D.  DATE OF BIRTH:  [birth year]  DATE OF ADMISSION:  02/16/2011 DATE OF DISCHARGE:  02/18/2011                              DISCHARGE SUMMARY   ADMITTING DIAGNOSIS:  Left total hip instability with previous revision surgery.  DISCHARGE DIAGNOSES: 1. Left hip instability status post revision of the acetabulum to a     constrained liner performed on Feb 16, 2011. 2. History of deep vein thrombosis, on Coumadin, as well as atrial     fibrillation. 3. History of chronic pain. 4. History of hypothyroidism. 5. Hypercholesterolemia. 6. Reflux disease.  She has also had multiple surgical procedures.  ADMITTING HISTORY:  [name] is very pleasant 66 year old patient of mine with history of multiple surgical procedures.  She had a left total hip replacement that was revised based on ASR component.  In the postoperative period despite what I felt to have a very stable hip including some lengthening of left lower extremity, she had 2 bouts of recurrent instability.  She was very reluctant on this, ever happen again, was very anxious about it.  Component position appeared to be stable radiographically.  Nonetheless, she wished to proceed with revision surgery.  I felt based on the amount of surgical procedures she has had, the position of the components radiographically, and the duration out from her last procedure, she will be a candidate for a constrained liner.  I felt this would give [place] the easiest ability to provide stability with little consequence down the road base on the component position.  We also discussed trying to lengthen a little bit more on the left side based on her previous right knee surgery.  She had had lengthening of the right lower extremity, not the  left.  Consent was obtained.  HOSPITAL COURSE:  The patient admitted for same day surgery on Feb 16, 2011.  Please see dictated operative note for left total hip procedure for full details.  After routine stay in the recovery room, she was transferred to the orthopedic ward where she remained for her 2-day hospital stay.  She was seen and evaluated by Physical Therapy.  She was weightbearing as tolerated.  She was noted on postoperative day #1 to have a stable hematocrit.  On postop day #2, it was 32.2.  Her electrolytes remained stable.  Her wound was dry.  On postop day #1, her Hemovac drain was removed and her Foley catheter was removed.  She had made arrangements for home health physical therapy.  DISCHARGE INSTRUCTIONS:  At time of discharge, she was stable.  She will be seen and evaluated by Home Health Physical Therapy.  Any orthopedic questions, she knows to contact our office.  She will return to see Dr. Durene Romans at [facility], [phone], in 2 weeks' time.  DISCHARGE MEDICATIONS:  Include: 1. Colace 100 mg p.o. b.i.d. 2. Lovenox 40 mg subcu daily for 5 days while she ramps back  up on her     Coumadin.  Her Coumadin will be taken as home dosage daily. 3. Vicodin/Norco 7.5/325 one to two tablets every 4 to 6 hours as     needed for pain. 4. MiraLax 17 g p.o. daily as needed for constipation. 5. Xanax 0.5 mg p.o. b.i.d. as needed. 6. Atenolol 25 mg p.o. q.a.m. 7. Calcium plus vitamin D. 8. Cinnamon. 9. Flector patches as needed. 10.Flexeril 10 mg p.o. t.i.d. as needed. 11.Lasix 20 mg 2 tablets q.a.m. 12.Neurontin 600 mg t.i.d. as needed. 13.Lidoderm patches as needed. 14.Multivitamin daily. 15.Omeprazole 20 mg q.a.m. 16.Synthroid 75 mcg p.o. q.a.m. 17.TriCor 145 mg q.a.m. 18.Vitamin B12. 19.Zolpidem 10 mg at night as needed.     Madlyn Frankel Charlann Boxer, M.D.     MDO/MEDQ  D:  03/16/2011  T:  03/16/2011  Job:  [id number]  Electronically Signed by  Durene Romans M.D. on 03/21/2011 04:21:42 PM

## 2011-05-25 ENCOUNTER — Encounter (INDEPENDENT_AMBULATORY_CARE_PROVIDER_SITE_OTHER): Payer: Medicare Other

## 2011-05-25 DIAGNOSIS — R609 Edema, unspecified: Secondary | ICD-10-CM

## 2011-05-25 DIAGNOSIS — M79609 Pain in unspecified limb: Secondary | ICD-10-CM

## 2011-06-01 NOTE — Procedures (Unsigned)
DUPLEX DEEP VENOUS EXAM - LOWER EXTREMITY  INDICATION:  Left lower extremity edema.  HISTORY:  Edema:  Foot edema, left. Trauma/Surgery:  No. Pain:  No. PE:  No. Previous DVT:  Following back surgery in 2008. Anticoagulants:  Currently taking Coumadin. Other:  The patient has Greenfield filter.  DUPLEX EXAM:               CFV   SFV   PopV  PTV    GSV               R  L  R  L  R  L  R   L  R  L Thrombosis    o  o     o     o      o     o Spontaneous   +  +     +     +      +     + Phasic        +  +     +     +      +     + Augmentation  +  +     +     +      +     + Compressible  +  +     +     +      +     + Competent     +  +     +     o      +     +  Legend:  + - yes  o - no  p - partial  D - decreased  IMPRESSION: 1. No evidence of left lower extremity deep venous thrombosis was     noted. 2. Preliminary results were called to Amy at Dr. Ferd Hibbs office.   _____________________________ Janetta Hora. Fields, MD  EM/MEDQ  D:  05/25/2011  T:  05/25/2011  Job:  [id number]

## 2011-06-08 ENCOUNTER — Other Ambulatory Visit (HOSPITAL_COMMUNITY): Payer: Self-pay | Admitting: Neurological Surgery

## 2011-06-08 DIAGNOSIS — M47812 Spondylosis without myelopathy or radiculopathy, cervical region: Secondary | ICD-10-CM

## 2011-06-30 ENCOUNTER — Ambulatory Visit (HOSPITAL_COMMUNITY)
Admission: RE | Admit: 2011-06-30 | Discharge: 2011-06-30 | Disposition: A | Discharge status: Home / Self Care | Payer: Medicare Other | Source: Ambulatory Visit | Attending: Neurological Surgery | Admitting: Neurological Surgery

## 2011-06-30 DIAGNOSIS — M47812 Spondylosis without myelopathy or radiculopathy, cervical region: Secondary | ICD-10-CM

## 2011-06-30 MED ORDER — IOHEXOL 300 MG/ML  SOLN
10.0000 mL | Freq: Once | INTRAMUSCULAR | Status: AC | PRN
  Administered 2011-06-30: 10 mL via INTRATHECAL

## 2011-07-12 LAB — DIFFERENTIAL
Basophils Absolute: 0
Basophils Relative: 0
Eosinophils Absolute: 0.1
Neutro Abs: 5.2
Neutrophils Relative %: 74

## 2011-07-12 LAB — POCT I-STAT, CHEM 8
BUN: 26 — ABNORMAL HIGH
Calcium, Ion: 1.16
Chloride: 101
Creatinine, Ser: 1.4 — ABNORMAL HIGH
Glucose, Bld: 123 — ABNORMAL HIGH
HCT: 35 — ABNORMAL LOW
Hemoglobin: 11.9 — ABNORMAL LOW
Potassium: 4.6
Sodium: 134 — ABNORMAL LOW
TCO2: 28

## 2011-07-12 LAB — POCT CARDIAC MARKERS
CKMB, poc: <1 — ABNORMAL LOW
Myoglobin, poc: 43
Operator id: 277751
Troponin i, poc: <0.05

## 2011-07-12 LAB — APTT: aPTT: 35

## 2011-07-12 LAB — CBC
MCHC: 33.8
MCV: 85
Platelets: 338
RDW: 13.6

## 2011-07-12 LAB — PROTIME-INR
INR: 1.8 — ABNORMAL HIGH
Prothrombin Time: 21.4 — ABNORMAL HIGH

## 2011-07-13 LAB — CBC
HCT: 34.4 — ABNORMAL LOW
HCT: 32.3 — ABNORMAL LOW
HCT: 25.1 — ABNORMAL LOW
HCT: 27.2 — ABNORMAL LOW
Hemoglobin: 11.6 — ABNORMAL LOW
Hemoglobin: 11.3 — ABNORMAL LOW
Hemoglobin: 8.6 — ABNORMAL LOW
Hemoglobin: 9.4 — ABNORMAL LOW
MCHC: 34.1
MCV: 85.5
MCV: 83.6
MCV: 85.7
MCV: 86.9
Platelets: 208
Platelets: 231
Platelets: 302
RBC: 3.87
RBC: 2.93 — ABNORMAL LOW
RDW: 13.8
RDW: 14.4
WBC: 5
WBC: 10.4
WBC: 6.7
WBC: 7.4

## 2011-07-13 LAB — COMPREHENSIVE METABOLIC PANEL
ALT: 19
AST: 20
Albumin: 2.5 — ABNORMAL LOW
Albumin: 2.6 — ABNORMAL LOW
Alkaline Phosphatase: 49
BUN: 20
BUN: 32 — ABNORMAL HIGH
Calcium: 9.9
Chloride: 100
Chloride: 98
Creatinine, Ser: 1.26 — ABNORMAL HIGH
Creatinine, Ser: 1.53 — ABNORMAL HIGH
Creatinine, Ser: 1.6 — ABNORMAL HIGH
GFR calc Af Amer: 42 — ABNORMAL LOW
GFR calc non Af Amer: 33 — ABNORMAL LOW
Glucose, Bld: 95
Glucose, Bld: 116 — ABNORMAL HIGH
Potassium: 4.8
Sodium: 134 — ABNORMAL LOW
Total Bilirubin: 0.6
Total Bilirubin: 0.7
Total Protein: 6.9

## 2011-07-13 LAB — BASIC METABOLIC PANEL
CO2: 23
Chloride: 95 — ABNORMAL LOW
Chloride: 100
GFR calc Af Amer: 44 — ABNORMAL LOW
GFR calc Af Amer: 41 — ABNORMAL LOW
GFR calc non Af Amer: 36 — ABNORMAL LOW
Potassium: 5.3 — ABNORMAL HIGH
Potassium: 3.8
Sodium: 137
Sodium: 132 — ABNORMAL LOW

## 2011-07-13 LAB — URINALYSIS, ROUTINE W REFLEX MICROSCOPIC
Bilirubin Urine: NEGATIVE
Hgb urine dipstick: NEGATIVE
Nitrite: NEGATIVE
Protein, ur: NEGATIVE
Specific Gravity, Urine: 1.008
Urobilinogen, UA: 0.2

## 2011-07-13 LAB — DIFFERENTIAL
Eosinophils Absolute: 0.1
Eosinophils Relative: 2
Lymphocytes Relative: 22
Lymphs Abs: 1.1
Monocytes Relative: 10

## 2011-07-13 LAB — TYPE AND SCREEN
ABO/RH(D): A POS
Antibody Screen: NEGATIVE

## 2011-07-13 LAB — PROTIME-INR
INR: 1
INR: 1.3
Prothrombin Time: 16.7 — ABNORMAL HIGH

## 2011-07-13 LAB — CK TOTAL AND CKMB (NOT AT ARMC)
Relative Index: INVALID
Total CK: 75

## 2011-07-13 LAB — TROPONIN I: Troponin I: 0.01

## 2011-07-13 LAB — URINE MICROSCOPIC-ADD ON

## 2011-07-13 LAB — ABO/RH: ABO/RH(D): A POS

## 2011-07-13 LAB — APTT: aPTT: 28

## 2011-07-15 LAB — BASIC METABOLIC PANEL
BUN: 21
BUN: 29 — ABNORMAL HIGH
CO2: 31
Calcium: 9.9
Chloride: 103
Chloride: 97
Chloride: 99
Creatinine, Ser: 1.49 — ABNORMAL HIGH
GFR calc Af Amer: 43 — ABNORMAL LOW
GFR calc Af Amer: 54 — ABNORMAL LOW
GFR calc non Af Amer: 36 — ABNORMAL LOW
Glucose, Bld: 120 — ABNORMAL HIGH
Potassium: 4.6
Potassium: 4.7
Sodium: 135
Sodium: 138

## 2011-07-15 LAB — PROTIME-INR
INR: 1.1
INR: 1.7 — ABNORMAL HIGH
INR: 2.5 — ABNORMAL HIGH
INR: 2.7 — ABNORMAL HIGH
Prothrombin Time: 20.7 — ABNORMAL HIGH
Prothrombin Time: 30 — ABNORMAL HIGH

## 2011-07-15 LAB — CBC
HCT: 28.5 — ABNORMAL LOW
Hemoglobin: 10.2 — ABNORMAL LOW
Hemoglobin: 9.6 — ABNORMAL LOW
MCHC: 33.7
MCV: 87
MCV: 87.3
Platelets: 300
Platelets: 369
RBC: 3.47 — ABNORMAL LOW
RBC: 3.92
WBC: 5.4
WBC: 7.7
WBC: 8.1

## 2011-07-20 LAB — CBC
Hemoglobin: 9.5 — ABNORMAL LOW
MCHC: 33.5
RBC: 3.25 — ABNORMAL LOW
WBC: 7

## 2011-07-20 LAB — BASIC METABOLIC PANEL
CO2: 28
Calcium: 10.3
Creatinine, Ser: 1.34 — ABNORMAL HIGH
GFR calc Af Amer: 49 — ABNORMAL LOW
GFR calc non Af Amer: 40 — ABNORMAL LOW
Sodium: 138

## 2011-07-20 LAB — PROTIME-INR
INR: 1
Prothrombin Time: 12.9

## 2011-08-02 LAB — CBC
HCT: 29.6 — ABNORMAL LOW
Hemoglobin: 10.2 — ABNORMAL LOW
MCV: 84.5
Platelets: 396
RBC: 3.51 — ABNORMAL LOW
WBC: 7.6

## 2011-08-02 LAB — APTT: aPTT: 24

## 2011-08-02 LAB — BASIC METABOLIC PANEL
BUN: 46 — ABNORMAL HIGH
Chloride: 98
GFR calc Af Amer: 40 — ABNORMAL LOW
GFR calc non Af Amer: 33 — ABNORMAL LOW
Potassium: 4.9
Sodium: 135

## 2011-08-02 LAB — PROTIME-INR
INR: 1
Prothrombin Time: 13.7

## 2011-08-10 ENCOUNTER — Other Ambulatory Visit: Payer: Self-pay | Admitting: Internal Medicine

## 2011-08-10 DIAGNOSIS — E042 Nontoxic multinodular goiter: Secondary | ICD-10-CM

## 2011-08-31 ENCOUNTER — Ambulatory Visit
Admission: RE | Admit: 2011-08-31 | Discharge: 2011-08-31 | Disposition: A | Discharge status: Home / Self Care | Payer: Medicare Other | Source: Ambulatory Visit | Attending: Internal Medicine | Admitting: Internal Medicine

## 2011-08-31 ENCOUNTER — Other Ambulatory Visit (HOSPITAL_COMMUNITY)
Admission: RE | Admit: 2011-08-31 | Discharge: 2011-08-31 | Disposition: A | Discharge status: Home / Self Care | Payer: Medicare Other | Source: Ambulatory Visit | Attending: Interventional Radiology | Admitting: Interventional Radiology

## 2011-08-31 ENCOUNTER — Ambulatory Visit
Admission: RE | Admit: 2011-08-31 | Discharge: 2011-08-31 | Disposition: A | Discharge status: Home / Self Care | Payer: No Typology Code available for payment source | Source: Ambulatory Visit | Attending: Internal Medicine | Admitting: Internal Medicine

## 2011-08-31 DIAGNOSIS — E042 Nontoxic multinodular goiter: Secondary | ICD-10-CM

## 2011-08-31 DIAGNOSIS — E049 Nontoxic goiter, unspecified: Secondary | ICD-10-CM | POA: Insufficient documentation

## 2012-10-02 ENCOUNTER — Other Ambulatory Visit (HOSPITAL_COMMUNITY): Payer: Self-pay | Admitting: Neurological Surgery

## 2012-11-24 ENCOUNTER — Other Ambulatory Visit (HOSPITAL_COMMUNITY): Payer: Self-pay | Admitting: Neurological Surgery

## 2013-04-13 ENCOUNTER — Other Ambulatory Visit (HOSPITAL_COMMUNITY): Payer: Self-pay | Admitting: Obstetrics and Gynecology

## 2013-04-13 DIAGNOSIS — R011 Cardiac murmur, unspecified: Secondary | ICD-10-CM

## 2013-04-17 ENCOUNTER — Ambulatory Visit (HOSPITAL_COMMUNITY)
Admission: RE | Admit: 2013-04-17 | Discharge: 2013-04-17 | Disposition: A | Discharge status: Home / Self Care | Payer: Medicare Other | Source: Ambulatory Visit | Attending: Obstetrics and Gynecology | Admitting: Obstetrics and Gynecology

## 2013-04-17 DIAGNOSIS — R011 Cardiac murmur, unspecified: Secondary | ICD-10-CM | POA: Insufficient documentation

## 2013-04-17 DIAGNOSIS — E785 Hyperlipidemia, unspecified: Secondary | ICD-10-CM | POA: Insufficient documentation

## 2013-04-17 DIAGNOSIS — I517 Cardiomegaly: Secondary | ICD-10-CM

## 2013-04-17 DIAGNOSIS — I10 Essential (primary) hypertension: Secondary | ICD-10-CM | POA: Insufficient documentation

## 2013-04-17 NOTE — Progress Notes (Signed)
[facility]  2D Echocardiogram completed.  Devlynn Knoff L Toiya Morrish, RDCS 04/17/2013 11:52 AM

## 2013-04-26 ENCOUNTER — Other Ambulatory Visit: Payer: Self-pay | Admitting: Orthopedic Surgery

## 2013-04-26 DIAGNOSIS — M169 Osteoarthritis of hip, unspecified: Secondary | ICD-10-CM

## 2013-05-02 ENCOUNTER — Ambulatory Visit
Admission: RE | Admit: 2013-05-02 | Discharge: 2013-05-02 | Disposition: A | Discharge status: Home / Self Care | Payer: Medicare Other | Source: Ambulatory Visit | Attending: Orthopedic Surgery | Admitting: Orthopedic Surgery

## 2013-05-02 DIAGNOSIS — M169 Osteoarthritis of hip, unspecified: Secondary | ICD-10-CM

## 2013-05-02 MED ORDER — IOHEXOL 180 MG/ML  SOLN
1.0000 mL | Freq: Once | INTRAMUSCULAR | Status: AC | PRN
  Administered 2013-05-02: 1 mL via INTRA_ARTICULAR

## 2013-05-02 MED ORDER — METHYLPREDNISOLONE ACETATE 40 MG/ML INJ SUSP (RADIOLOG
120.0000 mg | Freq: Once | INTRAMUSCULAR | Status: AC
  Administered 2013-05-02: 120 mg via INTRA_ARTICULAR

## 2013-05-22 ENCOUNTER — Other Ambulatory Visit: Payer: Self-pay | Admitting: Interventional Cardiology

## 2013-05-22 DIAGNOSIS — I868 Varicose veins of other specified sites: Secondary | ICD-10-CM

## 2013-05-23 ENCOUNTER — Ambulatory Visit
Admission: RE | Admit: 2013-05-23 | Discharge: 2013-05-23 | Disposition: A | Discharge status: Home / Self Care | Payer: Medicare Other | Source: Ambulatory Visit | Attending: Interventional Cardiology | Admitting: Interventional Cardiology

## 2013-05-23 DIAGNOSIS — I868 Varicose veins of other specified sites: Secondary | ICD-10-CM

## 2013-07-02 ENCOUNTER — Encounter: Payer: Self-pay | Admitting: Vascular Surgery

## 2013-07-04 ENCOUNTER — Encounter: Payer: Self-pay | Admitting: Vascular Surgery

## 2013-07-23 ENCOUNTER — Encounter: Payer: Self-pay | Admitting: Vascular Surgery

## 2013-07-24 ENCOUNTER — Encounter: Payer: Self-pay | Admitting: Vascular Surgery

## 2013-07-24 ENCOUNTER — Ambulatory Visit (INDEPENDENT_AMBULATORY_CARE_PROVIDER_SITE_OTHER): Payer: Medicare Other | Admitting: Vascular Surgery

## 2013-07-24 ENCOUNTER — Other Ambulatory Visit: Payer: Medicare Other

## 2013-07-24 ENCOUNTER — Other Ambulatory Visit: Payer: Self-pay | Admitting: Vascular Surgery

## 2013-07-24 VITALS — BP 150/86 | HR 69 | Wt 259.0 lb

## 2013-07-24 DIAGNOSIS — I729 Aneurysm of unspecified site: Secondary | ICD-10-CM

## 2013-07-24 LAB — CREATININE, SERUM: Creat: 0.82 mg/dL (ref 0.50–1.10)

## 2013-07-24 LAB — BUN: BUN: 10 mg/dL (ref 6–23)

## 2013-07-24 NOTE — Progress Notes (Signed)
[facility] [place]   Patient name: [name] MRN: [id number] DOB: 02/04/1946 Sex: female   Referred by: Elsner  Reason for referral:  Chief Complaint  Patient presents with  . New Evaluation    questionable "big' aneurysmal dilation R side of neck - Dr. Elsner    HISTORY OF PRESENT ILLNESS: Patient is a very pleasant 67-year-old female seen today for evaluation of venous aneurysm in the base of her right neck. She reports this is been present for many years but over the past 6 months has become markedly more distended and also causes her pain. She notes this when she Valsalva's her lean forward. The she reports that this is causing her discomfort at night Jackson has sleep in a position such is not putting pressure on this. She reports that she can awaken with the area being rather firm and she needs to eye manually massaged this to decompress the area. She has no evidence for history of DVT. He has no history of trauma. She has had multiple prior surgeries and feels that she has had eye a central line in this area in years past.  Past Medical History  Diagnosis Date  . Anemia   . DVT (deep venous thrombosis)     Past Surgical History  Procedure Laterality Date  . Filter    . Joint replacement Right 11/2003    (knee)multiple surgeries  . Joint replacement Left 12/2009    (knee)  . Joint replacement Left 2011, 2012    Hip  . Hip surgery Left 2008  . Total shoulder replacement  11/30/2010  . Back surgery      X14 1987-2009  . Carpel tunner    . Carpal    . Cholecystectomy    . Tonsillectomy    . Tubal ligation    . Abdominal hysterectomy    . Mass excision      uterus    History   Social History  . Marital Status: Married    Spouse Name: N/A    Number of Children: N/A  . Years of Education: N/A   Occupational History  . Not on file.   Social History Main Topics  . Smoking status: Never Smoker   . Smokeless tobacco: Never Used  .  Alcohol Use: No  . Drug Use: No  . Sexual Activity: Not on file   Other Topics Concern  . Not on file   Social History Narrative  . No narrative on file    Family History  Problem Relation Age of Onset  . Diabetes Mother   . Heart disease Mother     before age 60  . Hyperlipidemia Mother   . Hypertension Mother   . Diabetes Father   . Hyperlipidemia Father   . Hypertension Father   . Heart attack Father   . Peripheral vascular disease Father   . Other Father     amputation  . Diabetes Sister   . Hyperlipidemia Sister   . Hypertension Sister   . Deep vein thrombosis Brother   . Hyperlipidemia Brother   . Hypertension Brother     Allergies as of 07/24/2013  . (No Known Allergies)    No current outpatient prescriptions on file prior to visit.   No current facility-administered medications on file prior to visit.     REVIEW OF SYSTEMS:  Positives indicated with an "X"  CARDIOVASCULAR:  [ ] chest pain   [ ] chest pressure   [ ]   palpitations   [ ] orthopnea   [x ] dyspnea on exertion   [ ] claudication   [ ] rest pain   x[ ] DVT   [ ] phlebitis PULMONARY:   [ ] productive cough   [ ] asthma   [ ] wheezing NEUROLOGIC:   [ ] weakness  [ ] paresthesias  [ ] aphasia  [ ] amaurosis  [ ] dizziness HEMATOLOGIC:   [ ] bleeding problems   [ ] clotting disorders MUSCULOSKELETAL:  [ ] joint pain   [ ] joint swelling GASTROINTESTINAL: [ ]  blood in stool  [ ]  hematemesis GENITOURINARY:  [ ]  dysuria  [ ]  hematuria PSYCHIATRIC:  [ ] history of major depression INTEGUMENTARY:  [ ] rashes  [ ] ulcers CONSTITUTIONAL:  [ ] fever   [ ] chills  PHYSICAL EXAMINATION:  General: The patient is a well-nourished female, in no acute distress. Vital signs are BP 150/86  Pulse 69  Wt 259 lb (117.482 kg)  SpO2 96% Pulmonary: There is a good air exchange bilaterally without wheezing or rales.  Musculoskeletal: There are no major deformities.  There is no significant extremity  pain. Neurologic: No focal weakness or paresthesias are detected, Skin: There are no ulcer or rashes noted. Psychiatric: The patient has normal affect. Cardiovascular: There is a regular rate and rhythm without significant murmur appreciated. She does have 2+ radial pulses bilaterally Her neck exam reveals an old scar over the area of interest. She feels that this is related to a traumatic scrape as a child. No history of surgery over this area other than central line. She does have a large venous aneurysm at this area and with Valsalva or leaning forward can be approximately 5-6 cm in diameter and then decompresses   Vascular Lab Studies:  I imaged this area with SonoSite ultrasound it does appear to be her external jugular vein with a communication to the internal jugular vein. This becomes quite large with Valsalva  Impression and Plan:  Venous aneurysm in her right neck probably related to the external jugular vein. A long discussion with the patient and her husband. I do not feel any increased risk for bleeding or rupture. She's had this for many years and it is becoming enlarged and painful to her. I did explain that this could be excised as an outpatient. I explained we would make an incision to the old scar over this area and excise the venous aneurysm with a primary closure. I am I have recommended a CT of this area to rule out any other pathology that is not apparent on physical exam. Assuming this is no other findings patient does wish for excision of this at her convenience. We will schedule this following CT scan    [name] [facility] [place] Office: [phone]         

## 2013-07-26 ENCOUNTER — Ambulatory Visit
Admission: RE | Admit: 2013-07-26 | Discharge: 2013-07-26 | Disposition: A | Discharge status: Home / Self Care | Payer: Medicare Other | Source: Ambulatory Visit | Attending: Vascular Surgery | Admitting: Vascular Surgery

## 2013-07-26 DIAGNOSIS — I729 Aneurysm of unspecified site: Secondary | ICD-10-CM

## 2013-07-26 MED ORDER — IOHEXOL 350 MG/ML SOLN
100.0000 mL | Freq: Once | INTRAVENOUS | Status: AC | PRN
  Administered 2013-07-26: 100 mL via INTRAVENOUS

## 2013-08-06 ENCOUNTER — Other Ambulatory Visit: Payer: Self-pay

## 2013-08-07 ENCOUNTER — Encounter (HOSPITAL_COMMUNITY): Payer: Self-pay | Admitting: Pharmacy Technician

## 2013-08-08 ENCOUNTER — Other Ambulatory Visit (HOSPITAL_COMMUNITY): Payer: Self-pay | Admitting: *Deleted

## 2013-08-08 ENCOUNTER — Encounter (HOSPITAL_COMMUNITY): Payer: Self-pay

## 2013-08-08 ENCOUNTER — Inpatient Hospital Stay (HOSPITAL_COMMUNITY): Admission: RE | Admit: 2013-08-08 | Payer: Medicare Other | Source: Ambulatory Visit

## 2013-08-08 ENCOUNTER — Encounter (HOSPITAL_COMMUNITY)
Admission: RE | Admit: 2013-08-08 | Discharge: 2013-08-08 | Disposition: A | Discharge status: Home / Self Care | Payer: Medicare Other | Source: Ambulatory Visit | Attending: Vascular Surgery | Admitting: Vascular Surgery

## 2013-08-08 HISTORY — DX: Hypothyroidism, unspecified: E03.9

## 2013-08-08 HISTORY — DX: Other specified postprocedural states: Z98.890

## 2013-08-08 HISTORY — DX: Unspecified osteoarthritis, unspecified site: M19.90

## 2013-08-08 HISTORY — DX: Cardiac murmur, unspecified: R01.1

## 2013-08-08 HISTORY — DX: Nausea with vomiting, unspecified: R11.2

## 2013-08-08 HISTORY — DX: Other complications of anesthesia, initial encounter: T88.59XA

## 2013-08-08 HISTORY — DX: Adverse effect of unspecified anesthetic, initial encounter: T41.45XA

## 2013-08-08 LAB — APTT: aPTT: 30 seconds (ref 24–37)

## 2013-08-08 LAB — COMPREHENSIVE METABOLIC PANEL
AST: 19 U/L (ref 0–37)
CO2: 29 mEq/L (ref 19–32)
Calcium: 10.3 mg/dL (ref 8.4–10.5)
Creatinine, Ser: 0.79 mg/dL (ref 0.50–1.10)
GFR calc non Af Amer: 84 mL/min — ABNORMAL LOW (ref >90)
Potassium: 4.6 mEq/L (ref 3.5–5.1)
Total Bilirubin: 0.3 mg/dL (ref 0.3–1.2)
Total Protein: 7.7 g/dL (ref 6.0–8.3)

## 2013-08-08 LAB — CBC
Hemoglobin: 12.4 g/dL (ref 12.0–15.0)
MCH: 26.6 pg (ref 26.0–34.0)
MCHC: 31.6 g/dL (ref 30.0–36.0)
MCV: 84.3 fL (ref 78.0–100.0)
Platelets: 387 10*3/uL (ref 150–400)
RDW: 14 % (ref 11.5–15.5)

## 2013-08-08 LAB — URINALYSIS, ROUTINE W REFLEX MICROSCOPIC
Bilirubin Urine: NEGATIVE
Glucose, UA: NEGATIVE mg/dL
Hgb urine dipstick: NEGATIVE
Ketones, ur: NEGATIVE mg/dL
Leukocytes, UA: NEGATIVE
Protein, ur: NEGATIVE mg/dL
pH: 5.5 (ref 5.0–8.0)

## 2013-08-08 LAB — PROTIME-INR
INR: 0.96 (ref 0.00–1.49)
Prothrombin Time: 12.6 seconds (ref 11.6–15.2)

## 2013-08-08 LAB — SURGICAL PCR SCREEN
MRSA, PCR: NEGATIVE
Staphylococcus aureus: NEGATIVE

## 2013-08-08 LAB — TYPE AND SCREEN
ABO/RH(D): A POS
Antibody Screen: NEGATIVE

## 2013-08-08 NOTE — Pre-Procedure Instructions (Signed)
[name]  08/08/2013   Your procedure is scheduled on:  Friday, August 10, 2013 at 7:30 AM.   Report to [facility] Entrance "A" at 5:30 AM.   Call this number if you have problems the morning of surgery: [phone]   Remember:   Do not eat food or drink liquids after midnight Thursday, 08/09/13.   Take these medicines the morning of surgery with A SIP OF WATER: ALPRAZolam (XANAX), atenolol (TENORMIN), gabapentin (NEURONTIN), levothyroxine (SYNTHROID, LEVOTHROID), omeprazole (PRILOSEC), HYDROcodone-acetaminophen (NORCO/VICODIN) - if needed.   Stop all Vitamins, Herbal Medications, Fish Oil as of today, 08/08/13.         Do not wear jewelry, make-up or nail polish.  Do not wear lotions, powders, or perfumes. You may wear deodorant.  Do not shave 48 hours prior to surgery. .  Do not bring valuables to the hospital.  [facility] is not responsible                  for any belongings or valuables.               Contacts, dentures or bridgework may not be worn into surgery.  Leave suitcase in the car. After surgery it may be brought to your room.  For patients admitted to the hospital, discharge time is determined by your                treatment team.               Patients discharged the day of surgery will not be allowed to drive  home.  Name and phone number of your driver: Family/friend   Special Instructions: Shower using CHG 2 nights before surgery and the night before surgery.  If you shower the day of surgery use CHG.  Use special wash - you have one bottle of CHG for all showers.  You should use approximately 1/3 of the bottle for each shower.   Please read over the following fact sheets that you were given: Pain Booklet, Coughing and Deep Breathing, Blood Transfusion Information, MRSA Information and Surgical Site Infection Prevention

## 2013-08-09 MED ORDER — DEXTROSE 5 % IV SOLN
1.5000 g | INTRAVENOUS | Status: AC
  Administered 2013-08-10: 1.5 g via INTRAVENOUS
  Filled 2013-08-09: qty 1.5

## 2013-08-09 NOTE — Progress Notes (Signed)
Anesthesia Chart Review:  Patient is a 67 year old female scheduled for excision of venous aneurysm right neck on 08/10/13 by Dr. Arbie Cookey.  History includes non-smoker, DVT (not specified), PAD, heart murmur (trivial MR by echo 04/2013), nephrolithiasis, arthritis, hypothyroidism, anemia, multiple surgeries.  For anesthesia history she reported post-operative N/V and hard time waking up from cholecystectomy > 25 years ago. PCP is Dr. Creola Corn.  She was evaluated by cardiologist Dr. Verdis Prime on 05/22/13 for echocardiogram review and PRN cardiology follow-up was recommended.  EKG on 05/22/13 [facility]) showed NSR, LAFB, LVH. Echo on 04/17/13 showed: - Left ventricle: The cavity size was normal. There was mild concentric hypertrophy. Systolic function was normal. The estimated ejection fraction was in the range of 60% to 65%. Wall motion was normal; there were no regional wall motion abnormalities. Left ventricular diastolic function parameters were normal. - Mitral valve: Mildly thickened leaflets . Trivial regurgitation. - Left atrium: The atrium was normal in size. - Inferior vena cava: The vessel was dilated; the respirophasic diameter changes were blunted (< 50%); findings are consistent with elevated central venous pressure. - Pericardium, extracardiac: There was no pericardial effusion.  Cardiac cath on 01/17/08 showed:  1. Essentially normal coronaries. A minimal insignificant obstruction (20%) is present in the mid-portion of the right coronary.  EF 65-70%. 2. Systemic hypertension.  3. Elevated left ventricular end-diastolic pressure.  4. Minimal atherosclerotic disease of both renal arteries, but each is widely patent.  5. Non-cardiac chest pain.  Remote history of stress test in 2001.  CTA of the neck on 07/26/13 showed: Large saccular aneurysm projecting laterally from the right internal jugular vein at the level of the thyroid gland (Low lateral right neck/high supraclavicular  region). This measures 4 x 4 x 4.5 cm. The communication with the jugular vein appears to measure approximately 7.5 mm maximally.  Most recent comparison study of 06/30/2011 shows a diameter of 3 cm.   RUE venous duplex on 05/23/13 showed: 1. 3.8 cm saccular aneurysm or varix from the right internal jugular vein , which has been documented on prior studies dating back to 2008. These are generally benign, and complications like thrombosis, embolism, rupture, and thrombophlebitis are rarely reported.  2. Negative for right upper extremity DVT.   She is on atenolol, furosemide, history of LVH on echo, and HTN according to Dr. Katrinka Blazing 05/2013 office note--although HTN is not listed on her history.  Will order a CXR for the day of surgery--if one not received within the past year at her PCP office (requested from PCP, if available).  Preoperative labs noted.  Anticipate that she can proceed as planned.  Velna Ochs [facility] Short Stay Center/Anesthesiology Phone [phone] 08/09/2013 12:47 PM

## 2013-08-10 ENCOUNTER — Ambulatory Visit (HOSPITAL_COMMUNITY)
Admission: RE | Admit: 2013-08-10 | Discharge: 2013-08-10 | Disposition: A | Discharge status: Home / Self Care | Payer: Medicare Other | Source: Ambulatory Visit | Attending: Vascular Surgery | Admitting: Vascular Surgery

## 2013-08-10 ENCOUNTER — Encounter (HOSPITAL_COMMUNITY)
Admission: RE | Disposition: A | Discharge status: Home / Self Care | Payer: Self-pay | Source: Ambulatory Visit | Attending: Vascular Surgery

## 2013-08-10 ENCOUNTER — Encounter (HOSPITAL_COMMUNITY): Payer: Medicare Other | Admitting: Vascular Surgery

## 2013-08-10 ENCOUNTER — Ambulatory Visit (HOSPITAL_COMMUNITY): Payer: Medicare Other | Admitting: Anesthesiology

## 2013-08-10 ENCOUNTER — Ambulatory Visit (HOSPITAL_COMMUNITY): Payer: Medicare Other

## 2013-08-10 ENCOUNTER — Encounter (HOSPITAL_COMMUNITY): Payer: Self-pay | Admitting: Anesthesiology

## 2013-08-10 ENCOUNTER — Telehealth: Payer: Self-pay | Admitting: Vascular Surgery

## 2013-08-10 DIAGNOSIS — I82409 Acute embolism and thrombosis of unspecified deep veins of unspecified lower extremity: Secondary | ICD-10-CM | POA: Insufficient documentation

## 2013-08-10 DIAGNOSIS — I739 Peripheral vascular disease, unspecified: Secondary | ICD-10-CM | POA: Insufficient documentation

## 2013-08-10 DIAGNOSIS — E039 Hypothyroidism, unspecified: Secondary | ICD-10-CM | POA: Insufficient documentation

## 2013-08-10 DIAGNOSIS — I82C19 Acute embolism and thrombosis of unspecified internal jugular vein: Secondary | ICD-10-CM | POA: Insufficient documentation

## 2013-08-10 DIAGNOSIS — I82C29 Chronic embolism and thrombosis of unspecified internal jugular vein: Secondary | ICD-10-CM

## 2013-08-10 DIAGNOSIS — I10 Essential (primary) hypertension: Secondary | ICD-10-CM | POA: Insufficient documentation

## 2013-08-10 DIAGNOSIS — I868 Varicose veins of other specified sites: Secondary | ICD-10-CM

## 2013-08-10 HISTORY — PX: ENDARTERECTOMY: SHX5162

## 2013-08-10 SURGERY — ENDARTERECTOMY, CAROTID
Anesthesia: General | Site: Neck | Laterality: Right

## 2013-08-10 MED ORDER — MEPERIDINE HCL 25 MG/ML IJ SOLN: 6.2500 mg | INTRAMUSCULAR | Status: DC | PRN

## 2013-08-10 MED ORDER — FENTANYL CITRATE 0.05 MG/ML IJ SOLN
25.0000 ug | INTRAMUSCULAR | Status: DC | PRN
  Administered 2013-08-10 (×3): 50 ug via INTRAVENOUS

## 2013-08-10 MED ORDER — ARTIFICIAL TEARS OP OINT
TOPICAL_OINTMENT | OPHTHALMIC | Status: DC | PRN
  Administered 2013-08-10: 1 via OPHTHALMIC

## 2013-08-10 MED ORDER — MIDAZOLAM HCL 2 MG/2ML IJ SOLN: 0.5000 mg | Freq: Once | INTRAMUSCULAR | Status: DC | PRN

## 2013-08-10 MED ORDER — SODIUM CHLORIDE 0.9 % IV SOLN: INTRAVENOUS | Status: DC

## 2013-08-10 MED ORDER — OXYCODONE HCL 5 MG PO TABS
5.0000 mg | ORAL_TABLET | Freq: Once | ORAL | Status: AC | PRN
  Administered 2013-08-10: 5 mg via ORAL

## 2013-08-10 MED ORDER — OXYCODONE HCL 5 MG/5ML PO SOLN: 5.0000 mg | Freq: Once | ORAL | Status: AC | PRN

## 2013-08-10 MED ORDER — SUCCINYLCHOLINE CHLORIDE 20 MG/ML IJ SOLN
INTRAMUSCULAR | Status: DC | PRN
  Administered 2013-08-10: 100 mg via INTRAVENOUS

## 2013-08-10 MED ORDER — FENTANYL CITRATE 0.05 MG/ML IJ SOLN
INTRAMUSCULAR | Status: DC | PRN
  Administered 2013-08-10 (×2): 50 ug via INTRAVENOUS

## 2013-08-10 MED ORDER — HYDROCODONE-ACETAMINOPHEN 5-325 MG PO TABS: 1.0000 | ORAL_TABLET | ORAL | Status: DC | PRN

## 2013-08-10 MED ORDER — PROMETHAZINE HCL 25 MG/ML IJ SOLN: 6.2500 mg | INTRAMUSCULAR | Status: DC | PRN

## 2013-08-10 MED ORDER — FENTANYL CITRATE 0.05 MG/ML IJ SOLN
INTRAMUSCULAR | Status: AC
  Filled 2013-08-10: qty 2

## 2013-08-10 MED ORDER — ONDANSETRON HCL 4 MG/2ML IJ SOLN
INTRAMUSCULAR | Status: DC | PRN
  Administered 2013-08-10: 4 mg via INTRAVENOUS

## 2013-08-10 MED ORDER — LIDOCAINE HCL (PF) 1 % IJ SOLN
INTRAMUSCULAR | Status: AC
  Filled 2013-08-10: qty 30

## 2013-08-10 MED ORDER — SODIUM CHLORIDE 0.9 % IR SOLN
Status: DC | PRN
  Administered 2013-08-10: 08:00:00

## 2013-08-10 MED ORDER — OXYCODONE HCL 5 MG PO TABS
ORAL_TABLET | ORAL | Status: AC
  Filled 2013-08-10: qty 1

## 2013-08-10 MED ORDER — RIVAROXABAN 20 MG PO TABS: 20.0000 mg | ORAL_TABLET | Freq: Every day | ORAL | Status: DC

## 2013-08-10 MED ORDER — LACTATED RINGERS IV SOLN
INTRAVENOUS | Status: DC | PRN
  Administered 2013-08-10: 07:00:00 via INTRAVENOUS

## 2013-08-10 MED ORDER — LIDOCAINE HCL (CARDIAC) 20 MG/ML IV SOLN
INTRAVENOUS | Status: DC | PRN
  Administered 2013-08-10: 60 mg via INTRAVENOUS

## 2013-08-10 MED ORDER — PROPOFOL 10 MG/ML IV BOLUS
INTRAVENOUS | Status: DC | PRN
  Administered 2013-08-10 (×2): 50 mg via INTRAVENOUS
  Administered 2013-08-10: 200 mg via INTRAVENOUS

## 2013-08-10 MED ORDER — 0.9 % SODIUM CHLORIDE (POUR BTL) OPTIME
TOPICAL | Status: DC | PRN
  Administered 2013-08-10: 1000 mL

## 2013-08-10 MED ORDER — MIDAZOLAM HCL 5 MG/5ML IJ SOLN
INTRAMUSCULAR | Status: DC | PRN
  Administered 2013-08-10: 2 mg via INTRAVENOUS

## 2013-08-10 SURGICAL SUPPLY — 44 items
BENZOIN TINCTURE PRP APPL 2/3 (GAUZE/BANDAGES/DRESSINGS) ×2 IMPLANT
CANISTER SUCTION 2500CC (MISCELLANEOUS) ×2 IMPLANT
CATH ROBINSON RED A/P 18FR (CATHETERS) ×2 IMPLANT
CLIP LIGATING EXTRA MED SLVR (CLIP) ×2 IMPLANT
CLIP LIGATING EXTRA SM BLUE (MISCELLANEOUS) ×2 IMPLANT
COVER SURGICAL LIGHT HANDLE (MISCELLANEOUS) ×2 IMPLANT
CRADLE DONUT ADULT HEAD (MISCELLANEOUS) ×2 IMPLANT
DECANTER SPIKE VIAL GLASS SM (MISCELLANEOUS) IMPLANT
DRAIN HEMOVAC 1/8 X 5 (WOUND CARE) IMPLANT
DRAPE WARM FLUID 44X44 (DRAPE) ×2 IMPLANT
DRSG COVADERM 4X6 (GAUZE/BANDAGES/DRESSINGS) ×2 IMPLANT
ELECT REM PT RETURN 9FT ADLT (ELECTROSURGICAL) ×2
ELECTRODE REM PT RTRN 9FT ADLT (ELECTROSURGICAL) ×1 IMPLANT
EVACUATOR SILICONE 100CC (DRAIN) IMPLANT
GEL ULTRASOUND 20GR AQUASONIC (MISCELLANEOUS) IMPLANT
GLOVE BIOGEL PI IND STRL 7.0 (GLOVE) ×2 IMPLANT
GLOVE BIOGEL PI INDICATOR 7.0 (GLOVE) ×2
GLOVE SS BIOGEL STRL SZ 6.5 (GLOVE) ×1 IMPLANT
GLOVE SS BIOGEL STRL SZ 7.5 (GLOVE) ×1 IMPLANT
GLOVE SUPERSENSE BIOGEL SZ 6.5 (GLOVE) ×1
GLOVE SUPERSENSE BIOGEL SZ 7.5 (GLOVE) ×1
GLOVE SURG SS PI 7.0 STRL IVOR (GLOVE) ×4 IMPLANT
GOWN STRL NON-REIN LRG LVL3 (GOWN DISPOSABLE) ×4 IMPLANT
GOWN STRL REIN XL XLG (GOWN DISPOSABLE) ×4 IMPLANT
KIT BASIN OR (CUSTOM PROCEDURE TRAY) ×2 IMPLANT
KIT ROOM TURNOVER OR (KITS) ×2 IMPLANT
NEEDLE 22X1 1/2 (OR ONLY) (NEEDLE) IMPLANT
NS IRRIG 1000ML POUR BTL (IV SOLUTION) ×4 IMPLANT
PACK CAROTID (CUSTOM PROCEDURE TRAY) ×2 IMPLANT
PAD ARMBOARD 7.5X6 YLW CONV (MISCELLANEOUS) ×4 IMPLANT
SHUNT CAROTID BYPASS 10 (VASCULAR PRODUCTS) IMPLANT
SHUNT CAROTID BYPASS 12FRX15.5 (VASCULAR PRODUCTS) IMPLANT
STRIP CLOSURE SKIN 1/2X4 (GAUZE/BANDAGES/DRESSINGS) ×2 IMPLANT
SUT ETHILON 3 0 PS 1 (SUTURE) IMPLANT
SUT PROLENE 6 0 CC (SUTURE) ×2 IMPLANT
SUT SILK 3 0 (SUTURE)
SUT SILK 3-0 18XBRD TIE 12 (SUTURE) IMPLANT
SUT VIC AB 3-0 SH 27 (SUTURE) ×2
SUT VIC AB 3-0 SH 27X BRD (SUTURE) ×2 IMPLANT
SUT VICRYL 4-0 PS2 18IN ABS (SUTURE) ×2 IMPLANT
SYR CONTROL 10ML LL (SYRINGE) IMPLANT
TOWEL OR 17X24 6PK STRL BLUE (TOWEL DISPOSABLE) ×2 IMPLANT
TOWEL OR 17X26 10 PK STRL BLUE (TOWEL DISPOSABLE) ×2 IMPLANT
WATER STERILE IRR 1000ML POUR (IV SOLUTION) ×2 IMPLANT

## 2013-08-10 NOTE — Interval H&P Note (Signed)
History and Physical Interval Note:  08/10/2013 7:24 AM  [name]  has presented today for surgery, with the diagnosis of Venous Aneurysm Right Neck  The various methods of treatment have been discussed with the patient and family. After consideration of risks, benefits and other options for treatment, the patient has consented to  Procedure(s): Excision of Venous Aneurysm Right Neck (Right) as a surgical intervention .  The patient's history has been reviewed, patient examined, no change in status, stable for surgery.  I have reviewed the patient's chart and labs.  Questions were answered to the patient's satisfaction.     EARLY, TODD

## 2013-08-10 NOTE — Transfer of Care (Signed)
Immediate Anesthesia Transfer of Care Note  Patient: [name]  Procedure(s) Performed: Procedure(s): Excision of Venous Aneurysm Right Neck (Right)  Patient Location: PACU  Anesthesia Type:General  Level of Consciousness: awake, oriented and patient cooperative  Airway & Oxygen Therapy: Patient Spontanous Breathing and Patient connected to face mask oxygen  Post-op Assessment: Report given to PACU RN, Post -op Vital signs reviewed and stable and Patient moving all extremities X 4  Post vital signs: Reviewed and stable  Complications: No apparent anesthesia complications

## 2013-08-10 NOTE — H&P (View-Only) (Signed)
Vascular and Vein Specialist of [facility]   Patient name: [name] MRN: [id number] DOB: [birth year] Sex: female   Referred by: Danielle Dess  Reason for referral:  Chief Complaint  Patient presents with  . New Evaluation    questionable "big' aneurysmal dilation R side of neck - Dr. Danielle Dess    HISTORY OF PRESENT ILLNESS: Patient is a very pleasant 67 year old female seen today for evaluation of venous aneurysm in the base of her right neck. She reports this is been present for many years but over the past 6 months has become markedly more distended and also causes her pain. She notes this when she Valsalva's her lean forward. The she reports that this is causing her discomfort at night [name] has sleep in a position such is not putting pressure on this. She reports that she can awaken with the area being rather firm and she needs to eye manually massaged this to decompress the area. She has no evidence for history of DVT. He has no history of trauma. She has had multiple prior surgeries and feels that she has had eye a central line in this area in years past.  Past Medical History  Diagnosis Date  . Anemia   . DVT (deep venous thrombosis)     Past Surgical History  Procedure Laterality Date  . Filter    . Joint replacement Right 11/2003    (knee)multiple surgeries  . Joint replacement Left 12/2009    (knee)  . Joint replacement Left 2011, 2012    Hip  . Hip surgery Left 2008  . Total shoulder replacement  11/30/2010  . Back surgery      X14 [id number]  . Carpel tunner    . Carpal    . Cholecystectomy    . Tonsillectomy    . Tubal ligation    . Abdominal hysterectomy    . Mass excision      uterus    History   Social History  . Marital Status: Married    Spouse Name: N/A    Number of Children: N/A  . Years of Education: N/A   Occupational History  . Not on file.   Social History Main Topics  . Smoking status: Never Smoker   . Smokeless tobacco: Never Used  .  Alcohol Use: No  . Drug Use: No  . Sexual Activity: Not on file   Other Topics Concern  . Not on file   Social History Narrative  . No narrative on file    Family History  Problem Relation Age of Onset  . Diabetes Mother   . Heart disease Mother     before age 56  . Hyperlipidemia Mother   . Hypertension Mother   . Diabetes Father   . Hyperlipidemia Father   . Hypertension Father   . Heart attack Father   . Peripheral vascular disease Father   . Other Father     amputation  . Diabetes Sister   . Hyperlipidemia Sister   . Hypertension Sister   . Deep vein thrombosis Brother   . Hyperlipidemia Brother   . Hypertension Brother     Allergies as of 07/24/2013  . (No Known Allergies)    No current outpatient prescriptions on file prior to visit.   No current facility-administered medications on file prior to visit.     REVIEW OF SYSTEMS:  Positives indicated with an "X"  CARDIOVASCULAR:  [ ]  chest pain   [ ]  chest pressure   [ ]   palpitations   [ ]  orthopnea   [x ] dyspnea on exertion   [ ]  claudication   [ ]  rest pain   x[ ]  DVT   [ ]  phlebitis PULMONARY:   [ ]  productive cough   [ ]  asthma   [ ]  wheezing NEUROLOGIC:   [ ]  weakness  [ ]  paresthesias  [ ]  aphasia  [ ]  amaurosis  [ ]  dizziness HEMATOLOGIC:   [ ]  bleeding problems   [ ]  clotting disorders MUSCULOSKELETAL:  [ ]  joint pain   [ ]  joint swelling GASTROINTESTINAL: [ ]   blood in stool  [ ]   hematemesis GENITOURINARY:  [ ]   dysuria  [ ]   hematuria PSYCHIATRIC:  [ ]  history of major depression INTEGUMENTARY:  [ ]  rashes  [ ]  ulcers CONSTITUTIONAL:  [ ]  fever   [ ]  chills  PHYSICAL EXAMINATION:  General: The patient is a well-nourished female, in no acute distress. Vital signs are BP 150/86  Pulse 69  Wt 259 lb (117.482 kg)  SpO2 96% Pulmonary: There is a good air exchange bilaterally without wheezing or rales.  Musculoskeletal: There are no major deformities.  There is no significant extremity  pain. Neurologic: No focal weakness or paresthesias are detected, Skin: There are no ulcer or rashes noted. Psychiatric: The patient has normal affect. Cardiovascular: There is a regular rate and rhythm without significant murmur appreciated. She does have 2+ radial pulses bilaterally Her neck exam reveals an old scar over the area of interest. She feels that this is related to a traumatic scrape as a child. No history of surgery over this area other than central line. She does have a large venous aneurysm at this area and with Valsalva or leaning forward can be approximately 5-6 cm in diameter and then decompresses   Vascular Lab Studies:  I imaged this area with SonoSite ultrasound it does appear to be her external jugular vein with a communication to the internal jugular vein. This becomes quite large with Valsalva  Impression and Plan:  Venous aneurysm in her right neck probably related to the external jugular vein. A long discussion with the patient and her husband. I do not feel any increased risk for bleeding or rupture. She's had this for many years and it is becoming enlarged and painful to her. I did explain that this could be excised as an outpatient. I explained we would make an incision to the old scar over this area and excise the venous aneurysm with a primary closure. I am I have recommended a CT of this area to rule out any other pathology that is not apparent on physical exam. Assuming this is no other findings patient does wish for excision of this at her convenience. We will schedule this following CT scan    [name] [facility] [place] Office: [phone]

## 2013-08-10 NOTE — Anesthesia Postprocedure Evaluation (Signed)
  Anesthesia Post-op Note  Patient: [name]  Procedure(s) Performed: Procedure(s): Excision of Venous Aneurysm Right Neck (Right)  Patient Location: PACU  Anesthesia Type:General  Level of Consciousness: awake, alert , oriented and patient cooperative  Airway and Oxygen Therapy: Patient Spontanous Breathing  Post-op Pain: mild  Post-op Assessment: Post-op Vital signs reviewed, Patient's Cardiovascular Status Stable, Respiratory Function Stable, Patent Airway, No signs of Nausea or vomiting and Pain level controlled  Post-op Vital Signs: Reviewed and stable  Complications: No apparent anesthesia complications

## 2013-08-10 NOTE — Telephone Encounter (Addendum)
Message copied by Fredrich Birks on Fri Aug 10, 2013  1:23 PM ------      Message from: Melene Plan      Created: Fri Aug 10, 2013 10:27 AM                   ----- Message -----         From: Larina Earthly, MD         Sent: 08/10/2013   8:44 AM           To: Reuel Derby, Melene Plan, RN            Resection of right IJ venous aneurysm.  Asst Roczniac.  Will dc home today.  FU in three weeks. ------  08/10/13: spoke with patient, dpm

## 2013-08-10 NOTE — Op Note (Signed)
OPERATIVE REPORT  DATE OF SURGERY: 08/10/2013  PATIENT: [name], 67 y.o. female MRN: [id number]  DOB: 04-03-1946  PRE-OPERATIVE DIAGNOSIS: Venous aneurysm right internal jugular vein  POST-OPERATIVE DIAGNOSIS:  Same  PROCEDURE: Resection of a venous aneurysm right internal jugular vein  SURGEON:  Gretta Began, M.D.  PHYSICIAN ASSISTANT: Roczniak  ANESTHESIA:  Gen.  EBL: Less than 100 ml  Total I/O In: 500 [I.V.:500] Out: -   BLOOD ADMINISTERED: None  DRAINS: None  SPECIMEN: Right IJ aneurysm  COUNTS CORRECT:  YES  PLAN OF CARE: PACU stable   PATIENT DISPOSITION:  PACU - hemodynamically stable  PROCEDURE DETAILS: The patient was taken to the operating placed supine position where the area of the right neck was prepped and draped in usual sterile fashion. The patient will scar from the superficial cuts as a child and her right neck over the venous aneurysm. This scar was used to for the incision. The platysma was divided in line with the skin incision with electrocautery. Identified. The aneurysm was mobilized. The external jugular vein silk ties and divided. Mobilization was continued and there was a communication to the internal jugular vein as well. The vein aneurysm was mobilized its entirety and the communication to the internal jugular vein was ligated with 2-0 silk ties and divided. The specimen was passed off the field. The wound was irrigated with saline and hemostasis was obtained with electrocautery. The wound was closed with 3-0 Vicryl in the platysma layer and a running fashion. The skin was closed with a 4-0 subcuticular Vicryl stitch. Sterile dressing was applied. This was taken to the recovery in stable conditiondid arise from the aneurysm and this was a ligated with 2   Gretta Began, M.D. 08/10/2013 8:46 AM

## 2013-08-10 NOTE — Anesthesia Preprocedure Evaluation (Addendum)
Anesthesia Evaluation  Patient identified by MRN, date of birth, ID band Patient awake    Reviewed: Allergy & Precautions, H&P , NPO status , Patient's Chart, lab work & pertinent test results, reviewed documented beta blocker date and time   History of Anesthesia Complications (+) PONV and history of anesthetic complications  Airway Mallampati: II TM Distance: >3 FB Neck ROM: Full    Dental  (+) Teeth Intact and Dental Advisory Given   Pulmonary neg pulmonary ROS,  breath sounds clear to auscultation  Pulmonary exam normal       Cardiovascular hypertension, Pt. on medications and Pt. on home beta blockers + Peripheral Vascular Disease and DVT Rhythm:Regular Rate:Normal  7/14 ECHO: normal LVF, EF 65%, valves OK   Neuro/Psych negative neurological ROS     GI/Hepatic negative GI ROS, Neg liver ROS,   Endo/Other  Hypothyroidism Morbid obesity  Renal/GU negative Renal ROS     Musculoskeletal   Abdominal (+) + obese,   Peds  Hematology   Anesthesia Other Findings   Reproductive/Obstetrics                         Anesthesia Physical Anesthesia Plan  ASA: III  Anesthesia Plan: General   Post-op Pain Management:    Induction: Intravenous  Airway Management Planned: Oral ETT  Additional Equipment:   Intra-op Plan:   Post-operative Plan: Extubation in OR  Informed Consent: I have reviewed the patients History and Physical, chart, labs and discussed the procedure including the risks, benefits and alternatives for the proposed anesthesia with the patient or authorized representative who has indicated his/her understanding and acceptance.   Dental advisory given  Plan Discussed with: Surgeon and CRNA  Anesthesia Plan Comments: (Plan routine monitors, GETA)        Anesthesia Quick Evaluation

## 2013-08-10 NOTE — Anesthesia Procedure Notes (Signed)
Procedure Name: Intubation Date/Time: 08/10/2013 7:46 AM Performed by: Sherie Don Pre-anesthesia Checklist: Patient identified, Emergency Drugs available, Suction available, Patient being monitored and Timeout performed Patient Re-evaluated:Patient Re-evaluated prior to inductionOxygen Delivery Method: Circle system utilized Preoxygenation: Pre-oxygenation with 100% oxygen Intubation Type: IV induction Ventilation: Mask ventilation without difficulty Laryngoscope Size: Mac and 4 Grade View: Grade II Tube type: Oral Tube size: 7.0 mm Number of attempts: 1 Airway Equipment and Method: Stylet Placement Confirmation: ETT inserted through vocal cords under direct vision and positive ETCO2 Secured at: 22 cm Tube secured with: Tape Dental Injury: Teeth and Oropharynx as per pre-operative assessment

## 2013-08-10 NOTE — Preoperative (Signed)
Beta Blockers   Reason not to administer Beta Blockers:Atenolol taken 08/10/13

## 2013-08-13 ENCOUNTER — Encounter (HOSPITAL_COMMUNITY): Payer: Self-pay | Admitting: Vascular Surgery

## 2013-08-23 ENCOUNTER — Other Ambulatory Visit: Payer: Self-pay

## 2013-08-27 ENCOUNTER — Encounter: Payer: Self-pay | Admitting: Vascular Surgery

## 2013-08-28 ENCOUNTER — Encounter: Payer: Self-pay | Admitting: Vascular Surgery

## 2013-08-28 ENCOUNTER — Ambulatory Visit (INDEPENDENT_AMBULATORY_CARE_PROVIDER_SITE_OTHER): Payer: Medicare Other | Admitting: Vascular Surgery

## 2013-08-28 VITALS — BP 144/82 | HR 76 | Resp 18 | Ht 62.0 in | Wt 264.3 lb

## 2013-08-28 DIAGNOSIS — I729 Aneurysm of unspecified site: Secondary | ICD-10-CM | POA: Insufficient documentation

## 2013-08-28 NOTE — Progress Notes (Signed)
Patient presents today for followup of resection of a large venous aneurysm of the right internal jugular vein on 08/10/2013. He had a large saccular venous aneurysm arising from her internal jugular vein at the base of her neck. This was causing increasing discomfort and was markedly enlarged. She underwent uneventful resection of this as an outpatient at [facility]. She is returned to her baseline with no difficulty. She does report some hypersensitivity around the incision but no wound healing issues.  On physical exam the incision is completely healed and there is no evidence of venous fullness.  Impression and plan: Stable status post resection of large venous aneurysm right neck. She will resume full activities without limitations will see [place] again on an as-needed basis

## 2013-09-25 ENCOUNTER — Encounter: Payer: Self-pay | Admitting: Family

## 2013-09-25 ENCOUNTER — Ambulatory Visit (INDEPENDENT_AMBULATORY_CARE_PROVIDER_SITE_OTHER): Payer: Medicare Other | Admitting: Family

## 2013-09-25 VITALS — BP 169/91 | HR 67 | Temp 97.3°F | Resp 16 | Ht 63.75 in | Wt 262.0 lb

## 2013-09-25 DIAGNOSIS — I729 Aneurysm of unspecified site: Secondary | ICD-10-CM

## 2013-09-25 DIAGNOSIS — Z4802 Encounter for removal of sutures: Secondary | ICD-10-CM

## 2013-09-25 MED ORDER — CEPHALEXIN 500 MG PO CAPS: 500.0000 mg | ORAL_CAPSULE | Freq: Four times a day (QID) | ORAL | Status: DC

## 2013-09-25 NOTE — Progress Notes (Signed)
[facility] [place]  History of Present Illness  [name] is a 67 y.o. 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 Arbie Cookey.She had a large saccular venous aneurysm arising from her internal jugular vein at the base of her neck. This was causing increasing discomfort and was markedly enlarged. She underwent uneventful resection of this as an outpatient at [facility].  She presents today with complaint of top of incision is tender for the last couple of days and getting larger and more red. She has been washing the incision daily with soap and water and then applying Oil of Olay lotion to the incision. She is fingering the incision in the office. Patient states in 1993 she had an IV access in her right internal jugular vein, and after the IV catheter was removed the aneurysm seemed to enlarge over time until resected about 6 weeks ago.  Patient denies fever or chills, denies any drainage from her wound, no constitutional complaints.  PMHx includes severe OA of hips and lumbar spine problems, history of PE and DVT, has a venous embolic filter and takes coumadin.    Past Medical History  Diagnosis Date  . Anemia   . DVT (deep venous thrombosis)   . Heart murmur   . Hypothyroidism   . Peripheral vascular disease   . Kidney stones   . Arthritis     osteoarthritis  . Complication of anesthesia     hard time waking up after her gallbladder surgery 28 years ago  . PONV (postoperative nausea and vomiting)     Past Surgical History  Procedure Laterality Date  . Filter    . Joint replacement Right 11/2003    (knee)multiple surgeries  . Joint replacement Left 12/2009    (knee)  . Joint replacement Left 2011, 2012    Hip  . Hip surgery Left 2008  . Total shoulder replacement  11/30/2010  . Back surgery      X14 [id number]  . Carpel tunner    . Carpal    . Cholecystectomy    .  Tonsillectomy    . Tubal ligation    . Abdominal hysterectomy    . Mass excision      uterus  . Colonoscopy    . Cardiac catheterization    . Appendectomy    . Dilation and curettage of uterus    . Endarterectomy Right 08/10/2013    Procedure: Excision of Venous Aneurysm Right Neck;  Surgeon: Larina Earthly, MD;  Location[facility];  Service: Vascular;  Laterality: Right;    History   Social History  . Marital Status: Married    Spouse Name: N/A    Number of Children: N/A  . Years of Education: N/A   Occupational History  . Not on file.   Social History Main Topics  . Smoking status: Never Smoker   . Smokeless tobacco: Never Used  . Alcohol Use: No  . Drug Use: No  . Sexual Activity: Not on file   Other Topics Concern  . Not on file   Social History Narrative  . No narrative on file    Family History  Problem Relation Age of Onset  . Diabetes Mother   . Heart disease Mother     before age 59  . Hyperlipidemia Mother   . Hypertension Mother   . Diabetes Father   . Hyperlipidemia Father   .  Hypertension Father   . Heart attack Father   . Peripheral vascular disease Father   . Other Father     amputation  . Diabetes Sister   . Hyperlipidemia Sister   . Hypertension Sister   . Deep vein thrombosis Brother   . Hyperlipidemia Brother   . Hypertension Brother      Current Outpatient Prescriptions on File Prior to Visit  Medication Sig Dispense Refill  . ALPRAZolam (XANAX) 0.5 MG tablet Take 1 tablet by mouth 2 (two) times daily.      . Ascorbic Acid (VITAMIN C PO) Take 1 tablet by mouth 2 (two) times daily.       Marland Kitchen atenolol (TENORMIN) 25 MG tablet Take 1 tablet by mouth daily.      . baclofen (LIORESAL) 10 MG tablet Take 1 tablet by mouth 2 (two) times daily.      . Biotin 5000 MCG CAPS Take 1 capsule by mouth daily.      . Calcium-Magnesium-Vitamin D (CALCIUM 500 PO) Take 2 tablets by mouth daily.       Marland Kitchen CINNAMON PO Take 1 tablet by mouth 2 (two) times  daily.       . Cyanocobalamin (B-12 PO) Take 6,000 mcg by mouth daily.       Marland Kitchen docusate sodium (COLACE) 100 MG capsule Take 100 mg by mouth 2 (two) times daily.      . furosemide (LASIX) 20 MG tablet Take 40 mg by mouth daily.       Marland Kitchen gabapentin (NEURONTIN) 600 MG tablet Take 1 capsule by mouth 3 (three) times daily.      Marland Kitchen HYDROcodone-acetaminophen (NORCO/VICODIN) 5-325 MG per tablet Take 1-2 tablets by mouth every 4 (four) hours as needed for pain.  30 tablet  0  . levothyroxine (SYNTHROID, LEVOTHROID) 50 MCG tablet Take 1 tablet by mouth daily before breakfast. Five day per week      . Misc Natural Products (GLUCOSAMINE CHONDROITIN ADV PO) Take 1 tablet by mouth 2 (two) times daily.      . Multiple Vitamin (MULTIVITAMIN) tablet Take 1 tablet by mouth daily.      . Omega-3 Fatty Acids (FISH OIL PO) Take 1 tablet by mouth 2 (two) times daily.      Marland Kitchen omeprazole (PRILOSEC) 20 MG capsule Take 20 mg by mouth daily.      . Rivaroxaban (XARELTO) 20 MG TABS tablet Take 1 tablet (20 mg total) by mouth daily.  30 tablet    . simvastatin (ZOCOR) 20 MG tablet Take 1 tablet by mouth daily.       No current facility-administered medications on file prior to visit.    No Known Allergies  Physical Examination  Filed Vitals:   09/25/13 1623  BP: 169/91  Pulse: 67  Temp: 97.3 F (36.3 C)  TempSrc: Oral  Resp: 16  Height: 5' 3.75" (1.619 m)  Weight: 262 lb (118.842 kg)  SpO2: 100%    Body mass index is 45.34 kg/(m^2).  General: A&O x 3, WD, morbidly obese, accompanied by husband.  Neck: Supple, no nuchal rigidity, incision at right side of neck is well healed save for red circular area about 1/2 cm in diameter with a tiny raised white center, no drainage, mildly tender to touch, no extending redness, no streaking.  Pulmonary: Respirations are non-labored.  Cardiac: RRR.  Musculoskeletal: Walks with a limp, using a cane.  Neurologic: Grossly intact.  Psychiatric: Judgment intact, Mood &  affect appropriate for pt's clinical  situation.  Medical Decision Making  [name] is a 67 y.o. female who presents with small encapsulated pocket of purulent material at cephalic end of right side of neck incision from IJ vein aneurysm repair 6 weeks ago.  After cleansing her incision with an alcohol wipe, the superficial abscess was expressed of a small amount of purulent material, further expressed until no more purulent material could be expressed. No suture material was found as patient had thought was present. Triple antibiotic ointment was applied and a folded 4x4 to make a 2x2 clean gauze dressing was applied with paper tape.  Patient was advised to wash the incision daily with warm water and soap, to apply triple antibiotic ointment daily and cover with a 2x2 gauze dressing and eventually with a bandaid. She was also advised not to finger the incision unless she has just washed her hands, and not to apply anything to the incision other than triple antibiotic ointment. Keflex prescribed, 500 mg qid for 7 days, and to follow up with Dr. Arbie Cookey in a week.   Gail March, RN, MSN, FNP-C [facility] [place] Office: [phone]  Clinic MD: [facility]  09/25/2013, 4:44 PM

## 2013-10-01 ENCOUNTER — Encounter: Payer: Self-pay | Admitting: Vascular Surgery

## 2013-10-02 ENCOUNTER — Encounter: Payer: Self-pay | Admitting: Vascular Surgery

## 2013-10-02 ENCOUNTER — Ambulatory Visit (INDEPENDENT_AMBULATORY_CARE_PROVIDER_SITE_OTHER): Payer: Medicare Other | Admitting: Vascular Surgery

## 2013-10-02 VITALS — BP 149/78 | HR 75 | Ht 63.0 in | Wt 263.0 lb

## 2013-10-02 DIAGNOSIS — I729 Aneurysm of unspecified site: Secondary | ICD-10-CM

## 2013-10-02 NOTE — Progress Notes (Signed)
The patient presents today for followup of her incision from her venous aneurysm resection on 08/10/2013. She been seen last week with some erythema over this. She reports that she had had this opening with a small amount of purulence in the upper pole of her incision. She has all our nurse practitioner last week and was placed on oral antibiotics. She is here today for followup. Incision is completely closed. She does have some reaction from the Band-Aid tape and some minimal erythema at the upper pole incision. The remaining portion of the incision is completely healed with no erythema. There is no fluctuance and no drainage. I feel that she is to resolve this suspect it was stitch abscess from Vicryl suture. She will continue to allow this to not have a dressing since there is no drainage. There is nothing prosthetic in the incision so therefore a felt this was coursed very quickly. She will see [place] again on an as-needed basis

## 2013-10-03 ENCOUNTER — Encounter: Payer: Self-pay | Admitting: *Deleted

## 2013-11-29 ENCOUNTER — Encounter (HOSPITAL_COMMUNITY): Payer: Self-pay | Admitting: Pharmacy Technician

## 2013-12-03 ENCOUNTER — Encounter (HOSPITAL_COMMUNITY)
Admission: RE | Admit: 2013-12-03 | Discharge: 2013-12-03 | Disposition: A | Discharge status: Home / Self Care | Payer: Medicare PPO | Source: Ambulatory Visit | Attending: Orthopedic Surgery | Admitting: Orthopedic Surgery

## 2013-12-03 ENCOUNTER — Encounter (HOSPITAL_COMMUNITY): Payer: Self-pay

## 2013-12-03 DIAGNOSIS — Z01812 Encounter for preprocedural laboratory examination: Secondary | ICD-10-CM | POA: Insufficient documentation

## 2013-12-03 DIAGNOSIS — Z01810 Encounter for preprocedural cardiovascular examination: Secondary | ICD-10-CM | POA: Insufficient documentation

## 2013-12-03 HISTORY — DX: Essential (primary) hypertension: I10

## 2013-12-03 HISTORY — DX: Personal history of urinary calculi: Z87.442

## 2013-12-03 HISTORY — DX: Pure hypercholesterolemia, unspecified: E78.00

## 2013-12-03 HISTORY — DX: Gastro-esophageal reflux disease without esophagitis: K21.9

## 2013-12-03 LAB — URINALYSIS, ROUTINE W REFLEX MICROSCOPIC
BILIRUBIN URINE: NEGATIVE
Glucose, UA: NEGATIVE mg/dL
Ketones, ur: NEGATIVE mg/dL
Leukocytes, UA: NEGATIVE
NITRITE: NEGATIVE
PROTEIN: NEGATIVE mg/dL
Specific Gravity, Urine: 1.008 (ref 1.005–1.030)
UROBILINOGEN UA: 0.2 mg/dL (ref 0.0–1.0)
pH: 5.5 (ref 5.0–8.0)

## 2013-12-03 LAB — CBC
HCT: 36.4 % (ref 36.0–46.0)
Hemoglobin: 11.3 g/dL — ABNORMAL LOW (ref 12.0–15.0)
MCH: 25.1 pg — ABNORMAL LOW (ref 26.0–34.0)
MCHC: 31 g/dL (ref 30.0–36.0)
MCV: 80.7 fL (ref 78.0–100.0)
PLATELETS: 262 10*3/uL (ref 150–400)
RBC: 4.51 MIL/uL (ref 3.87–5.11)
RDW: 14.7 % (ref 11.5–15.5)
WBC: 5.8 10*3/uL (ref 4.0–10.5)

## 2013-12-03 LAB — BASIC METABOLIC PANEL
BUN: 16 mg/dL (ref 6–23)
CALCIUM: 10.7 mg/dL — AB (ref 8.4–10.5)
CO2: 28 mEq/L (ref 19–32)
Chloride: 101 mEq/L (ref 96–112)
Creatinine, Ser: 0.69 mg/dL (ref 0.50–1.10)
GFR calc Af Amer: >90 mL/min (ref >90)
GFR, EST NON AFRICAN AMERICAN: 88 mL/min — AB (ref >90)
Glucose, Bld: 99 mg/dL (ref 70–99)
POTASSIUM: 4.4 meq/L (ref 3.7–5.3)
Sodium: 142 mEq/L (ref 137–147)

## 2013-12-03 LAB — SURGICAL PCR SCREEN
MRSA, PCR: NEGATIVE
STAPHYLOCOCCUS AUREUS: NEGATIVE

## 2013-12-03 LAB — PROTIME-INR
INR: 1.2 (ref 0.00–1.49)
PROTHROMBIN TIME: 14.9 s (ref 11.6–15.2)

## 2013-12-03 LAB — URINE MICROSCOPIC-ADD ON

## 2013-12-03 LAB — APTT: aPTT: 31 seconds (ref 24–37)

## 2013-12-03 NOTE — Patient Instructions (Addendum)
20 [name]  12/03/2013   Your procedure is scheduled on: 12/07/13  [facility] 10:30 AM.  Call this number if you have problems the morning of surgery [phone]: [phone]   Remember:   Do not eat food or drink liquids After Midnight.     Take these medicines the morning of surgery with A SIP OF WATER: xanax if needed, atenolol, gabapentin, synthroid, omeprazole, simvastatin, hydrocodone if needed   Do not wear jewelry, make-up or nail polish.  Do not wear lotions, powders, or perfumes. You may wear deodorant.  Do not shave 48 hours prior to surgery. Men may shave face and neck.  Do not bring valuables to the hospital.  Contacts, dentures or bridgework may not be worn into surgery.  Leave suitcase in the car. After surgery it may be brought to your room.  For patients admitted to the hospital, checkout time is 11:00 AM the day of discharge.    Please read over the following fact sheets that you were given:[facility] preparing for surgery sheet, MRSA Information, incentive spirometry fact sheet, blood fact sheet Birdie Sonsachel Carlyn Lemke, RN  pre op nurse call if needed [phone]    FAILURE TO FOLLOW THESE INSTRUCTIONS MAY RESULT IN CANCELLATION OF YOUR SURGERY   Patient Signature: ___________________________________________

## 2013-12-03 NOTE — Progress Notes (Signed)
Chest x-ray 10/03/13 on chart

## 2013-12-05 NOTE — H&P (Signed)
TOTAL HIP REVISION ADMISSION H&P  Patient is admitted for revision of constrained liner left total hip arthroplasty & right CMC joint injection.  Subjective:  Chief Complaint:   S/P revision left THA with slip of constrained liner  HPI: [name], 68 y.o. female, who presented to the clinic for hip pain. On 11/21/2013  she was bending over when she felt like her hip subluxed and since then she has had pain and discomfort in her hip, but no more feelings of that subluxation-type pain.  She had a previous revision with a constrained liner placed in May of 2012 by Dr. Charlann Boxer.  Onset of symptoms was abrupt with rapidlly worsening course since that time.  Prior procedures on the left hip include total hip revision with constained liner.  Patient currently rates pain in the left hip at 10 out of 10 with activity.  There is night pain, worsening of pain with activity and weight bearing, trendelenberg gait, pain that interfers with activities of daily living and pain with passive range of motion. Risks, benefits and expectations were discussed with the patient.  Risks including but not limited to the risk of anesthesia, blood clots, nerve damage, blood vessel damage, failure of the prosthesis, infection and up to and including death.  Patient understand the risks, benefits and expectations and wishes to proceed with surgery.   D/C Plans:   Home with HHPT  Post-op Meds:     No Rx given  Tranexamic Acid:   Not to be given - Previous PE  Decadron:    To be given  FYI:    Xarelto post-op (10mg  for 2-3 days and then pre-op dose)  Oxycodone post-op  Patient Active Problem List   Diagnosis Date Noted  . Visit for suture removal 09/25/2013  . Aneurysm of unspecified site 08/28/2013   Past Medical History  Diagnosis Date  . Anemia   . DVT (deep venous thrombosis)     bilateral lungs  . Hypothyroidism   . Peripheral vascular disease   . Arthritis     osteoarthritis  . Complication of anesthesia      hard time waking up after her gallbladder surgery 28 years ago  . PONV (postoperative nausea and vomiting)   . Hypertension   . Hypercholesteremia     under control  . Heart murmur   . History of kidney stones   . GERD (gastroesophageal reflux disease)     hx of    Past Surgical History  Procedure Laterality Date  . Filter Left 2009    IVC  . Hip surgery Left 2008  . Total shoulder replacement Right 11/30/2010  . Back surgery      X14 [id number]  . Mass excision  1974    uterus  . Colonoscopy    . Dilation and curettage of uterus    . Endarterectomy Right 08/10/2013    Procedure: Excision of Venous Aneurysm Right Neck;  Surgeon: Larina Earthly, MD;  Location: [facility] OR;  Service: Vascular;  Laterality: Right;  . Joint replacement Right 11/2003    (knee)multiple surgeries  . Joint replacement Left 12/2009    (knee)  . Joint replacement Left 2011, 2012    Hip  . Carpal tunnel release Bilateral   . Cholecystectomy  1997  . Tonsillectomy  1986  . Tubal ligation  1973  . Abdominal hysterectomy  1975    partial  . Appendectomy  1973  . Cardiac catheterization  ~2010    No prescriptions  prior to admission   No Known Allergies   History  Substance Use Topics  . Smoking status: Never Smoker   . Smokeless tobacco: Never Used  . Alcohol Use: No    Family History  Problem Relation Age of Onset  . Diabetes Mother   . Heart disease Mother     before age 34  . Hyperlipidemia Mother   . Hypertension Mother   . Diabetes Father   . Hyperlipidemia Father   . Hypertension Father   . Heart attack Father   . Peripheral vascular disease Father   . Other Father     amputation  . Diabetes Sister   . Hyperlipidemia Sister   . Hypertension Sister   . Deep vein thrombosis Brother   . Hyperlipidemia Brother   . Hypertension Brother       Review of Systems  Constitutional: Negative.   HENT: Negative.   Eyes: Negative.   Respiratory: Negative.   Cardiovascular: Negative.    Gastrointestinal: Positive for heartburn.  Genitourinary: Negative.   Musculoskeletal: Positive for joint pain.  Skin: Negative.   Neurological: Negative.   Endo/Heme/Allergies: Negative.   Psychiatric/Behavioral: Negative.     Objective:  Physical Exam  Constitutional: She is oriented to person, place, and time. She appears well-developed and well-nourished.  HENT:  Head: Normocephalic and atraumatic.  Mouth/Throat: Oropharynx is clear and moist.  Eyes: Pupils are equal, round, and reactive to light.  Neck: Neck supple. No JVD present. No tracheal deviation present. No thyromegaly present.  Cardiovascular: Normal rate, regular rhythm, normal heart sounds and intact distal pulses.   Respiratory: Effort normal and breath sounds normal. No respiratory distress. She has no wheezes.  GI: Soft. There is no tenderness. There is no guarding.  Musculoskeletal:       Left hip: She exhibits decreased range of motion, decreased strength, tenderness, bony tenderness, swelling and laceration (healed). She exhibits no crepitus and no deformity.  Lymphadenopathy:    She has no cervical adenopathy.  Neurological: She is alert and oriented to person, place, and time.  Skin: Skin is warm and dry.  Psychiatric: She has a normal mood and affect.    Labs:  Estimated body mass index is 46.60 kg/(m^2) as calculated from the following:   Height as of 10/02/13: 5\' 3"  (1.6 m).   Weight as of 10/02/13: 119.296 kg (263 lb).  Imaging Review:  Plain radiographs demonstrate previous total hip arthroplasty with constrained liner ring slipped off of poly in the left hip(s). There is evidence of loosening of the constrained liner ring.The bone quality appears to be good for age and reported activity level.   Assessment/Plan:  Left hip with failed previous arthroplasty.  The patient history, physical examination, clinical judgement of the provider and imaging studies are consistent with end stage  degenerative joint disease of the left hip(s), previous total hip arthroplasty. Revision total hip arthroplasty is deemed medically necessary. The treatment options including medical management, injection therapy, arthroscopy and arthroplasty were discussed at length. The risks and benefits of total hip arthroplasty were presented and reviewed. The risks due to aseptic loosening, infection, stiffness, dislocation/subluxation,  thromboembolic complications and other imponderables were discussed.  The patient acknowledged the explanation, agreed to proceed with the plan and consent was signed. Patient is being admitted for inpatient treatment for surgery, pain control, PT, OT, prophylactic antibiotics, VTE prophylaxis, progressive ambulation and ADL's and discharge planning. The patient is planning to be discharged home with home health services.  Anastasio AuerbachMatthew S. Classie Weng   PAC  12/05/2013, 1:29 PM

## 2013-12-07 ENCOUNTER — Encounter (HOSPITAL_COMMUNITY)
Admission: RE | Disposition: A | Discharge status: Home Health | Payer: Self-pay | Source: Home / Self Care | Attending: Orthopedic Surgery

## 2013-12-07 ENCOUNTER — Inpatient Hospital Stay (HOSPITAL_COMMUNITY): Payer: Medicare PPO

## 2013-12-07 ENCOUNTER — Encounter (HOSPITAL_COMMUNITY): Payer: Medicare PPO | Admitting: Anesthesiology

## 2013-12-07 ENCOUNTER — Encounter (HOSPITAL_COMMUNITY): Payer: Self-pay | Admitting: *Deleted

## 2013-12-07 ENCOUNTER — Inpatient Hospital Stay (HOSPITAL_COMMUNITY): Payer: Medicare PPO | Admitting: Anesthesiology

## 2013-12-07 ENCOUNTER — Inpatient Hospital Stay (HOSPITAL_COMMUNITY)
Admission: RE | Admit: 2013-12-07 | Discharge: 2013-12-09 | DRG: 467 | Disposition: A | Discharge status: Home Health | Payer: Medicare PPO | Attending: Orthopedic Surgery | Admitting: Orthopedic Surgery

## 2013-12-07 DIAGNOSIS — Z8249 Family history of ischemic heart disease and other diseases of the circulatory system: Secondary | ICD-10-CM

## 2013-12-07 DIAGNOSIS — Y831 Surgical operation with implant of artificial internal device as the cause of abnormal reaction of the patient, or of later complication, without mention of misadventure at the time of the procedure: Secondary | ICD-10-CM | POA: Diagnosis present

## 2013-12-07 DIAGNOSIS — M169 Osteoarthritis of hip, unspecified: Secondary | ICD-10-CM | POA: Diagnosis present

## 2013-12-07 DIAGNOSIS — Z6841 Body Mass Index (BMI) 40.0 and over, adult: Secondary | ICD-10-CM

## 2013-12-07 DIAGNOSIS — E7800 Pure hypercholesterolemia, unspecified: Secondary | ICD-10-CM | POA: Diagnosis present

## 2013-12-07 DIAGNOSIS — Z01812 Encounter for preprocedural laboratory examination: Secondary | ICD-10-CM

## 2013-12-07 DIAGNOSIS — Z96619 Presence of unspecified artificial shoulder joint: Secondary | ICD-10-CM

## 2013-12-07 DIAGNOSIS — I10 Essential (primary) hypertension: Secondary | ICD-10-CM | POA: Diagnosis present

## 2013-12-07 DIAGNOSIS — K219 Gastro-esophageal reflux disease without esophagitis: Secondary | ICD-10-CM | POA: Diagnosis present

## 2013-12-07 DIAGNOSIS — M19049 Primary osteoarthritis, unspecified hand: Secondary | ICD-10-CM | POA: Diagnosis present

## 2013-12-07 DIAGNOSIS — R011 Cardiac murmur, unspecified: Secondary | ICD-10-CM | POA: Diagnosis present

## 2013-12-07 DIAGNOSIS — Z96649 Presence of unspecified artificial hip joint: Secondary | ICD-10-CM

## 2013-12-07 DIAGNOSIS — I739 Peripheral vascular disease, unspecified: Secondary | ICD-10-CM | POA: Diagnosis present

## 2013-12-07 DIAGNOSIS — Z86711 Personal history of pulmonary embolism: Secondary | ICD-10-CM

## 2013-12-07 DIAGNOSIS — E039 Hypothyroidism, unspecified: Secondary | ICD-10-CM | POA: Diagnosis present

## 2013-12-07 DIAGNOSIS — T84099A Other mechanical complication of unspecified internal joint prosthesis, initial encounter: Principal | ICD-10-CM | POA: Diagnosis present

## 2013-12-07 DIAGNOSIS — Z01810 Encounter for preprocedural cardiovascular examination: Secondary | ICD-10-CM

## 2013-12-07 DIAGNOSIS — M1610 Unilateral primary osteoarthritis, unspecified hip: Secondary | ICD-10-CM | POA: Diagnosis present

## 2013-12-07 HISTORY — PX: TOTAL HIP REVISION: SHX763

## 2013-12-07 LAB — TYPE AND SCREEN
ABO/RH(D): A POS
Antibody Screen: NEGATIVE

## 2013-12-07 SURGERY — TOTAL HIP REVISION
Anesthesia: General | Site: Hip | Laterality: Left

## 2013-12-07 MED ORDER — ATENOLOL 25 MG PO TABS
25.0000 mg | ORAL_TABLET | Freq: Every morning | ORAL | Status: DC
Start: 2013-12-08 — End: 2013-12-09
  Administered 2013-12-09: 25 mg via ORAL
  Filled 2013-12-07 (×2): qty 1

## 2013-12-07 MED ORDER — ONDANSETRON HCL 4 MG/2ML IJ SOLN
INTRAMUSCULAR | Status: AC
  Filled 2013-12-07: qty 2

## 2013-12-07 MED ORDER — DEXAMETHASONE SODIUM PHOSPHATE 10 MG/ML IJ SOLN
INTRAMUSCULAR | Status: AC
  Filled 2013-12-07: qty 1

## 2013-12-07 MED ORDER — PHENOL 1.4 % MT LIQD: 1.0000 | OROMUCOSAL | Status: DC | PRN

## 2013-12-07 MED ORDER — PROPOFOL 10 MG/ML IV BOLUS
INTRAVENOUS | Status: DC | PRN
  Administered 2013-12-07: 175 mg via INTRAVENOUS

## 2013-12-07 MED ORDER — SIMVASTATIN 20 MG PO TABS
20.0000 mg | ORAL_TABLET | Freq: Every morning | ORAL | Status: DC
  Administered 2013-12-08 – 2013-12-09 (×2): 20 mg via ORAL
  Filled 2013-12-07 (×2): qty 1

## 2013-12-07 MED ORDER — BISACODYL 10 MG RE SUPP: 10.0000 mg | Freq: Every day | RECTAL | Status: DC | PRN

## 2013-12-07 MED ORDER — DEXAMETHASONE SODIUM PHOSPHATE 10 MG/ML IJ SOLN
INTRAMUSCULAR | Status: DC | PRN
  Administered 2013-12-07: 10 mg via INTRAVENOUS

## 2013-12-07 MED ORDER — HYDROMORPHONE HCL PF 1 MG/ML IJ SOLN
INTRAMUSCULAR | Status: AC
  Filled 2013-12-07: qty 1

## 2013-12-07 MED ORDER — HYDROCODONE-ACETAMINOPHEN 7.5-325 MG PO TABS: 1.0000 | ORAL_TABLET | ORAL | Status: DC | PRN

## 2013-12-07 MED ORDER — DEXAMETHASONE SODIUM PHOSPHATE 10 MG/ML IJ SOLN
10.0000 mg | Freq: Once | INTRAMUSCULAR | Status: AC
  Administered 2013-12-08: 10 mg via INTRAVENOUS
  Filled 2013-12-07: qty 1

## 2013-12-07 MED ORDER — DOCUSATE SODIUM 100 MG PO CAPS
100.0000 mg | ORAL_CAPSULE | Freq: Two times a day (BID) | ORAL | Status: DC
  Administered 2013-12-07 – 2013-12-09 (×4): 100 mg via ORAL

## 2013-12-07 MED ORDER — METHOCARBAMOL 500 MG PO TABS
500.0000 mg | ORAL_TABLET | Freq: Four times a day (QID) | ORAL | Status: DC | PRN
  Administered 2013-12-08 – 2013-12-09 (×3): 500 mg via ORAL
  Filled 2013-12-07 (×3): qty 1

## 2013-12-07 MED ORDER — 0.9 % SODIUM CHLORIDE (POUR BTL) OPTIME
TOPICAL | Status: DC | PRN
  Administered 2013-12-07: 1000 mL

## 2013-12-07 MED ORDER — TRIAMCINOLONE ACETONIDE 40 MG/ML IJ SUSP
INTRAMUSCULAR | Status: DC | PRN
  Administered 2013-12-07: 40 mg

## 2013-12-07 MED ORDER — PANTOPRAZOLE SODIUM 40 MG PO TBEC
40.0000 mg | DELAYED_RELEASE_TABLET | Freq: Every day | ORAL | Status: DC
  Administered 2013-12-08 – 2013-12-09 (×2): 40 mg via ORAL
  Filled 2013-12-07 (×2): qty 1

## 2013-12-07 MED ORDER — LACTATED RINGERS IV SOLN
INTRAVENOUS | Status: DC | PRN
  Administered 2013-12-07 (×2): via INTRAVENOUS

## 2013-12-07 MED ORDER — ONDANSETRON HCL 4 MG/2ML IJ SOLN
INTRAMUSCULAR | Status: DC | PRN
  Administered 2013-12-07 (×2): 4 mg via INTRAVENOUS

## 2013-12-07 MED ORDER — TRIAMCINOLONE ACETONIDE 40 MG/ML IJ SUSP
INTRAMUSCULAR | Status: AC
  Filled 2013-12-07: qty 1

## 2013-12-07 MED ORDER — METOCLOPRAMIDE HCL 10 MG PO TABS: 5.0000 mg | ORAL_TABLET | Freq: Three times a day (TID) | ORAL | Status: DC | PRN

## 2013-12-07 MED ORDER — HYDROMORPHONE HCL PF 1 MG/ML IJ SOLN
0.2500 mg | INTRAMUSCULAR | Status: DC | PRN
  Administered 2013-12-07 (×4): 0.5 mg via INTRAVENOUS

## 2013-12-07 MED ORDER — FLEET ENEMA 7-19 GM/118ML RE ENEM: 1.0000 | ENEMA | Freq: Once | RECTAL | Status: AC | PRN

## 2013-12-07 MED ORDER — FENTANYL CITRATE 0.05 MG/ML IJ SOLN
INTRAMUSCULAR | Status: AC
Start: 2013-12-07 — End: 2013-12-07
  Filled 2013-12-07: qty 2

## 2013-12-07 MED ORDER — ALUM & MAG HYDROXIDE-SIMETH 200-200-20 MG/5ML PO SUSP
30.0000 mL | ORAL | Status: DC | PRN
  Administered 2013-12-08 (×2): 30 mL via ORAL
  Filled 2013-12-07 (×2): qty 30

## 2013-12-07 MED ORDER — POLYETHYLENE GLYCOL 3350 17 G PO PACK
17.0000 g | PACK | Freq: Two times a day (BID) | ORAL | Status: DC
  Administered 2013-12-07 – 2013-12-09 (×4): 17 g via ORAL

## 2013-12-07 MED ORDER — FENTANYL CITRATE 0.05 MG/ML IJ SOLN
25.0000 ug | INTRAMUSCULAR | Status: DC | PRN
  Administered 2013-12-07 (×3): 25 ug via INTRAVENOUS

## 2013-12-07 MED ORDER — KETOROLAC TROMETHAMINE 15 MG/ML IJ SOLN
7.5000 mg | Freq: Four times a day (QID) | INTRAMUSCULAR | Status: DC | PRN
  Administered 2013-12-07 – 2013-12-08 (×2): 7.5 mg via INTRAVENOUS
  Filled 2013-12-07: qty 1

## 2013-12-07 MED ORDER — PROPOFOL 10 MG/ML IV BOLUS
INTRAVENOUS | Status: AC
  Filled 2013-12-07: qty 20

## 2013-12-07 MED ORDER — ONDANSETRON HCL 4 MG PO TABS: 4.0000 mg | ORAL_TABLET | Freq: Four times a day (QID) | ORAL | Status: DC | PRN

## 2013-12-07 MED ORDER — LEVOTHYROXINE SODIUM 50 MCG PO TABS
50.0000 ug | ORAL_TABLET | Freq: Every day | ORAL | Status: DC
  Administered 2013-12-08 – 2013-12-09 (×2): 50 ug via ORAL
  Filled 2013-12-07 (×3): qty 1

## 2013-12-07 MED ORDER — GABAPENTIN 600 MG PO TABS
600.0000 mg | ORAL_TABLET | Freq: Three times a day (TID) | ORAL | Status: DC
  Administered 2013-12-07: 600 mg via ORAL
  Filled 2013-12-07 (×4): qty 1

## 2013-12-07 MED ORDER — LIDOCAINE HCL 1 % IJ SOLN
INTRAMUSCULAR | Status: AC
  Filled 2013-12-07: qty 20

## 2013-12-07 MED ORDER — SODIUM CHLORIDE 0.9 % IV SOLN
100.0000 mL/h | INTRAVENOUS | Status: DC
  Administered 2013-12-07 – 2013-12-08 (×2): 100 mL/h via INTRAVENOUS
  Filled 2013-12-07 (×10): qty 1000

## 2013-12-07 MED ORDER — METOCLOPRAMIDE HCL 5 MG/ML IJ SOLN
5.0000 mg | Freq: Three times a day (TID) | INTRAMUSCULAR | Status: DC | PRN
Start: 2013-12-07 — End: 2013-12-09

## 2013-12-07 MED ORDER — CEFAZOLIN SODIUM-DEXTROSE 2-3 GM-% IV SOLR
2.0000 g | INTRAVENOUS | Status: AC
  Administered 2013-12-07: 2 g via INTRAVENOUS

## 2013-12-07 MED ORDER — CELECOXIB 200 MG PO CAPS
200.0000 mg | ORAL_CAPSULE | Freq: Two times a day (BID) | ORAL | Status: DC
  Administered 2013-12-07 – 2013-12-09 (×4): 200 mg via ORAL
  Filled 2013-12-07 (×5): qty 1

## 2013-12-07 MED ORDER — ALPRAZOLAM 0.5 MG PO TABS
0.5000 mg | ORAL_TABLET | Freq: Two times a day (BID) | ORAL | Status: DC
  Administered 2013-12-07 – 2013-12-09 (×4): 0.5 mg via ORAL
  Filled 2013-12-07 (×4): qty 1

## 2013-12-07 MED ORDER — ONDANSETRON HCL 4 MG/2ML IJ SOLN
4.0000 mg | Freq: Four times a day (QID) | INTRAMUSCULAR | Status: DC | PRN
  Administered 2013-12-07 – 2013-12-08 (×2): 4 mg via INTRAVENOUS
  Filled 2013-12-07 (×2): qty 2

## 2013-12-07 MED ORDER — LIDOCAINE HCL (CARDIAC) 20 MG/ML IV SOLN
INTRAVENOUS | Status: DC | PRN
  Administered 2013-12-07: 75 mg via INTRAVENOUS

## 2013-12-07 MED ORDER — HYDROMORPHONE HCL PF 2 MG/ML IJ SOLN
INTRAMUSCULAR | Status: AC
  Filled 2013-12-07: qty 1

## 2013-12-07 MED ORDER — BACLOFEN 10 MG PO TABS: 10.0000 mg | ORAL_TABLET | Freq: Three times a day (TID) | ORAL | Status: DC

## 2013-12-07 MED ORDER — CISATRACURIUM BESYLATE (PF) 10 MG/5ML IV SOLN
INTRAVENOUS | Status: DC | PRN
  Administered 2013-12-07: 10 mg via INTRAVENOUS
  Administered 2013-12-07 (×2): 4 mg via INTRAVENOUS

## 2013-12-07 MED ORDER — FENTANYL CITRATE 0.05 MG/ML IJ SOLN
INTRAMUSCULAR | Status: AC
  Filled 2013-12-07: qty 5

## 2013-12-07 MED ORDER — POLYVINYL ALCOHOL 1.4 % OP SOLN
1.0000 [drp] | Freq: Three times a day (TID) | OPHTHALMIC | Status: DC | PRN
  Filled 2013-12-07: qty 15

## 2013-12-07 MED ORDER — HYDROCODONE-ACETAMINOPHEN 7.5-325 MG PO TABS
1.0000 | ORAL_TABLET | ORAL | Status: DC
  Administered 2013-12-07: 1 via ORAL
  Administered 2013-12-07 – 2013-12-09 (×10): 2 via ORAL
  Filled 2013-12-07 (×2): qty 2
  Filled 2013-12-07: qty 1
  Filled 2013-12-07 (×8): qty 2

## 2013-12-07 MED ORDER — EPHEDRINE SULFATE 50 MG/ML IJ SOLN
INTRAMUSCULAR | Status: DC | PRN
  Administered 2013-12-07: 5 mg via INTRAVENOUS

## 2013-12-07 MED ORDER — METHYLCELLULOSE 1 % OP SOLN: 1.0000 [drp] | Freq: Three times a day (TID) | OPHTHALMIC | Status: DC | PRN

## 2013-12-07 MED ORDER — HYDROMORPHONE HCL PF 1 MG/ML IJ SOLN
0.5000 mg | INTRAMUSCULAR | Status: DC | PRN
  Administered 2013-12-07: 1 mg via INTRAVENOUS
  Administered 2013-12-08: 2 mg via INTRAVENOUS
  Administered 2013-12-08: 1 mg via INTRAVENOUS
  Filled 2013-12-07: qty 2
  Filled 2013-12-07 (×2): qty 1

## 2013-12-07 MED ORDER — 0.9 % SODIUM CHLORIDE (POUR BTL) OPTIME
TOPICAL | Status: DC | PRN
  Administered 2013-12-07: 3000 mL

## 2013-12-07 MED ORDER — CEFAZOLIN SODIUM-DEXTROSE 2-3 GM-% IV SOLR
INTRAVENOUS | Status: AC
  Filled 2013-12-07: qty 50

## 2013-12-07 MED ORDER — MIDAZOLAM HCL 2 MG/2ML IJ SOLN
INTRAMUSCULAR | Status: AC
  Filled 2013-12-07: qty 2

## 2013-12-07 MED ORDER — CEFAZOLIN SODIUM-DEXTROSE 2-3 GM-% IV SOLR
2.0000 g | Freq: Four times a day (QID) | INTRAVENOUS | Status: AC
  Administered 2013-12-07 – 2013-12-08 (×2): 2 g via INTRAVENOUS
  Filled 2013-12-07 (×2): qty 50

## 2013-12-07 MED ORDER — FERROUS SULFATE 325 (65 FE) MG PO TABS
325.0000 mg | ORAL_TABLET | Freq: Three times a day (TID) | ORAL | Status: DC
  Administered 2013-12-08 – 2013-12-09 (×4): 325 mg via ORAL
  Filled 2013-12-07 (×8): qty 1

## 2013-12-07 MED ORDER — DIPHENHYDRAMINE HCL 25 MG PO CAPS: 25.0000 mg | ORAL_CAPSULE | Freq: Four times a day (QID) | ORAL | Status: DC | PRN

## 2013-12-07 MED ORDER — METHOCARBAMOL 100 MG/ML IJ SOLN
500.0000 mg | Freq: Four times a day (QID) | INTRAVENOUS | Status: DC | PRN
  Administered 2013-12-07: 500 mg via INTRAVENOUS
  Filled 2013-12-07 (×2): qty 5

## 2013-12-07 MED ORDER — NEOSTIGMINE METHYLSULFATE 1 MG/ML IJ SOLN
INTRAMUSCULAR | Status: DC | PRN
  Administered 2013-12-07: 5 mg via INTRAVENOUS

## 2013-12-07 MED ORDER — HYDROMORPHONE HCL PF 1 MG/ML IJ SOLN: 0.5000 mg | INTRAMUSCULAR | Status: DC | PRN

## 2013-12-07 MED ORDER — FENTANYL CITRATE 0.05 MG/ML IJ SOLN
INTRAMUSCULAR | Status: DC | PRN
  Administered 2013-12-07: 100 ug via INTRAVENOUS
  Administered 2013-12-07: 50 ug via INTRAVENOUS
  Administered 2013-12-07: 100 ug via INTRAVENOUS

## 2013-12-07 MED ORDER — HYDROMORPHONE HCL PF 1 MG/ML IJ SOLN
INTRAMUSCULAR | Status: DC | PRN
  Administered 2013-12-07: 1 mg via INTRAVENOUS

## 2013-12-07 MED ORDER — FUROSEMIDE 40 MG PO TABS
40.0000 mg | ORAL_TABLET | Freq: Every morning | ORAL | Status: DC
  Administered 2013-12-09: 40 mg via ORAL
  Filled 2013-12-07 (×2): qty 1

## 2013-12-07 MED ORDER — ACETAMINOPHEN 10 MG/ML IV SOLN
1000.0000 mg | Freq: Once | INTRAVENOUS | Status: AC
  Administered 2013-12-07: 1000 mg via INTRAVENOUS
  Filled 2013-12-07: qty 100

## 2013-12-07 MED ORDER — MIDAZOLAM HCL 5 MG/5ML IJ SOLN
INTRAMUSCULAR | Status: DC | PRN
  Administered 2013-12-07 (×2): 1 mg via INTRAVENOUS

## 2013-12-07 MED ORDER — GLYCOPYRROLATE 0.2 MG/ML IJ SOLN
INTRAMUSCULAR | Status: DC | PRN
  Administered 2013-12-07: 0.6 mg via INTRAVENOUS

## 2013-12-07 MED ORDER — LIDOCAINE HCL 1 % IJ SOLN
INTRAMUSCULAR | Status: DC | PRN
  Administered 2013-12-07: 20 mL

## 2013-12-07 MED ORDER — RIVAROXABAN 20 MG PO TABS
20.0000 mg | ORAL_TABLET | Freq: Every evening | ORAL | Status: DC
  Administered 2013-12-08 – 2013-12-09 (×2): 20 mg via ORAL
  Filled 2013-12-07 (×3): qty 1

## 2013-12-07 MED ORDER — MENTHOL 3 MG MT LOZG: 1.0000 | LOZENGE | OROMUCOSAL | Status: DC | PRN

## 2013-12-07 MED ORDER — PROMETHAZINE HCL 25 MG/ML IJ SOLN: 6.2500 mg | INTRAMUSCULAR | Status: DC | PRN

## 2013-12-07 MED ORDER — FENTANYL CITRATE 0.05 MG/ML IJ SOLN
INTRAMUSCULAR | Status: AC
  Filled 2013-12-07: qty 2

## 2013-12-07 MED ORDER — CISATRACURIUM BESYLATE 20 MG/10ML IV SOLN
INTRAVENOUS | Status: AC
  Filled 2013-12-07: qty 10

## 2013-12-07 SURGICAL SUPPLY — 56 items
BAG ZIPLOCK 12X15 (MISCELLANEOUS) ×3 IMPLANT
BLADE SAW SGTL 18X1.27X75 (BLADE) ×2 IMPLANT
BLADE SAW SGTL 18X1.27X75MM (BLADE) ×1
BRUSH FEMORAL CANAL (MISCELLANEOUS) IMPLANT
DERMABOND ADVANCED (GAUZE/BANDAGES/DRESSINGS) ×2
DERMABOND ADVANCED .7 DNX12 (GAUZE/BANDAGES/DRESSINGS) ×1 IMPLANT
DRAPE INCISE IOBAN 85X60 (DRAPES) ×3 IMPLANT
DRAPE ORTHO SPLIT 77X108 STRL (DRAPES) ×4
DRAPE POUCH INSTRU U-SHP 10X18 (DRAPES) ×3 IMPLANT
DRAPE SURG 17X11 SM STRL (DRAPES) ×3 IMPLANT
DRAPE SURG ORHT 6 SPLT 77X108 (DRAPES) ×2 IMPLANT
DRAPE U-SHAPE 47X51 STRL (DRAPES) ×3 IMPLANT
DRSG AQUACEL AG ADV 3.5X10 (GAUZE/BANDAGES/DRESSINGS) IMPLANT
DRSG AQUACEL AG ADV 3.5X14 (GAUZE/BANDAGES/DRESSINGS) ×3 IMPLANT
DRSG EMULSION OIL 3X16 NADH (GAUZE/BANDAGES/DRESSINGS) IMPLANT
DRSG MEPILEX BORDER 4X4 (GAUZE/BANDAGES/DRESSINGS) IMPLANT
DRSG MEPILEX BORDER 4X8 (GAUZE/BANDAGES/DRESSINGS) IMPLANT
DRSG TEGADERM 4X4.75 (GAUZE/BANDAGES/DRESSINGS) IMPLANT
DURAPREP 26ML APPLICATOR (WOUND CARE) ×6 IMPLANT
ELECT BLADE TIP CTD 4 INCH (ELECTRODE) ×3 IMPLANT
ELECT REM PT RETURN 9FT ADLT (ELECTROSURGICAL) ×3
ELECTRODE REM PT RTRN 9FT ADLT (ELECTROSURGICAL) ×1 IMPLANT
EVACUATOR 1/8 PVC DRAIN (DRAIN) ×3 IMPLANT
FACESHIELD LNG OPTICON STERILE (SAFETY) ×12 IMPLANT
GAUZE SPONGE 2X2 8PLY STRL LF (GAUZE/BANDAGES/DRESSINGS) ×1 IMPLANT
GLOVE BIOGEL PI IND STRL 7.5 (GLOVE) ×1 IMPLANT
GLOVE BIOGEL PI IND STRL 8 (GLOVE) ×1 IMPLANT
GLOVE BIOGEL PI INDICATOR 7.5 (GLOVE) ×2
GLOVE BIOGEL PI INDICATOR 8 (GLOVE) ×2
GLOVE ECLIPSE 8.0 STRL XLNG CF (GLOVE) ×6 IMPLANT
GOWN SPEC L3 XXLG W/TWL (GOWN DISPOSABLE) ×6 IMPLANT
GOWN STRL REUS W/TWL LRG LVL3 (GOWN DISPOSABLE) ×3 IMPLANT
HANDPIECE INTERPULSE COAX TIP (DISPOSABLE)
HEAD FEM STD 32X+9 STRL (Hips) ×3 IMPLANT
KIT BASIN OR (CUSTOM PROCEDURE TRAY) ×3 IMPLANT
LINER CONTRUCT 32-54 4/10 (Liner) ×3 IMPLANT
MANIFOLD NEPTUNE II (INSTRUMENTS) ×3 IMPLANT
NS IRRIG 1000ML POUR BTL (IV SOLUTION) ×6 IMPLANT
PACK TOTAL JOINT (CUSTOM PROCEDURE TRAY) ×3 IMPLANT
POSITIONER SURGICAL ARM (MISCELLANEOUS) ×3 IMPLANT
PRESSURIZER FEMORAL UNIV (MISCELLANEOUS) IMPLANT
SET HNDPC FAN SPRY TIP SCT (DISPOSABLE) IMPLANT
SPONGE GAUZE 2X2 STER 10/PKG (GAUZE/BANDAGES/DRESSINGS) ×2
SPONGE LAP 18X18 X RAY DECT (DISPOSABLE) ×6 IMPLANT
SPONGE LAP 4X18 X RAY DECT (DISPOSABLE) IMPLANT
STAPLER VISISTAT 35W (STAPLE) ×3 IMPLANT
SUCTION FRAZIER TIP 10 FR DISP (SUCTIONS) ×3 IMPLANT
SUT MNCRL AB 3-0 PS2 18 (SUTURE) ×3 IMPLANT
SUT VIC AB 1 CT1 36 (SUTURE) ×6 IMPLANT
SUT VIC AB 2-0 CT1 27 (SUTURE) ×4
SUT VIC AB 2-0 CT1 TAPERPNT 27 (SUTURE) ×2 IMPLANT
SUT VLOC 180 0 24IN GS25 (SUTURE) ×3 IMPLANT
TOWEL OR 17X26 10 PK STRL BLUE (TOWEL DISPOSABLE) ×6 IMPLANT
TOWER CARTRIDGE SMART MIX (DISPOSABLE) IMPLANT
TRAY FOLEY CATH 14FRSI W/METER (CATHETERS) ×3 IMPLANT
WATER STERILE IRR 1500ML POUR (IV SOLUTION) IMPLANT

## 2013-12-07 NOTE — Transfer of Care (Signed)
Immediate Anesthesia Transfer of Care Note  Patient: [name]  Procedure(s) Performed: Procedure(s):  REVISION  CONSTRAINED LINER LEFT TOTAL HIP  (Left)  Patient Location: PACU  Anesthesia Type:General  Level of Consciousness: awake and alert   Airway & Oxygen Therapy: Patient Spontanous Breathing and Patient connected to face mask oxygen  Post-op Assessment: Report given to PACU RN and Post -op Vital signs reviewed and stable  Post vital signs: Reviewed and stable  Complications: No apparent anesthesia complications

## 2013-12-07 NOTE — Brief Op Note (Signed)
12/07/2013  4:08 PM  PATIENT:  [name]  68 y.o. female  PRE-OPERATIVE DIAGNOSIS:  1.  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  Right thumb MCP arthritis  PROCEDURE:  Procedure(s): 1.  REVISION  CONSTRAINED LINER LEFT TOTAL HIP  (Left) 2.  Right thumb intra-articular injection, kenalog/lidocaine  Depuy 54mm, 32+4 10 degree liner, 32+9 head ball  SURGEON:  Surgeon(s) and Role:    * Shelda PalMatthew D Tacora Athanas, MD - Primary  PHYSICIAN ASSISTANT: Lanney GinsMatthew Babish, PA-C  ANESTHESIA:   general  EBL:  Total I/O In: 1000 [I.V.:1000] Out: 400 [Blood:400]  BLOOD ADMINISTERED:none  DRAINS: none   LOCAL MEDICATIONS USED:  NONE  SPECIMEN:  No Specimen  DISPOSITION OF SPECIMEN:  N/A  COUNTS:  YES  TOURNIQUET:  * No tourniquets in log *  DICTATION: .Other Dictation: Dictation Number [id number]  PLAN OF CARE: Admit to inpatient   PATIENT DISPOSITION:  PACU - hemodynamically stable.   Delay start of Pharmacological VTE agent (>24hrs) due to surgical blood loss or risk of bleeding: no

## 2013-12-07 NOTE — Anesthesia Procedure Notes (Signed)
Procedure Name: Intubation Date/Time: 12/07/2013 2:02 PM Performed by: Edison PaceGRAY, Zabdiel Dripps E Pre-anesthesia Checklist: Emergency Drugs available, Timeout performed, Suction available, Patient identified and Patient being monitored Patient Re-evaluated:Patient Re-evaluated prior to inductionOxygen Delivery Method: Circle system utilized Preoxygenation: Pre-oxygenation with 100% oxygen Intubation Type: IV induction and Cricoid Pressure applied Ventilation: Mask ventilation without difficulty Laryngoscope Size: Mac and 4 Grade View: Grade I Tube type: Oral Tube size: 7.5 mm Number of attempts: 1 Airway Equipment and Method: Stylet Placement Confirmation: ETT inserted through vocal cords under direct vision,  positive ETCO2 and breath sounds checked- equal and bilateral Secured at: 21 cm Tube secured with: Tape Dental Injury: Teeth and Oropharynx as per pre-operative assessment

## 2013-12-07 NOTE — Progress Notes (Signed)
Utilization review completed.  

## 2013-12-07 NOTE — Anesthesia Postprocedure Evaluation (Signed)
  Anesthesia Post-op Note  Patient: [name]  Procedure(s) Performed: Procedure(s) (LRB):  REVISION  CONSTRAINED LINER LEFT TOTAL HIP  (Left)  Patient Location: PACU  Anesthesia Type: General  Level of Consciousness: awake and alert   Airway and Oxygen Therapy: Patient Spontanous Breathing  Post-op Pain: mild  Post-op Assessment: Post-op Vital signs reviewed, Patient's Cardiovascular Status Stable, Respiratory Function Stable, Patent Airway and No signs of Nausea or vomiting  Last Vitals:  Filed Vitals:   12/07/13 1815  BP: 141/54  Pulse: 66  Temp:   Resp: 18    Post-op Vital Signs: stable   Complications: No apparent anesthesia complications

## 2013-12-07 NOTE — Progress Notes (Signed)
Unable to place TEDS on pt as she measures c=25, Length =21  We do not have this size as our largest is c=20.  SCD was given

## 2013-12-07 NOTE — Interval H&P Note (Signed)
History and Physical Interval Note:  12/07/2013 1:46 PM  [name]  has presented today for surgery, with the diagnosis of STATUS POST REVISION TOTAL HIP ARTHRO LEFT WITH SLIP OF CONSTRAINED LINER  The various methods of treatment have been discussed with the patient and family. After consideration of risks, benefits and other options for treatment, the patient has consented to  Procedure(s):  REVISION  CONSTRAINED LINER LEFT TOTAL HIP  (Left) as a surgical intervention .  The patient's history has been reviewed, patient examined, no change in status, stable for surgery.  I have reviewed the patient's chart and labs.  Questions were answered to the patient's satisfaction.     Shelda PalLIN,[name]

## 2013-12-07 NOTE — Anesthesia Preprocedure Evaluation (Addendum)
Anesthesia Evaluation  Patient identified by MRN, date of birth, ID band Patient awake    Reviewed: Allergy & Precautions, H&P , NPO status , Patient's Chart, lab work & pertinent test results  History of Anesthesia Complications (+) PONV, PROLONGED EMERGENCE and history of anesthetic complications  Airway Mallampati: II TM Distance: >3 FB Neck ROM: Full    Dental no notable dental hx.    Pulmonary neg pulmonary ROS,  breath sounds clear to auscultation  Pulmonary exam normal       Cardiovascular hypertension, Pt. on medications and Pt. on home beta blockers + Peripheral Vascular Disease negative cardio ROS  + Valvular Problems/Murmurs Rhythm:Regular Rate:Normal     Neuro/Psych negative neurological ROS  negative psych ROS   GI/Hepatic Neg liver ROS, GERD-  Medicated,  Endo/Other  Hypothyroidism Morbid obesity  Renal/GU negative Renal ROS  negative genitourinary   Musculoskeletal negative musculoskeletal ROS (+)   Abdominal (+) + obese,   Peds negative pediatric ROS (+)  Hematology  (+) anemia ,   Anesthesia Other Findings   Reproductive/Obstetrics negative OB ROS                          Anesthesia Physical Anesthesia Plan  ASA: III  Anesthesia Plan: General   Post-op Pain Management:    Induction: Intravenous  Airway Management Planned: Oral ETT  Additional Equipment:   Intra-op Plan:   Post-operative Plan: Extubation in OR  Informed Consent: I have reviewed the patients History and Physical, chart, labs and discussed the procedure including the risks, benefits and alternatives for the proposed anesthesia with the patient or authorized representative who has indicated his/her understanding and acceptance.   Dental advisory given  Plan Discussed with: CRNA  Anesthesia Plan Comments: (Back surgery with hardware. Discussed r/b general versus spinal. Plan general.)        Anesthesia Quick Evaluation

## 2013-12-08 LAB — BASIC METABOLIC PANEL
BUN: 18 mg/dL (ref 6–23)
CHLORIDE: 101 meq/L (ref 96–112)
CO2: 26 meq/L (ref 19–32)
Calcium: 9.2 mg/dL (ref 8.4–10.5)
Creatinine, Ser: 0.74 mg/dL (ref 0.50–1.10)
GFR calc Af Amer: >90 mL/min (ref >90)
GFR, EST NON AFRICAN AMERICAN: 86 mL/min — AB (ref >90)
GLUCOSE: 132 mg/dL — AB (ref 70–99)
POTASSIUM: 4.9 meq/L (ref 3.7–5.3)
Sodium: 138 mEq/L (ref 137–147)

## 2013-12-08 LAB — CBC
HEMATOCRIT: 32.3 % — AB (ref 36.0–46.0)
HEMOGLOBIN: 9.9 g/dL — AB (ref 12.0–15.0)
MCH: 25.1 pg — ABNORMAL LOW (ref 26.0–34.0)
MCHC: 30.7 g/dL (ref 30.0–36.0)
MCV: 81.8 fL (ref 78.0–100.0)
Platelets: 233 10*3/uL (ref 150–400)
RBC: 3.95 MIL/uL (ref 3.87–5.11)
RDW: 14.7 % (ref 11.5–15.5)
WBC: 9.2 10*3/uL (ref 4.0–10.5)

## 2013-12-08 MED ORDER — GABAPENTIN 300 MG PO CAPS
600.0000 mg | ORAL_CAPSULE | Freq: Three times a day (TID) | ORAL | Status: DC
  Administered 2013-12-08 – 2013-12-09 (×4): 600 mg via ORAL
  Filled 2013-12-08 (×6): qty 2

## 2013-12-08 NOTE — Progress Notes (Signed)
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  Pt mobilizing well despite pain and hopes to d/c home tomorrow.  Reviewed posterior hip precautions and pt provided with handout.  Ice packs applied to L hip end of session.  Precautions  Precautions Posterior Hip;Fall  Precaution Comments pt reports hx of falls  Restrictions  LLE Weight Bearing WBAT  Cognition  Arousal/Alertness Awake/alert  Behavior During Therapy WFL for tasks assessed/performed  Overall Cognitive Status Within Functional Limits for tasks assessed  Transfers  Overall transfer level Needs assistance  Equipment used Rolling walker (2 wheeled)  Transfers Sit to/from Stand  Sit to Stand Min guard  General transfer comment verbal cues for safe technique within precautions  Ambulation/Gait  Ambulation/Gait assistance Min guard  Ambulation Distance (Feet) 140 Feet  Assistive device Rolling walker (2 wheeled)  Gait Pattern/deviations Step-to pattern  General Gait Details pt able to recall correct sequence and turning toward unaffected LE, reports 8/10 L hip pain however attempting to perform as much distance as tolerated  PT - End of Session  Activity Tolerance Patient limited by pain  Patient left in chair;with call bell/phone within reach;with family/visitor present  PT - Assessment/Plan  PT Plan Current plan remains appropriate  PT Frequency 7X/week  Follow Up Recommendations Home health PT  PT equipment None recommended by PT  PT Goal Progression  Progress towards PT goals Progressing toward goals  PT  General Charges  $$ ACUTE PT VISIT 1 Procedure  PT Treatments  $Gait Training 8-22 mins   Zenovia JarredKati Genora Arp, PT, DPT 12/08/2013 Pager: [phone]

## 2013-12-08 NOTE — Evaluation (Signed)
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  Patient will benefit from skilled PT to increase their independence and safety with mobility to allow discharge to the venue listed below.  Pt educated on posterior hip precautions and reviewed precautions with mobility as well as pt only able to recall 2/3 from previous surgeries.  Pt able to tolerate ambulation and exercises today and very motivated to return home with spouse.     PT Assessment  Patient needs continued PT services    Follow Up Recommendations  Home health PT    Does the patient have the potential to tolerate intense rehabilitation      Barriers to Discharge        Equipment Recommendations  None recommended by PT    Recommendations for Other Services     Frequency 7X/week    Precautions / Restrictions Precautions Precautions: Posterior Hip;Fall Precaution Comments: pt reports hx of falls Restrictions Weight Bearing Restrictions: Yes LLE Weight Bearing: Weight bearing as tolerated   Pertinent Vitals/Pain Pt reports L hip pain however performed activities to tolerance, used call bell to notify RN of request for more pain meds, repositioned      Mobility  Bed Mobility Overal bed mobility: Needs Assistance Bed Mobility: Supine to Sit Supine to sit: Min assist;HOB elevated General bed mobility comments: verbal cues for technique and maintaining precautions, provided assist for L LE Transfers Overall transfer level: Needs assistance Equipment used: Rolling walker (2  wheeled) Transfers: Sit to/from Stand Sit to Stand: Min guard General transfer comment: verbal cues for safe technique within precautions Ambulation/Gait Ambulation/Gait assistance: Min guard Ambulation Distance (Feet): 160 Feet Assistive device: Rolling walker (2 wheeled) Gait Pattern/deviations: Step-to pattern General Gait Details: increased lateral lean to L (pt reports her usual due to LLDs from multiple surgeries), verbal cues for sequence    Exercises Total Joint Exercises Ankle Circles/Pumps: AROM;Both;20 reps Quad Sets: AROM;Both;15 reps Short Arc Quad: AROM;Left;15 reps Heel Slides: AAROM;Left;15 reps Hip ABduction/ADduction: AAROM;Left;15 reps Long Arc Quad: AROM;Seated;Left;10 reps   PT Diagnosis: Difficulty walking;Acute pain  PT Problem List: Decreased strength;Decreased activity tolerance;Decreased mobility;Decreased knowledge of precautions;Pain;Obesity PT Treatment Interventions: DME instruction;Gait training;Stair training;Functional mobility training;Therapeutic activities;Therapeutic exercise;Patient/family education     PT Goals(Current goals can be found in the care plan section) Acute Rehab PT Goals Patient Stated Goal: no more hip surgeries PT Goal Formulation: With patient Time For Goal Achievement: 12/12/13 Potential to Achieve Goals: Good  Visit Information  Last PT Received On: 12/08/13 Assistance Needed: +1 History of Present Illness: 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 liner, posterior hip precautions       Prior Functioning  Home Living Family/patient expects to be discharged to:: Private residence Living Arrangements: Spouse/significant other Available Help at Discharge: Family;Available 24 hours/day Type of Home: House Home Access: Stairs to enter Entergy CorporationEntrance Stairs-Number of Steps: 2 Entrance Stairs-Rails: Right Home Layout: One level Home Equipment: Walker - 2 wheels;Cane - single point;Tub  bench;Bedside commode;Other (comment) (reacher, leg lifter) Prior Function Level of Independence: Independent with assistive device(s) Comments: Pt states she has been using RW 2 weeks prior  to surgery however normally uses [facility] Communication Communication: No difficulties Dominant Hand: Right    Cognition  Cognition Arousal/Alertness: Awake/alert Behavior During Therapy: WFL for tasks assessed/performed Overall Cognitive Status: Within Functional Limits for tasks assessed    Extremity/Trunk Assessment Upper Extremity Assessment Upper Extremity Assessment: Overall WFL for tasks assessed Lower Extremity Assessment Lower Extremity Assessment: LLE deficits/detail LLE Deficits / Details: functional hip weakness observed   Balance    End of Session PT - End of Session Activity Tolerance: Patient tolerated treatment well Patient left: in chair;with call bell/phone within reach;with family/visitor present Nurse Communication: Patient requests pain meds (by use of call bell)  GP     Leilana Mcquire,KATHrine E 12/08/2013, 12:47 PM Zenovia Jarred, PT, DPT 12/08/2013 Pager: [phone]

## 2013-12-08 NOTE — Op Note (Signed)
[name], [name] NO.:  [id number]  MEDICAL RECORD NO.:  [id number]  LOCATION:  1601                         FACILITY:  [facility]  PHYSICIAN:  Madlyn Frankel. Charlann Boxer, M.D.  DATE OF BIRTH:  12/14/1945  DATE OF PROCEDURE:  12/07/2013 DATE OF DISCHARGE:                              OPERATIVE REPORT   PREOPERATIVE DIAGNOSES: 1. A failed left total hip arthroplasty with failure of her     constrained liner with hip subluxation. 2. Right thumb metacarpophalangeal arthritis.  POSTOPERATIVE DIAGNOSES: 1. A failed left total hip arthroplasty with failure of her     constrained liner with hip subluxation. 2. Right thumb metacarpophalangeal arthritis.  FINDINGS:  Patient was noted to have a broken acetabular liner with displaced constrained ring and consistent with her sensation of subluxation of the hip.  PROCEDURE: 1. Revision of left total hip arthroplasty with a constrained liner     using the DePuy size 54 x 32, +4, 10 degree constrained liner, and     a new 32 +9  Articul/EZE metal ball. 2. Intraarticular injection of the right thumb MCP joint with Kenalog     and lidocaine.  SURGEON:  Madlyn Frankel. Charlann Boxer, M.D.  ASSISTANT:  Lanney Gins, PA-C.  Note that, Mr. [name] was present for the entirety of the case for preoperative position, perioperative management of the operative extremity, general facilitation of the case, and primary wound closure.  ANESTHESIA:  General.  SPECIMENS:  None.  Her removed components were sent to the processing center to be cleaned for the patient.  DRAINS:  None.  BLOOD LOSS:  About 400 mL.  COMPLICATIONS:  None apparent.  INDICATIONS FOR PROCEDURE:  [name] is a pleasant 68 year old female, who has been a patient of mine for some time.  She has had an index left total hip arthroplasty some years ago with a DePuy metal ASR hip component.  Based on metal concerns she was subsequently revised to a pinnacle shell with  polyethylene liner.  Based on numerous comorbid factors, she had multiple episodes of dislocation.  Upon evaluation of her components and the stability of them, I made the decision to treat her with a constrained liner due to concerns of recurrent instability.  This was despite purposely lengthening her left lower extremity for increased instability.  She had been doing well until recently getting a sense of hip subluxation associated with pain and most recently within the last 2 weeks,  she had a sharp pain when she bent over to the left side of feeling a sharp pop with increased pain, since that time, she has had increased pain.  She had contacted our office.  She was seen and evaluated and radiographically we noted that the constrained liner ring had popped off of the liner.  With this, she had some recurrent instability sensation.  I reviewed with her the necessity of proceeding in the fashion a revision of her constrained liner.  Consent was obtained.  After reviewing risks of infection, DVT, as well as recurrence of complication related to her constrained liner.  Indication for her right thumb injection indicates a history of right thumb MCP joint arthritis for which we  done multiple injections in the office.  She wished to have it injected while she was in the operating room.  DESCRIPTION OF PROCEDURE:  The patient was brought to operative theater. Once adequate anesthesia, preoperative antibiotics, Ancef 2 g administered.  She was positioned into the right lateral decubitus position with the left side up.  The left lower extremity was then prepped and draped in sterile fashion.  Time-out was performed identifying the patient, planned procedure, and extremity, initially starting with this left hip.  Her old incision was identified and soft tissue dissection was carried down to the iliotibial band and gluteal fascia was incised for posterior approach to the hip.  The  posterior aspect of the hip was exposed encountering a synovial pocket of clear synovial fluid, but the metal stained most likely from previous surgical intervention.  The posterior 2/3 of the acetabulum was exposed.  Once this was done, it was readily evident that the acetabular ring had popped off the liner.  The hip was then dislocated and the femoral head was removed.  I cleared away soft tissues off the ilium and placed the trunnion onto the ilium with retractors placed for further expose of the acetabulum.  With acetabular exposed at this point, we attempted to remove the acetabular liner, which proved to be more challenging than anticipated, multiple attempts with usually proven techniques failed to remove the liner easily, and I had to use osteotomes and ultimately I was able to disengage the liner from the cup, this proves inherent acetabular cup stability within the pelvis.  At this point, with the acetabular liner removed, I irrigated the hip with normal saline solution.  We selected a 54 liner with a +4, 10 degree lip with a constrained for a 32 ball.  It was then positioned with the lip portion positioned at approximate 2 o'clock of the left hip.  It was then impacted with good visualized secure rim fit on the acetabular shell.  At this point, we placed the constrained liner ring around the trunnion with the bevel facing towards the cup and a 32, +9 ball which we removed was then selected and impacted onto a clean and dry trunnion and reduced into the constrained liner.  The beveled ring was then placed over top of the liner and impacted in position using bone tamps at the opposite ends of the cup.  Once this was done, the hip was re-irrigated with normal saline solution with a pulse lavage.  After further debridement of some of the stained synovial lining, I reapproximated the iliotibial band and gluteal fascia with a combination of #1 Vicryl and running V-Loc suture.  The  remainder of wound was closed with 2-0 Vicryl and a running 3-0 Monocryl.  The hip was then cleaned, dried, and dressed sterilely using Dermabond, 14 inch Aquacel dressing.  Following this procedure and dressing of the left hip, she was positioned supine on her hospital bed.  Once positioned accordingly, we cleansed her right thumb with an alcohol swab and then injected the MCP joint with 1 mL of Kenalog equaling 40 mg and 1 mL of lidocaine.  She was then awoken from anesthesia, extubated, and brought to the recovery room in stable condition.  Findings reviewed with her husband.  I will make her weightbearing as tolerated.  She will be discharged from the hospital either on Saturday or Sunday.     Madlyn FrankelMatthew D. Charlann Boxerlin, M.D.     MDO/MEDQ  D:  12/07/2013  T:  12/08/2013  Job:  [id number]

## 2013-12-08 NOTE — Progress Notes (Signed)
Occupational Therapy Evaluation Patient Details Name: [name] MRN: [id number] DOB: [birth year] Today's Date: 12/08/2013 Time: 4098-11911205-1213 OT Time Calculation (min): 8 min  OT Assessment / Plan / Recommendation History of present illness s/p L THA revision with posterior hip precautions, WBAT   Clinical Impression   Pt presents to OT with decreased ADL independence and safety due to hip precautions, decreased mobility. Will benefit from OT to maximize independence prior to d/c home with spouse. Patient has all needed DME.    OT Assessment  Patient needs continued OT Services    Follow Up Recommendations  No OT follow up;Supervision/Assistance - 24 hour    Barriers to Discharge      Equipment Recommendations  Other (comment) (pt has all needed equipment)    Recommendations for Other Services    Frequency  Min 2X/week    Precautions / Restrictions Precautions Precautions: Posterior Hip Restrictions Weight Bearing Restrictions: Yes LLE Weight Bearing: Weight bearing as tolerated   Pertinent Vitals/Pain C/o pain, nurse in to give pain medication    ADL  Eating/Feeding: Performed;Independent Where Assessed - Eating/Feeding: Chair Lower Body Bathing: Simulated;Moderate assistance Where Assessed - Lower Body Bathing: Supported sitting Lower Body Dressing: Simulated;Maximal assistance Where Assessed - Lower Body Dressing: Supported sitting Transfers/Ambulation Related to ADLs: Pt declined on eval 2/2 just worked with PT and fatigued. Wants to practice toilet transfer next visit. Will sponge bathe for now as they are in process of converting tub/shower to walk in shower. ADL Comments: Reviewed posterior hip precautions with pt. She has reacher and leg lifter at home.    OT Diagnosis: Acute pain  OT Problem List: Pain;Decreased knowledge of precautions;Decreased knowledge of use of DME or AE OT Treatment Interventions: Self-care/ADL training;DME and/or AE  instruction;Therapeutic activities;Patient/family education   OT Goals(Current goals can be found in the care plan section) Acute Rehab OT Goals Patient Stated Goal: no more hip surgeries OT Goal Formulation: With patient Time For Goal Achievement: 12/22/13 Potential to Achieve Goals: Good  Visit Information  Last OT Received On: 12/08/13 Assistance Needed: +1 History of Present Illness: s/p L THA revision with posterior hip precautions, WBAT       Prior Functioning     Home Living Family/patient expects to be discharged to:: Private residence Living Arrangements: Spouse/significant other Available Help at Discharge: Family;Available 24 hours/day Home Equipment: Walker - 2 wheels;Cane - single point;Tub bench;Bedside commode;Other (comment) (reacher, leg lifter) Prior Function Level of Independence: Independent with assistive device(s) Communication Communication: No difficulties Dominant Hand: Right         Vision/Perception Vision - History Baseline Vision: Wears glasses all the time   Cognition  Cognition Arousal/Alertness: Awake/alert Behavior During Therapy: WFL for tasks assessed/performed Overall Cognitive Status: Within Functional Limits for tasks assessed    Extremity/Trunk Assessment Upper Extremity Assessment Upper Extremity Assessment: Overall WFL for tasks assessed Lower Extremity Assessment Lower Extremity Assessment: Defer to PT evaluation     End of Session OT - End of Session Activity Tolerance: Patient tolerated treatment well Patient left: in chair;with call bell/phone within reach;with family/visitor present  GO     Mikaeel Petrow A 12/08/2013, 12:26 PM

## 2013-12-08 NOTE — Progress Notes (Signed)
Patient ID: [name], female   DOB: 1946-03-18, 68 y.o.   MRN: [id number] Subjective: 1 Day Post-Op Procedure(s) (LRB):  REVISION  CONSTRAINED LINER LEFT TOTAL HIP  (Left)    Patient reports pain as moderate complaining of burning pain in the left hip incisional area.  Not a lot of activity thus far.  Has complained of mid thigh pain for a while  Objective:   VITALS:   Filed Vitals:   12/08/13 0135  BP: 90/56  Pulse: 80  Temp: 98.4 F (36.9 C)  Resp: 16    Neurovascular intact Incision: dressing C/D/I  LABS  Recent Labs  12/08/13 0444  HGB 9.9*  HCT 32.3*  WBC 9.2  PLT 233     Recent Labs  12/08/13 0444  NA 138  K 4.9  BUN 18  CREATININE 0.74  GLUCOSE 132*    No results found for this basename: LABPT, INR,  in the last 72 hours   Assessment/Plan: 1 Day Post-Op Procedure(s) (LRB):  REVISION  CONSTRAINED LINER LEFT TOTAL HIP  (Left)   Advance diet Up with therapy Plan for discharge tomorrow Discharge home with home health  Review of X-rays from post-op yesterday reveal stable components without bone abnormalities or concerns

## 2013-12-09 LAB — BASIC METABOLIC PANEL
BUN: 18 mg/dL (ref 6–23)
CALCIUM: 9.4 mg/dL (ref 8.4–10.5)
CO2: 24 mEq/L (ref 19–32)
CREATININE: 0.69 mg/dL (ref 0.50–1.10)
Chloride: 99 mEq/L (ref 96–112)
GFR calc Af Amer: >90 mL/min (ref >90)
GFR, EST NON AFRICAN AMERICAN: 88 mL/min — AB (ref >90)
Glucose, Bld: 147 mg/dL — ABNORMAL HIGH (ref 70–99)
Potassium: 4.9 mEq/L (ref 3.7–5.3)
Sodium: 134 mEq/L — ABNORMAL LOW (ref 137–147)

## 2013-12-09 LAB — CBC
HEMATOCRIT: 30.7 % — AB (ref 36.0–46.0)
HEMOGLOBIN: 9.7 g/dL — AB (ref 12.0–15.0)
MCH: 25.7 pg — AB (ref 26.0–34.0)
MCHC: 31.6 g/dL (ref 30.0–36.0)
MCV: 81.4 fL (ref 78.0–100.0)
Platelets: 216 10*3/uL (ref 150–400)
RBC: 3.77 MIL/uL — AB (ref 3.87–5.11)
RDW: 15 % (ref 11.5–15.5)
WBC: 8.9 10*3/uL (ref 4.0–10.5)

## 2013-12-09 MED ORDER — RIVAROXABAN 20 MG PO TABS
20.0000 mg | ORAL_TABLET | Freq: Every evening | ORAL | Status: DC
Start: 2013-12-09 — End: 2014-05-16

## 2013-12-09 NOTE — Progress Notes (Signed)
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 Patient's husband will A with LB bathing and dressing.  Follow Up Recommendations  No OT follow up;Supervision/Assistance - 24 hour    Frequency Min 2X/week   Progress towards OT Goals Progress towards OT goals: Goals met/education completed, patient discharged from OT  Plan Discharge plan remains appropriate    Precautions / Restrictions Precautions Precautions: Posterior Hip;Fall Restrictions Weight Bearing Restrictions: No LLE Weight Bearing: Weight bearing as tolerated   Pertinent Vitals/Pain No c/o pain    ADL  Transfers/Ambulation Related to ADLs: Patient states she has been up to bathroom with nursing staff using BSC without difficulty. Declines to practice. Will sponge bathe at home.  ADL Comments: Reviewed posterior hip precautions with pt and LB dressing techniques with reacher. Husband will A with socks and shoes. Patient declined to practice.    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  

## 2013-12-09 NOTE — Progress Notes (Signed)
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  Pt OOB in recliner feeling good.  Amb in hallway.  Performed one step forward with RW 3 times then returned to recliner to perform THR TE's.  Pt verbally stated 3/3 THP.  Pt eager to D/C to home today "if the doctor will let me go".   Follow Up Recommendations  Home health PT     Does the patient have the potential to tolerate intense rehabilitation     Barriers to Discharge        Equipment Recommendations       Recommendations for Other Services    Frequency     Progress towards PT Goals Progress towards PT goals: Progressing toward goals  Plan      Precautions / Restrictions Precautions Precautions: Posterior Hip;Fall Precaution Comments: pt reports hx of falls Restrictions Weight Bearing Restrictions: No LLE Weight Bearing: Weight bearing as tolerated   Pertinent Vitals/Pain C/o "soreness" ICE applied    Mobility  Bed Mobility General bed mobility comments: Pt OOB in recliner Transfers Overall transfer level: Needs assistance Equipment used: Rolling walker (2 wheeled) Transfers: Sit to/from Stand Sit to Stand: Supervision;Min guard General transfer comment: verbal cues for safe technique within precautions plus increased time Ambulation/Gait Ambulation/Gait assistance: Supervision;Min guard Ambulation Distance (Feet): 155 Feet Assistive device: Rolling walker (2 wheeled) Gait Pattern/deviations: Step-to pattern;Decreased stance time - left Gait velocity: decreased General Gait Details: increased time and <25% VC's on safety with turns Stairs: Yes Stairs assistance: Min guard Stair  Management: No rails;Forwards;With walker Number of Stairs: 1 General stair comments: one step forward with RW 25% VC's on proper sequencing and walker placement.  Performed 3 times.     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 liner, posterior hip precautions    Subjective Data      Cognition  Cognition Arousal/Alertness: Awake/alert Behavior During Therapy: WFL for tasks assessed/performed Overall Cognitive Status: Within Functional Limits for tasks assessed    Balance     End of Session PT - End of Session Equipment Utilized During Treatment: Gait belt Activity Tolerance: Patient tolerated treatment well Patient left: in chair;with call bell/phone within reach Nurse Communication:  (Pt ready for D/C to home)   Felecia ShellingLori Lasonia Casino  PTA [facility]  Acute  Rehab Pager      [phone]

## 2013-12-09 NOTE — Progress Notes (Signed)
   CARE MANAGEMENT NOTE 12/09/2013  Patient:  [name],[name]   Account Number:  [id number]  Date Initiated:  12/09/2013  Documentation initiated by:  [facility]Aime Meloche  Subjective/Objective Assessment:   REVISION  CONSTRAINED LINER LEFT TOTAL HIP  (Left)     Action/Plan:   HH   Anticipated DC Date:  12/10/2013   Anticipated DC Plan:  HOME W HOME HEALTH SERVICES      DC Planning Services  CM consult      [facility]   Choice offered to / List presented to:  C-1 Patient        HH arranged  HH-2 PT      [facility]   Status of service:  Completed, signed off Medicare Important Message given?   (If response is "NO", the following Medicare IM given date fields will be blank) Date Medicare IM given:   Date Additional Medicare IM given:    Discharge Disposition:  HOME W HOME HEALTH SERVICES  Per UR Regulation:    If discussed at Long Length of Stay Meetings, dates discussed:    Comments:  12/09/2013 1000 Spoke to pt and [facility] Pt states no DME is needes. She has RW, 3n1, and shower chair. Isidoro DonningAlesia Brittny Spangle RN CCM Case Mgmt phone [phone]

## 2013-12-09 NOTE — Progress Notes (Signed)
   CARE MANAGEMENT NOTE 12/09/2013  Patient:  [name],[name]   Account Number:  [id number]  Date Initiated:  12/09/2013  Documentation initiated by:  [facility]Maryetta Shafer  Subjective/Objective Assessment:   REVISION  CONSTRAINED LINER LEFT TOTAL HIP  (Left)     Action/Plan:   HH   Anticipated DC Date:  12/10/2013   Anticipated DC Plan:  HOME W HOME HEALTH SERVICES      DC Planning Services  CM consult      [facility]   Choice offered to / List presented to:  C-1 Patient        HH arranged  HH-2 PT      [facility]   Status of service:  Completed, signed off Medicare Important Message given?   (If response is "NO", the following Medicare IM given date fields will be blank) Date Medicare IM given:   Date Additional Medicare IM given:    Discharge Disposition:  HOME W HOME HEALTH SERVICES  Per UR Regulation:    If discussed at Long Length of Stay Meetings, dates discussed:    Comments:  12/09/2013 1300 [facility] and [facility] [employer]. [facility] to pt and [facility]. Isidoro Donninglesia Daelon Dunivan RN CCM Case Mgmt phone [phone]  12/09/2013 1000 Spoke to pt and [facility]. Pt states no DME is needes. She has RW, 3n1, and shower chair. Isidoro DonningAlesia Matthe Sloane RN CCM Case Mgmt phone [phone]

## 2013-12-09 NOTE — Progress Notes (Signed)
Subjective: 2 Days Post-Op Procedure(s) (LRB):  REVISION  CONSTRAINED LINER LEFT TOTAL HIP  (Left) Patient doing well this AM, pain well controlled, progressing well with physical therapy.  C/o mild nausea intermittently throughout the evening. Pt denies N/V/F/C, chest pain, SOB, calf pain, or paresthesia b/l.  Objective: Vital signs in last 24 hours: Temp:  [97.3 F (36.3 C)-98.5 F (36.9 C)] 97.3 F (36.3 C) (02/22 0815) Pulse Rate:  [60-74] 69 (02/22 0815) Resp:  [16-18] 16 (02/22 0815) BP: (118-174)/(63-74) 174/70 mmHg (02/22 0815) SpO2:  [94 %-95 %] 94 % (02/22 0815)  Intake/Output from previous day: 02/21 0701 - 02/22 0700 In: 2380 [P.O.:1180; I.V.:1200] Out: 1700 [Urine:1700] Intake/Output this shift: Total I/O In: 400 [P.O.:400] Out: -    Recent Labs  12/08/13 0444 12/09/13 0438  HGB 9.9* 9.7*    Recent Labs  12/08/13 0444 12/09/13 0438  WBC 9.2 8.9  RBC 3.95 3.77*  HCT 32.3* 30.7*  PLT 233 216    Recent Labs  12/08/13 0444 12/09/13 0438  NA 138 134*  K 4.9 4.9  CL 101 99  CO2 26 24  BUN 18 18  CREATININE 0.74 0.69  GLUCOSE 132* 147*  CALCIUM 9.2 9.4   No results found for this basename: LABPT, INR,  in the last 72 hours  Incision C/D/I, well approximated with no dehiscence noted.  No erythema/drainage noted.  Normal amounts of post op edema.  Compartments soft, negative Homan's sign.  Lower extremtities are NVI with good motor control of plantar/dorsiflexion of great toes and feet b/l.  No evidence of infection noted.  Assessment/Plan: 2 Days Post-Op Procedure(s) (LRB):  REVISION  CONSTRAINED LINER LEFT TOTAL HIP  (Left) Continue Xarelto for anticoagulation.  Continue physical therapy.  Discharge today.  F/u with Dr.Olin in 2 weeks.  FLOWERS, CHRISTOPHER S 12/09/2013, 10:49 AM

## 2013-12-09 NOTE — Progress Notes (Signed)
Discharged from floor via w/c, family with pt. No changes in assessment. Girtha Kilgore  

## 2013-12-10 ENCOUNTER — Encounter (HOSPITAL_COMMUNITY): Payer: Self-pay | Admitting: Orthopedic Surgery

## 2013-12-17 NOTE — Discharge Summary (Signed)
Physician Discharge Summary  Patient ID: [name] MRN: [id number] DOB/AGE: 11/22/1945 68 y.o.  Admit date: 12/07/2013 Discharge date: 12/09/2013   Procedures:  Procedure(s) (LRB):  REVISION  CONSTRAINED LINER LEFT TOTAL HIP  (Left)  Attending Physician:  Dr. Durene Romans   Admission Diagnoses:   S/P revision left THA with slip of constrained liner  Discharge Diagnoses:  Principal Problem:   S/P left hip revision  Past Medical History  Diagnosis Date  . Anemia   . DVT (deep venous thrombosis)     bilateral lungs  . Hypothyroidism   . Peripheral vascular disease   . Arthritis     osteoarthritis  . Complication of anesthesia     hard time waking up after her gallbladder surgery 28 years ago  . PONV (postoperative nausea and vomiting)   . Hypertension   . Hypercholesteremia     under control  . Heart murmur   . History of kidney stones   . GERD (gastroesophageal reflux disease)     hx of    HPI: [name], 68 y.o. female, who presented to the clinic for hip pain. On 11/21/2013 she was bending over when she felt like her hip subluxed and since then she has had pain and discomfort in her hip, but no more feelings of that subluxation-type pain. She had a previous revision with a constrained liner placed in May of 2012 by Dr. Charlann Boxer. Onset of symptoms was abrupt with rapidlly worsening course since that time. Prior procedures on the left hip include total hip revision with constained liner. Patient currently rates pain in the left hip at 10 out of 10 with activity. There is night pain, worsening of pain with activity and weight bearing, trendelenberg gait, pain that interfers with activities of daily living and pain with passive range of motion. Risks, benefits and expectations were discussed with the patient. Risks including but not limited to the risk of anesthesia, blood clots, nerve damage, blood vessel damage, failure of the prosthesis, infection and up to and  including death. Patient understand the risks, benefits and expectations and wishes to proceed with surgery.   PCP: Gwen Pounds, MD   Discharged Condition: good  Hospital Course:  Patient underwent the above stated procedure on 12/07/2013. Patient tolerated the procedure well and brought to the recovery room in good condition and subsequently to the floor.  POD #1 BP: 90/56 ; Pulse: 80 ; Temp: 98.4 F (36.9 C) ; Resp: 16  Patient reports pain as moderate complaining of burning pain in the left hip incisional area. Not a lot of activity thus far. Has complained of mid thigh pain for a while. Neurovascular intact, dorsiflexion/plantar flexion intact, incision: dressing C/D/I, no cellulitis present and compartment soft.   LABS  Basename    HGB  9.9  HCT  32.3   POD #2  BP: 174/70 ; Pulse: 69 ; Temp: 97.3 F (36.3 C) ; Resp: 16  Patient doing well this AM, pain well controlled, progressing well with physical therapy. C/o mild nausea intermittently throughout the evening. Pt denies N/V/F/C, chest pain, SOB, calf pain, or paresthesia b/l.  Ready to be discharged home. Neurovascular intact, dorsiflexion/plantar flexion intact, incision: dressing C/D/I, no cellulitis present and compartment soft.   LABS  Basename    HGB  9.7  HCT  30.7    Discharge Exam: General appearance: alert, cooperative and no distress Extremities: Homans sign is negative, no sign of DVT, no edema, redness or tenderness in  the calves or thighs and no ulcers, gangrene or trophic changes  Disposition:    Home-Health Care Svc with follow up in 2 weeks   Follow-up Information   Follow up with Shelda Pal, MD. Schedule an appointment as soon as possible for a visit in 2 weeks.   Specialty:  Orthopedic Surgery   Contact information:   [address] [place] [address] [phone]       Discharge Orders   Future Orders Complete By Expires   Call MD / Call 911  As directed    Comments:      If you experience chest pain or shortness of breath, CALL 911 and be transported to the hospital emergency room.  If you develope a fever above 101 F, pus (white drainage) or increased drainage or redness at the wound, or calf pain, call your surgeon's office.   Call MD / Call 911  As directed    Comments:     If you experience chest pain or shortness of breath, CALL 911 and be transported to the hospital emergency room.  If you develope a fever above 101 F, pus (white drainage) or increased drainage or redness at the wound, or calf pain, call your surgeon's office.   Change dressing  As directed    Comments:     Maintain surgical dressing for 10-14 days, or until follow up in the clinic.   Constipation Prevention  As directed    Comments:     Drink plenty of fluids.  Prune juice may be helpful.  You may use a stool softener, such as Colace (over the counter) 100 mg twice a day.  Use MiraLax (over the counter) for constipation as needed.   Constipation Prevention  As directed    Comments:     Drink plenty of fluids.  Prune juice may be helpful.  You may use a stool softener, such as Colace (over the counter) 100 mg twice a day.  Use MiraLax (over the counter) for constipation as needed.   Diet - low sodium heart healthy  As directed    Diet - low sodium heart healthy  As directed    Discharge instructions  As directed    Comments:     Maintain surgical dressing for 10-14 days, or until follow up in the clinic. Follow up in 2 weeks at [facility]. Call with any questions or concerns.   Driving restrictions  As directed    Comments:     No driving for 6 weeks   Increase activity slowly as tolerated  As directed    Increase activity slowly as tolerated  As directed    Lifting restrictions  As directed    Comments:     No lifting for 6 weeks   TED hose  As directed    Comments:     Use stockings (TED hose) for 2 weeks on both leg(s).  You may remove them at night for sleeping.     Weight bearing as tolerated  As directed    Questions:     Laterality:     Extremity:          Medication List         ALPRAZolam 0.5 MG tablet  Commonly known as:  XANAX  Take 0.5 mg by mouth 2 (two) times daily.     atenolol 25 MG tablet  Commonly known as:  TENORMIN  Take 25 mg by mouth every morning.  B-12 PO  Take 6,000 mcg by mouth daily.     baclofen 10 MG tablet  Commonly known as:  LIORESAL  Take 1 tablet (10 mg total) by mouth every 8 (eight) hours.     Biotin 5000 MCG Caps  Take 5,000 mcg by mouth daily.     calcium-vitamin D 500-200 MG-UNIT per tablet  Take 2 tablets by mouth daily.     CINNAMON PO  Take 2,000 mg by mouth 2 (two) times daily.     docusate sodium 100 MG capsule  Commonly known as:  COLACE  Take 100 mg by mouth 2 (two) times daily.     furosemide 20 MG tablet  Commonly known as:  LASIX  Take 40 mg by mouth every morning.     gabapentin 600 MG tablet  Commonly known as:  NEURONTIN  Take 600 mg by mouth 3 (three) times daily.     HYDROcodone-acetaminophen 5-325 MG per tablet  Commonly known as:  NORCO/VICODIN  Take 1-2 tablets by mouth every 4 (four) hours as needed for severe pain.     HYDROcodone-acetaminophen 7.5-325 MG per tablet  Commonly known as:  NORCO  Take 1 tablet by mouth every 4 (four) hours as needed for moderate pain.     levothyroxine 50 MCG tablet  Commonly known as:  SYNTHROID, LEVOTHROID  Take 50 mcg by mouth daily before breakfast. Five day per week     methylcellulose 1 % ophthalmic solution  Commonly known as:  ARTIFICIAL TEARS  Place 1 drop into both eyes 3 (three) times daily as needed (dry eyes).     omeprazole 20 MG capsule  Commonly known as:  PRILOSEC  Take 20 mg by mouth daily.     OSTEO BI-FLEX TRIPLE STRENGTH PO  Take 1 tablet by mouth 2 (two) times daily.     OVER THE COUNTER MEDICATION  Take 2 tablets by mouth daily. Multivitamin plus Omega 3 supplement     OVER THE COUNTER  MEDICATION  Apply 1 application topically 3 (three) times daily as needed. [place] Dream Cream- Muscle Spasms     Rivaroxaban 20 MG Tabs tablet  Commonly known as:  XARELTO  Take 20 mg by mouth every evening.     Rivaroxaban 20 MG Tabs tablet  Commonly known as:  XARELTO  Take 1 tablet (20 mg total) by mouth every evening.     simvastatin 20 MG tablet  Commonly known as:  ZOCOR  Take 20 mg by mouth every morning.     VITAMIN C PO  Take 2 tablets by mouth every morning.            Signed: Anastasio AuerbachMatthew S. Aaminah Forrester   PAC  12/17/2013, 3:05 PM

## 2014-05-16 ENCOUNTER — Emergency Department (HOSPITAL_COMMUNITY)
Admission: EM | Admit: 2014-05-16 | Discharge: 2014-05-17 | Disposition: A | Discharge status: Home / Self Care | Payer: Medicare PPO | Attending: Emergency Medicine | Admitting: Emergency Medicine

## 2014-05-16 ENCOUNTER — Encounter (HOSPITAL_COMMUNITY): Payer: Self-pay | Admitting: Emergency Medicine

## 2014-05-16 ENCOUNTER — Emergency Department (HOSPITAL_COMMUNITY): Payer: Medicare PPO

## 2014-05-16 DIAGNOSIS — Y9389 Activity, other specified: Secondary | ICD-10-CM | POA: Insufficient documentation

## 2014-05-16 DIAGNOSIS — Z79899 Other long term (current) drug therapy: Secondary | ICD-10-CM | POA: Insufficient documentation

## 2014-05-16 DIAGNOSIS — S52509A Unspecified fracture of the lower end of unspecified radius, initial encounter for closed fracture: Secondary | ICD-10-CM | POA: Diagnosis not present

## 2014-05-16 DIAGNOSIS — E663 Overweight: Secondary | ICD-10-CM | POA: Diagnosis not present

## 2014-05-16 DIAGNOSIS — Z862 Personal history of diseases of the blood and blood-forming organs and certain disorders involving the immune mechanism: Secondary | ICD-10-CM | POA: Insufficient documentation

## 2014-05-16 DIAGNOSIS — R011 Cardiac murmur, unspecified: Secondary | ICD-10-CM | POA: Insufficient documentation

## 2014-05-16 DIAGNOSIS — S40012A Contusion of left shoulder, initial encounter: Secondary | ICD-10-CM

## 2014-05-16 DIAGNOSIS — M129 Arthropathy, unspecified: Secondary | ICD-10-CM | POA: Insufficient documentation

## 2014-05-16 DIAGNOSIS — S40019A Contusion of unspecified shoulder, initial encounter: Secondary | ICD-10-CM | POA: Insufficient documentation

## 2014-05-16 DIAGNOSIS — E039 Hypothyroidism, unspecified: Secondary | ICD-10-CM | POA: Insufficient documentation

## 2014-05-16 DIAGNOSIS — W010XXA Fall on same level from slipping, tripping and stumbling without subsequent striking against object, initial encounter: Secondary | ICD-10-CM | POA: Diagnosis not present

## 2014-05-16 DIAGNOSIS — W19XXXA Unspecified fall, initial encounter: Secondary | ICD-10-CM

## 2014-05-16 DIAGNOSIS — S6990XA Unspecified injury of unspecified wrist, hand and finger(s), initial encounter: Secondary | ICD-10-CM

## 2014-05-16 DIAGNOSIS — Y9289 Other specified places as the place of occurrence of the external cause: Secondary | ICD-10-CM | POA: Insufficient documentation

## 2014-05-16 DIAGNOSIS — I739 Peripheral vascular disease, unspecified: Secondary | ICD-10-CM | POA: Diagnosis not present

## 2014-05-16 DIAGNOSIS — S52602A Unspecified fracture of lower end of left ulna, initial encounter for closed fracture: Secondary | ICD-10-CM

## 2014-05-16 DIAGNOSIS — S52502A Unspecified fracture of the lower end of left radius, initial encounter for closed fracture: Secondary | ICD-10-CM

## 2014-05-16 DIAGNOSIS — Z86718 Personal history of other venous thrombosis and embolism: Secondary | ICD-10-CM | POA: Insufficient documentation

## 2014-05-16 DIAGNOSIS — Z87442 Personal history of urinary calculi: Secondary | ICD-10-CM | POA: Diagnosis not present

## 2014-05-16 DIAGNOSIS — S52612A Displaced fracture of left ulna styloid process, initial encounter for closed fracture: Secondary | ICD-10-CM

## 2014-05-16 DIAGNOSIS — F430 Acute stress reaction: Secondary | ICD-10-CM | POA: Insufficient documentation

## 2014-05-16 DIAGNOSIS — S20219A Contusion of unspecified front wall of thorax, initial encounter: Secondary | ICD-10-CM | POA: Diagnosis not present

## 2014-05-16 DIAGNOSIS — S20211A Contusion of right front wall of thorax, initial encounter: Secondary | ICD-10-CM

## 2014-05-16 DIAGNOSIS — R062 Wheezing: Secondary | ICD-10-CM | POA: Diagnosis not present

## 2014-05-16 DIAGNOSIS — S52609A Unspecified fracture of lower end of unspecified ulna, initial encounter for closed fracture: Principal | ICD-10-CM

## 2014-05-16 DIAGNOSIS — S59909A Unspecified injury of unspecified elbow, initial encounter: Secondary | ICD-10-CM | POA: Insufficient documentation

## 2014-05-16 DIAGNOSIS — I10 Essential (primary) hypertension: Secondary | ICD-10-CM | POA: Insufficient documentation

## 2014-05-16 DIAGNOSIS — S59919A Unspecified injury of unspecified forearm, initial encounter: Secondary | ICD-10-CM

## 2014-05-16 MED ORDER — HYDROMORPHONE HCL PF 1 MG/ML IJ SOLN
1.0000 mg | Freq: Once | INTRAMUSCULAR | Status: AC
  Administered 2014-05-16: 1 mg via INTRAVENOUS
  Filled 2014-05-16: qty 1

## 2014-05-16 MED ORDER — ONDANSETRON HCL 4 MG/2ML IJ SOLN
4.0000 mg | Freq: Once | INTRAMUSCULAR | Status: AC
  Administered 2014-05-16: 4 mg via INTRAVENOUS
  Filled 2014-05-16: qty 2

## 2014-05-16 MED ORDER — SODIUM CHLORIDE 0.9 % IV SOLN
INTRAVENOUS | Status: DC
  Administered 2014-05-16: 23:00:00 via INTRAVENOUS

## 2014-05-16 MED ORDER — HYDROMORPHONE HCL PF 1 MG/ML IJ SOLN
0.5000 mg | Freq: Once | INTRAMUSCULAR | Status: AC
  Administered 2014-05-16: 0.5 mg via INTRAVENOUS
  Filled 2014-05-16: qty 1

## 2014-05-16 NOTE — ED Provider Notes (Signed)
CSN: [id number]     Arrival date & time 05/16/14  2045 History   First MD Initiated Contact with Patient 05/16/14 2128     Chief Complaint  Patient presents with  . Fall  . Wrist Pain     (Consider location/radiation/quality/duration/timing/severity/associated sxs/prior Treatment) HPI [name] is a 68 y.o. female who states that she was working in yard, watering plants, when she slipped on some wet dirt and fell forward. She injured her left wrist, right, ribs, and left shoulder. She was able to get up with assistance. She did not injure her head, neck, or back. She is recovering from a right wrist fracture, 5 weeks ago, and hip surgery, prior to that. She denies other prodrome prior to the fall. She is taking her usual medications, without relief. There are no other known modifying factors.   Past Medical History  Diagnosis Date  . Anemia   . DVT (deep venous thrombosis)     bilateral lungs  . Hypothyroidism   . Peripheral vascular disease   . Arthritis     osteoarthritis  . Complication of anesthesia     hard time waking up after her gallbladder surgery 28 years ago  . PONV (postoperative nausea and vomiting)   . Hypertension   . Hypercholesteremia     under control  . Heart murmur   . History of kidney stones   . GERD (gastroesophageal reflux disease)     hx of   Past Surgical History  Procedure Laterality Date  . Filter Left 2009    IVC  . Hip surgery Left 2008  . Total shoulder replacement Right 11/30/2010  . Back surgery      X14 [id number]  . Mass excision  1974    uterus  . Colonoscopy    . Dilation and curettage of uterus    . Endarterectomy Right 08/10/2013    Procedure: Excision of Venous Aneurysm Right Neck;  Surgeon: Larina Earthly, MD;  Location: [facility] OR;  Service: Vascular;  Laterality: Right;  . Joint replacement Right 11/2003    (knee)multiple surgeries  . Joint replacement Left 12/2009    (knee)  . Joint replacement Left 2011, 2012    Hip  .  Carpal tunnel release Bilateral   . Cholecystectomy  1997  . Tonsillectomy  1986  . Tubal ligation  1973  . Abdominal hysterectomy  1975    partial  . Appendectomy  1973  . Cardiac catheterization  ~2010  . Total hip revision Left 12/07/2013    Procedure:  REVISION  CONSTRAINED LINER LEFT TOTAL HIP ;  Surgeon: Shelda Pal, MD;  Location: [facility] ORS;  Service: Orthopedics;  Laterality: Left;   Family History  Problem Relation Age of Onset  . Diabetes Mother   . Heart disease Mother     before age 79  . Hyperlipidemia Mother   . Hypertension Mother   . Diabetes Father   . Hyperlipidemia Father   . Hypertension Father   . Heart attack Father   . Peripheral vascular disease Father   . Other Father     amputation  . Diabetes Sister   . Hyperlipidemia Sister   . Hypertension Sister   . Deep vein thrombosis Brother   . Hyperlipidemia Brother   . Hypertension Brother    History  Substance Use Topics  . Smoking status: Never Smoker   . Smokeless tobacco: Never Used  . Alcohol Use: No   OB History   Grav  Para Term Preterm Abortions TAB SAB Ect Mult Living                 Review of Systems  All other systems reviewed and are negative.     Allergies  Review of patient's allergies indicates no known allergies.  Home Medications   Prior to Admission medications   Medication Sig Start Date End Date Taking? Authorizing Provider  ALPRAZolam Prudy Feeler) 0.5 MG tablet Take 0.5 mg by mouth 2 (two) times daily.  07/17/13  Yes Historical Provider, MD  Ascorbic Acid (VITAMIN C PO) Take 2 tablets by mouth every morning.    Yes Historical Provider, MD  atenolol (TENORMIN) 25 MG tablet Take 25 mg by mouth every morning.  06/11/13  Yes Historical Provider, MD  baclofen (LIORESAL) 10 MG tablet Take 1 tablet (10 mg total) by mouth every 8 (eight) hours. 12/07/13  Yes Genelle Gather Babish, PA-C  Biotin 5000 MCG CAPS Take 5,000 mcg by mouth daily.    Yes Historical Provider, MD  Calcium  Carbonate-Vitamin D (CALCIUM-VITAMIN D) 500-200 MG-UNIT per tablet Take 2 tablets by mouth daily.   Yes Historical Provider, MD  CINNAMON PO Take 2,000 mg by mouth 2 (two) times daily.    Yes Historical Provider, MD  Cyanocobalamin (B-12 PO) Take 6,000 mcg by mouth daily.    Yes Historical Provider, MD  docusate sodium (COLACE) 100 MG capsule Take 100 mg by mouth 2 (two) times daily.   Yes Historical Provider, MD  furosemide (LASIX) 20 MG tablet Take 40 mg by mouth every morning.  05/28/13  Yes Historical Provider, MD  gabapentin (NEURONTIN) 600 MG tablet Take 600 mg by mouth 3 (three) times daily.  05/25/13  Yes Historical Provider, MD  levothyroxine (SYNTHROID, LEVOTHROID) 25 MCG tablet Take 25 mcg by mouth daily before breakfast.   Yes Historical Provider, MD  Misc Natural Products (OSTEO BI-FLEX TRIPLE STRENGTH PO) Take 1 tablet by mouth 2 (two) times daily.   Yes Historical Provider, MD  omeprazole (PRILOSEC) 20 MG capsule Take 20 mg by mouth daily.   Yes Historical Provider, MD  OVER THE COUNTER MEDICATION Take 2 tablets by mouth daily. Multivitamin plus Omega 3 supplement   Yes Historical Provider, MD  OVER THE COUNTER MEDICATION Apply 1 application topically 3 (three) times daily as needed. [place] Dream Cream- Muscle Spasms   Yes Historical Provider, MD  Rivaroxaban (XARELTO) 20 MG TABS tablet Take 20 mg by mouth every evening. 08/11/13  Yes Larina Earthly, MD  simvastatin (ZOCOR) 20 MG tablet Take 20 mg by mouth every morning.  05/28/13  Yes Historical Provider, MD  oxyCODONE-acetaminophen (PERCOCET) 5-325 MG per tablet Take 2 tablets by mouth every 4 (four) hours as needed. 05/17/14   Flint Melter, MD   BP 154/68  Pulse 85  Temp(Src) 98.9 F (37.2 C) (Oral)  Resp 20  SpO2 92% Physical Exam  Nursing note and vitals reviewed. Constitutional: She is oriented to person, place, and time. She appears well-developed.  Overweight  HENT:  Head: Normocephalic and atraumatic.  Eyes:  Conjunctivae and EOM are normal. Pupils are equal, round, and reactive to light.  Neck: Normal range of motion and phonation normal. Neck supple.  Cardiovascular: Normal rate, regular rhythm and intact distal pulses.   Pulmonary/Chest: Effort normal. She has wheezes (few scattered wheezes). She exhibits no tenderness.  Tender right ribs, without crepitation or deformity  Abdominal: Soft. She exhibits no distension. There is no tenderness. There is no guarding.  Musculoskeletal: Normal range of motion.  Tender left shoulder, mild without deformity. Nontender. Left elbow. Tender left wrist with swelling and mild deformity. Neurovascularly intact distally in the fingers of the left hand. No skin tear around the wrist or hand, left.  Neurological: She is alert and oriented to person, place, and time. She exhibits normal muscle tone.  Skin: Skin is warm and dry.  Psychiatric: She has a normal mood and affect. Her behavior is normal. Judgment and thought content normal.    ED Course  Procedures (including critical care time)  Medications  0.9 %  sodium chloride infusion ( Intravenous Stopped 05/17/14 0014)  HYDROmorphone (DILAUDID) injection 1 mg (1 mg Intravenous Given 05/16/14 2245)  ondansetron (ZOFRAN) injection 4 mg (4 mg Intravenous Given 05/16/14 2245)  HYDROmorphone (DILAUDID) injection 0.5 mg (0.5 mg Intravenous Given 05/16/14 2304)    Patient Vitals for the past 24 hrs:  BP Temp Temp src Pulse Resp SpO2  05/16/14 2307 154/68 mmHg 98.9 F (37.2 C) Oral 85 20 92 %  05/16/14 2101 163/90 mmHg 98.1 F (36.7 C) Oral 82 22 100 %   12:13 AM- procedure  Sugar tong splint, apply to left arm, supervised by me- evaluation. Application reveals normal sensation and circulation, to the fingers of the left and  10:52 PM Reevaluation with update and discussion. After initial assessment and treatment, an updated evaluation reveals she is comfortable now, smiling, and improved. It is discussed with  patient, and family members, all questions answered. Glorya Bartley L    Case discussed with Dr. Victorino DikeHewitt; on-call for her orthopedist, after reviewing her films, he recommends treating her with a left wrist splint, and having her followup with her hand surgeon, in 3 days.   Labs Review Labs Reviewed - No data to display  Imaging Review Dg Ribs Unilateral W/chest Right  05/16/2014   CLINICAL DATA:  Pain post trauma  EXAM: RIGHT RIBS AND CHEST - 3+ VIEW  COMPARISON:  Chest radiograph August 11, 2003  FINDINGS: Frontal chest as well as oblique and lower rib images were obtained. There is mild left base atelectatic change. Lungs are otherwise clear. Heart is upper normal in size with normal pulmonary vascularity.  No effusion or pneumothorax. There is postoperative change in the lower thoracic and lumbar spine. There is a total shoulder replacement.  There is no demonstrable rib fracture.  IMPRESSION: No demonstrable rib fracture. No pneumothorax. Mild left base atelectasis. No edema or consolidation.   Electronically Signed   By: Bretta BangWilliam  Woodruff M.D.   On: 05/16/2014 21:38   Dg Wrist Complete Left  05/16/2014   CLINICAL DATA:  Larey SeatFell today with left wrist pain and deformity.  EXAM: LEFT WRIST - COMPLETE 3+ VIEW  COMPARISON:  None.  FINDINGS: Transverse comminuted fracture of the distal left radial metaphysis. Fracture lines appear to extend to the radial ulnar joint and probably to the radiocarpal joint. There is impaction of the fracture fragments with dorsal angulation of the distal fragments. Comminuted displaced fractures of the left ulnar styloid process. Diffuse soft tissue swelling. Degenerative changes in the carpus, first carpometacarpal, and first metacarpal phalangeal joints.  IMPRESSION: Fractures of the distal left radial metaphysis and ulnar styloid process as described.   Electronically Signed   By: Burman NievesWilliam  Stevens M.D.   On: 05/16/2014 21:37   Dg Shoulder Left  05/16/2014   CLINICAL  DATA:  Pain post trauma  EXAM: LEFT SHOULDER - 2+ VIEW  COMPARISON:  None.  FINDINGS: Frontal and Y scapular  images were obtained. There is no apparent fracture or dislocation. There is moderate generalized osteoarthritic change. No erosive change.  IMPRESSION: Moderate generalized osteoarthritic change. No fracture or dislocation.   Electronically Signed   By: Bretta Bang M.D.   On: 05/16/2014 21:36     EKG Interpretation None      MDM   Final diagnoses:  Fall, initial encounter  Fracture of distal radius and ulna, left, closed, initial encounter  Fracture of ulnar styloid, left, closed, initial encounter  Contusion of left shoulder, initial encounter  Contusion of right chest wall, initial encounter     Accidental fall with fracture, left arm, and multiple contusions. She is stable for discharge, with outpatient followup for definitive management of the left distal radius fracture.  Nursing Notes Reviewed/ Care Coordinated Applicable Imaging Reviewed Interpretation of Laboratory Data incorporated into ED treatment  The patient appears reasonably screened and/or stabilized for discharge and I doubt any other medical condition or other [facility] requiring further screening, evaluation, or treatment in the ED at this time prior to discharge.  Plan: Home Medications- PErcocet; Home Treatments- rest, elevation; return here if the recommended treatment, does not improve the symptoms; Recommended follow up- Hand Surg. 3 days    Flint Melter, MD 05/17/14 [phone]

## 2014-05-16 NOTE — ED Notes (Signed)
Pt reports she was working in her yard today when she fell.  Tried to catch her fall with her L hand.  Pt c/o L wrist, L shoulder and R mid back pain.  Obvious deformity noted in her L wrist.

## 2014-05-16 NOTE — ED Notes (Signed)
Patient transported to X-ray 

## 2014-05-16 NOTE — ED Notes (Signed)
Iv attempt times 2

## 2014-05-17 MED ORDER — OXYCODONE-ACETAMINOPHEN 5-325 MG PO TABS: 2.0000 | ORAL_TABLET | ORAL | Status: DC | PRN

## 2014-05-17 MED ORDER — OXYCODONE-ACETAMINOPHEN 5-325 MG PO TABS
1.0000 | ORAL_TABLET | Freq: Once | ORAL | Status: AC
  Administered 2014-05-17: 1 via ORAL
  Filled 2014-05-17: qty 1

## 2014-05-17 NOTE — Discharge Instructions (Signed)
Elevate her left hand above the heart, as much as possible. Use the sling as needed for comfort.   Forearm Fracture Your caregiver has diagnosed you as having a broken bone (fracture) of the forearm. This is the part of your arm between the elbow and your wrist. Your forearm is made up of two bones. These are the radius and ulna. A fracture is a break in one or both bones. A cast or splint is used to protect and keep your injured bone from moving. The cast or splint will be on generally for about 5 to 6 weeks, with individual variations. HOME CARE INSTRUCTIONS   Keep the injured part elevated while sitting or lying down. Keeping the injury above the level of your heart (the center of the chest). This will decrease swelling and pain.  Apply ice to the injury for 15-20 minutes, 03-04 times per day while awake, for 2 days. Put the ice in a plastic bag and place a thin towel between the bag of ice and your cast or splint.  If you have a plaster or fiberglass cast:  Do not try to scratch the skin under the cast using sharp or pointed objects.  Check the skin around the cast every day. You may put lotion on any red or sore areas.  Keep your cast dry and clean.  If you have a plaster splint:  Wear the splint as directed.  You may loosen the elastic around the splint if your fingers become numb, tingle, or turn cold or blue.  Do not put pressure on any part of your cast or splint. It may break. Rest your cast only on a pillow the first 24 hours until it is fully hardened.  Your cast or splint can be protected during bathing with a plastic bag. Do not lower the cast or splint into water.  Only take over-the-counter or prescription medicines for pain, discomfort, or fever as directed by your caregiver. SEEK IMMEDIATE MEDICAL CARE IF:   Your cast gets damaged or breaks.  You have more severe pain or swelling than you did before the cast.  Your skin or nails below the injury turn blue or  gray, or feel cold or numb.  There is a bad smell or new stains and/or pus like (purulent) drainage coming from under the cast. MAKE SURE YOU:   Understand these instructions.  Will watch your condition.  Will get help right away if you are not doing well or get worse. Document Released: 10/01/2000 Document Revised: 12/27/2011 Document Reviewed: 05/23/2008 [facility] Patient Information 2015 [place]. This information is not intended to replace advice given to you by your health care provider. Make sure you discuss any questions you have with your health care provider.  Contusion A contusion is a deep bruise. Contusions are the result of an injury that caused bleeding under the skin. The contusion may turn blue, purple, or yellow. Minor injuries will give you a painless contusion, but more severe contusions may stay painful and swollen for a few weeks.  CAUSES  A contusion is usually caused by a blow, trauma, or direct force to an area of the body. SYMPTOMS   Swelling and redness of the injured area.  Bruising of the injured area.  Tenderness and soreness of the injured area.  Pain. DIAGNOSIS  The diagnosis can be made by taking a history and physical exam. An X-ray, CT scan, or MRI may be needed to determine if there were any associated injuries,  such as fractures. TREATMENT  Specific treatment will depend on what area of the body was injured. In general, the best treatment for a contusion is resting, icing, elevating, and applying cold compresses to the injured area. Over-the-counter medicines may also be recommended for pain control. Ask your caregiver what the best treatment is for your contusion. HOME CARE INSTRUCTIONS   Put ice on the injured area.  Put ice in a plastic bag.  Place a towel between your skin and the bag.  Leave the ice on for 15-20 minutes, 3-4 times a day, or as directed by your health care provider.  Only take over-the-counter or prescription  medicines for pain, discomfort, or fever as directed by your caregiver. Your caregiver may recommend avoiding anti-inflammatory medicines (aspirin, ibuprofen, and naproxen) for 48 hours because these medicines may increase bruising.  Rest the injured area.  If possible, elevate the injured area to reduce swelling. SEEK IMMEDIATE MEDICAL CARE IF:   You have increased bruising or swelling.  You have pain that is getting worse.  Your swelling or pain is not relieved with medicines. MAKE SURE YOU:   Understand these instructions.  Will watch your condition.  Will get help right away if you are not doing well or get worse. Document Released: 07/14/2005 Document Revised: 10/09/2013 Document Reviewed: 08/09/2011 [facility] Patient Information 2015 [place], [place]. This information is not intended to replace advice given to you by your health care provider. Make sure you discuss any questions you have with your health care provider.

## 2014-12-24 DIAGNOSIS — M549 Dorsalgia, unspecified: Secondary | ICD-10-CM | POA: Diagnosis not present

## 2014-12-24 DIAGNOSIS — M8580 Other specified disorders of bone density and structure, unspecified site: Secondary | ICD-10-CM | POA: Diagnosis not present

## 2014-12-24 DIAGNOSIS — G894 Chronic pain syndrome: Secondary | ICD-10-CM | POA: Diagnosis not present

## 2014-12-24 DIAGNOSIS — M25552 Pain in left hip: Secondary | ICD-10-CM | POA: Diagnosis not present

## 2014-12-24 DIAGNOSIS — I10 Essential (primary) hypertension: Secondary | ICD-10-CM | POA: Diagnosis not present

## 2014-12-24 DIAGNOSIS — F419 Anxiety disorder, unspecified: Secondary | ICD-10-CM | POA: Diagnosis not present

## 2014-12-24 DIAGNOSIS — G609 Hereditary and idiopathic neuropathy, unspecified: Secondary | ICD-10-CM | POA: Diagnosis not present

## 2014-12-24 DIAGNOSIS — E039 Hypothyroidism, unspecified: Secondary | ICD-10-CM | POA: Diagnosis not present

## 2015-01-06 DIAGNOSIS — H5201 Hypermetropia, right eye: Secondary | ICD-10-CM | POA: Diagnosis not present

## 2015-01-06 DIAGNOSIS — H5210 Myopia, unspecified eye: Secondary | ICD-10-CM | POA: Diagnosis not present

## 2015-02-07 DIAGNOSIS — J019 Acute sinusitis, unspecified: Secondary | ICD-10-CM | POA: Diagnosis not present

## 2015-02-07 DIAGNOSIS — I10 Essential (primary) hypertension: Secondary | ICD-10-CM | POA: Diagnosis not present

## 2015-02-07 DIAGNOSIS — M25512 Pain in left shoulder: Secondary | ICD-10-CM | POA: Diagnosis not present

## 2015-02-07 DIAGNOSIS — Z6841 Body Mass Index (BMI) 40.0 and over, adult: Secondary | ICD-10-CM | POA: Diagnosis not present

## 2015-03-26 DIAGNOSIS — M859 Disorder of bone density and structure, unspecified: Secondary | ICD-10-CM | POA: Diagnosis not present

## 2015-03-31 DIAGNOSIS — M19012 Primary osteoarthritis, left shoulder: Secondary | ICD-10-CM | POA: Insufficient documentation

## 2015-03-31 DIAGNOSIS — Z888 Allergy status to other drugs, medicaments and biological substances status: Secondary | ICD-10-CM | POA: Diagnosis not present

## 2015-03-31 DIAGNOSIS — M7522 Bicipital tendinitis, left shoulder: Secondary | ICD-10-CM | POA: Insufficient documentation

## 2015-03-31 DIAGNOSIS — Z96611 Presence of right artificial shoulder joint: Secondary | ICD-10-CM | POA: Diagnosis not present

## 2015-03-31 DIAGNOSIS — M1990 Unspecified osteoarthritis, unspecified site: Secondary | ICD-10-CM | POA: Diagnosis not present

## 2015-03-31 DIAGNOSIS — Z471 Aftercare following joint replacement surgery: Secondary | ICD-10-CM | POA: Diagnosis not present

## 2015-03-31 DIAGNOSIS — M25512 Pain in left shoulder: Secondary | ICD-10-CM | POA: Insufficient documentation

## 2015-03-31 DIAGNOSIS — I10 Essential (primary) hypertension: Secondary | ICD-10-CM | POA: Diagnosis not present

## 2015-03-31 DIAGNOSIS — M25511 Pain in right shoulder: Secondary | ICD-10-CM | POA: Diagnosis not present

## 2015-03-31 DIAGNOSIS — M7552 Bursitis of left shoulder: Secondary | ICD-10-CM | POA: Diagnosis not present

## 2015-03-31 DIAGNOSIS — M75102 Unspecified rotator cuff tear or rupture of left shoulder, not specified as traumatic: Secondary | ICD-10-CM | POA: Diagnosis not present

## 2015-06-27 DIAGNOSIS — N390 Urinary tract infection, site not specified: Secondary | ICD-10-CM | POA: Diagnosis not present

## 2015-07-07 DIAGNOSIS — Z1212 Encounter for screening for malignant neoplasm of rectum: Secondary | ICD-10-CM | POA: Diagnosis not present

## 2015-07-08 DIAGNOSIS — M79676 Pain in unspecified toe(s): Secondary | ICD-10-CM | POA: Diagnosis not present

## 2015-07-08 DIAGNOSIS — Z6841 Body Mass Index (BMI) 40.0 and over, adult: Secondary | ICD-10-CM | POA: Diagnosis not present

## 2015-10-15 DIAGNOSIS — Z1212 Encounter for screening for malignant neoplasm of rectum: Secondary | ICD-10-CM | POA: Diagnosis not present

## 2015-10-15 DIAGNOSIS — Z6841 Body Mass Index (BMI) 40.0 and over, adult: Secondary | ICD-10-CM | POA: Diagnosis not present

## 2015-10-15 DIAGNOSIS — Z01419 Encounter for gynecological examination (general) (routine) without abnormal findings: Secondary | ICD-10-CM | POA: Diagnosis not present

## 2015-10-16 ENCOUNTER — Other Ambulatory Visit: Payer: Self-pay | Admitting: Obstetrics and Gynecology

## 2015-10-16 DIAGNOSIS — N644 Mastodynia: Secondary | ICD-10-CM

## 2015-10-21 ENCOUNTER — Other Ambulatory Visit: Payer: Medicare PPO

## 2015-10-28 ENCOUNTER — Ambulatory Visit
Admission: RE | Admit: 2015-10-28 | Discharge: 2015-10-28 | Disposition: A | Discharge status: Home / Self Care | Payer: PPO | Source: Ambulatory Visit | Attending: Obstetrics and Gynecology | Admitting: Obstetrics and Gynecology

## 2015-10-28 ENCOUNTER — Other Ambulatory Visit: Payer: Self-pay | Admitting: Obstetrics and Gynecology

## 2015-10-28 DIAGNOSIS — N644 Mastodynia: Secondary | ICD-10-CM | POA: Diagnosis not present

## 2015-10-28 DIAGNOSIS — N6011 Diffuse cystic mastopathy of right breast: Secondary | ICD-10-CM | POA: Diagnosis not present

## 2015-10-28 DIAGNOSIS — N63 Unspecified lump in unspecified breast: Secondary | ICD-10-CM

## 2015-10-28 DIAGNOSIS — N6012 Diffuse cystic mastopathy of left breast: Secondary | ICD-10-CM | POA: Diagnosis not present

## 2015-12-23 DIAGNOSIS — L821 Other seborrheic keratosis: Secondary | ICD-10-CM | POA: Diagnosis not present

## 2015-12-29 ENCOUNTER — Other Ambulatory Visit: Payer: Self-pay | Admitting: Internal Medicine

## 2015-12-29 DIAGNOSIS — K219 Gastro-esophageal reflux disease without esophagitis: Secondary | ICD-10-CM

## 2016-01-01 ENCOUNTER — Ambulatory Visit
Admission: RE | Admit: 2016-01-01 | Discharge: 2016-01-01 | Disposition: A | Discharge status: Home / Self Care | Payer: PPO | Source: Ambulatory Visit | Attending: Internal Medicine | Admitting: Internal Medicine

## 2016-01-01 ENCOUNTER — Other Ambulatory Visit: Payer: Self-pay | Admitting: Internal Medicine

## 2016-01-01 DIAGNOSIS — Z1389 Encounter for screening for other disorder: Secondary | ICD-10-CM | POA: Diagnosis not present

## 2016-01-01 DIAGNOSIS — Z6841 Body Mass Index (BMI) 40.0 and over, adult: Secondary | ICD-10-CM | POA: Diagnosis not present

## 2016-01-01 DIAGNOSIS — K449 Diaphragmatic hernia without obstruction or gangrene: Secondary | ICD-10-CM | POA: Diagnosis not present

## 2016-01-01 DIAGNOSIS — I10 Essential (primary) hypertension: Secondary | ICD-10-CM | POA: Diagnosis not present

## 2016-01-01 DIAGNOSIS — E039 Hypothyroidism, unspecified: Secondary | ICD-10-CM | POA: Diagnosis not present

## 2016-01-01 DIAGNOSIS — K219 Gastro-esophageal reflux disease without esophagitis: Secondary | ICD-10-CM

## 2016-01-01 DIAGNOSIS — F1120 Opioid dependence, uncomplicated: Secondary | ICD-10-CM | POA: Diagnosis not present

## 2016-01-01 DIAGNOSIS — G609 Hereditary and idiopathic neuropathy, unspecified: Secondary | ICD-10-CM | POA: Diagnosis not present

## 2016-01-01 DIAGNOSIS — D6489 Other specified anemias: Secondary | ICD-10-CM | POA: Diagnosis not present

## 2016-01-01 DIAGNOSIS — G894 Chronic pain syndrome: Secondary | ICD-10-CM | POA: Diagnosis not present

## 2016-01-01 DIAGNOSIS — I517 Cardiomegaly: Secondary | ICD-10-CM | POA: Diagnosis not present

## 2016-01-29 DIAGNOSIS — H31012 Macula scars of posterior pole (postinflammatory) (post-traumatic), left eye: Secondary | ICD-10-CM | POA: Diagnosis not present

## 2016-01-29 DIAGNOSIS — H2513 Age-related nuclear cataract, bilateral: Secondary | ICD-10-CM | POA: Diagnosis not present

## 2016-01-29 DIAGNOSIS — H2512 Age-related nuclear cataract, left eye: Secondary | ICD-10-CM | POA: Diagnosis not present

## 2016-02-13 DIAGNOSIS — Z6841 Body Mass Index (BMI) 40.0 and over, adult: Secondary | ICD-10-CM | POA: Diagnosis not present

## 2016-02-13 DIAGNOSIS — K219 Gastro-esophageal reflux disease without esophagitis: Secondary | ICD-10-CM | POA: Diagnosis not present

## 2016-02-13 DIAGNOSIS — E038 Other specified hypothyroidism: Secondary | ICD-10-CM | POA: Diagnosis not present

## 2016-02-13 DIAGNOSIS — I10 Essential (primary) hypertension: Secondary | ICD-10-CM | POA: Diagnosis not present

## 2016-02-13 DIAGNOSIS — R0789 Other chest pain: Secondary | ICD-10-CM | POA: Diagnosis not present

## 2016-02-18 DIAGNOSIS — H2512 Age-related nuclear cataract, left eye: Secondary | ICD-10-CM | POA: Diagnosis not present

## 2016-04-13 ENCOUNTER — Other Ambulatory Visit: Payer: Self-pay

## 2016-04-13 ENCOUNTER — Emergency Department (HOSPITAL_COMMUNITY): Payer: PPO

## 2016-04-13 ENCOUNTER — Encounter (HOSPITAL_COMMUNITY): Payer: Self-pay | Admitting: Nurse Practitioner

## 2016-04-13 ENCOUNTER — Emergency Department (HOSPITAL_COMMUNITY)
Admission: EM | Admit: 2016-04-13 | Discharge: 2016-04-13 | Disposition: A | Discharge status: Home / Self Care | Payer: PPO | Attending: Emergency Medicine | Admitting: Emergency Medicine

## 2016-04-13 DIAGNOSIS — R0789 Other chest pain: Secondary | ICD-10-CM | POA: Diagnosis not present

## 2016-04-13 DIAGNOSIS — Z7901 Long term (current) use of anticoagulants: Secondary | ICD-10-CM | POA: Diagnosis not present

## 2016-04-13 DIAGNOSIS — I10 Essential (primary) hypertension: Secondary | ICD-10-CM | POA: Diagnosis not present

## 2016-04-13 DIAGNOSIS — Z79899 Other long term (current) drug therapy: Secondary | ICD-10-CM | POA: Diagnosis not present

## 2016-04-13 DIAGNOSIS — Z96642 Presence of left artificial hip joint: Secondary | ICD-10-CM | POA: Diagnosis not present

## 2016-04-13 DIAGNOSIS — Z96653 Presence of artificial knee joint, bilateral: Secondary | ICD-10-CM | POA: Insufficient documentation

## 2016-04-13 DIAGNOSIS — R079 Chest pain, unspecified: Secondary | ICD-10-CM

## 2016-04-13 DIAGNOSIS — R600 Localized edema: Secondary | ICD-10-CM | POA: Insufficient documentation

## 2016-04-13 LAB — I-STAT TROPONIN, ED
Troponin i, poc: 0 ng/mL (ref 0.00–0.08)
Troponin i, poc: 0 ng/mL (ref 0.00–0.08)

## 2016-04-13 LAB — CBC
HCT: 39.6 % (ref 36.0–46.0)
Hemoglobin: 12.5 g/dL (ref 12.0–15.0)
MCH: 27.8 pg (ref 26.0–34.0)
MCHC: 31.6 g/dL (ref 30.0–36.0)
MCV: 88.2 fL (ref 78.0–100.0)
PLATELETS: 255 10*3/uL (ref 150–400)
RBC: 4.49 MIL/uL (ref 3.87–5.11)
RDW: 13.9 % (ref 11.5–15.5)
WBC: 6.2 10*3/uL (ref 4.0–10.5)

## 2016-04-13 LAB — BASIC METABOLIC PANEL
Anion gap: 10 (ref 5–15)
BUN: 13 mg/dL (ref 6–20)
CALCIUM: 10.3 mg/dL (ref 8.9–10.3)
CO2: 28 mmol/L (ref 22–32)
CREATININE: 0.94 mg/dL (ref 0.44–1.00)
Chloride: 100 mmol/L — ABNORMAL LOW (ref 101–111)
Glucose, Bld: 92 mg/dL (ref 65–99)
Potassium: 4.3 mmol/L (ref 3.5–5.1)
SODIUM: 138 mmol/L (ref 135–145)

## 2016-04-13 LAB — PROTIME-INR
INR: 1.34 (ref 0.00–1.49)
PROTHROMBIN TIME: 16.7 s — AB (ref 11.6–15.2)

## 2016-04-13 LAB — D-DIMER, QUANTITATIVE (NOT AT ARMC)

## 2016-04-13 LAB — APTT: aPTT: 33 seconds (ref 24–37)

## 2016-04-13 MED ORDER — ASPIRIN 81 MG PO CHEW
324.0000 mg | CHEWABLE_TABLET | Freq: Once | ORAL | Status: AC
  Administered 2016-04-13: 324 mg via ORAL
  Filled 2016-04-13: qty 4

## 2016-04-13 MED ORDER — ACETAMINOPHEN 325 MG PO TABS
650.0000 mg | ORAL_TABLET | Freq: Once | ORAL | Status: AC
  Administered 2016-04-13: 650 mg via ORAL
  Filled 2016-04-13: qty 2

## 2016-04-13 MED ORDER — SUCRALFATE 1 G PO TABS: 1.0000 g | ORAL_TABLET | Freq: Three times a day (TID) | ORAL | Status: DC

## 2016-04-13 NOTE — Discharge Instructions (Signed)

## 2016-04-13 NOTE — ED Provider Notes (Signed)
CSN: [id number]     Arrival date & time 04/13/16  1049 History   First MD Initiated Contact with Patient 04/13/16 1105     Chief Complaint  Patient presents with  . Chest Pain    HPI Patient presents to the emergency room for evaluation of chest discomfort that started yesterday. Patient started having discomfort in the center and left side of her chest underneath her breast yesterday. She was having intermittent episodes initially. She thought it might be indigestion so she took some Zantac and Tums. It may have helped a little bit but it woke her up from sleep. She felt flushed at one point and has had some episodes where she felt lightheaded and dizzy. At home she checked her blood pressure when she was having more symptoms and her blood pressure was elevated and her heart rate was low. Patient called her doctor who suggested she come to the emergency room to be evaluated. She does not have any history of heart disease. She states she had a normal cardiac catheterization in the past but it was many years ago. She denies any fevers or chills. No coughing. No shortness of breath. No new leg swelling. Nothing seems to make the pain particularly better or worse. Past Medical History  Diagnosis Date  . Anemia   . DVT (deep venous thrombosis) (HCC)     bilateral lungs  . Hypothyroidism   . Peripheral vascular disease (HCC)   . Arthritis     osteoarthritis  . Complication of anesthesia     hard time waking up after her gallbladder surgery 28 years ago  . PONV (postoperative nausea and vomiting)   . Hypertension   . Hypercholesteremia     under control  . Heart murmur   . History of kidney stones   . GERD (gastroesophageal reflux disease)     hx of   Past Surgical History  Procedure Laterality Date  . Filter Left 2009    IVC  . Hip surgery Left 2008  . Total shoulder replacement Right 11/30/2010  . Back surgery      X14 [id number]  . Mass excision  1974    uterus  . Colonoscopy    .  Dilation and curettage of uterus    . Endarterectomy Right 08/10/2013    Procedure: Excision of Venous Aneurysm Right Neck;  Surgeon: Larina Earthly, MD;  Location: [facility] OR;  Service: Vascular;  Laterality: Right;  . Joint replacement Right 11/2003    (knee)multiple surgeries  . Joint replacement Left 12/2009    (knee)  . Joint replacement Left 2011, 2012    Hip  . Carpal tunnel release Bilateral   . Cholecystectomy  1997  . Tonsillectomy  1986  . Tubal ligation  1973  . Abdominal hysterectomy  1975    partial  . Appendectomy  1973  . Cardiac catheterization  ~2010  . Total hip revision Left 12/07/2013    Procedure:  REVISION  CONSTRAINED LINER LEFT TOTAL HIP ;  Surgeon: Shelda Pal, MD;  Location: [facility] ORS;  Service: Orthopedics;  Laterality: Left;   Family History  Problem Relation Age of Onset  . Diabetes Mother   . Heart disease Mother     before age 69  . Hyperlipidemia Mother   . Hypertension Mother   . Diabetes Father   . Hyperlipidemia Father   . Hypertension Father   . Heart attack Father   . Peripheral vascular disease Father   . Other  Father     amputation  . Diabetes Sister   . Hyperlipidemia Sister   . Hypertension Sister   . Deep vein thrombosis Brother   . Hyperlipidemia Brother   . Hypertension Brother    Social History  Substance Use Topics  . Smoking status: Never Smoker   . Smokeless tobacco: Never Used  . Alcohol Use: No   OB History    No data available     Review of Systems  All other systems reviewed and are negative.     Allergies  Review of patient's allergies indicates no known allergies.  Home Medications   Prior to Admission medications   Medication Sig Start Date End Date Taking? Authorizing Provider  acetaminophen (TYLENOL) 500 MG tablet Take 500 mg by mouth every 6 (six) hours as needed for mild pain.   Yes Historical Provider, MD  ALPRAZolam Prudy Feeler(XANAX) 0.5 MG tablet Take 0.5 mg by mouth 2 (two) times daily.  07/17/13  Yes  Historical Provider, MD  Ascorbic Acid (VITAMIN C PO) Take 2 tablets by mouth every morning.    Yes Historical Provider, MD  atenolol (TENORMIN) 25 MG tablet Take 25 mg by mouth every morning.  06/11/13  Yes Historical Provider, MD  baclofen (LIORESAL) 10 MG tablet Take 1 tablet (10 mg total) by mouth every 8 (eight) hours. 12/07/13  Yes Lanney GinsMatthew Babish, PA-C  Biotin 5000 MCG CAPS Take 5,000 mcg by mouth daily.    Yes Historical Provider, MD  calcium carbonate (TUMS - DOSED IN MG ELEMENTAL CALCIUM) 500 MG chewable tablet Chew 4 tablets by mouth daily as needed for indigestion or heartburn.   Yes Historical Provider, MD  Calcium Carbonate-Vitamin D (CALCIUM-VITAMIN D) 500-200 MG-UNIT per tablet Take 2 tablets by mouth daily.   Yes Historical Provider, MD  CINNAMON PO Take 2,000 mg by mouth 2 (two) times daily.    Yes Historical Provider, MD  Collagen-Boron-Hyaluronic Acid ([employer] JOINT HEALTH TRIPLE ACTION) 10-5-3.3 MG TABS Take 1 tablet by mouth every morning.   Yes Historical Provider, MD  Cyanocobalamin (B-12 PO) Take 6,000 mcg by mouth daily.    Yes Historical Provider, MD  dexlansoprazole (DEXILANT) 60 MG capsule Take 60 mg by mouth daily.   Yes Historical Provider, MD  docusate sodium (COLACE) 100 MG capsule Take 100 mg by mouth 2 (two) times daily.   Yes Historical Provider, MD  furosemide (LASIX) 20 MG tablet Take 40 mg by mouth every morning.  05/28/13  Yes Historical Provider, MD  gabapentin (NEURONTIN) 600 MG tablet Take 600 mg by mouth 3 (three) times daily.  05/25/13  Yes Historical Provider, MD  HYDROcodone-acetaminophen (NORCO/VICODIN) 5-325 MG tablet Take 1 tablet by mouth every 6 (six) hours as needed for moderate pain.   Yes Historical Provider, MD  levothyroxine (SYNTHROID, LEVOTHROID) 50 MCG tablet Take 50 mcg by mouth every Monday, Wednesday, and Friday.   Yes Historical Provider, MD  OVER THE COUNTER MEDICATION Take 2 tablets by mouth daily. Multivitamin plus Omega 3 supplement   Yes  Historical Provider, MD  OVER THE COUNTER MEDICATION Apply 1 application topically 3 (three) times daily as needed. [place] Dream Cream- Muscle Spasms   Yes Historical Provider, MD  Propylene Glycol (SYSTANE BALANCE) 0.6 % SOLN Apply 1 drop to eye daily as needed (DRY EYES).   Yes Historical Provider, MD  ranitidine (ZANTAC) 150 MG tablet Take 150 mg by mouth daily as needed for heartburn.   Yes Historical Provider, MD  Rivaroxaban (XARELTO) 20 MG TABS  tablet Take 20 mg by mouth every evening. 08/11/13  Yes Larina Earthlyodd F Early, MD  simvastatin (ZOCOR) 20 MG tablet Take 20 mg by mouth every morning.  05/28/13  Yes Historical Provider, MD  levothyroxine (SYNTHROID, LEVOTHROID) 25 MCG tablet Take 25 mcg by mouth daily before breakfast.    Historical Provider, MD  Misc Natural Products (OSTEO BI-FLEX TRIPLE STRENGTH PO) Take 1 tablet by mouth 2 (two) times daily.    Historical Provider, MD  omeprazole (PRILOSEC) 20 MG capsule Take 20 mg by mouth daily.    Historical Provider, MD  sucralfate (CARAFATE) 1 g tablet Take 1 tablet (1 g total) by mouth 4 (four) times daily -  with meals and at bedtime. 04/13/16   Linwood DibblesJon Broady Lafoy, MD   BP 141/66 mmHg  Pulse 60  Temp(Src) 98.4 F (36.9 C) (Oral)  Resp 14  SpO2 99% Physical Exam  Constitutional: She appears well-developed and well-nourished. No distress.  HENT:  Head: Normocephalic and atraumatic.  Right Ear: External ear normal.  Left Ear: External ear normal.  Eyes: Conjunctivae are normal. Right eye exhibits no discharge. Left eye exhibits no discharge. No scleral icterus.  Neck: Neck supple. No tracheal deviation present.  Cardiovascular: Normal rate, regular rhythm and intact distal pulses.   Pulmonary/Chest: Effort normal and breath sounds normal. No stridor. No respiratory distress. She has no wheezes. She has no rales.  Abdominal: Soft. Bowel sounds are normal. She exhibits no distension. There is no tenderness. There is no rebound and no guarding.   Musculoskeletal: She exhibits edema (left leg greater than right, this is normal for her according to the patient). She exhibits no tenderness.  Neurological: She is alert. She has normal strength. No cranial nerve deficit (no facial droop, extraocular movements intact, no slurred speech) or sensory deficit. She exhibits normal muscle tone. She displays no seizure activity. Coordination normal.  Skin: Skin is warm and dry. No rash noted.  Psychiatric: She has a normal mood and affect.  Nursing note and vitals reviewed.   ED Course  Procedures (including critical care time) Labs Review Labs Reviewed  BASIC METABOLIC PANEL - Abnormal; Notable for the following:    Chloride 100 (*)    All other components within normal limits  PROTIME-INR - Abnormal; Notable for the following:    Prothrombin Time 16.7 (*)    All other components within normal limits  CBC  APTT  D-DIMER, QUANTITATIVE (NOT AT [facility])  I-STAT TROPOININ, ED  Rosezena SensorI-STAT TROPOININ, ED  Rosezena SensorI-STAT TROPOININ, ED    Imaging Review Dg Chest 2 View  04/13/2016  CLINICAL DATA:  Chest pain and weakness.  Hypertension EXAM: CHEST  2 VIEW COMPARISON:  May 16, 2014 FINDINGS: There is a nodular opacity at the left base which may represent a nipple shadow. There is mild scarring in this area as well. Lungs elsewhere clear. Heart size and pulmonary vascularity are normal. No adenopathy. There is degenerative change in the thoracic spine. There is a total shoulder replacement on the right. There is postoperative change in portions of the lower thoracic and upper lumbar spine. IMPRESSION: Nodular opacity overlying left base. Suspect nipple shadow; advise repeat study with nipple markers to further assess. Mild scarring left base. Lungs elsewhere clear. Stable cardiac silhouette. Electronically Signed   By: Bretta BangWilliam  Woodruff III M.D.   On: 04/13/2016 12:23   I have personally reviewed and evaluated these images and lab results as part of my medical  decision-making.   EKG Interpretation   Date/Time:  Tuesday April 13 2016 10:56:08 EDT Ventricular Rate:  65 PR Interval:  174 QRS Duration: 96 QT Interval:  372 QTC Calculation: 386 R Axis:   -33 Text Interpretation:  Normal sinus rhythm Left axis deviation Incomplete  right bundle branch block Moderate voltage criteria for LVH, may be normal  variant Abnormal ECG No significant change since last tracing Confirmed by  Demetria Lightsey  MD-J, Erminio Nygard ([id number]) on 04/13/2016 11:08:55 AM      MDM   Final diagnoses:  Chest pain with low risk for cardiac etiology    Heart score of 5.  Moderate RISK.   Pt states is active at home and these sx are not brought on by exertion.  She does have history of GERD.  Sx pretty constant today.  2 sets of cardiac enzymes negative.   I doubt acute coronary syndrome.  Will dc home with carafate,.   Follow up with PCP.  Consider further outpatient stress testing.    Linwood Dibbles, MD 04/13/16 [phone]

## 2016-04-13 NOTE — ED Notes (Signed)
This RN and Evangeline GulaKaren RN attempted for IV access without success.

## 2016-04-13 NOTE — ED Notes (Signed)
Pt endorses central chest pain that radiates under left breast. Pt endorses chest tightness 8/10 intermittently throughout yesterday. Patient stated she started to feel lightheaded or dizzy. She took zantac and 8 tums and went to bed thinking it was related to her GERD. Her chest pain woke her up from sleep this morning and patient felt flushed. She checked her BP and HR and noted she was hypertensive and bradycardic. Patient in NAD at present time, skin warm and dry, able to speak full sentences without difficulty.

## 2016-04-14 DIAGNOSIS — K219 Gastro-esophageal reflux disease without esophagitis: Secondary | ICD-10-CM | POA: Diagnosis not present

## 2016-04-14 DIAGNOSIS — I10 Essential (primary) hypertension: Secondary | ICD-10-CM | POA: Diagnosis not present

## 2016-04-14 DIAGNOSIS — R0789 Other chest pain: Secondary | ICD-10-CM | POA: Diagnosis not present

## 2016-04-14 DIAGNOSIS — I2699 Other pulmonary embolism without acute cor pulmonale: Secondary | ICD-10-CM | POA: Diagnosis not present

## 2016-04-14 DIAGNOSIS — Z6841 Body Mass Index (BMI) 40.0 and over, adult: Secondary | ICD-10-CM | POA: Diagnosis not present

## 2016-05-13 DIAGNOSIS — K219 Gastro-esophageal reflux disease without esophagitis: Secondary | ICD-10-CM | POA: Diagnosis not present

## 2016-05-13 DIAGNOSIS — Z86718 Personal history of other venous thrombosis and embolism: Secondary | ICD-10-CM | POA: Diagnosis not present

## 2016-05-13 DIAGNOSIS — Z7901 Long term (current) use of anticoagulants: Secondary | ICD-10-CM | POA: Diagnosis not present

## 2016-05-13 DIAGNOSIS — Z95828 Presence of other vascular implants and grafts: Secondary | ICD-10-CM | POA: Diagnosis not present

## 2016-05-13 DIAGNOSIS — R079 Chest pain, unspecified: Secondary | ICD-10-CM | POA: Diagnosis not present

## 2016-05-19 ENCOUNTER — Other Ambulatory Visit: Payer: Self-pay | Admitting: Gastroenterology

## 2016-05-19 DIAGNOSIS — K317 Polyp of stomach and duodenum: Secondary | ICD-10-CM | POA: Diagnosis not present

## 2016-05-19 DIAGNOSIS — R12 Heartburn: Secondary | ICD-10-CM | POA: Diagnosis not present

## 2016-05-19 DIAGNOSIS — K219 Gastro-esophageal reflux disease without esophagitis: Secondary | ICD-10-CM | POA: Diagnosis not present

## 2016-06-22 DIAGNOSIS — Z86718 Personal history of other venous thrombosis and embolism: Secondary | ICD-10-CM | POA: Diagnosis not present

## 2016-06-22 DIAGNOSIS — K219 Gastro-esophageal reflux disease without esophagitis: Secondary | ICD-10-CM | POA: Diagnosis not present

## 2016-06-22 DIAGNOSIS — Z7901 Long term (current) use of anticoagulants: Secondary | ICD-10-CM | POA: Diagnosis not present

## 2016-06-22 DIAGNOSIS — Z95828 Presence of other vascular implants and grafts: Secondary | ICD-10-CM | POA: Diagnosis not present

## 2016-06-24 DIAGNOSIS — Z961 Presence of intraocular lens: Secondary | ICD-10-CM | POA: Diagnosis not present

## 2016-07-05 DIAGNOSIS — R8299 Other abnormal findings in urine: Secondary | ICD-10-CM | POA: Diagnosis not present

## 2016-07-05 DIAGNOSIS — E7800 Pure hypercholesterolemia, unspecified: Secondary | ICD-10-CM | POA: Diagnosis not present

## 2016-07-05 DIAGNOSIS — M859 Disorder of bone density and structure, unspecified: Secondary | ICD-10-CM | POA: Diagnosis not present

## 2016-07-05 DIAGNOSIS — E038 Other specified hypothyroidism: Secondary | ICD-10-CM | POA: Diagnosis not present

## 2016-07-05 DIAGNOSIS — I10 Essential (primary) hypertension: Secondary | ICD-10-CM | POA: Diagnosis not present

## 2016-07-05 DIAGNOSIS — N390 Urinary tract infection, site not specified: Secondary | ICD-10-CM | POA: Diagnosis not present

## 2016-07-09 ENCOUNTER — Encounter: Payer: Self-pay | Admitting: Interventional Cardiology

## 2016-07-12 DIAGNOSIS — Z7901 Long term (current) use of anticoagulants: Secondary | ICD-10-CM | POA: Diagnosis not present

## 2016-07-12 DIAGNOSIS — M5489 Other dorsalgia: Secondary | ICD-10-CM | POA: Diagnosis not present

## 2016-07-12 DIAGNOSIS — F1320 Sedative, hypnotic or anxiolytic dependence, uncomplicated: Secondary | ICD-10-CM | POA: Diagnosis not present

## 2016-07-12 DIAGNOSIS — I517 Cardiomegaly: Secondary | ICD-10-CM | POA: Diagnosis not present

## 2016-07-12 DIAGNOSIS — Z6841 Body Mass Index (BMI) 40.0 and over, adult: Secondary | ICD-10-CM | POA: Diagnosis not present

## 2016-07-12 DIAGNOSIS — R0789 Other chest pain: Secondary | ICD-10-CM | POA: Diagnosis not present

## 2016-07-12 DIAGNOSIS — Z0000 Encounter for general adult medical examination without abnormal findings: Secondary | ICD-10-CM | POA: Diagnosis not present

## 2016-07-12 DIAGNOSIS — R2689 Other abnormalities of gait and mobility: Secondary | ICD-10-CM | POA: Diagnosis not present

## 2016-07-12 DIAGNOSIS — Z23 Encounter for immunization: Secondary | ICD-10-CM | POA: Diagnosis not present

## 2016-07-12 DIAGNOSIS — F1120 Opioid dependence, uncomplicated: Secondary | ICD-10-CM | POA: Diagnosis not present

## 2016-07-12 DIAGNOSIS — M859 Disorder of bone density and structure, unspecified: Secondary | ICD-10-CM | POA: Diagnosis not present

## 2016-07-13 DIAGNOSIS — Z1212 Encounter for screening for malignant neoplasm of rectum: Secondary | ICD-10-CM | POA: Diagnosis not present

## 2016-07-27 ENCOUNTER — Encounter: Payer: Self-pay | Admitting: Interventional Cardiology

## 2016-07-27 ENCOUNTER — Encounter (INDEPENDENT_AMBULATORY_CARE_PROVIDER_SITE_OTHER): Payer: Self-pay

## 2016-07-27 ENCOUNTER — Ambulatory Visit (INDEPENDENT_AMBULATORY_CARE_PROVIDER_SITE_OTHER): Payer: PPO | Admitting: Interventional Cardiology

## 2016-07-27 VITALS — BP 157/91 | HR 61 | Ht 63.0 in | Wt 268.4 lb

## 2016-07-27 DIAGNOSIS — G8929 Other chronic pain: Secondary | ICD-10-CM | POA: Insufficient documentation

## 2016-07-27 DIAGNOSIS — Z86711 Personal history of pulmonary embolism: Secondary | ICD-10-CM | POA: Diagnosis not present

## 2016-07-27 DIAGNOSIS — M545 Low back pain, unspecified: Secondary | ICD-10-CM | POA: Insufficient documentation

## 2016-07-27 DIAGNOSIS — R0789 Other chest pain: Secondary | ICD-10-CM

## 2016-07-27 DIAGNOSIS — Z7901 Long term (current) use of anticoagulants: Secondary | ICD-10-CM | POA: Diagnosis not present

## 2016-07-27 DIAGNOSIS — Z86718 Personal history of other venous thrombosis and embolism: Secondary | ICD-10-CM | POA: Insufficient documentation

## 2016-07-27 DIAGNOSIS — N183 Chronic kidney disease, stage 3 unspecified: Secondary | ICD-10-CM | POA: Insufficient documentation

## 2016-07-27 DIAGNOSIS — K449 Diaphragmatic hernia without obstruction or gangrene: Secondary | ICD-10-CM | POA: Insufficient documentation

## 2016-07-27 DIAGNOSIS — E039 Hypothyroidism, unspecified: Secondary | ICD-10-CM | POA: Insufficient documentation

## 2016-07-27 DIAGNOSIS — E785 Hyperlipidemia, unspecified: Secondary | ICD-10-CM | POA: Insufficient documentation

## 2016-07-27 DIAGNOSIS — N182 Chronic kidney disease, stage 2 (mild): Secondary | ICD-10-CM | POA: Insufficient documentation

## 2016-07-27 DIAGNOSIS — I2699 Other pulmonary embolism without acute cor pulmonale: Secondary | ICD-10-CM | POA: Insufficient documentation

## 2016-07-27 NOTE — Patient Instructions (Signed)
Medication Instructions:  None  Labwork: None  Testing/Procedures: Your physician has requested that you have a lexiscan myoview. For further information please visit [email or web address]. Please follow instruction sheet, as given.   Follow-Up: Your physician recommends that you schedule a follow-up appointment as needed with Dr. Smith.   Any Other Special Instructions Will Be Listed Below (If Applicable).     If you need a refill on your cardiac medications before your next appointment, please call your pharmacy.   

## 2016-07-27 NOTE — Progress Notes (Signed)
Cardiology Office Note    Date:  07/27/2016   ID:  [name], DOB [birth year], MRN [id number]  PCP:  Gwen Pounds, MD  Cardiologist: Lesleigh Noe, MD   Chief Complaint  Patient presents with  . Chest Pain    History of Present Illness:  [name] is a 70 y.o. female with history of recurrent PE, inferior vena caval filter, recurrent DVT, hiatal hernia, who is being referred for evaluation of chest pain.  She complains of 2 types of chest discomfort. The first is what she calls "indigestion". She has been evaluated by GI, Dr. Carman Ching, and was told she has a hiatal hernia but would not be a surgical candidate because of her history of PE. This discomfort is described as a sharp discomfort in the midsternal area that can last hours. He seems to be relieved by belching. He can occur up to 2-3 hours after eating.  The second type of discomfort is subcostal, it is not positional. The discomfort is quite significant. Is no radiation or associated diaphoresis. A bandlike feeling around the lower chest. She also feels that belching can help to relieve the discomfort. One episode was so severe in June that she went to the emergency room. Evaluation in the emergency room found no evidence of infarction and the EKG did not reveal evidence of ischemia.  Past Medical History:  Diagnosis Date  . Anemia   . Arthritis    osteoarthritis  . Complication of anesthesia    hard time waking up after her gallbladder surgery 28 years ago  . DVT (deep venous thrombosis) (HCC)    bilateral lungs  . GERD (gastroesophageal reflux disease)    hx of  . Heart murmur   . History of kidney stones   . Hypercholesteremia    under control  . Hypertension   . Hypothyroidism   . Peripheral vascular disease (HCC)   . PONV (postoperative nausea and vomiting)     Past Surgical History:  Procedure Laterality Date  . ABDOMINAL HYSTERECTOMY  1975   partial  . APPENDECTOMY  1973  . BACK  SURGERY     X14 1987-2009  . CARDIAC CATHETERIZATION  ~2010  . CARPAL TUNNEL RELEASE Bilateral   . CHOLECYSTECTOMY  1997  . COLONOSCOPY    . DILATION AND CURETTAGE OF UTERUS    . ENDARTERECTOMY Right 08/10/2013   Procedure: Excision of Venous Aneurysm Right Neck;  Surgeon: Larina Earthly, MD;  Location: [facility] OR;  Service: Vascular;  Laterality: Right;  . filter Left 2009   IVC  . HIP SURGERY Left 2008  . JOINT REPLACEMENT Right 11/2003   (knee)multiple surgeries  . JOINT REPLACEMENT Left 12/2009   (knee)  . JOINT REPLACEMENT Left 2011, 2012   Hip  . MASS EXCISION  1974   uterus  . TONSILLECTOMY  1986  . TOTAL HIP REVISION Left 12/07/2013   Procedure:  REVISION  CONSTRAINED LINER LEFT TOTAL HIP ;  Surgeon: Shelda Pal, MD;  Location: [facility] ORS;  Service: Orthopedics;  Laterality: Left;  . TOTAL SHOULDER REPLACEMENT Right 11/30/2010  . TUBAL LIGATION  1973    Current Medications: Outpatient Medications Prior to Visit  Medication Sig Dispense Refill  . ALPRAZolam (XANAX) 0.5 MG tablet Take 0.5 mg by mouth 2 (two) times daily.     . Ascorbic Acid (VITAMIN C PO) Take 2 tablets by mouth every morning.     Marland Kitchen atenolol (TENORMIN) 25 MG tablet Take  25 mg by mouth every morning.     . baclofen (LIORESAL) 10 MG tablet Take 1 tablet (10 mg total) by mouth every 8 (eight) hours. 30 each 0  . Biotin 5000 MCG CAPS Take 5,000 mcg by mouth daily.     . calcium carbonate (TUMS - DOSED IN MG ELEMENTAL CALCIUM) 500 MG chewable tablet Chew 4 tablets by mouth daily as needed for indigestion or heartburn.    . Calcium Carbonate-Vitamin D (CALCIUM-VITAMIN D) 500-200 MG-UNIT per tablet Take 2 tablets by mouth daily.    Marland Kitchen. CINNAMON PO Take 2,000 mg by mouth 2 (two) times daily.     . Collagen-Boron-Hyaluronic Acid ([employer] JOINT HEALTH TRIPLE ACTION) 10-5-3.3 MG TABS Take 1 tablet by mouth every morning.    . Cyanocobalamin (B-12 PO) Take 6,000 mcg by mouth daily.     Marland Kitchen. dexlansoprazole (DEXILANT) 60 MG capsule  Take 60 mg by mouth daily.    Marland Kitchen. docusate sodium (COLACE) 100 MG capsule Take 100 mg by mouth 2 (two) times daily.    . furosemide (LASIX) 20 MG tablet Take 40 mg by mouth every morning.     . gabapentin (NEURONTIN) 600 MG tablet Take 600 mg by mouth 3 (three) times daily.     Marland Kitchen. HYDROcodone-acetaminophen (NORCO/VICODIN) 5-325 MG tablet Take 1 tablet by mouth every 6 (six) hours as needed for moderate pain.    Marland Kitchen. OVER THE COUNTER MEDICATION Take 2 tablets by mouth daily. Multivitamin plus Omega 3 supplement    . OVER THE COUNTER MEDICATION Apply 1 application topically 3 (three) times daily as needed (joint pain). Australian Dream Cream- Muscle Spasms     . Propylene Glycol (SYSTANE BALANCE) 0.6 % SOLN Apply 1 drop to eye daily as needed (DRY EYES).    . Rivaroxaban (XARELTO) 20 MG TABS tablet Take 20 mg by mouth every evening.    . simvastatin (ZOCOR) 20 MG tablet Take 20 mg by mouth every morning.     . sucralfate (CARAFATE) 1 g tablet Take 1 tablet (1 g total) by mouth 4 (four) times daily -  with meals and at bedtime. 28 tablet 0  . acetaminophen (TYLENOL) 500 MG tablet Take 500 mg by mouth every 6 (six) hours as needed for mild pain.    Marland Kitchen. levothyroxine (SYNTHROID, LEVOTHROID) 25 MCG tablet Take 25 mcg by mouth daily before breakfast.    . levothyroxine (SYNTHROID, LEVOTHROID) 50 MCG tablet Take 50 mcg by mouth every Monday, Wednesday, and Friday.    . Misc Natural Products (OSTEO BI-FLEX TRIPLE STRENGTH PO) Take 1 tablet by mouth 2 (two) times daily.    Marland Kitchen. omeprazole (PRILOSEC) 20 MG capsule Take 20 mg by mouth daily.    . ranitidine (ZANTAC) 150 MG tablet Take 150 mg by mouth daily as needed for heartburn.     No facility-administered medications prior to visit.      Allergies:   Celecoxib   Social History   Social History  . Marital status: Married    Spouse name: N/A  . Number of children: N/A  . Years of education: N/A   Social History Main Topics  . Smoking status: Never Smoker    . Smokeless tobacco: Never Used  . Alcohol use No  . Drug use: No  . Sexual activity: Not Asked   Other Topics Concern  . None   Social History Narrative  . None     Family History:  The patient's family history includes Deep vein thrombosis in  her brother; Diabetes in her father, mother, and sister; Heart attack in her father; Heart disease in her mother; Hyperlipidemia in her brother, father, mother, and sister; Hypertension in her brother, father, mother, and sister; Other in her father; Peripheral vascular disease in her father.   ROS:   Please see the history of present illness.    Anxiety about a lot of family issues. Her brother has bipolar area she has been trying to help him but he requires too much emotional support. Cherie has a history of anxiety. She doesn't smoke.  All other systems reviewed and are negative.   PHYSICAL EXAM:   VS:  BP (!) 157/91 (BP Location: Right Arm)   Pulse 61   Ht 5\' 3"  (1.6 m)   Wt 268 lb 6.4 oz (121.7 kg)   BMI 47.54 kg/m    GEN: Well nourished, well developed, in no acute distress  HEENT: normal  Neck: no JVD, carotid bruits, or masses Cardiac: RRR; no murmurs, rubs, or gallops,no edema  Respiratory:  clear to auscultation bilaterally, normal work of breathing GI: soft, nontender, nondistended, + BS MS: no deformity or atrophy  Skin: warm and dry, no rash Neuro:  Alert and Oriented x 3, Strength and sensation are intact Psych: euthymic mood, full affect  Wt Readings from Last 3 Encounters:  07/27/16 268 lb 6.4 oz (121.7 kg)  12/07/13 255 lb (115.7 kg)  12/03/13 255 lb (115.7 kg)      Studies/Labs Reviewed:   EKG:  EKG  Normal sinus rhythm, left axis deviation, poor R-wave progression, 2015. No change when compared to the prior tracing. The images were personally reviewed.  Recent Labs: 04/13/2016: BUN 13; Creatinine, Ser 0.94; Hemoglobin 12.5; Platelets 255; Potassium 4.3; Sodium 138   Lipid Panel No results found for:  CHOL, TRIG, HDL, CHOLHDL, VLDL, LDLCALC, LDLDIRECT  Additional studies/ records that were reviewed today include:  Echocardiogram performed in 2014 demonstrated aortic valve sclerosis. LV function was normal. Right heart was normal. Pericardium and pericardial space were normal. The images were personally reviewed.    ASSESSMENT:    1. Chest discomfort   2. History of pulmonary embolism   3. Hiatal hernia   4. Chronic anticoagulation      PLAN:  In order of problems listed above:  1. Symptoms are atypical but with a very strong family history, her age, obesity, history of hypertension, we must exclude myocardial ischemia as a source of the chest pain episodes. A Lexiscan Myoview myocardial perfusion study will be performed. 2. She has an inferior vena caval filter and is on chronic anticoagulation therapy. Recurrent PE is highly unlikely. 3. This could be causing much of the complaints noted above. No further evaluation or treatment recommendations other than as instructed by her gastroenterologist.    Medication Adjustments/Labs and Tests Ordered: Current medicines are reviewed at length with the patient today.  Concerns regarding medicines are outlined above.  Medication changes, Labs and Tests ordered today are listed in the Patient Instructions below. Patient Instructions  Medication Instructions:  None  Labwork: None  Testing/Procedures: Your physician has requested that you have a lexiscan myoview. For further information please visit [email or web address] Please follow instruction sheet, as given.   Follow-Up: Your physician recommends that you schedule a follow-up appointment as needed with Dr. Katrinka Blazing.   Any Other Special Instructions Will Be Listed Below (If Applicable).     If you need a refill on your cardiac medications before your next appointment, please call your pharmacy.  Signed, Lesleigh Noe, MD  07/27/2016 4:33 PM    [facility] [address] [place], [place], [place]  [address] Phone: [phone]; Fax: [phone]

## 2016-07-29 ENCOUNTER — Telehealth (HOSPITAL_COMMUNITY): Payer: Self-pay | Admitting: *Deleted

## 2016-07-29 NOTE — Telephone Encounter (Signed)
Patient given detailed instructions per Myocardial Perfusion Study Information Sheet for the test on 08/02/16. Patient notified to arrive 15 minutes early and that it is imperative to arrive on time for appointment to keep from having the test rescheduled.  If you need to cancel or reschedule your appointment, please call the office within 24 hours of your appointment. Failure to do so may result in a cancellation of your appointment, and a $50 no show fee. Patient verbalized understanding. Jeliyah Middlebrooks Jacqueline    

## 2016-08-02 ENCOUNTER — Ambulatory Visit (HOSPITAL_COMMUNITY): Payer: PPO

## 2016-08-03 ENCOUNTER — Ambulatory Visit (HOSPITAL_COMMUNITY): Payer: PPO | Attending: Cardiology

## 2016-08-03 DIAGNOSIS — R0789 Other chest pain: Secondary | ICD-10-CM | POA: Insufficient documentation

## 2016-08-03 MED ORDER — TECHNETIUM TC 99M TETROFOSMIN IV KIT
33.0000 | PACK | Freq: Once | INTRAVENOUS | Status: AC | PRN
  Administered 2016-08-03: 33 via INTRAVENOUS
  Filled 2016-08-03: qty 33

## 2016-08-03 MED ORDER — REGADENOSON 0.4 MG/5ML IV SOLN
0.4000 mg | Freq: Once | INTRAVENOUS | Status: AC
  Administered 2016-08-03: 0.4 mg via INTRAVENOUS

## 2016-08-04 ENCOUNTER — Encounter: Payer: Self-pay | Admitting: Interventional Cardiology

## 2016-08-05 ENCOUNTER — Ambulatory Visit (HOSPITAL_COMMUNITY): Payer: PPO | Attending: Cardiovascular Disease

## 2016-08-05 LAB — MYOCARDIAL PERFUSION IMAGING
CHL CUP RESTING HR STRESS: 56 {beats}/min
LV dias vol: 93 mL (ref 46–106)
LVSYSVOL: 34 mL
NUC STRESS TID: 0.9
Peak HR: 82 {beats}/min
RATE: 0.38
SDS: 3
SRS: 6
SSS: 9

## 2016-08-05 MED ORDER — TECHNETIUM TC 99M TETROFOSMIN IV KIT
32.7000 | PACK | Freq: Once | INTRAVENOUS | Status: AC | PRN
  Administered 2016-08-05: 32.7 via INTRAVENOUS
  Filled 2016-08-05: qty 33

## 2016-09-16 DIAGNOSIS — M25512 Pain in left shoulder: Secondary | ICD-10-CM | POA: Diagnosis not present

## 2016-09-16 DIAGNOSIS — Z96642 Presence of left artificial hip joint: Secondary | ICD-10-CM | POA: Diagnosis not present

## 2016-09-16 DIAGNOSIS — Z96652 Presence of left artificial knee joint: Secondary | ICD-10-CM | POA: Diagnosis not present

## 2016-09-16 DIAGNOSIS — Z96651 Presence of right artificial knee joint: Secondary | ICD-10-CM | POA: Diagnosis not present

## 2016-09-16 DIAGNOSIS — Z471 Aftercare following joint replacement surgery: Secondary | ICD-10-CM | POA: Diagnosis not present

## 2016-09-16 DIAGNOSIS — M25552 Pain in left hip: Secondary | ICD-10-CM | POA: Diagnosis not present

## 2016-10-19 DIAGNOSIS — R300 Dysuria: Secondary | ICD-10-CM | POA: Diagnosis not present

## 2016-10-19 DIAGNOSIS — N390 Urinary tract infection, site not specified: Secondary | ICD-10-CM | POA: Diagnosis not present

## 2016-10-19 DIAGNOSIS — Z6841 Body Mass Index (BMI) 40.0 and over, adult: Secondary | ICD-10-CM | POA: Diagnosis not present

## 2016-10-19 DIAGNOSIS — Z1231 Encounter for screening mammogram for malignant neoplasm of breast: Secondary | ICD-10-CM | POA: Diagnosis not present

## 2016-10-19 DIAGNOSIS — Z01419 Encounter for gynecological examination (general) (routine) without abnormal findings: Secondary | ICD-10-CM | POA: Diagnosis not present

## 2016-10-28 DIAGNOSIS — M25552 Pain in left hip: Secondary | ICD-10-CM | POA: Diagnosis not present

## 2016-10-28 DIAGNOSIS — M7062 Trochanteric bursitis, left hip: Secondary | ICD-10-CM | POA: Diagnosis not present

## 2016-10-28 DIAGNOSIS — S76111S Strain of right quadriceps muscle, fascia and tendon, sequela: Secondary | ICD-10-CM | POA: Diagnosis not present

## 2016-10-28 DIAGNOSIS — M25512 Pain in left shoulder: Secondary | ICD-10-CM | POA: Diagnosis not present

## 2016-11-30 NOTE — Patient Instructions (Signed)
[name]  11/30/2016   Your procedure is scheduled on: 12/06/16  [facility]  [place] to 3rd floor to  Short Stay Center at    0530 AM.  Call this number if you have problems the morning of surgery [phone]   Remember: ONLY 1 PERSON MAY GO WITH YOU TO SHORT STAY TO GET  READY MORNING OF YOUR SURGERY.  Do not eat food or drink liquids :After Midnight.     Take these medicines the morning of surgery with A SIP OF WATER: ATENELOL,GABAPENTIN, SYNTHROID                                You may not have any metal on your body including hair pins and              piercings  Do not wear jewelry, make-up, lotions, powders or perfumes, deodorant             Do not wear nail polish.  Do not shave  48 hours prior to surgery.              Do not bring valuables to the hospital. [facility] IS NOT             RESPONSIBLE   FOR VALUABLES.  Contacts, dentures or bridgework may not be worn into surgery.  Leave suitcase in the car. After surgery it may be brought to your room.                  Please read over the following fact sheets you were given: _____________________________________________________________________             [facility] - Preparing for Surgery Before surgery, you can play an important role.  Because skin is not sterile, your skin needs to be as free of germs as possible.  You can reduce the number of germs on your skin by washing with CHG (chlorahexidine gluconate) soap before surgery.  CHG is an antiseptic cleaner which kills germs and bonds with the skin to continue killing germs even after washing. Please DO NOT use if you have an allergy to CHG or antibacterial soaps.  If your skin becomes reddened/irritated stop using the CHG and inform your nurse when you arrive at Short Stay. Do not shave (including legs and underarms) for at least 48 hours prior to the first CHG shower.  You may shave your  face/neck. Please follow these instructions carefully:  1.  Shower with CHG Soap the night before surgery and the  morning of Surgery.  2.  If you choose to wash your hair, wash your hair first as usual with your  normal  shampoo.  3.  After you shampoo, rinse your hair and body thoroughly to remove the  shampoo.                           4.  Use CHG as you would any other liquid soap.  You can apply chg directly  to the skin and wash                       Gently with a scrungie or clean washcloth.  5.  Apply the CHG Soap to your body ONLY  FROM THE NECK DOWN.   Do not use on face/ open                           Wound or open sores. Avoid contact with eyes, ears mouth and genitals (private parts).                       Wash face,  Genitals (private parts) with your normal soap.             6.  Wash thoroughly, paying special attention to the area where your surgery  will be performed.  7.  Thoroughly rinse your body with warm water from the neck down.  8.  DO NOT shower/wash with your normal soap after using and rinsing off  the CHG Soap.                9.  Pat yourself dry with a clean towel.            10.  Wear clean pajamas.            11.  Place clean sheets on your bed the night of your first shower and do not  sleep with pets. Day of Surgery : Do not apply any lotions/deodorants the morning of surgery.  Please wear clean clothes to the hospital/surgery center.  FAILURE TO FOLLOW THESE INSTRUCTIONS MAY RESULT IN THE CANCELLATION OF YOUR SURGERY PATIENT SIGNATURE_________________________________  NURSE SIGNATURE__________________________________  ________________________________________________________________________  WHAT IS A BLOOD TRANSFUSION? Blood Transfusion Information  A transfusion is the replacement of blood or some of its parts. Blood is made up of multiple cells which provide different functions.  Red blood cells carry oxygen and are used for blood loss  replacement.  White blood cells fight against infection.  Platelets control bleeding.  Plasma helps clot blood.  Other blood products are available for specialized needs, such as hemophilia or other clotting disorders. BEFORE THE TRANSFUSION  Who gives blood for transfusions?   Healthy volunteers who are fully evaluated to make sure their blood is safe. This is blood bank blood. Transfusion therapy is the safest it has ever been in the practice of medicine. Before blood is taken from a donor, a complete history is taken to make sure that person has no history of diseases nor engages in risky social behavior (examples are intravenous drug use or sexual activity with multiple partners). The donor's travel history is screened to minimize risk of transmitting infections, such as malaria. The donated blood is tested for signs of infectious diseases, such as HIV and hepatitis. The blood is then tested to be sure it is compatible with you in order to minimize the chance of a transfusion reaction. If you or a relative donates blood, this is often done in anticipation of surgery and is not appropriate for emergency situations. It takes many days to process the donated blood. RISKS AND COMPLICATIONS Although transfusion therapy is very safe and saves many lives, the main dangers of transfusion include:   Getting an infectious disease.  Developing a transfusion reaction. This is an allergic reaction to something in the blood you were given. Every precaution is taken to prevent this. The decision to have a blood transfusion has been considered carefully by your caregiver before blood is given. Blood is not given unless the benefits outweigh the risks. AFTER THE TRANSFUSION  Right after receiving a blood transfusion, you will usually feel much better  and more energetic. This is especially true if your red blood cells have gotten low (anemic). The transfusion raises the level of the red blood cells which  carry oxygen, and this usually causes an energy increase.  The nurse administering the transfusion will monitor you carefully for complications. HOME CARE INSTRUCTIONS  No special instructions are needed after a transfusion. You may find your energy is better. Speak with your caregiver about any limitations on activity for underlying diseases you may have. SEEK MEDICAL CARE IF:   Your condition is not improving after your transfusion.  You develop redness or irritation at the intravenous (IV) site. SEEK IMMEDIATE MEDICAL CARE IF:  Any of the following symptoms occur over the next 12 hours:  Shaking chills.  You have a temperature by mouth above 102 F (38.9 C), not controlled by medicine.  Chest, back, or muscle pain.  People around you feel you are not acting correctly or are confused.  Shortness of breath or difficulty breathing.  Dizziness and fainting.  You get a rash or develop hives.  You have a decrease in urine output.  Your urine turns a dark color or changes to pink, red, or brown. Any of the following symptoms occur over the next 10 days:  You have a temperature by mouth above 102 F (38.9 C), not controlled by medicine.  Shortness of breath.  Weakness after normal activity.  The white part of the eye turns yellow (jaundice).  You have a decrease in the amount of urine or are urinating less often.  Your urine turns a dark color or changes to pink, red, or brown. Document Released: 10/01/2000 Document Revised: 12/27/2011 Document Reviewed: 05/20/2008 ExitCare Patient Information 2014 [place].  _______________________________________________________________________  Incentive Spirometer  An incentive spirometer is a tool that can help keep your lungs clear and active. This tool measures how well you are filling your lungs with each breath. Taking long deep breaths may help reverse or decrease the chance of developing breathing (pulmonary) problems  (especially infection) following:  A long period of time when you are unable to move or be active. BEFORE THE PROCEDURE   If the spirometer includes an indicator to show your best effort, your nurse or respiratory therapist will set it to a desired goal.  If possible, sit up straight or lean slightly forward. Try not to slouch.  Hold the incentive spirometer in an upright position. INSTRUCTIONS FOR USE  1. Sit on the edge of your bed if possible, or sit up as far as you can in bed or on a chair. 2. Hold the incentive spirometer in an upright position. 3. Breathe out normally. 4. Place the mouthpiece in your mouth and seal your lips tightly around it. 5. Breathe in slowly and as deeply as possible, raising the piston or the ball toward the top of the column. 6. Hold your breath for 3-5 seconds or for as long as possible. Allow the piston or ball to fall to the bottom of the column. 7. Remove the mouthpiece from your mouth and breathe out normally. 8. Rest for a few seconds and repeat Steps 1 through 7 at least 10 times every 1-2 hours when you are awake. Take your time and take a few normal breaths between deep breaths. 9. The spirometer may include an indicator to show your best effort. Use the indicator as a goal to work toward during each repetition. 10. After each set of 10 deep breaths, practice coughing to be sure your  lungs are clear. If you have an incision (the cut made at the time of surgery), support your incision when coughing by placing a pillow or rolled up towels firmly against it. Once you are able to get out of bed, walk around indoors and cough well. You may stop using the incentive spirometer when instructed by your caregiver.  RISKS AND COMPLICATIONS  Take your time so you do not get dizzy or light-headed.  If you are in pain, you may need to take or ask for pain medication before doing incentive spirometry. It is harder to take a deep breath if you are having  pain. AFTER USE  Rest and breathe slowly and easily.  It can be helpful to keep track of a log of your progress. Your caregiver can provide you with a simple table to help with this. If you are using the spirometer at home, follow these instructions: [place] IF:   You are having difficultly using the spirometer.  You have trouble using the spirometer as often as instructed.  Your pain medication is not giving enough relief while using the spirometer.  You develop fever of 100.5 F (38.1 C) or higher. SEEK IMMEDIATE MEDICAL CARE IF:   You cough up bloody sputum that had not been present before.  You develop fever of 102 F (38.9 C) or greater.  You develop worsening pain at or near the incision site. MAKE SURE YOU:   Understand these instructions.  Will watch your condition.  Will get help right away if you are not doing well or get worse. Document Released: 02/14/2007 Document Revised: 12/27/2011 Document Reviewed: 04/17/2007 [facility] Patient Information 2014 [place], [place].   ________________________________________________________________________

## 2016-12-01 ENCOUNTER — Encounter (HOSPITAL_COMMUNITY): Payer: Self-pay

## 2016-12-01 ENCOUNTER — Encounter (HOSPITAL_COMMUNITY)
Admission: RE | Admit: 2016-12-01 | Discharge: 2016-12-01 | Disposition: A | Discharge status: Home / Self Care | Payer: PPO | Source: Ambulatory Visit | Attending: Orthopedic Surgery | Admitting: Orthopedic Surgery

## 2016-12-01 DIAGNOSIS — Z01812 Encounter for preprocedural laboratory examination: Secondary | ICD-10-CM | POA: Insufficient documentation

## 2016-12-01 HISTORY — DX: Personal history of other diseases of the digestive system: Z87.19

## 2016-12-01 HISTORY — DX: Personal history of other medical treatment: Z92.89

## 2016-12-01 LAB — CBC
HCT: 39.3 % (ref 36.0–46.0)
Hemoglobin: 12.5 g/dL (ref 12.0–15.0)
MCH: 28.2 pg (ref 26.0–34.0)
MCHC: 31.8 g/dL (ref 30.0–36.0)
MCV: 88.5 fL (ref 78.0–100.0)
PLATELETS: 268 10*3/uL (ref 150–400)
RBC: 4.44 MIL/uL (ref 3.87–5.11)
RDW: 14 % (ref 11.5–15.5)
WBC: 6.7 10*3/uL (ref 4.0–10.5)

## 2016-12-01 LAB — BASIC METABOLIC PANEL
ANION GAP: 8 (ref 5–15)
BUN: 17 mg/dL (ref 6–20)
CALCIUM: 10.1 mg/dL (ref 8.9–10.3)
CO2: 32 mmol/L (ref 22–32)
Chloride: 101 mmol/L (ref 101–111)
Creatinine, Ser: 0.95 mg/dL (ref 0.44–1.00)
GFR calc Af Amer: >60 mL/min (ref >60)
GFR, EST NON AFRICAN AMERICAN: 59 mL/min — AB (ref >60)
GLUCOSE: 100 mg/dL — AB (ref 65–99)
Potassium: 4.4 mmol/L (ref 3.5–5.1)
Sodium: 141 mmol/L (ref 135–145)

## 2016-12-01 NOTE — Progress Notes (Signed)
08/05/16 stress test Epic 07/12/16 clearance chart 04/15/16 EKG epic  cxr 04/13/16 epic

## 2016-12-02 NOTE — H&P (Signed)
[name] is an 71 y.o. female.    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 female complaining of right knee pain / quad failure.  Pain had continually increased since the beginning. X-rays in the clinic show failure of the quad tendon. Pt has tried various conservative treatments which have failed to alleviate their symptoms, including activity modification / assistance devices. Various options are discussed with the patient. Risks, benefits and expectations were discussed with the patient. Patient understand the risks, benefits and expectations and wishes to proceed with surgery.    PCP: Precious Reel, MD  D/C Plans:       Home   Post-op Meds:       No Rx given  Tranexamic Acid:      To be given - topically (hx of DVT)  Decadron:      Is to be given  FYI:     Xarelto  Norco    PMH: Past Medical History:  Diagnosis Date  . Anemia   . Arthritis    osteoarthritis  . Complication of anesthesia    hard time waking up after her gallbladder surgery 28 years ago  . DVT (deep venous thrombosis) (HCC)    bilateral lungs  . GERD (gastroesophageal reflux disease)    hx of  . Heart murmur   . History of blood transfusion   . History of hiatal hernia    small  . History of kidney stones   . Hypercholesteremia    under control  . Hypertension   . Hypothyroidism   . PONV (postoperative nausea and vomiting)     PSH: Past Surgical History:  Procedure Laterality Date  . ABDOMINAL HYSTERECTOMY  1975   partial  . APPENDECTOMY  1973  . BACK SURGERY     X14 1987-2009  . CARDIAC CATHETERIZATION  ~2010  . CARPAL TUNNEL RELEASE Bilateral   . CHOLECYSTECTOMY  1997  . COLONOSCOPY    . DILATION AND CURETTAGE OF UTERUS    . ENDARTERECTOMY Right 08/10/2013   Procedure: Excision of Venous Aneurysm Right Neck;  Surgeon: Rosetta Posner, MD;   Location: [facility];  Service: Vascular;  Laterality: Right;  . filter Left 2009   IVC  . HIP SURGERY Left 2008  . JOINT REPLACEMENT Right 11/2003   (knee)multiple surgeries  . JOINT REPLACEMENT Left 12/2009   (knee)  . JOINT REPLACEMENT Left 2011, 2012   Hip  . MASS EXCISION  1974   uterus  . TONSILLECTOMY  1986  . TOTAL HIP REVISION Left 12/07/2013   Procedure:  REVISION  CONSTRAINED LINER LEFT TOTAL HIP ;  Surgeon: Mauri Pole, MD;  Location: [facility];  Service: Orthopedics;  Laterality: Left;  . TOTAL SHOULDER REPLACEMENT Right 11/30/2010  . TUBAL LIGATION  1973    Social History:  reports that she has never smoked. She has never used smokeless tobacco. She reports that she does not drink alcohol or use drugs.  Allergies:  Allergies  Allergen Reactions  . Celecoxib Rash    Medications: No current facility-administered medications for this encounter.    Current Outpatient Prescriptions  Medication Sig Dispense Refill  . ALPRAZolam (XANAX) 0.5 MG tablet Take 0.5 mg by mouth 2 (two) times daily.     . Ascorbic Acid (VITAMIN C PO) Take 2 tablets by mouth daily. WILL  STOP PRIOR TO PROCEDURE    . atenolol (TENORMIN) 25 MG tablet Take 25 mg by mouth every morning.     . baclofen (LIORESAL) 10 MG tablet Take 1 tablet (10 mg total) by mouth every 8 (eight) hours. 30 each 0  . Biotin 5000 MCG CAPS Take 5,000 mcg by mouth daily. WILL STOP PRIOR TO PROCEDURE    . calcium carbonate (TUMS - DOSED IN MG ELEMENTAL CALCIUM) 500 MG chewable tablet Chew 4 tablets by mouth daily as needed for indigestion or heartburn.    . Calcium Carbonate-Vitamin D (CALCIUM-VITAMIN D) 500-200 MG-UNIT per tablet Take 2 tablets by mouth daily. WILL STOP PRIOR TO PROCEDURE    . CINNAMON PO Take 2,000 mg by mouth 2 (two) times daily. WILL STOP PRIOR TO PROCEDURE    . Collagen-Boron-Hyaluronic Acid ([employer] JOINT HEALTH TRIPLE ACTION) 10-5-3.3 MG TABS Take 1 tablet by mouth daily. WILL STOP PRIOR TO PROCEDURE    .  Cyanocobalamin (B-12 PO) Take 5,000 mcg by mouth daily. WILL STOP PRIOR TO PROCEDURE    . dexlansoprazole (DEXILANT) 60 MG capsule Take 60 mg by mouth daily.    Marland Kitchen docusate sodium (COLACE) 100 MG capsule Take 100 mg by mouth 2 (two) times daily.    . furosemide (LASIX) 20 MG tablet Take 40 mg by mouth every morning.     . gabapentin (NEURONTIN) 600 MG tablet Take 600 mg by mouth 3 (three) times daily.     Marland Kitchen HYDROcodone-acetaminophen (NORCO/VICODIN) 5-325 MG tablet Take 1 tablet by mouth every 6 (six) hours as needed for moderate pain.    Marland Kitchen levothyroxine (SYNTHROID, LEVOTHROID) 50 MCG tablet Take 25 mcg by mouth every Monday, Wednesday, and Friday.    . Omega-3 Fatty Acids (FISH OIL PO) Take 2 tablets by mouth daily. WILL STOP PRIOR TO PROCEDURE    . OVER THE COUNTER MEDICATION Take 2 tablets by mouth daily. Multivitamin plus Omega 3 supplement  WILL STOP PRIOR TO PROCEDURE    . OVER THE COUNTER MEDICATION Apply 1 application topically 3 (three) times daily as needed (joint pain). Australian Dream Cream- Muscle Spasms     . Rivaroxaban (XARELTO) 20 MG TABS tablet Take 20 mg by mouth every evening.    . simvastatin (ZOCOR) 20 MG tablet Take 20 mg by mouth every morning.     . sucralfate (CARAFATE) 1 g tablet Take 1 tablet (1 g total) by mouth 4 (four) times daily -  with meals and at bedtime. 28 tablet 0    Results for orders placed or performed during the hospital encounter of 12/01/16 (from the past 48 hour(s))  Type and screen Order type and screen if day of surgery is less than 15 days from draw of preadmission visit or order morning of surgery if day of surgery is greater than 6 days from preadmission visit.     Status: None   Collection Time: 12/01/16  1:45 PM  Result Value Ref Range   ABO/RH(D) A POS    Antibody Screen NEG    Sample Expiration 12/15/2016    Extend sample reason NO TRANSFUSIONS OR PREGNANCY IN THE PAST 3 MONTHS   Basic metabolic panel     Status: Abnormal   Collection  Time: 12/01/16  1:45 PM  Result Value Ref Range   Sodium 141 135 - 145 mmol/L   Potassium 4.4 3.5 - 5.1 mmol/L   Chloride 101 101 - 111 mmol/L   CO2 32 22 - 32 mmol/L   Glucose, Bld  100 (H) 65 - 99 mg/dL   BUN 17 6 - 20 mg/dL   Creatinine, Ser 0.95 0.44 - 1.00 mg/dL   Calcium 10.1 8.9 - 10.3 mg/dL   GFR calc non Af Amer 59 (L) >60 mL/min   GFR calc Af Amer >60 >60 mL/min    Comment: (NOTE) The eGFR has been calculated using the CKD EPI equation. This calculation has not been validated in all clinical situations. eGFR's persistently <60 mL/min signify possible Chronic Kidney Disease.    Anion gap 8 5 - 15  CBC     Status: None   Collection Time: 12/01/16  1:45 PM  Result Value Ref Range   WBC 6.7 4.0 - 10.5 K/uL   RBC 4.44 3.87 - 5.11 MIL/uL   Hemoglobin 12.5 12.0 - 15.0 g/dL   HCT 39.3 36.0 - 46.0 %   MCV 88.5 78.0 - 100.0 fL   MCH 28.2 26.0 - 34.0 pg   MCHC 31.8 30.0 - 36.0 g/dL   RDW 14.0 11.5 - 15.5 %   Platelets 268 150 - 400 K/uL    Review of Systems  Constitutional: Negative.   HENT: Negative.   Eyes: Negative.   Respiratory: Negative.   Cardiovascular: Negative.   Gastrointestinal: Negative.   Genitourinary: Negative.   Musculoskeletal: Positive for joint pain.  Skin: Negative.   Neurological: Negative.   Endo/Heme/Allergies: Negative.   Psychiatric/Behavioral: Negative.        Physical Exam  Constitutional: She is oriented to person, place, and time. She appears well-developed.  HENT:  Head: Normocephalic.  Eyes: Pupils are equal, round, and reactive to light.  Neck: Neck supple. No JVD present. No tracheal deviation present. No thyromegaly present.  Cardiovascular: Normal rate, regular rhythm and intact distal pulses.   Murmur heard. Respiratory: Effort normal and breath sounds normal. No respiratory distress. She has no wheezes.  GI: Soft. There is no tenderness. There is no guarding.  Musculoskeletal:       Right knee: She exhibits decreased  range of motion, swelling, deformity, laceration (healed previous incision), abnormal alignment, abnormal patellar mobility and bony tenderness. She exhibits no ecchymosis and no erythema. Tenderness found.  Lymphadenopathy:    She has no cervical adenopathy.  Neurological: She is alert and oriented to person, place, and time. A sensory deficit (numbness in left LE laterally) is present.  Skin: Skin is warm and dry.  Psychiatric: She has a normal mood and affect.       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 Alvan Dame at [facility]. Risks benefits and expectations were discussed with the patient. Patient understand risks, benefits and expectations and wishes to proceed.     [place] Pugh Taishawn Smaldone   PA-C  12/02/2016, 9:24 PM

## 2016-12-05 MED ORDER — CEFAZOLIN SODIUM 10 G IJ SOLR
3.0000 g | INTRAMUSCULAR | Status: AC
  Administered 2016-12-06: 3 g via INTRAVENOUS
  Filled 2016-12-05: qty 3000
  Filled 2016-12-05: qty 3

## 2016-12-05 NOTE — Anesthesia Preprocedure Evaluation (Addendum)
Anesthesia Evaluation  Patient identified by MRN, date of birth, ID band Patient awake    Reviewed: Allergy & Precautions, NPO status , Patient's Chart, lab work & pertinent test results  History of Anesthesia Complications (+) history of anesthetic complications  Airway Mallampati: II       Dental no notable dental hx.    Pulmonary    Pulmonary exam normal        Cardiovascular hypertension, Pt. on home beta blockers and Pt. on medications Normal cardiovascular exam     Neuro/Psych    GI/Hepatic GERD  Medicated and Controlled,  Endo/Other  Morbid obesity  Renal/GU      Musculoskeletal   Abdominal (+) + obese,   Peds  Hematology   Anesthesia Other Findings   Reproductive/Obstetrics                           Anesthesia Physical Anesthesia Plan  ASA: III  Anesthesia Plan: General   Post-op Pain Management:    Induction: Intravenous  Airway Management Planned: Oral ETT  Additional Equipment:   Intra-op Plan:   Post-operative Plan: Extubation in OR  Informed Consent: I have reviewed the patients History and Physical, chart, labs and discussed the procedure including the risks, benefits and alternatives for the proposed anesthesia with the patient or authorized representative who has indicated his/her understanding and acceptance.     Plan Discussed with: CRNA and Surgeon  Anesthesia Plan Comments:        Anesthesia Quick Evaluation

## 2016-12-06 ENCOUNTER — Encounter (HOSPITAL_COMMUNITY): Payer: Self-pay | Admitting: *Deleted

## 2016-12-06 ENCOUNTER — Observation Stay (HOSPITAL_COMMUNITY)
Admission: RE | Admit: 2016-12-06 | Discharge: 2016-12-07 | Disposition: A | Discharge status: Home / Self Care | Payer: PPO | Source: Ambulatory Visit | Attending: Orthopedic Surgery | Admitting: Orthopedic Surgery

## 2016-12-06 ENCOUNTER — Inpatient Hospital Stay (HOSPITAL_COMMUNITY): Payer: PPO | Admitting: Certified Registered Nurse Anesthetist

## 2016-12-06 ENCOUNTER — Encounter (HOSPITAL_COMMUNITY)
Admission: RE | Disposition: A | Discharge status: Home / Self Care | Payer: Self-pay | Source: Ambulatory Visit | Attending: Orthopedic Surgery

## 2016-12-06 DIAGNOSIS — S76111A Strain of right quadriceps muscle, fascia and tendon, initial encounter: Secondary | ICD-10-CM | POA: Diagnosis not present

## 2016-12-06 DIAGNOSIS — R079 Chest pain, unspecified: Secondary | ICD-10-CM | POA: Diagnosis not present

## 2016-12-06 DIAGNOSIS — Z87442 Personal history of urinary calculi: Secondary | ICD-10-CM | POA: Diagnosis not present

## 2016-12-06 DIAGNOSIS — E785 Hyperlipidemia, unspecified: Secondary | ICD-10-CM | POA: Diagnosis not present

## 2016-12-06 DIAGNOSIS — S76119A Strain of unspecified quadriceps muscle, fascia and tendon, initial encounter: Secondary | ICD-10-CM | POA: Diagnosis present

## 2016-12-06 DIAGNOSIS — E039 Hypothyroidism, unspecified: Secondary | ICD-10-CM | POA: Insufficient documentation

## 2016-12-06 DIAGNOSIS — K219 Gastro-esophageal reflux disease without esophagitis: Secondary | ICD-10-CM | POA: Insufficient documentation

## 2016-12-06 DIAGNOSIS — R011 Cardiac murmur, unspecified: Secondary | ICD-10-CM | POA: Diagnosis not present

## 2016-12-06 DIAGNOSIS — K449 Diaphragmatic hernia without obstruction or gangrene: Secondary | ICD-10-CM | POA: Diagnosis not present

## 2016-12-06 DIAGNOSIS — T84092A Other mechanical complication of internal right knee prosthesis, initial encounter: Secondary | ICD-10-CM | POA: Diagnosis not present

## 2016-12-06 DIAGNOSIS — E7800 Pure hypercholesterolemia, unspecified: Secondary | ICD-10-CM | POA: Diagnosis not present

## 2016-12-06 DIAGNOSIS — Z7901 Long term (current) use of anticoagulants: Secondary | ICD-10-CM | POA: Diagnosis not present

## 2016-12-06 DIAGNOSIS — Z888 Allergy status to other drugs, medicaments and biological substances status: Secondary | ICD-10-CM | POA: Diagnosis not present

## 2016-12-06 DIAGNOSIS — M1990 Unspecified osteoarthritis, unspecified site: Secondary | ICD-10-CM | POA: Insufficient documentation

## 2016-12-06 DIAGNOSIS — X58XXXA Exposure to other specified factors, initial encounter: Secondary | ICD-10-CM | POA: Insufficient documentation

## 2016-12-06 DIAGNOSIS — I10 Essential (primary) hypertension: Secondary | ICD-10-CM | POA: Insufficient documentation

## 2016-12-06 DIAGNOSIS — Z96651 Presence of right artificial knee joint: Secondary | ICD-10-CM | POA: Diagnosis not present

## 2016-12-06 DIAGNOSIS — Z86718 Personal history of other venous thrombosis and embolism: Secondary | ICD-10-CM | POA: Diagnosis not present

## 2016-12-06 DIAGNOSIS — Z6841 Body Mass Index (BMI) 40.0 and over, adult: Secondary | ICD-10-CM | POA: Diagnosis not present

## 2016-12-06 DIAGNOSIS — S76111D Strain of right quadriceps muscle, fascia and tendon, subsequent encounter: Secondary | ICD-10-CM

## 2016-12-06 DIAGNOSIS — S76191A Other specified injury of right quadriceps muscle, fascia and tendon, initial encounter: Secondary | ICD-10-CM | POA: Diagnosis not present

## 2016-12-06 HISTORY — PX: QUADRICEPS TENDON REPAIR: SHX756

## 2016-12-06 LAB — TYPE AND SCREEN
ABO/RH(D): A POS
Antibody Screen: NEGATIVE

## 2016-12-06 SURGERY — REPAIR, TENDON, QUADRICEPS
Anesthesia: General | Laterality: Right

## 2016-12-06 MED ORDER — SIMVASTATIN 20 MG PO TABS
20.0000 mg | ORAL_TABLET | Freq: Every morning | ORAL | Status: DC
  Administered 2016-12-06 – 2016-12-07 (×2): 20 mg via ORAL
  Filled 2016-12-06 (×2): qty 1

## 2016-12-06 MED ORDER — HYDROMORPHONE HCL 2 MG/ML IJ SOLN
INTRAMUSCULAR | Status: AC
  Filled 2016-12-06: qty 1

## 2016-12-06 MED ORDER — LACTATED RINGERS IV SOLN: INTRAVENOUS | Status: DC

## 2016-12-06 MED ORDER — CEFAZOLIN SODIUM-DEXTROSE 2-4 GM/100ML-% IV SOLN
2.0000 g | Freq: Four times a day (QID) | INTRAVENOUS | Status: AC
  Administered 2016-12-06 (×2): 2 g via INTRAVENOUS
  Filled 2016-12-06 (×2): qty 100

## 2016-12-06 MED ORDER — BISACODYL 10 MG RE SUPP: 10.0000 mg | Freq: Every day | RECTAL | Status: DC | PRN

## 2016-12-06 MED ORDER — LACTATED RINGERS IV SOLN
INTRAVENOUS | Status: DC | PRN
  Administered 2016-12-06 (×2): via INTRAVENOUS

## 2016-12-06 MED ORDER — FERROUS SULFATE 325 (65 FE) MG PO TABS
325.0000 mg | ORAL_TABLET | Freq: Three times a day (TID) | ORAL | Status: DC
  Administered 2016-12-06 (×2): 325 mg via ORAL
  Filled 2016-12-06 (×2): qty 1

## 2016-12-06 MED ORDER — 0.9 % SODIUM CHLORIDE (POUR BTL) OPTIME
TOPICAL | Status: DC | PRN
  Administered 2016-12-06: 1000 mL

## 2016-12-06 MED ORDER — MEPERIDINE HCL 50 MG/ML IJ SOLN: 6.2500 mg | INTRAMUSCULAR | Status: DC | PRN

## 2016-12-06 MED ORDER — SUGAMMADEX SODIUM 200 MG/2ML IV SOLN
INTRAVENOUS | Status: DC | PRN
  Administered 2016-12-06: 200 mg via INTRAVENOUS

## 2016-12-06 MED ORDER — ATENOLOL 25 MG PO TABS
25.0000 mg | ORAL_TABLET | Freq: Every morning | ORAL | Status: DC
  Administered 2016-12-07: 09:00:00 25 mg via ORAL
  Filled 2016-12-06: qty 1

## 2016-12-06 MED ORDER — LEVOTHYROXINE SODIUM 25 MCG PO TABS: 25.0000 ug | ORAL_TABLET | ORAL | Status: DC

## 2016-12-06 MED ORDER — HYDROCODONE-ACETAMINOPHEN 7.5-325 MG PO TABS: 1.0000 | ORAL_TABLET | ORAL | 0 refills | Status: DC | PRN

## 2016-12-06 MED ORDER — BUPIVACAINE HCL (PF) 0.25 % IJ SOLN
INTRAMUSCULAR | Status: DC | PRN
  Administered 2016-12-06: 30 mL

## 2016-12-06 MED ORDER — DEXAMETHASONE SODIUM PHOSPHATE 10 MG/ML IJ SOLN: 10.0000 mg | Freq: Once | INTRAMUSCULAR | Status: DC

## 2016-12-06 MED ORDER — METOCLOPRAMIDE HCL 5 MG PO TABS: 5.0000 mg | ORAL_TABLET | Freq: Three times a day (TID) | ORAL | Status: DC | PRN

## 2016-12-06 MED ORDER — PHENOL 1.4 % MT LIQD: 1.0000 | OROMUCOSAL | Status: DC | PRN

## 2016-12-06 MED ORDER — TRANEXAMIC ACID 1000 MG/10ML IV SOLN
2000.0000 mg | Freq: Once | INTRAVENOUS | Status: DC
  Filled 2016-12-06: qty 20

## 2016-12-06 MED ORDER — MAGNESIUM CITRATE PO SOLN: 1.0000 | Freq: Once | ORAL | Status: DC | PRN

## 2016-12-06 MED ORDER — SCOPOLAMINE 1 MG/3DAYS TD PT72
MEDICATED_PATCH | TRANSDERMAL | Status: DC | PRN
  Administered 2016-12-06: 1 via TRANSDERMAL

## 2016-12-06 MED ORDER — FENTANYL CITRATE (PF) 100 MCG/2ML IJ SOLN
INTRAMUSCULAR | Status: AC
  Filled 2016-12-06: qty 2

## 2016-12-06 MED ORDER — PHENYLEPHRINE 40 MCG/ML (10ML) SYRINGE FOR IV PUSH (FOR BLOOD PRESSURE SUPPORT)
PREFILLED_SYRINGE | INTRAVENOUS | Status: AC
  Filled 2016-12-06: qty 10

## 2016-12-06 MED ORDER — CALCIUM CARBONATE ANTACID 500 MG PO CHEW: 4.0000 | CHEWABLE_TABLET | Freq: Every day | ORAL | Status: DC | PRN

## 2016-12-06 MED ORDER — SODIUM CHLORIDE 0.9 % IJ SOLN
INTRAMUSCULAR | Status: DC | PRN
  Administered 2016-12-06: 30 mL

## 2016-12-06 MED ORDER — HYDROMORPHONE HCL 1 MG/ML IJ SOLN
INTRAMUSCULAR | Status: DC | PRN
  Administered 2016-12-06 (×4): 0.5 mg via INTRAVENOUS

## 2016-12-06 MED ORDER — DEXAMETHASONE SODIUM PHOSPHATE 10 MG/ML IJ SOLN
INTRAMUSCULAR | Status: DC | PRN
  Administered 2016-12-06: 10 mg via INTRAVENOUS

## 2016-12-06 MED ORDER — DIPHENHYDRAMINE HCL 25 MG PO CAPS: 25.0000 mg | ORAL_CAPSULE | Freq: Four times a day (QID) | ORAL | Status: DC | PRN

## 2016-12-06 MED ORDER — GLYCOPYRROLATE 0.2 MG/ML IV SOSY
PREFILLED_SYRINGE | INTRAVENOUS | Status: AC
  Filled 2016-12-06: qty 5

## 2016-12-06 MED ORDER — ROCURONIUM BROMIDE 50 MG/5ML IV SOSY
PREFILLED_SYRINGE | INTRAVENOUS | Status: AC
  Filled 2016-12-06: qty 5

## 2016-12-06 MED ORDER — ONDANSETRON HCL 4 MG/2ML IJ SOLN
4.0000 mg | Freq: Four times a day (QID) | INTRAMUSCULAR | Status: DC | PRN
  Administered 2016-12-07: 4 mg via INTRAVENOUS
  Filled 2016-12-06: qty 2

## 2016-12-06 MED ORDER — METOCLOPRAMIDE HCL 5 MG/ML IJ SOLN: 5.0000 mg | Freq: Three times a day (TID) | INTRAMUSCULAR | Status: DC | PRN

## 2016-12-06 MED ORDER — SUGAMMADEX SODIUM 200 MG/2ML IV SOLN
INTRAVENOUS | Status: AC
  Filled 2016-12-06: qty 2

## 2016-12-06 MED ORDER — PROPOFOL 10 MG/ML IV BOLUS
INTRAVENOUS | Status: AC
  Filled 2016-12-06: qty 20

## 2016-12-06 MED ORDER — ROCURONIUM BROMIDE 50 MG/5ML IV SOSY
PREFILLED_SYRINGE | INTRAVENOUS | Status: DC | PRN
  Administered 2016-12-06: 50 mg via INTRAVENOUS

## 2016-12-06 MED ORDER — ALUM & MAG HYDROXIDE-SIMETH 200-200-20 MG/5ML PO SUSP: 30.0000 mL | ORAL | Status: DC | PRN

## 2016-12-06 MED ORDER — METHOCARBAMOL 1000 MG/10ML IJ SOLN
500.0000 mg | Freq: Four times a day (QID) | INTRAVENOUS | Status: DC | PRN
  Administered 2016-12-06: 500 mg via INTRAVENOUS
  Filled 2016-12-06: qty 550
  Filled 2016-12-06: qty 5

## 2016-12-06 MED ORDER — LIDOCAINE 2% (20 MG/ML) 5 ML SYRINGE
INTRAMUSCULAR | Status: AC
  Filled 2016-12-06: qty 5

## 2016-12-06 MED ORDER — ONDANSETRON HCL 4 MG PO TABS: 4.0000 mg | ORAL_TABLET | Freq: Four times a day (QID) | ORAL | Status: DC | PRN

## 2016-12-06 MED ORDER — SUCCINYLCHOLINE CHLORIDE 200 MG/10ML IV SOSY
PREFILLED_SYRINGE | INTRAVENOUS | Status: AC
  Filled 2016-12-06: qty 10

## 2016-12-06 MED ORDER — GABAPENTIN 300 MG PO CAPS
600.0000 mg | ORAL_CAPSULE | Freq: Three times a day (TID) | ORAL | Status: DC
  Administered 2016-12-06 – 2016-12-07 (×3): 600 mg via ORAL
  Filled 2016-12-06 (×3): qty 2

## 2016-12-06 MED ORDER — PROMETHAZINE HCL 25 MG/ML IJ SOLN: 6.2500 mg | INTRAMUSCULAR | Status: DC | PRN

## 2016-12-06 MED ORDER — SCOPOLAMINE 1 MG/3DAYS TD PT72
MEDICATED_PATCH | TRANSDERMAL | Status: AC
  Filled 2016-12-06: qty 1

## 2016-12-06 MED ORDER — FENTANYL CITRATE (PF) 100 MCG/2ML IJ SOLN
INTRAMUSCULAR | Status: DC | PRN
  Administered 2016-12-06: 100 ug via INTRAVENOUS

## 2016-12-06 MED ORDER — METHOCARBAMOL 500 MG PO TABS: 500.0000 mg | ORAL_TABLET | Freq: Four times a day (QID) | ORAL | Status: DC | PRN

## 2016-12-06 MED ORDER — LIDOCAINE 2% (20 MG/ML) 5 ML SYRINGE
INTRAMUSCULAR | Status: DC | PRN
  Administered 2016-12-06: 100 mg via INTRAVENOUS

## 2016-12-06 MED ORDER — DEXAMETHASONE SODIUM PHOSPHATE 10 MG/ML IJ SOLN
10.0000 mg | Freq: Once | INTRAMUSCULAR | Status: AC
  Administered 2016-12-07: 10 mg via INTRAVENOUS
  Filled 2016-12-06: qty 1

## 2016-12-06 MED ORDER — HYDROCODONE-ACETAMINOPHEN 7.5-325 MG PO TABS
1.0000 | ORAL_TABLET | ORAL | Status: DC
  Administered 2016-12-06 – 2016-12-07 (×5): 2 via ORAL
  Administered 2016-12-07: 12:00:00 1 via ORAL
  Administered 2016-12-07: 09:00:00 2 via ORAL
  Filled 2016-12-06 (×2): qty 2
  Filled 2016-12-06: qty 1
  Filled 2016-12-06 (×5): qty 2

## 2016-12-06 MED ORDER — BUPIVACAINE HCL (PF) 0.25 % IJ SOLN
INTRAMUSCULAR | Status: AC
  Filled 2016-12-06: qty 30

## 2016-12-06 MED ORDER — GABAPENTIN 600 MG PO TABS
600.0000 mg | ORAL_TABLET | Freq: Three times a day (TID) | ORAL | Status: DC
Start: 2016-12-06 — End: 2016-12-06
  Filled 2016-12-06: qty 1

## 2016-12-06 MED ORDER — CHLORHEXIDINE GLUCONATE 4 % EX LIQD: 60.0000 mL | Freq: Once | CUTANEOUS | Status: DC

## 2016-12-06 MED ORDER — GLYCOPYRROLATE 0.2 MG/ML IV SOSY
PREFILLED_SYRINGE | INTRAVENOUS | Status: DC | PRN
  Administered 2016-12-06: .2 mg via INTRAVENOUS

## 2016-12-06 MED ORDER — TRANEXAMIC ACID 1000 MG/10ML IV SOLN
INTRAVENOUS | Status: DC | PRN
  Administered 2016-12-06: 2000 mg via TOPICAL

## 2016-12-06 MED ORDER — EPHEDRINE SULFATE-NACL 50-0.9 MG/10ML-% IV SOSY
PREFILLED_SYRINGE | INTRAVENOUS | Status: DC | PRN
  Administered 2016-12-06: 5 mg via INTRAVENOUS
  Administered 2016-12-06: 10 mg via INTRAVENOUS

## 2016-12-06 MED ORDER — MENTHOL 3 MG MT LOZG: 1.0000 | LOZENGE | OROMUCOSAL | Status: DC | PRN

## 2016-12-06 MED ORDER — DOCUSATE SODIUM 100 MG PO CAPS
100.0000 mg | ORAL_CAPSULE | Freq: Two times a day (BID) | ORAL | Status: DC
  Administered 2016-12-06 – 2016-12-07 (×2): 100 mg via ORAL
  Filled 2016-12-06 (×2): qty 1

## 2016-12-06 MED ORDER — ALPRAZOLAM 0.5 MG PO TABS
0.5000 mg | ORAL_TABLET | Freq: Two times a day (BID) | ORAL | Status: DC
  Administered 2016-12-06 – 2016-12-07 (×2): 0.5 mg via ORAL
  Filled 2016-12-06 (×2): qty 1

## 2016-12-06 MED ORDER — FERROUS SULFATE 325 (65 FE) MG PO TABS: 325.0000 mg | ORAL_TABLET | Freq: Three times a day (TID) | ORAL | Status: DC

## 2016-12-06 MED ORDER — POLYETHYLENE GLYCOL 3350 17 G PO PACK: 17.0000 g | PACK | Freq: Two times a day (BID) | ORAL | 0 refills | Status: DC

## 2016-12-06 MED ORDER — POLYETHYLENE GLYCOL 3350 17 G PO PACK
17.0000 g | PACK | Freq: Two times a day (BID) | ORAL | Status: DC
  Administered 2016-12-06 – 2016-12-07 (×2): 17 g via ORAL
  Filled 2016-12-06 (×2): qty 1

## 2016-12-06 MED ORDER — ONDANSETRON HCL 4 MG/2ML IJ SOLN
INTRAMUSCULAR | Status: AC
  Filled 2016-12-06: qty 2

## 2016-12-06 MED ORDER — SUCRALFATE 1 G PO TABS
1.0000 g | ORAL_TABLET | Freq: Three times a day (TID) | ORAL | Status: DC
  Administered 2016-12-06 – 2016-12-07 (×5): 1 g via ORAL
  Filled 2016-12-06 (×5): qty 1

## 2016-12-06 MED ORDER — KETOROLAC TROMETHAMINE 30 MG/ML IJ SOLN
INTRAMUSCULAR | Status: DC | PRN
  Administered 2016-12-06: 30 mg via INTRAVENOUS

## 2016-12-06 MED ORDER — PROPOFOL 10 MG/ML IV BOLUS
INTRAVENOUS | Status: DC | PRN
  Administered 2016-12-06: 180 mg via INTRAVENOUS

## 2016-12-06 MED ORDER — SUCCINYLCHOLINE CHLORIDE 200 MG/10ML IV SOSY
PREFILLED_SYRINGE | INTRAVENOUS | Status: DC | PRN
  Administered 2016-12-06: 120 mg via INTRAVENOUS

## 2016-12-06 MED ORDER — SODIUM CHLORIDE 0.9 % IJ SOLN
INTRAMUSCULAR | Status: AC
  Filled 2016-12-06: qty 50

## 2016-12-06 MED ORDER — KETOROLAC TROMETHAMINE 30 MG/ML IJ SOLN
INTRAMUSCULAR | Status: AC
  Filled 2016-12-06: qty 1

## 2016-12-06 MED ORDER — ONDANSETRON HCL 4 MG/2ML IJ SOLN
INTRAMUSCULAR | Status: DC | PRN
  Administered 2016-12-06: 4 mg via INTRAVENOUS

## 2016-12-06 MED ORDER — SODIUM CHLORIDE 0.9 % IV SOLN
INTRAVENOUS | Status: DC
  Administered 2016-12-06 – 2016-12-07 (×3): via INTRAVENOUS
  Filled 2016-12-06 (×5): qty 1000

## 2016-12-06 MED ORDER — PANTOPRAZOLE SODIUM 40 MG PO TBEC
40.0000 mg | DELAYED_RELEASE_TABLET | Freq: Every day | ORAL | Status: DC
  Administered 2016-12-06 – 2016-12-07 (×2): 40 mg via ORAL
  Filled 2016-12-06 (×2): qty 1

## 2016-12-06 MED ORDER — FUROSEMIDE 40 MG PO TABS
40.0000 mg | ORAL_TABLET | Freq: Every morning | ORAL | Status: DC
  Administered 2016-12-06 – 2016-12-07 (×2): 40 mg via ORAL
  Filled 2016-12-06 (×2): qty 1

## 2016-12-06 MED ORDER — HYDROMORPHONE HCL 1 MG/ML IJ SOLN
0.5000 mg | INTRAMUSCULAR | Status: DC | PRN
  Administered 2016-12-06 (×2): 2 mg via INTRAVENOUS
  Filled 2016-12-06 (×2): qty 2

## 2016-12-06 MED ORDER — HYDROMORPHONE HCL 1 MG/ML IJ SOLN
0.2500 mg | INTRAMUSCULAR | Status: DC | PRN
  Administered 2016-12-06: 0.25 mg via INTRAVENOUS
  Administered 2016-12-06 (×2): 0.5 mg via INTRAVENOUS

## 2016-12-06 MED ORDER — RIVAROXABAN 10 MG PO TABS
10.0000 mg | ORAL_TABLET | ORAL | Status: DC
  Administered 2016-12-07: 09:00:00 10 mg via ORAL
  Filled 2016-12-06: qty 1

## 2016-12-06 SURGICAL SUPPLY — 64 items
BAG ZIPLOCK 12X15 (MISCELLANEOUS) ×3 IMPLANT
BANDAGE ACE 6X5 VEL STRL LF (GAUZE/BANDAGES/DRESSINGS) ×3 IMPLANT
BANDAGE ESMARK 6X9 LF (GAUZE/BANDAGES/DRESSINGS) IMPLANT
BIT DRILL 2.8X128 (BIT) ×2 IMPLANT
BIT DRILL 2.8X128MM (BIT) ×1
BNDG ESMARK 6X9 LF (GAUZE/BANDAGES/DRESSINGS)
CUFF TOURN SGL QUICK 34 (TOURNIQUET CUFF)
CUFF TOURN SGL QUICK 44 (TOURNIQUET CUFF) ×3 IMPLANT
CUFF TRNQT CYL 34X4X40X1 (TOURNIQUET CUFF) IMPLANT
DERMABOND ADVANCED (GAUZE/BANDAGES/DRESSINGS) ×2
DERMABOND ADVANCED .7 DNX12 (GAUZE/BANDAGES/DRESSINGS) ×1 IMPLANT
DRAPE EXTREMITY T 121X128X90 (DRAPE) ×3 IMPLANT
DRAPE POUCH INSTRU U-SHP 10X18 (DRAPES) IMPLANT
DRAPE U-SHAPE 47X51 STRL (DRAPES) ×3 IMPLANT
DRSG AQUACEL AG ADV 3.5X10 (GAUZE/BANDAGES/DRESSINGS) IMPLANT
DRSG AQUACEL AG ADV 3.5X14 (GAUZE/BANDAGES/DRESSINGS) ×3 IMPLANT
DRSG TEGADERM 4X4.75 (GAUZE/BANDAGES/DRESSINGS) IMPLANT
DURAPREP 26ML APPLICATOR (WOUND CARE) ×6 IMPLANT
ELECT REM PT RETURN 9FT ADLT (ELECTROSURGICAL) ×3
ELECTRODE REM PT RTRN 9FT ADLT (ELECTROSURGICAL) ×1 IMPLANT
FACESHIELD WRAPAROUND (MASK) ×6 IMPLANT
GAUZE SPONGE 2X2 8PLY STRL LF (GAUZE/BANDAGES/DRESSINGS) IMPLANT
GLOVE BIOGEL M 7.0 STRL (GLOVE) IMPLANT
GLOVE BIOGEL PI IND STRL 7.5 (GLOVE) ×1 IMPLANT
GLOVE BIOGEL PI IND STRL 8.5 (GLOVE) ×1 IMPLANT
GLOVE BIOGEL PI INDICATOR 7.5 (GLOVE) ×2
GLOVE BIOGEL PI INDICATOR 8.5 (GLOVE) ×2
GLOVE ECLIPSE 8.0 STRL XLNG CF (GLOVE) ×3 IMPLANT
GLOVE ORTHO TXT STRL SZ7.5 (GLOVE) ×6 IMPLANT
GLOVE SURG ORTHO 8.0 STRL STRW (GLOVE) ×3 IMPLANT
GOWN STRL REUS W/TWL LRG LVL3 (GOWN DISPOSABLE) ×3 IMPLANT
GOWN STRL REUS W/TWL XL LVL3 (GOWN DISPOSABLE) ×6 IMPLANT
IMMOBILIZER KNEE 20 (SOFTGOODS) ×3
IMMOBILIZER KNEE 20 THIGH 36 (SOFTGOODS) ×1 IMPLANT
IMMOBILIZER KNEE 22 UNIV (SOFTGOODS) IMPLANT
KIT BASIN OR (CUSTOM PROCEDURE TRAY) ×3 IMPLANT
LIQUID BAND (GAUZE/BANDAGES/DRESSINGS) ×3 IMPLANT
MANIFOLD NEPTUNE II (INSTRUMENTS) ×3 IMPLANT
NEEDLE MA TROC 1/2 CIR (NEEDLE) IMPLANT
NEEDLE SPNL 18GX3.5 QUINCKE PK (NEEDLE) IMPLANT
PACK TOTAL JOINT (CUSTOM PROCEDURE TRAY) ×3 IMPLANT
PASSER SUT SWANSON 36MM LOOP (INSTRUMENTS) IMPLANT
POSITIONER SURGICAL ARM (MISCELLANEOUS) ×3 IMPLANT
SPONGE GAUZE 2X2 STER 10/PKG (GAUZE/BANDAGES/DRESSINGS)
SPONGE LAP 18X18 X RAY DECT (DISPOSABLE) ×3 IMPLANT
STAPLER VISISTAT 35W (STAPLE) IMPLANT
SUT ETHIBOND 2 OS 4 DA (SUTURE) IMPLANT
SUT ETHIBOND 5 LR DA (SUTURE) IMPLANT
SUT ETHIBOND NAB CT1 #1 30IN (SUTURE) ×9 IMPLANT
SUT FIBERWIRE #2 38 T-5 BLUE (SUTURE)
SUT FIBERWIRE #5 38 BLUE (WIRE) ×6 IMPLANT
SUT MNCRL AB 3-0 PS2 18 (SUTURE) ×3 IMPLANT
SUT MNCRL AB 4-0 PS2 18 (SUTURE) ×3 IMPLANT
SUT VIC AB 0 CT1 27 (SUTURE)
SUT VIC AB 0 CT1 27XBRD ANTBC (SUTURE) IMPLANT
SUT VIC AB 1 CT1 36 (SUTURE) ×6 IMPLANT
SUT VIC AB 2-0 CT1 27 (SUTURE) ×6
SUT VIC AB 2-0 CT1 TAPERPNT 27 (SUTURE) ×3 IMPLANT
SUTURE FIBERWR #2 38 T-5 BLUE (SUTURE) IMPLANT
SYR 50ML LL SCALE MARK (SYRINGE) ×6 IMPLANT
TOWEL OR 17X26 10 PK STRL BLUE (TOWEL DISPOSABLE) ×3 IMPLANT
WATER STERILE IRR 1500ML POUR (IV SOLUTION) ×3 IMPLANT
WRAP KNEE MAXI GEL POST OP (GAUZE/BANDAGES/DRESSINGS) ×3 IMPLANT
YANKAUER SUCT BULB TIP 10FT TU (MISCELLANEOUS) ×3 IMPLANT

## 2016-12-06 NOTE — Evaluation (Signed)
Physical Therapy Evaluation Patient Details Name: [name] L Kintzel MRN: [id number] DOB: 13-Jul-1946 Today's Date: 12/06/2016   History of Present Illness  Pt is a 71 year old female s/p reconstruction of a right quadriceps tendon due to failed right quadriceps tendon following revision right total knee surgery  Clinical Impression  Patient is s/p above surgery resulting in functional limitations due to the deficits listed below (see PT Problem List).  Patient will benefit from skilled PT to increase their independence and safety with mobility to allow discharge to the venue listed below.  Pt with hx of multiple joint surgeries and plans to d/c home.  Pt educated to remain in brace (KI in room today) and not allow any knee flexion.  Pt groggy and falling asleep quickly during session today however anticipate good progress.  Recommend f/u PT once pt has no restrictions (per surgeon).     Follow Up Recommendations Supervision for mobility/OOB;Other (comment);No PT follow up (would best benefit from f/u PT after restrictions lifted)    Equipment Recommendations  None recommended by PT    Recommendations for Other Services       Precautions / Restrictions Precautions Precautions: Fall;Knee Precaution Comments: KI or bledsoe, knee must remain in extension - no flexion (only KI in room today) Required Braces or Orthoses: Knee Immobilizer - Right Knee Immobilizer - Right: On at all times Restrictions Other Position/Activity Restrictions: WBAT with brace      Mobility  Bed Mobility Overal bed mobility: Needs Assistance Bed Mobility: Supine to Sit     Supine to sit: Min assist;HOB elevated     General bed mobility comments: assist for R LE support  Transfers Overall transfer level: Needs assistance Equipment used: Rolling walker (2 wheeled) Transfers: Sit to/from [employer] CorporationStand;Stand Pivot Transfers Sit to Stand: Min assist Stand pivot transfers: Min assist       General transfer  comment: verbal cues for safe technique, [facility], reported minimal dizziness however did not become worse, pt requested ambulation  Ambulation/Gait Ambulation/Gait assistance: Min guard Ambulation Distance (Feet): 100 Feet Assistive device: Rolling walker (2 wheeled) Gait Pattern/deviations: Step-through pattern;Decreased stride length;Antalgic     General Gait Details: verbal cues for sequence and WBing through RW, recliner following for safety  Stairs            Wheelchair Mobility    Modified Rankin (Stroke Patients Only)       Balance                                             Pertinent Vitals/Pain Pain Assessment: 0-10 Pain Score: 2  Pain Location: R knee Pain Descriptors / Indicators: Sore Pain Intervention(s): Monitored during session;Premedicated before session;Limited activity within patient's tolerance;Repositioned    Home Living Family/patient expects to be discharged to:: Private residence Living Arrangements: Spouse/significant other   Type of Home: House Home Access: Stairs to enter Entrance Stairs-Rails: Right Entrance Stairs-Number of Steps: 2 Home Layout: One level Home Equipment: Environmental consultantWalker - 2 wheels;Bedside commode;Cane - single point      Prior Function Level of Independence: Independent with assistive device(s)               Hand Dominance        Extremity/Trunk Assessment   Upper Extremity Assessment Upper Extremity Assessment: Generalized weakness (spouse reports pt has weak upper body strength)    Lower  Extremity Assessment Lower Extremity Assessment: RLE deficits/detail RLE Deficits / Details: remained in KI, pt aware no knee flexion, able to move ankle RLE: Unable to fully assess due to immobilization       Communication   Communication: No difficulties  Cognition Arousal/Alertness: Lethargic;Suspect due to medications Behavior During Therapy: [facility] for tasks assessed/performed Overall  Cognitive Status: Within Functional Limits for tasks assessed                 General Comments: pt falls alseep quickly if not engaged in task, easily awakens - likely due to meds    General Comments      Exercises     Assessment/Plan    PT Assessment Patient needs continued PT services  PT Problem List Decreased strength;Decreased mobility;Decreased knowledge of precautions;Pain       PT Treatment Interventions DME instruction;Gait training;Functional mobility training;Therapeutic activities;Patient/family education;Therapeutic exercise;Stair training    PT Goals (Current goals can be found in the Care Plan section)  Acute Rehab PT Goals PT Goal Formulation: With patient Time For Goal Achievement: 12/10/16 Potential to Achieve Goals: Good    Frequency Min 5X/week   Barriers to discharge        Co-evaluation               End of Session Equipment Utilized During Treatment: Gait belt;Right knee immobilizer Activity Tolerance: Patient tolerated treatment well Patient left: in chair;with chair alarm set;with call bell/phone within reach;with family/visitor present Nurse Communication: Mobility status (observed pt ambulating in hallway) PT Visit Diagnosis: Other abnormalities of gait and mobility (R26.89)    Functional Assessment Tool Used: AM-PAC 6 Clicks Basic Mobility;Clinical judgement Functional Limitation: Mobility: Walking and moving around Mobility: Walking and Moving Around Current Status (Z6109): At least 40 percent but less than 60 percent impaired, limited or restricted Mobility: Walking and Moving Around Goal Status [phone]): At least 1 percent but less than 20 percent impaired, limited or restricted    Time: 1525-1547 PT Time Calculation (min) (ACUTE ONLY): 22 min   Charges:   PT Evaluation $PT Eval Low Complexity: 1 Procedure     PT G Codes:   PT G-Codes **NOT FOR INPATIENT CLASS** Functional Assessment Tool Used: AM-PAC 6 Clicks Basic  Mobility;Clinical judgement Functional Limitation: Mobility: Walking and moving around Mobility: Walking and Moving Around Current Status (U9811): At least 40 percent but less than 60 percent impaired, limited or restricted Mobility: Walking and Moving Around Goal Status [phone]): At least 1 percent but less than 20 percent impaired, limited or restricted     Monterrio Gerst,KATHrine E 12/06/2016, 4:53 PM Zenovia Jarred, PT, DPT 12/06/2016 Pager: [phone]

## 2016-12-06 NOTE — Brief Op Note (Signed)
12/06/2016  9:02 AM  PATIENT:  [name]  71 y.o. female  PRE-OPERATIVE DIAGNOSIS:  Right knee chronic failure of quad mechanism after revision total knee  POST-OPERATIVE DIAGNOSIS:  Right knee chronic failure of quad mechanism after revision total knee  PROCEDURE:  Procedure(s) with comments: QUADricep TENDON RECONSTRUCTION (Right)   SURGEON:  Surgeon(s) and Role:    * Durene RomansMatthew Shaka Cardin, MD - Primary  PHYSICIAN ASSISTANT: Lanney GinsMatthew Babish, PA-C  ANESTHESIA:   general  EBL:  Total I/O In: -  Out: 50 [Blood:50]  BLOOD ADMINISTERED:none  DRAINS: none   LOCAL MEDICATIONS USED:  MARCAINE     SPECIMEN:  No Specimen  DISPOSITION OF SPECIMEN:  N/A  COUNTS:  YES  TOURNIQUET:  65 min at 250 mmHg  DICTATION: .Other Dictation: Dictation Number [id number]  PLAN OF CARE: Admit for overnight observation  PATIENT DISPOSITION:  PACU - hemodynamically stable.   Delay start of Pharmacological VTE agent (>24hrs) due to surgical blood loss or risk of bleeding: no

## 2016-12-06 NOTE — Anesthesia Postprocedure Evaluation (Addendum)
Anesthesia Post Note  Patient: [name]  Procedure(s) Performed: Procedure(s) (LRB): QUAD TENDON RECONSTRUCTION (Right)  Patient location during evaluation: PACU Anesthesia Type: General Level of consciousness: awake Pain management: pain level controlled Vital Signs Assessment: post-procedure vital signs reviewed and stable Respiratory status: spontaneous breathing Cardiovascular status: stable Postop Assessment: no signs of nausea or vomiting Anesthetic complications: no        Last Vitals:  Vitals:   12/06/16 1115 12/06/16 1220  BP: 131/63 117/68  Pulse: (!) 49 (!) 46  Resp: 16 16  Temp: 36.7 C 36.5 C    Last Pain:  Vitals:   12/06/16 1220  TempSrc: Oral  PainSc:    Pain Goal: Patients Stated Pain Goal: 3 (12/06/16 1120)               Danitra Payano JR,JOHN Lema Heinkel

## 2016-12-06 NOTE — Anesthesia Procedure Notes (Addendum)
Procedure Name: Intubation Date/Time: 12/06/2016 7:40 AM Performed by: Montel Clock Pre-anesthesia Checklist: Patient identified, Emergency Drugs available, Suction available, Patient being monitored and Timeout performed Patient Re-evaluated:Patient Re-evaluated prior to inductionOxygen Delivery Method: Circle system utilized Preoxygenation: Pre-oxygenation with 100% oxygen Intubation Type: IV induction Ventilation: Mask ventilation without difficulty and Oral airway inserted - appropriate to patient size Laryngoscope Size: Mac and 3 Grade View: Grade I Tube type: Oral Tube size: 7.0 mm Number of attempts: 1 Airway Equipment and Method: Stylet Placement Confirmation: ETT inserted through vocal cords under direct vision,  positive ETCO2 and breath sounds checked- equal and bilateral Secured at: 21 cm Tube secured with: Tape Dental Injury: Teeth and Oropharynx as per pre-operative assessment

## 2016-12-06 NOTE — Interval H&P Note (Signed)
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 After consideration of risks, benefits and other options for treatment, the patient has consented to  Procedure(s) with comments: Open attempted repair/reconstruction right quad tendon (Right) - Requests for 90 mins as a surgical intervention .  The patient's history has been reviewed, patient examined, no change in status, stable for surgery.  I have reviewed the patient's chart and labs.  Questions were answered to the patient's satisfaction.     Shelda PalLIN,Kaetlin Bullen D

## 2016-12-06 NOTE — Transfer of Care (Signed)
Immediate Anesthesia Transfer of Care Note  Patient: [name]  Procedure(s) Performed: Procedure(s) with comments: QUAD TENDON RECONSTRUCTION (Right) - Requests for 90 mins  Patient Location: PACU  Anesthesia Type:General  Level of Consciousness:  sedated, patient cooperative and responds to stimulation  Airway & Oxygen Therapy:Patient Spontanous Breathing and Patient connected to face mask oxgen  Post-op Assessment:  Report given to PACU RN and Post -op Vital signs reviewed and stable  Post vital signs:  Reviewed and stable  Last Vitals:  Vitals:   12/06/16 0545  BP: (!) 151/76  Pulse: (!) 58  Resp: 16  Temp: 36.8 C    Complications: No apparent anesthesia complications

## 2016-12-06 NOTE — Op Note (Signed)
[name], [name] NO.:  [id number]  MEDICAL RECORD NO.:  [id number]  LOCATION:                                 FACILITY:  PHYSICIAN:  Madlyn Frankel. Charlann Boxer, M.D.  DATE OF BIRTH:  01-21-1946  DATE OF PROCEDURE:  12/06/2016 DATE OF DISCHARGE:                              OPERATIVE REPORT   PREOPERATIVE DIAGNOSIS:  Failed right quadriceps tendon following revision right total knee surgery, most likely associated with a quadriceps snip.  POSTOPERATIVE DIAGNOSIS:  Failed right quadriceps tendon following revision right total knee surgery, most likely associated with a quadriceps snip.  PROCEDURE:  Reconstruction of a right quadriceps tendon.  This was a suture based repair.  SURGEON:  Madlyn Frankel. Charlann Boxer, M.D.  ASSISTANT:  Lanney Gins, PAC.  Note that Mr. [name] was present for the entirety of the case from preoperative position, perioperative management of the operative extremity, general facilitation of the case, and primary wound closure.  ANESTHESIA:  General.  SPECIMENS:  None.  COMPLICATIONS:  None.  TOURNIQUET:  Tourniquet was utilized.  BLOOD LOSS:  Minimal.  FINDINGS:  The patient was noted to have an intact extensor mechanism, but significantly lacks in its overall quality.  Her vastus medialis was intact and contracted normally to Bovie stimulus.  She did identify permanent sutures within the quadriceps tendon in the area of a previously performed quadriceps snip.  INDICATIONS FOR PROCEDURE:  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  She was sent to [facility] for their assistance in revising her due to instability of the knee.  I have subsequently been following [name] for a number of years. She has had problems with this right knee for some time.  Most recently after recent falls, we re-evaluated her knee and identified that her patella was  down low.  It has been that way for some time actually looking back at x-rays.  I discussed with her that we could attempt to reconstruct or to try to reapproximate the quadriceps tendon depending on what was found in the knee through various mechanisms.  At the time of revision surgery, her patellar button was not revised. We discussed the risks of not identifying any potential repairable pathology, the potential for failure of what we attempt to do versus the benefits of improved stabilization of her lower extremity.  In addition, standard risks of infection and DVT discussed and reviewed. Consent was obtained.  PROCEDURE IN DETAIL:  The patient was brought to the operative theater. Once adequate anesthesia, preoperative antibiotics including 3 g of Ancef due to her weight of 120 kg, and Decadron administered, she was positioned supine.  Right thigh tourniquet was placed.  The right lower extremity was then prepped and draped in sterile fashion.  A time-out was performed identifying the patient, planned procedures, and attempted procedures in the right lower extremity.  Leg was exsanguinated. Tourniquet elevated to 250 mmHg.  Her old incision had been identified. I excised the skin, subcutaneous tissue, and fat down to the extensor mechanism.  At this point, I found the extensor mechanism and envelope was intact. I  was able to identify the vastus medialis medially in a contracted briskly with Bovie stimulus.  At this point, I did create a median arthrotomy encountering clear synovial fluid.  I took this down to the tibial tubercle and then performed a synovectomy medially and around the patella.  At this point, we evaluated the soft tissues.  It was evident that she had significantly stretched any attempted repair of a quadriceps snip resulting in the patella baja and lack of adequate contractility of the extensor mechanism.  For this reason, what I did was basically just  tried to pull her patella tendon back into its normal anatomic position.  While I did this and held it in place with a skin clamp, I basically shorten the quadriceps mechanism by probably 2 cm or around that.  With this, I used #1 Ethibond suture to try to permanently hold this tendon in place to allow it to heal.  In doing so, I also created a right cut distal to the vastus medialis medially and was able to excise some soft tissues and then reapproximate that retinacular layer with #1 Vicryl.  Approximately at the area of the previous quadriceps snip, I did incise this transversely and then pulled the anterior portion of the quad tendon distally to almost the corner of the superior medial patella.  I then oversewed this area with #1 Ethibond suture.  Once I had this repaired down with using a total of two #1 Ethibond sutures, the patella at this point was far less mobile than it was before, given me some optimism that there is a chance that this may heal.  I have worries, however, with her size and the strain on her knee that if it is not adequately protected that anything we do at this point will fail.  Once I was satisfied with basically reconstructing the quadriceps tendon, I did oversew medially with #1 Vicryl from the proximal pole of the patella to the tibial tubercle.  This did create a watertight seal as I did inject intra-articularly with 2 g of tranexamic acid.  We also injected medially with Marcaine, 1 mL of Toradol, and normal saline.  At this point, we reapproximated the soft tissues using 2-0 Vicryl.  I then reapproximated the skin with a 3-0 Monocryl to try to get a watertight closure on the skin we used to Dermabond.  The knee was then dressed with an Aquacel dressing.  She was brought to the recovery room with the Ace wrap in place.  She was placed in a knee immobilizer to try to keep her straight as possible.  This will be stressed during her hospitalization  and physical therapy as well as with family.     Madlyn FrankelMatthew D. Charlann Boxerlin, M.D.     MDO/MEDQ  D:  12/06/2016  T:  12/06/2016  Job:  [id number]

## 2016-12-07 DIAGNOSIS — Z96649 Presence of unspecified artificial hip joint: Secondary | ICD-10-CM | POA: Diagnosis not present

## 2016-12-07 DIAGNOSIS — S76111A Strain of right quadriceps muscle, fascia and tendon, initial encounter: Secondary | ICD-10-CM | POA: Diagnosis not present

## 2016-12-07 DIAGNOSIS — S76119A Strain of unspecified quadriceps muscle, fascia and tendon, initial encounter: Secondary | ICD-10-CM | POA: Diagnosis not present

## 2016-12-07 DIAGNOSIS — Z96659 Presence of unspecified artificial knee joint: Secondary | ICD-10-CM | POA: Diagnosis not present

## 2016-12-07 LAB — CBC
HEMATOCRIT: 32.6 % — AB (ref 36.0–46.0)
HEMOGLOBIN: 10.5 g/dL — AB (ref 12.0–15.0)
MCH: 28.7 pg (ref 26.0–34.0)
MCHC: 32.2 g/dL (ref 30.0–36.0)
MCV: 89.1 fL (ref 78.0–100.0)
Platelets: 200 10*3/uL (ref 150–400)
RBC: 3.66 MIL/uL — AB (ref 3.87–5.11)
RDW: 14 % (ref 11.5–15.5)
WBC: 7.7 10*3/uL (ref 4.0–10.5)

## 2016-12-07 LAB — BASIC METABOLIC PANEL
ANION GAP: 9 (ref 5–15)
BUN: 12 mg/dL (ref 6–20)
CO2: 25 mmol/L (ref 22–32)
Calcium: 8.9 mg/dL (ref 8.9–10.3)
Chloride: 102 mmol/L (ref 101–111)
Creatinine, Ser: 0.78 mg/dL (ref 0.44–1.00)
GFR calc Af Amer: >60 mL/min (ref >60)
GFR calc non Af Amer: >60 mL/min (ref >60)
GLUCOSE: 103 mg/dL — AB (ref 65–99)
POTASSIUM: 3.6 mmol/L (ref 3.5–5.1)
Sodium: 136 mmol/L (ref 135–145)

## 2016-12-07 NOTE — Progress Notes (Signed)
Patient ID: [name], female   DOB: [birth year], 71 y.o.   MRN: [id number] Subjective: 1 Day Post-Op Procedure(s) (LRB): QUAD TENDON RECONSTRUCTION (Right)    Patient reports pain as mild.  Reports that she did dome walking already yesterday No events Does report some mid sternal chest pain that she feels is constant and positional as she can exacerbate it when she abducts her arms  Objective:   VITALS:   Vitals:   12/07/16 0249 12/07/16 0654  BP: (!) 119/58 (!) 130/55  Pulse: (!) 43 (!) 42  Resp: 16 16  Temp: 98.2 F (36.8 C) 97.7 F (36.5 C)    Neurovascular intact Incision: dressing C/D/I - knee immobilizer on  LABS  Recent Labs  12/07/16 0424  HGB 10.5*  HCT 32.6*  WBC 7.7  PLT 200     Recent Labs  12/07/16 0424  NA 136  K 3.6  BUN 12  CREATININE 0.78  GLUCOSE 103*    No results for input(s): LABPT, INR in the last 72 hours.   Assessment/Plan: 1 Day Post-Op Procedure(s) (LRB): QUAD TENDON RECONSTRUCTION (Right)   Up with therapy Discharge home with home health - if determined to be needed RTC in 2 weeks Knee immobilizer on at all times with activity Can be off if in bed with leg straight Will direct activity progression through office after first visit

## 2016-12-07 NOTE — Progress Notes (Signed)
Physical Therapy Treatment Patient Details Name: [name] MRN: [id number] DOB: [birth year] Today's Date: 12/07/2016    History of Present Illness Pt is a 71 year old female s/p reconstruction of a right quadriceps tendon due to failed right quadriceps tendon following revision right total knee surgery    PT Comments    Pt assisted into bathroom and performing elevated toilet transfers with supervision.  Pt ambulated in hallway and practiced safe stair technique with daughter in law present and educated as well.  Pt to d/c home later today.   Follow Up Recommendations  Supervision for mobility/OOB;Other (comment);No PT follow up (would best benefit from PT after restrictions lifted)     Equipment Recommendations  None recommended by PT    Recommendations for Other Services       Precautions / Restrictions Precautions Precautions: Fall;Knee Precaution Comments: KI or bledsoe, knee must remain in extension - no flexion (only KI in room today) Required Braces or Orthoses: Knee Immobilizer - Right Knee Immobilizer - Right: On at all times Restrictions Weight Bearing Restrictions: No Other Position/Activity Restrictions: WBAT with brace    Mobility  Bed Mobility Overal bed mobility: Needs Assistance Bed Mobility: Supine to Sit     Supine to sit: Min guard;Min assist     General bed mobility comments: slight assist for guiding L LE  Transfers Overall transfer level: Needs assistance Equipment used: Rolling walker (2 wheeled) Transfers: Sit to/from Stand Sit to Stand: Min guard Stand pivot transfers: Min assist       General transfer comment: verbal cues for safe technique  Ambulation/Gait Ambulation/Gait assistance: Min guard Ambulation Distance (Feet): 200 Feet Assistive device: Rolling walker (2 wheeled) Gait Pattern/deviations: Step-to pattern     General Gait Details: verbal cues for sequence and WBing through RW, pt ambulating well with  RW   Stairs Stairs: Yes   Stair Management: Step to pattern;Backwards;With walker Number of Stairs: 3 General stair comments: verbal cues for sequence, safety and RW positioning, daughter in law assisted with RW  Wheelchair Mobility    Modified Rankin (Stroke Patients Only)       Balance                                    Cognition Arousal/Alertness: Awake/alert Behavior During Therapy: WFL for tasks assessed/performed Overall Cognitive Status: Within Functional Limits for tasks assessed                      Exercises      General Comments        Pertinent Vitals/Pain Pain Assessment: 0-10 Pain Score: 3  Pain Location: R knee Pain Descriptors / Indicators: Sore Pain Intervention(s): Limited activity within patient's tolerance;Monitored during session;Repositioned;Premedicated before session    Home Living Family/patient expects to be discharged to:: Private residence Living Arrangements: Spouse/significant other   Type of Home: House Home Access: Stairs to enter Entrance Stairs-Rails: Right Home Layout: One level Home Equipment: Environmental consultantWalker - 2 wheels;Bedside commode;Cane - single point      Prior Function Level of Independence: Independent with assistive device(s)          PT Goals (current goals can now be found in the care plan section) Acute Rehab PT Goals Patient Stated Goal: get well- i am determined Progress towards PT goals: Progressing toward goals    Frequency    Min 5X/week      PT  Plan Current plan remains appropriate    Co-evaluation             End of Session Equipment Utilized During Treatment: Right knee immobilizer Activity Tolerance: Patient tolerated treatment well Patient left: in chair;with call bell/phone within reach;with family/visitor present   PT Visit Diagnosis: Other abnormalities of gait and mobility (R26.89)     Time: 1191-4782 PT Time Calculation (min) (ACUTE ONLY): 33  min  Charges:  $Gait Training: 23-37 mins                    G Codes:       Jacoya Bauman,KATHrine E 12/07/2016, 12:59 PM Zenovia Jarred, PT, DPT 12/07/2016 Pager: [phone]

## 2016-12-07 NOTE — Progress Notes (Signed)
OT Cancellation Note  Patient Details Name: [name] L Bosak MRN: [id number] DOB: 1946/04/16   Cancelled Treatment:    Reason Eval/Treat Not Completed: Other (comment) Went back to see pt and she had just returned from bathroom. Pt with questions in regards to shower at home. OT answered questions. Pt was concerned about keeping  R knee in extension. OT suggested to wear KI in shower as pt stated she has an extra one in order to maintain knee extension.    Alba CoryREDDING, Angelize Ryce D 12/07/2016, 1:34 PM

## 2016-12-07 NOTE — Care Management Note (Addendum)
Case Management Note  Patient Details  Name: DELANIE TIRRELL MRN: [id number] Date of Birth: 11/10/1945  Subjective/Objective:                  QUAD TENDON RECONSTRUCTION (Right) Action/Plan: Discharge planning Expected Discharge Date:  12/06/16               Expected Discharge Plan:     In-House Referral:     Discharge planning Services  CM Consult  Post Acute Care Choice:  NA Choice offered to:  Spouse, Patient  DME Arranged:  3-N-1, Walker rolling DME Agency:  [place]: [place]:  [facility]  Status of Service:  Completed, signed off  If discussed at [employer], dates discussed:    Additional Comments: 11:00 PT/RN notified change of plan is now for HHPT.  Choice offered and pt chooses [facility] to render service.  Orders and F80fhave been requested.Referral given to [facility] rep, Tim.  Cm met with family and spouse states his wife needs a rolling walker and 3n1. CM notified [employer] rep, KJoelene Millinto please deliver to room as pt is leaving this am. No other CM needs were communicated. JDellie Catholic RN 12/07/2016, 9:48 AM

## 2016-12-07 NOTE — Progress Notes (Signed)
Discharge intructions given to patient and family   D Actuaryranklin RN

## 2016-12-07 NOTE — Care Management Obs Status (Signed)
MEDICARE OBSERVATION STATUS NOTIFICATION   Patient Details  Name: [name] MRN: [id number] Date of Birth: 06/21/1946   Medicare Observation Status Notification Given:  Yes    [name], Gail Tolson Christine, RN 12/07/2016, 11:28 AM

## 2016-12-07 NOTE — Evaluation (Signed)
Occupational Therapy Evaluation Patient Details Name: [name] MRN: [id number] DOB: 02-09-1946 Today's Date: 12/07/2016    History of Present Illness Pt is a 71 year old female s/p reconstruction of a right quadriceps tendon due to failed right quadriceps tendon following revision right total knee surgery   Clinical Impression   Pt admitted with R reconstruction R quadriceps tendon. Pt currently with functional limitations due to the deficits listed below (see OT Problem List). Pt will benefit from skilled OT to increase their safety and independence with ADL and functional mobility for ADL to facilitate discharge to venue listed below.      Follow Up Recommendations  Home health OT    Equipment Recommendations  None recommended by OT    Recommendations for Other Services       Precautions / Restrictions Precautions Precautions: Fall;Knee Precaution Comments: KI or bledsoe, knee must remain in extension - no flexion (only KI in room today) Required Braces or Orthoses: Knee Immobilizer - Right Knee Immobilizer - Right: On at all times Restrictions Weight Bearing Restrictions: No Other Position/Activity Restrictions: WBAT with brace      Mobility Bed Mobility               General bed mobility comments: pt in chair  Transfers Overall transfer level: Needs assistance Equipment used: Rolling walker (2 wheeled) Transfers: Sit to/from [employer] Sit to Stand: Min assist Stand pivot transfers: Min assist            Balance                                            ADL Overall ADL's : Needs assistance/impaired Eating/Feeding: Set up;Sitting   Grooming: Set up;Sitting   Upper Body Bathing: Set up;Sitting   Lower Body Bathing: Moderate assistance;Sit to/from stand;Cueing for safety;Cueing for sequencing;Cueing for compensatory techniques   Upper Body Dressing : Set up;Sitting   Lower Body Dressing: Moderate  assistance;Sit to/from stand;Cueing for safety;Cueing for sequencing;Cueing for compensatory techniques   Toilet Transfer: Minimal assistance;RW;Ambulation;Cueing for sequencing;Cueing for safety   Toileting- Clothing Manipulation and Hygiene: Minimal assistance;Cueing for safety;Cueing for sequencing;Sit to/from Nurse, children's Details (indicate cue type and reason): pt shower is very small at home- will need HHOT to evaluate and make reccomendations Functional mobility during ADLs: Minimal assistance General ADL Comments: Upon discussion of pts ADL activities in her home - pts bathroom sounds to be a challenge in regards to keeping RLE in extension for toilet and shower. Pt would benefit frm HHOT to make sure pt is safe  and following precautions in her home but able to perform her ADL activity              Vision Patient Visual Report: No change from baseline       Perception     Praxis      Pertinent Vitals/Pain Pain Score: 3  Pain Location: R knee Pain Descriptors / Indicators: Sore Pain Intervention(s): Monitored during session        Extremity/Trunk Assessment Upper Extremity Assessment Upper Extremity Assessment: Generalized weakness           Communication Communication Communication: No difficulties   Cognition Arousal/Alertness: Awake/alert Behavior During Therapy: WFL for tasks assessed/performed Overall Cognitive Status: Within Functional Limits for tasks assessed  Home Living Family/patient expects to be discharged to:: Private residence Living Arrangements: Spouse/significant other   Type of Home: House Home Access: Stairs to enter Secretary/administrator of Steps: 2 Entrance Stairs-Rails: Right Home Layout: One level     Bathroom Shower/Tub: Tub/shower unit Shower/tub characteristics: Door       Home Equipment: Environmental consultant - 2 wheels;Bedside commode;Cane - single point          Prior  Functioning/Environment Level of Independence: Independent with assistive device(s)                 OT Problem List: Decreased strength;Pain;Decreased knowledge of use of DME or AE      OT Treatment/Interventions: Self-care/ADL training;DME and/or AE instruction;Patient/family education    OT Goals(Current goals can be found in the care plan section) Acute Rehab OT Goals Patient Stated Goal: get well- i am determined OT Goal Formulation: With patient Time For Goal Achievement: 12/14/16  OT Frequency: Min 2X/week   Barriers to D/C:               End of Session Equipment Utilized During Treatment: Rolling walker;Other (comment) (KI) Nurse Communication: Mobility status  Activity Tolerance: Patient tolerated treatment well Patient left: in bed;with call bell/phone within reach  OT Visit Diagnosis: Pain;Muscle weakness (generalized) (M62.81);Other abnormalities of gait and mobility (R26.89)                ADL either performed or assessed with clinical judgement  Time: 1030-1055 OT Time Calculation (min): 25 min Charges:  OT General Charges $OT Visit: 1 Procedure OT Evaluation $OT Eval Moderate Complexity: 1 Procedure OT Treatments $Self Care/Home Management : 8-22 mins G-Codes:     Lise Auer, OT [phone]  Einar Crow D 12/07/2016, 11:42 AM

## 2016-12-07 NOTE — Progress Notes (Signed)
Physical Therapy Treatment Patient Details Name: [name] MRN: [id number] DOB: 11-Oct-1946 Today's Date: 12/07/2016    History of Present Illness Pt is a 71 year old female s/p reconstruction of a right quadriceps tendon due to failed right quadriceps tendon following revision right total knee surgery    PT Comments    Pt in bathroom on arrival and brace not properly in place so readjusted.  Reinforced proper KI placement and snug fit with pt and family.  Discussed shower set up at home and daughter in law reports pt can sit on built in shower seat and would be able to extend leg in front of her.  Stressed importance of maintaining knee extension (absolutely no flexion) with mobility and while at rest.  Answered pt and daughter in laws questions within scope of practice.  Pt to d/c home later today.   Follow Up Recommendations  Supervision for mobility/OOB;Other (comment);No PT follow up     Equipment Recommendations  None recommended by PT    Recommendations for Other Services       Precautions / Restrictions Precautions Precautions: Fall;Knee Precaution Comments: KI or bledsoe, knee must remain in extension - no flexion (only KI in room today) Required Braces or Orthoses: Knee Immobilizer - Right Knee Immobilizer - Right: On at all times Restrictions Other Position/Activity Restrictions: WBAT with brace    Mobility  Bed Mobility Overal bed mobility: Needs Assistance Bed Mobility: Supine to Sit     Supine to sit: Min guard;Min assist     General bed mobility comments: slight assist for guiding L LE  Transfers Overall transfer level: Needs assistance Equipment used: Rolling walker (2 wheeled) Transfers: Sit to/from Stand Sit to Stand: Supervision Stand pivot transfers: Min assist       General transfer comment: verbal cues for safe technique  Ambulation/Gait Ambulation/Gait assistance: Supervision Ambulation Distance (Feet): 15 Feet Assistive device:  Rolling walker (2 wheeled) Gait Pattern/deviations: Step-to pattern     General Gait Details: pt ambulating well with RW   Stairs  Wheelchair Mobility    Modified Rankin (Stroke Patients Only)       Balance                                    Cognition Arousal/Alertness: Awake/alert Behavior During Therapy: WFL for tasks assessed/performed Overall Cognitive Status: Within Functional Limits for tasks assessed                      Exercises      General Comments        Pertinent Vitals/Pain Pain Assessment: 0-10 Pain Score: 2  Pain Location: R knee Pain Descriptors / Indicators: Sore Pain Intervention(s): Limited activity within patient's tolerance;Monitored during session;Repositioned    Home Living Family/patient expects to be discharged to:: Private residence Living Arrangements: Spouse/significant other   Type of Home: House Home Access: Stairs to enter Entrance Stairs-Rails: Right Home Layout: One level Home Equipment: Environmental consultant - 2 wheels;Bedside commode;Cane - single point      Prior Function Level of Independence: Independent with assistive device(s)          PT Goals (current goals can now be found in the care plan section) Acute Rehab PT Goals Patient Stated Goal: get well- i am determined Progress towards PT goals: Progressing toward goals    Frequency    Min 5X/week      PT Plan  Current plan remains appropriate    Co-evaluation             End of Session Equipment Utilized During Treatment: Right knee immobilizer Activity Tolerance: Patient tolerated treatment well Patient left: in chair;with call bell/phone within reach;with family/visitor present   PT Visit Diagnosis: Other abnormalities of gait and mobility (R26.89)     Time: [id number] PT Time Calculation (min) (ACUTE ONLY): 27 min  Charges:  Therapeutic activity 8-22 minutes                    G Codes:       [name],[name] 12/07/2016, 3:21  PM [name], PT, DPT 12/07/2016 Pager: [phone]

## 2016-12-08 DIAGNOSIS — Z96653 Presence of artificial knee joint, bilateral: Secondary | ICD-10-CM | POA: Diagnosis not present

## 2016-12-08 DIAGNOSIS — Z4789 Encounter for other orthopedic aftercare: Secondary | ICD-10-CM | POA: Diagnosis not present

## 2016-12-08 DIAGNOSIS — Z96642 Presence of left artificial hip joint: Secondary | ICD-10-CM | POA: Diagnosis not present

## 2016-12-08 DIAGNOSIS — Z9181 History of falling: Secondary | ICD-10-CM | POA: Diagnosis not present

## 2016-12-08 DIAGNOSIS — Z96611 Presence of right artificial shoulder joint: Secondary | ICD-10-CM | POA: Diagnosis not present

## 2016-12-08 DIAGNOSIS — Z79891 Long term (current) use of opiate analgesic: Secondary | ICD-10-CM | POA: Diagnosis not present

## 2016-12-08 DIAGNOSIS — Z7901 Long term (current) use of anticoagulants: Secondary | ICD-10-CM | POA: Diagnosis not present

## 2016-12-08 DIAGNOSIS — I10 Essential (primary) hypertension: Secondary | ICD-10-CM | POA: Diagnosis not present

## 2016-12-08 DIAGNOSIS — M1991 Primary osteoarthritis, unspecified site: Secondary | ICD-10-CM | POA: Diagnosis not present

## 2016-12-10 NOTE — Discharge Summary (Signed)
Physician Discharge Summary  Patient ID: [name] MRN: [id number] DOB/AGE: 71-Mar-1947 71 y.o.  Admit date: 12/06/2016 Discharge date: 12/07/2016   Procedures:  Procedure(s) (LRB): QUAD TENDON RECONSTRUCTION (Right)  Attending Physician:  Dr. 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 Anemia   . Arthritis    osteoarthritis  . Complication of anesthesia    hard time waking up after her gallbladder surgery 28 years ago  . DVT (deep venous thrombosis) (HCC)    bilateral lungs  . GERD (gastroesophageal reflux disease)    hx of  . Heart murmur   . History of blood transfusion   . History of hiatal hernia    small  . History of kidney stones   . Hypercholesteremia    under control  . Hypertension   . Hypothyroidism   . PONV (postoperative nausea and vomiting)     HPI:    Pt is a 71 y.o. female complaining of right knee pain / quad failure.  Pain had continually increased since the beginning. X-rays in the clinic show failure of the quad tendon. Pt has tried various conservative treatments which have failed to alleviate their symptoms, including activity modification / assistance devices. Various options are discussed with the patient. Risks, benefits and expectations were discussed with the patient. Patient understand the risks, benefits and expectations and wishes to proceed with surgery.  PCP: Gwen Pounds, MD   Discharged Condition: good  Hospital Course:  Patient underwent the above stated procedure on 12/06/2016. Patient tolerated the procedure well and brought to the recovery room in good condition and subsequently to the floor.  POD #1 BP: 130/55 ; Pulse: 42 ; Temp: 97.7 F (36.5 C) ; Resp: 16 Patient reports pain as mild.  Reports that she did dome walking already yesterday.  No events.   Does report some mid sternal chest pain that she feels is constant and positional as she can exacerbate it when she abducts her arms. Neurovascular intact and incision: dressing C/D/I - knee immobilizer on.  LABS  Basename    HGB     10.5  HCT     32.6    Discharge Exam: General appearance: alert, cooperative and no distress Extremities: Homans sign is negative, no sign of DVT, no edema, redness or tenderness in the calves or thighs and no ulcers, gangrene or trophic changes  Disposition: Home with follow up in 2 weeks   Follow-up Information    Shelda Pal, MD. Schedule an appointment as soon as possible for a visit in 2 week(s).   Specialty:  Orthopedic Surgery Contact information: [address] [place] [address] [phone]        [facility] Follow up.   Why:  rolling walker Contact information: [address] [place] [address] [phone]        [facility] Follow up.   Specialty:  Home Health Services Why:  home health physical therapy Contact information: [address] [place] [address] [phone]           Discharge Instructions    Call MD / Call 911    Complete by:  As directed    If you experience chest pain or shortness of breath, CALL 911 and be transported to  the hospital emergency room.  If you develope a fever above 101 F, pus (white drainage) or increased drainage or redness at the wound, or calf pain, call your surgeon's office.   Constipation Prevention    Complete by:  As directed    Drink plenty of fluids.  Prune juice may be helpful.  You may use a stool softener, such as Colace (over the counter) 100 mg twice a day.  Use MiraLax (over the counter) for constipation as needed.   Diet - low sodium heart healthy    Complete by:  As directed    Discharge instructions    Complete by:  As directed    Maintain surgical dressing until follow up in the clinic. If the edges  start to pull up, may reinforce with tape. If the dressing is no longer working, may remove and cover with gauze and tape, but must keep the area dry and clean.  [facility] Call with any questions or concerns.   Weight bearing as tolerated    Complete by:  As directed    Weight bearing as tolerated with knee immobilizer in place at all times.   Laterality:  right   Extremity:  Lower      Allergies as of 12/07/2016      Reactions   Celecoxib Rash      Medication List    STOP taking these medications   HYDROcodone-acetaminophen 5-325 MG tablet Commonly known as:  NORCO/VICODIN Replaced by:  HYDROcodone-acetaminophen 7.5-325 MG tablet     TAKE these medications   ALPRAZolam 0.5 MG tablet Commonly known as:  XANAX Take 0.5 mg by mouth 2 (two) times daily.   atenolol 25 MG tablet Commonly known as:  TENORMIN Take 25 mg by mouth every morning.   B-12 PO Take 5,000 mcg by mouth daily. WILL STOP PRIOR TO PROCEDURE   baclofen 10 MG tablet Commonly known as:  LIORESAL Take 1 tablet (10 mg total) by mouth every 8 (eight) hours.   Biotin 5000 MCG Caps Take 5,000 mcg by mouth daily. WILL STOP PRIOR TO PROCEDURE   calcium carbonate 500 MG chewable tablet Commonly known as:  TUMS - dosed in mg elemental calcium Chew 4 tablets by mouth daily as needed for indigestion or heartburn.   calcium-vitamin D 500-200 MG-UNIT tablet Take 2 tablets by mouth daily. WILL STOP PRIOR TO PROCEDURE   CINNAMON PO Take 2,000 mg by mouth 2 (two) times daily. WILL STOP PRIOR TO PROCEDURE   [employer] JO[employer] TRIPLE ACTION 10-5-3.3 MG Tabs Generic drug:  Collagen-Boron-Hyaluronic Acid Take 1 tablet by mouth daily. WILL STOP PRIOR TO PROCEDURE   DEXILANT 60 MG capsule Generic drug:  dexlansoprazole Take 60 mg by mouth daily.   docusate sodium 100 MG capsule Commonly known as:  COLACE Take 100 mg by mouth 2 (two) times daily.   ferrous sulfate 325 (65 FE) MG  tablet Commonly known as:  FERROUSUL Take 1 tablet (325 mg total) by mouth 3 (three) times daily with meals.   FISH OIL PO Take 2 tablets by mouth daily. WILL STOP PRIOR TO PROCEDURE   furosemide 20 MG tablet Commonly known as:  LASIX Take 40 mg by mouth every morning.   gabapentin 600 MG tablet Commonly known as:  NEURONTIN Take 600 mg by mouth 3 (three) times daily.   HYDROcodone-acetaminophen 7.5-325 MG tablet Commonly known as:  NORCO Take 1-2 tablets by mouth every 4 (four) hours as needed  for moderate pain or severe pain. Replaces:  HYDROcodone-acetaminophen 5-325 MG tablet   levothyroxine 50 MCG tablet Commonly known as:  SYNTHROID, LEVOTHROID Take 25 mcg by mouth every Monday, Wednesday, and Friday.   OVER THE COUNTER MEDICATION Take 2 tablets by mouth daily. Multivitamin plus Omega 3 supplement  WILL STOP PRIOR TO PROCEDURE   OVER THE COUNTER MEDICATION Apply 1 application topically 3 (three) times daily as needed (joint pain). [place] Dream Cream- Muscle Spasms   polyethylene glycol packet Commonly known as:  MIRALAX / GLYCOLAX Take 17 g by mouth 2 (two) times daily.   rivaroxaban 20 MG Tabs tablet Commonly known as:  XARELTO Take 20 mg by mouth every evening.   simvastatin 20 MG tablet Commonly known as:  ZOCOR Take 20 mg by mouth every morning.   sucralfate 1 g tablet Commonly known as:  CARAFATE Take 1 tablet (1 g total) by mouth 4 (four) times daily -  with meals and at bedtime.   VITAMIN C PO Take 2 tablets by mouth daily. WILL STOP PRIOR TO PROCEDURE        Signed: Anastasio Auerbach. Aniylah Avans   PA-C  12/10/2016, 2:11 PM

## 2016-12-20 DIAGNOSIS — S76111S Strain of right quadriceps muscle, fascia and tendon, sequela: Secondary | ICD-10-CM | POA: Diagnosis not present

## 2017-01-10 DIAGNOSIS — R2689 Other abnormalities of gait and mobility: Secondary | ICD-10-CM | POA: Diagnosis not present

## 2017-01-10 DIAGNOSIS — F1120 Opioid dependence, uncomplicated: Secondary | ICD-10-CM | POA: Diagnosis not present

## 2017-01-10 DIAGNOSIS — I10 Essential (primary) hypertension: Secondary | ICD-10-CM | POA: Diagnosis not present

## 2017-01-10 DIAGNOSIS — Z7901 Long term (current) use of anticoagulants: Secondary | ICD-10-CM | POA: Diagnosis not present

## 2017-01-10 DIAGNOSIS — E038 Other specified hypothyroidism: Secondary | ICD-10-CM | POA: Diagnosis not present

## 2017-01-10 DIAGNOSIS — Z6841 Body Mass Index (BMI) 40.0 and over, adult: Secondary | ICD-10-CM | POA: Diagnosis not present

## 2017-01-10 DIAGNOSIS — F1320 Sedative, hypnotic or anxiolytic dependence, uncomplicated: Secondary | ICD-10-CM | POA: Diagnosis not present

## 2017-01-10 DIAGNOSIS — G894 Chronic pain syndrome: Secondary | ICD-10-CM | POA: Diagnosis not present

## 2017-01-19 DIAGNOSIS — S76111S Strain of right quadriceps muscle, fascia and tendon, sequela: Secondary | ICD-10-CM | POA: Diagnosis not present

## 2017-01-19 DIAGNOSIS — Z471 Aftercare following joint replacement surgery: Secondary | ICD-10-CM | POA: Diagnosis not present

## 2017-01-19 DIAGNOSIS — Z96651 Presence of right artificial knee joint: Secondary | ICD-10-CM | POA: Diagnosis not present

## 2017-02-01 DIAGNOSIS — R29898 Other symptoms and signs involving the musculoskeletal system: Secondary | ICD-10-CM | POA: Diagnosis not present

## 2017-02-03 DIAGNOSIS — R29898 Other symptoms and signs involving the musculoskeletal system: Secondary | ICD-10-CM | POA: Diagnosis not present

## 2017-02-08 DIAGNOSIS — R29898 Other symptoms and signs involving the musculoskeletal system: Secondary | ICD-10-CM | POA: Diagnosis not present

## 2017-02-10 DIAGNOSIS — R29898 Other symptoms and signs involving the musculoskeletal system: Secondary | ICD-10-CM | POA: Diagnosis not present

## 2017-02-15 DIAGNOSIS — R29898 Other symptoms and signs involving the musculoskeletal system: Secondary | ICD-10-CM | POA: Diagnosis not present

## 2017-02-17 DIAGNOSIS — R29898 Other symptoms and signs involving the musculoskeletal system: Secondary | ICD-10-CM | POA: Diagnosis not present

## 2017-02-21 DIAGNOSIS — R29898 Other symptoms and signs involving the musculoskeletal system: Secondary | ICD-10-CM | POA: Diagnosis not present

## 2017-02-23 DIAGNOSIS — R29898 Other symptoms and signs involving the musculoskeletal system: Secondary | ICD-10-CM | POA: Diagnosis not present

## 2017-03-01 DIAGNOSIS — R29898 Other symptoms and signs involving the musculoskeletal system: Secondary | ICD-10-CM | POA: Diagnosis not present

## 2017-03-03 DIAGNOSIS — R29898 Other symptoms and signs involving the musculoskeletal system: Secondary | ICD-10-CM | POA: Diagnosis not present

## 2017-03-07 DIAGNOSIS — R29898 Other symptoms and signs involving the musculoskeletal system: Secondary | ICD-10-CM | POA: Diagnosis not present

## 2017-03-10 DIAGNOSIS — R29898 Other symptoms and signs involving the musculoskeletal system: Secondary | ICD-10-CM | POA: Diagnosis not present

## 2017-03-17 DIAGNOSIS — R29898 Other symptoms and signs involving the musculoskeletal system: Secondary | ICD-10-CM | POA: Diagnosis not present

## 2017-03-18 DIAGNOSIS — R29898 Other symptoms and signs involving the musculoskeletal system: Secondary | ICD-10-CM | POA: Diagnosis not present

## 2017-03-21 DIAGNOSIS — R29898 Other symptoms and signs involving the musculoskeletal system: Secondary | ICD-10-CM | POA: Diagnosis not present

## 2017-03-24 DIAGNOSIS — R29898 Other symptoms and signs involving the musculoskeletal system: Secondary | ICD-10-CM | POA: Diagnosis not present

## 2017-03-25 NOTE — Addendum Note (Signed)
Addendum  created 03/25/17 1024 by Onell Mcmath, MD   Sign clinical note    

## 2017-03-28 DIAGNOSIS — R29898 Other symptoms and signs involving the musculoskeletal system: Secondary | ICD-10-CM | POA: Diagnosis not present

## 2017-03-31 DIAGNOSIS — R29898 Other symptoms and signs involving the musculoskeletal system: Secondary | ICD-10-CM | POA: Diagnosis not present

## 2017-04-04 DIAGNOSIS — R29898 Other symptoms and signs involving the musculoskeletal system: Secondary | ICD-10-CM | POA: Diagnosis not present

## 2017-04-06 DIAGNOSIS — M25552 Pain in left hip: Secondary | ICD-10-CM | POA: Diagnosis not present

## 2017-04-06 DIAGNOSIS — Z4789 Encounter for other orthopedic aftercare: Secondary | ICD-10-CM | POA: Diagnosis not present

## 2017-04-06 DIAGNOSIS — G5781 Other specified mononeuropathies of right lower limb: Secondary | ICD-10-CM | POA: Diagnosis not present

## 2017-04-06 DIAGNOSIS — S76111S Strain of right quadriceps muscle, fascia and tendon, sequela: Secondary | ICD-10-CM | POA: Diagnosis not present

## 2017-04-08 DIAGNOSIS — R29898 Other symptoms and signs involving the musculoskeletal system: Secondary | ICD-10-CM | POA: Diagnosis not present

## 2017-04-12 DIAGNOSIS — R29898 Other symptoms and signs involving the musculoskeletal system: Secondary | ICD-10-CM | POA: Diagnosis not present

## 2017-04-14 DIAGNOSIS — R29898 Other symptoms and signs involving the musculoskeletal system: Secondary | ICD-10-CM | POA: Diagnosis not present

## 2017-04-19 DIAGNOSIS — R29898 Other symptoms and signs involving the musculoskeletal system: Secondary | ICD-10-CM | POA: Diagnosis not present

## 2017-04-21 DIAGNOSIS — R29898 Other symptoms and signs involving the musculoskeletal system: Secondary | ICD-10-CM | POA: Diagnosis not present

## 2017-04-26 DIAGNOSIS — R29898 Other symptoms and signs involving the musculoskeletal system: Secondary | ICD-10-CM | POA: Diagnosis not present

## 2017-04-28 DIAGNOSIS — R29898 Other symptoms and signs involving the musculoskeletal system: Secondary | ICD-10-CM | POA: Diagnosis not present

## 2017-05-03 DIAGNOSIS — R29898 Other symptoms and signs involving the musculoskeletal system: Secondary | ICD-10-CM | POA: Diagnosis not present

## 2017-05-05 DIAGNOSIS — R29898 Other symptoms and signs involving the musculoskeletal system: Secondary | ICD-10-CM | POA: Diagnosis not present

## 2017-05-10 DIAGNOSIS — R29898 Other symptoms and signs involving the musculoskeletal system: Secondary | ICD-10-CM | POA: Diagnosis not present

## 2017-05-12 DIAGNOSIS — R29898 Other symptoms and signs involving the musculoskeletal system: Secondary | ICD-10-CM | POA: Diagnosis not present

## 2017-05-17 DIAGNOSIS — R29898 Other symptoms and signs involving the musculoskeletal system: Secondary | ICD-10-CM | POA: Diagnosis not present

## 2017-05-18 DIAGNOSIS — Z4789 Encounter for other orthopedic aftercare: Secondary | ICD-10-CM | POA: Diagnosis not present

## 2017-05-18 DIAGNOSIS — S76111S Strain of right quadriceps muscle, fascia and tendon, sequela: Secondary | ICD-10-CM | POA: Diagnosis not present

## 2017-05-24 DIAGNOSIS — R29898 Other symptoms and signs involving the musculoskeletal system: Secondary | ICD-10-CM | POA: Diagnosis not present

## 2017-05-31 DIAGNOSIS — R29898 Other symptoms and signs involving the musculoskeletal system: Secondary | ICD-10-CM | POA: Diagnosis not present

## 2017-06-07 DIAGNOSIS — R29898 Other symptoms and signs involving the musculoskeletal system: Secondary | ICD-10-CM | POA: Diagnosis not present

## 2017-06-09 ENCOUNTER — Ambulatory Visit
Admission: RE | Admit: 2017-06-09 | Discharge: 2017-06-09 | Disposition: A | Discharge status: Home / Self Care | Payer: PPO | Source: Ambulatory Visit | Attending: Internal Medicine | Admitting: Internal Medicine

## 2017-06-09 ENCOUNTER — Other Ambulatory Visit: Payer: Self-pay | Admitting: Internal Medicine

## 2017-06-09 DIAGNOSIS — M4802 Spinal stenosis, cervical region: Secondary | ICD-10-CM | POA: Diagnosis not present

## 2017-06-09 DIAGNOSIS — M5489 Other dorsalgia: Secondary | ICD-10-CM | POA: Diagnosis not present

## 2017-06-09 DIAGNOSIS — R29898 Other symptoms and signs involving the musculoskeletal system: Secondary | ICD-10-CM | POA: Diagnosis not present

## 2017-06-09 DIAGNOSIS — I10 Essential (primary) hypertension: Secondary | ICD-10-CM | POA: Diagnosis not present

## 2017-06-09 DIAGNOSIS — Z6841 Body Mass Index (BMI) 40.0 and over, adult: Secondary | ICD-10-CM | POA: Diagnosis not present

## 2017-06-09 DIAGNOSIS — M545 Low back pain, unspecified: Secondary | ICD-10-CM

## 2017-06-09 DIAGNOSIS — M47814 Spondylosis without myelopathy or radiculopathy, thoracic region: Secondary | ICD-10-CM | POA: Diagnosis not present

## 2017-06-09 DIAGNOSIS — G608 Other hereditary and idiopathic neuropathies: Secondary | ICD-10-CM | POA: Diagnosis not present

## 2017-06-09 DIAGNOSIS — R2689 Other abnormalities of gait and mobility: Secondary | ICD-10-CM | POA: Diagnosis not present

## 2017-06-14 DIAGNOSIS — R29898 Other symptoms and signs involving the musculoskeletal system: Secondary | ICD-10-CM | POA: Diagnosis not present

## 2017-06-29 DIAGNOSIS — Z4789 Encounter for other orthopedic aftercare: Secondary | ICD-10-CM | POA: Diagnosis not present

## 2017-06-29 DIAGNOSIS — S76111S Strain of right quadriceps muscle, fascia and tendon, sequela: Secondary | ICD-10-CM | POA: Diagnosis not present

## 2017-06-30 DIAGNOSIS — H31012 Macula scars of posterior pole (postinflammatory) (post-traumatic), left eye: Secondary | ICD-10-CM | POA: Diagnosis not present

## 2017-06-30 DIAGNOSIS — H524 Presbyopia: Secondary | ICD-10-CM | POA: Diagnosis not present

## 2017-06-30 DIAGNOSIS — H2511 Age-related nuclear cataract, right eye: Secondary | ICD-10-CM | POA: Diagnosis not present

## 2017-06-30 DIAGNOSIS — H25011 Cortical age-related cataract, right eye: Secondary | ICD-10-CM | POA: Diagnosis not present

## 2017-06-30 DIAGNOSIS — Z961 Presence of intraocular lens: Secondary | ICD-10-CM | POA: Diagnosis not present

## 2017-07-07 DIAGNOSIS — E038 Other specified hypothyroidism: Secondary | ICD-10-CM | POA: Diagnosis not present

## 2017-07-07 DIAGNOSIS — M859 Disorder of bone density and structure, unspecified: Secondary | ICD-10-CM | POA: Diagnosis not present

## 2017-07-07 DIAGNOSIS — I10 Essential (primary) hypertension: Secondary | ICD-10-CM | POA: Diagnosis not present

## 2017-07-14 DIAGNOSIS — Z23 Encounter for immunization: Secondary | ICD-10-CM | POA: Diagnosis not present

## 2017-07-14 DIAGNOSIS — Z6841 Body Mass Index (BMI) 40.0 and over, adult: Secondary | ICD-10-CM | POA: Diagnosis not present

## 2017-07-14 DIAGNOSIS — F1320 Sedative, hypnotic or anxiolytic dependence, uncomplicated: Secondary | ICD-10-CM | POA: Diagnosis not present

## 2017-07-14 DIAGNOSIS — F1120 Opioid dependence, uncomplicated: Secondary | ICD-10-CM | POA: Diagnosis not present

## 2017-07-14 DIAGNOSIS — M549 Dorsalgia, unspecified: Secondary | ICD-10-CM | POA: Diagnosis not present

## 2017-07-14 DIAGNOSIS — R2689 Other abnormalities of gait and mobility: Secondary | ICD-10-CM | POA: Diagnosis not present

## 2017-07-14 DIAGNOSIS — Z0000 Encounter for general adult medical examination without abnormal findings: Secondary | ICD-10-CM | POA: Diagnosis not present

## 2017-07-14 DIAGNOSIS — G608 Other hereditary and idiopathic neuropathies: Secondary | ICD-10-CM | POA: Diagnosis not present

## 2017-07-14 DIAGNOSIS — Z1389 Encounter for screening for other disorder: Secondary | ICD-10-CM | POA: Diagnosis not present

## 2017-07-14 DIAGNOSIS — R808 Other proteinuria: Secondary | ICD-10-CM | POA: Diagnosis not present

## 2017-08-03 DIAGNOSIS — M542 Cervicalgia: Secondary | ICD-10-CM | POA: Diagnosis not present

## 2017-08-03 DIAGNOSIS — M546 Pain in thoracic spine: Secondary | ICD-10-CM | POA: Diagnosis not present

## 2017-08-10 ENCOUNTER — Other Ambulatory Visit: Payer: Self-pay | Admitting: Neurological Surgery

## 2017-08-10 DIAGNOSIS — M542 Cervicalgia: Secondary | ICD-10-CM

## 2017-08-10 DIAGNOSIS — M546 Pain in thoracic spine: Secondary | ICD-10-CM

## 2017-08-12 ENCOUNTER — Ambulatory Visit
Admission: RE | Admit: 2017-08-12 | Discharge: 2017-08-12 | Disposition: A | Discharge status: Home / Self Care | Payer: PPO | Source: Ambulatory Visit | Attending: Neurological Surgery | Admitting: Neurological Surgery

## 2017-08-12 DIAGNOSIS — M4804 Spinal stenosis, thoracic region: Secondary | ICD-10-CM | POA: Diagnosis not present

## 2017-08-12 DIAGNOSIS — M546 Pain in thoracic spine: Secondary | ICD-10-CM

## 2017-08-12 DIAGNOSIS — M542 Cervicalgia: Secondary | ICD-10-CM

## 2017-08-12 DIAGNOSIS — M4802 Spinal stenosis, cervical region: Secondary | ICD-10-CM | POA: Diagnosis not present

## 2017-08-18 DIAGNOSIS — M546 Pain in thoracic spine: Secondary | ICD-10-CM | POA: Diagnosis not present

## 2017-08-18 DIAGNOSIS — M542 Cervicalgia: Secondary | ICD-10-CM | POA: Diagnosis not present

## 2017-10-25 DIAGNOSIS — Z01419 Encounter for gynecological examination (general) (routine) without abnormal findings: Secondary | ICD-10-CM | POA: Diagnosis not present

## 2017-10-25 DIAGNOSIS — Z1231 Encounter for screening mammogram for malignant neoplasm of breast: Secondary | ICD-10-CM | POA: Diagnosis not present

## 2017-10-25 DIAGNOSIS — E079 Disorder of thyroid, unspecified: Secondary | ICD-10-CM | POA: Diagnosis not present

## 2017-10-25 DIAGNOSIS — Z6841 Body Mass Index (BMI) 40.0 and over, adult: Secondary | ICD-10-CM | POA: Diagnosis not present

## 2017-10-27 DIAGNOSIS — I10 Essential (primary) hypertension: Secondary | ICD-10-CM | POA: Diagnosis not present

## 2017-10-27 DIAGNOSIS — Z7901 Long term (current) use of anticoagulants: Secondary | ICD-10-CM | POA: Diagnosis not present

## 2017-10-27 DIAGNOSIS — R2689 Other abnormalities of gait and mobility: Secondary | ICD-10-CM | POA: Diagnosis not present

## 2017-10-27 DIAGNOSIS — I517 Cardiomegaly: Secondary | ICD-10-CM | POA: Diagnosis not present

## 2017-10-27 DIAGNOSIS — F1120 Opioid dependence, uncomplicated: Secondary | ICD-10-CM | POA: Diagnosis not present

## 2017-10-27 DIAGNOSIS — E038 Other specified hypothyroidism: Secondary | ICD-10-CM | POA: Diagnosis not present

## 2017-10-27 DIAGNOSIS — K219 Gastro-esophageal reflux disease without esophagitis: Secondary | ICD-10-CM | POA: Diagnosis not present

## 2017-10-28 ENCOUNTER — Other Ambulatory Visit (HOSPITAL_COMMUNITY): Payer: Self-pay | Admitting: Obstetrics and Gynecology

## 2017-10-28 ENCOUNTER — Other Ambulatory Visit: Payer: Self-pay | Admitting: Obstetrics and Gynecology

## 2017-10-28 DIAGNOSIS — E041 Nontoxic single thyroid nodule: Secondary | ICD-10-CM

## 2017-11-01 ENCOUNTER — Other Ambulatory Visit: Payer: PPO

## 2017-11-02 ENCOUNTER — Ambulatory Visit (HOSPITAL_COMMUNITY): Payer: PPO

## 2017-11-02 ENCOUNTER — Ambulatory Visit
Admission: RE | Admit: 2017-11-02 | Discharge: 2017-11-02 | Disposition: A | Discharge status: Home / Self Care | Payer: Medicare HMO | Source: Ambulatory Visit | Attending: Obstetrics and Gynecology | Admitting: Obstetrics and Gynecology

## 2017-11-02 DIAGNOSIS — E041 Nontoxic single thyroid nodule: Secondary | ICD-10-CM | POA: Diagnosis not present

## 2017-11-07 ENCOUNTER — Other Ambulatory Visit: Payer: Self-pay | Admitting: Gastroenterology

## 2017-11-07 DIAGNOSIS — Z86711 Personal history of pulmonary embolism: Secondary | ICD-10-CM | POA: Diagnosis not present

## 2017-11-07 DIAGNOSIS — Z8371 Family history of colonic polyps: Secondary | ICD-10-CM | POA: Diagnosis not present

## 2017-11-07 DIAGNOSIS — K219 Gastro-esophageal reflux disease without esophagitis: Secondary | ICD-10-CM | POA: Diagnosis not present

## 2017-11-07 DIAGNOSIS — Z7901 Long term (current) use of anticoagulants: Secondary | ICD-10-CM | POA: Diagnosis not present

## 2017-11-07 DIAGNOSIS — M545 Low back pain: Secondary | ICD-10-CM | POA: Diagnosis not present

## 2017-11-07 DIAGNOSIS — Z95828 Presence of other vascular implants and grafts: Secondary | ICD-10-CM | POA: Diagnosis not present

## 2017-11-15 DIAGNOSIS — N958 Other specified menopausal and perimenopausal disorders: Secondary | ICD-10-CM | POA: Diagnosis not present

## 2017-11-15 DIAGNOSIS — M8588 Other specified disorders of bone density and structure, other site: Secondary | ICD-10-CM | POA: Diagnosis not present

## 2017-11-16 ENCOUNTER — Other Ambulatory Visit: Payer: Self-pay

## 2017-11-16 ENCOUNTER — Encounter (HOSPITAL_COMMUNITY): Payer: Self-pay | Admitting: Emergency Medicine

## 2017-12-05 ENCOUNTER — Ambulatory Visit (HOSPITAL_COMMUNITY): Payer: Medicare HMO | Admitting: Anesthesiology

## 2017-12-05 ENCOUNTER — Encounter (HOSPITAL_COMMUNITY)
Admission: RE | Disposition: A | Discharge status: Home / Self Care | Payer: Self-pay | Source: Ambulatory Visit | Attending: Gastroenterology

## 2017-12-05 ENCOUNTER — Encounter (HOSPITAL_COMMUNITY): Payer: Self-pay | Admitting: Anesthesiology

## 2017-12-05 ENCOUNTER — Other Ambulatory Visit: Payer: Self-pay

## 2017-12-05 ENCOUNTER — Ambulatory Visit (HOSPITAL_COMMUNITY)
Admission: RE | Admit: 2017-12-05 | Discharge: 2017-12-05 | Disposition: A | Discharge status: Home / Self Care | Payer: Medicare HMO | Source: Ambulatory Visit | Attending: Gastroenterology | Admitting: Gastroenterology

## 2017-12-05 DIAGNOSIS — Z1211 Encounter for screening for malignant neoplasm of colon: Secondary | ICD-10-CM | POA: Diagnosis not present

## 2017-12-05 DIAGNOSIS — M1990 Unspecified osteoarthritis, unspecified site: Secondary | ICD-10-CM | POA: Insufficient documentation

## 2017-12-05 DIAGNOSIS — K219 Gastro-esophageal reflux disease without esophagitis: Secondary | ICD-10-CM | POA: Insufficient documentation

## 2017-12-05 DIAGNOSIS — Z8371 Family history of colonic polyps: Secondary | ICD-10-CM | POA: Insufficient documentation

## 2017-12-05 DIAGNOSIS — Z7901 Long term (current) use of anticoagulants: Secondary | ICD-10-CM | POA: Diagnosis not present

## 2017-12-05 DIAGNOSIS — Z86711 Personal history of pulmonary embolism: Secondary | ICD-10-CM | POA: Diagnosis not present

## 2017-12-05 DIAGNOSIS — Z96611 Presence of right artificial shoulder joint: Secondary | ICD-10-CM | POA: Insufficient documentation

## 2017-12-05 DIAGNOSIS — Z888 Allergy status to other drugs, medicaments and biological substances status: Secondary | ICD-10-CM | POA: Diagnosis not present

## 2017-12-05 DIAGNOSIS — Z79899 Other long term (current) drug therapy: Secondary | ICD-10-CM | POA: Insufficient documentation

## 2017-12-05 DIAGNOSIS — Z86718 Personal history of other venous thrombosis and embolism: Secondary | ICD-10-CM | POA: Diagnosis not present

## 2017-12-05 DIAGNOSIS — Q438 Other specified congenital malformations of intestine: Secondary | ICD-10-CM | POA: Insufficient documentation

## 2017-12-05 DIAGNOSIS — R011 Cardiac murmur, unspecified: Secondary | ICD-10-CM | POA: Insufficient documentation

## 2017-12-05 DIAGNOSIS — E785 Hyperlipidemia, unspecified: Secondary | ICD-10-CM | POA: Insufficient documentation

## 2017-12-05 DIAGNOSIS — Z96642 Presence of left artificial hip joint: Secondary | ICD-10-CM | POA: Insufficient documentation

## 2017-12-05 DIAGNOSIS — I129 Hypertensive chronic kidney disease with stage 1 through stage 4 chronic kidney disease, or unspecified chronic kidney disease: Secondary | ICD-10-CM | POA: Insufficient documentation

## 2017-12-05 DIAGNOSIS — Z6841 Body Mass Index (BMI) 40.0 and over, adult: Secondary | ICD-10-CM | POA: Insufficient documentation

## 2017-12-05 DIAGNOSIS — K317 Polyp of stomach and duodenum: Secondary | ICD-10-CM | POA: Diagnosis not present

## 2017-12-05 DIAGNOSIS — E7800 Pure hypercholesterolemia, unspecified: Secondary | ICD-10-CM | POA: Diagnosis not present

## 2017-12-05 DIAGNOSIS — E039 Hypothyroidism, unspecified: Secondary | ICD-10-CM | POA: Insufficient documentation

## 2017-12-05 DIAGNOSIS — N182 Chronic kidney disease, stage 2 (mild): Secondary | ICD-10-CM | POA: Diagnosis not present

## 2017-12-05 HISTORY — PX: ESOPHAGOGASTRODUODENOSCOPY (EGD) WITH PROPOFOL: SHX5813

## 2017-12-05 HISTORY — PX: COLONOSCOPY WITH PROPOFOL: SHX5780

## 2017-12-05 SURGERY — COLONOSCOPY WITH PROPOFOL
Anesthesia: Monitor Anesthesia Care

## 2017-12-05 MED ORDER — PROPOFOL 10 MG/ML IV BOLUS
INTRAVENOUS | Status: DC | PRN
  Administered 2017-12-05: 50 mg via INTRAVENOUS

## 2017-12-05 MED ORDER — SODIUM CHLORIDE 0.9 % IV SOLN: INTRAVENOUS | Status: DC

## 2017-12-05 MED ORDER — PROPOFOL 10 MG/ML IV BOLUS
INTRAVENOUS | Status: AC
  Filled 2017-12-05: qty 60

## 2017-12-05 MED ORDER — LACTATED RINGERS IV SOLN
INTRAVENOUS | Status: DC
  Administered 2017-12-05: 11:00:00 via INTRAVENOUS

## 2017-12-05 MED ORDER — PROPOFOL 10 MG/ML IV BOLUS
INTRAVENOUS | Status: AC
  Filled 2017-12-05: qty 20

## 2017-12-05 MED ORDER — LIDOCAINE 2% (20 MG/ML) 5 ML SYRINGE
INTRAMUSCULAR | Status: DC | PRN
  Administered 2017-12-05: 60 mg via INTRAVENOUS

## 2017-12-05 MED ORDER — PROPOFOL 500 MG/50ML IV EMUL
INTRAVENOUS | Status: DC | PRN
  Administered 2017-12-05: 125 ug/kg/min via INTRAVENOUS

## 2017-12-05 SURGICAL SUPPLY — 24 items

## 2017-12-05 NOTE — Anesthesia Postprocedure Evaluation (Signed)
Anesthesia Post Note  Patient: SARANN TREGRE  Procedure(s) Performed: COLONOSCOPY WITH PROPOFOL (N/A ) ESOPHAGOGASTRODUODENOSCOPY (EGD) WITH PROPOFOL (N/A )     Patient location during evaluation: PACU Anesthesia Type: MAC Level of consciousness: awake and alert Pain management: pain level controlled Vital Signs Assessment: post-procedure vital signs reviewed and stable Respiratory status: spontaneous breathing and respiratory function stable Cardiovascular status: stable Postop Assessment: no apparent nausea or vomiting Anesthetic complications: no Comments: Difficult IV access.  IV obtained in right a/c with ultrasound.    Last Vitals:  Vitals:   12/05/17 1220 12/05/17 1225  BP: 132/66   Pulse: 61 (!) 54  Resp: 12 14  Temp:    SpO2: 93% 96%    Last Pain:  Vitals:   12/05/17 1209  TempSrc: Oral                 Abreanna Drawdy DANIEL

## 2017-12-05 NOTE — H&P (Signed)
Subjective:   Patient is a 72 y.o. female presents with family history of colon polyps and reflux. She has had a history of pulmonary embolism past and has an IVC filter. She has been on Dexilant and because of her history of pulmonary embolism she has been on Xarelto. She does have some reflux symptoms has been on carafate Pepcid. She did have EGD 2017 showing some hyperplastic polyps has reflux last colonoscopy in 2005 for anemia was negative. She's had a multitude of other problems but given the fact that she has a family history does have reflux symptoms it was felt that she should have colonoscopy will go ahead with the EGD can as well. This was discussed what she and her husband.. Procedure including risks and benefits discussed in office.  Patient Active Problem List   Diagnosis Date Noted  . Rupture of quadriceps tendon 12/06/2016  . Chronic kidney disease, stage II (mild) 07/27/2016  . Hiatal hernia 07/27/2016  . Hypothyroidism 07/27/2016  . Hyperlipidemia 07/27/2016  . History of DVT (deep vein thrombosis) 07/27/2016  . Recurrent pulmonary emboli (HCC) 07/27/2016  . Chronic anticoagulation 07/27/2016  . Chronic lower back pain 07/27/2016  . S/P left hip revision 12/07/2013  . Visit for suture removal 09/25/2013  . Aneurysm of unspecified site [facility]) 08/28/2013   Past Medical History:  Diagnosis Date  . Anemia   . Arthritis    osteoarthritis  . Complication of anesthesia    hard time waking up after her gallbladder surgery 28 years ago  . DVT (deep venous thrombosis) (HCC)    bilateral lungs  . GERD (gastroesophageal reflux disease)    hx of  . Heart murmur   . History of blood transfusion   . History of hiatal hernia    small  . History of kidney stones   . Hypercholesteremia    under control  . Hypertension   . Hypothyroidism   . PONV (postoperative nausea and vomiting)     Past Surgical History:  Procedure Laterality Date  . ABDOMINAL HYSTERECTOMY  1975   partial  . APPENDECTOMY  1973  . BACK SURGERY     X14 1987-2009  . CARDIAC CATHETERIZATION  ~2010  . CARPAL TUNNEL RELEASE Bilateral   . CHOLECYSTECTOMY  1997  . COLONOSCOPY    . DILATION AND CURETTAGE OF UTERUS    . ENDARTERECTOMY Right 08/10/2013   Procedure: Excision of Venous Aneurysm Right Neck;  Surgeon: Larina Earthly, MD;  Location: [facility] OR;  Service: Vascular;  Laterality: Right;  . filter Left 2009   IVC  . HIP SURGERY Left 2008  . JOINT REPLACEMENT Right 11/2003   (knee)multiple surgeries  . JOINT REPLACEMENT Left 12/2009   (knee)  . JOINT REPLACEMENT Left 2011, 2012   Hip  . MASS EXCISION  1974   uterus  . QUADRICEPS TENDON REPAIR Right 12/06/2016   Procedure: QUAD TENDON RECONSTRUCTION;  Surgeon: Durene Romans, MD;  Location: [facility] ORS;  Service: Orthopedics;  Laterality: Right;  Requests for 90 mins  . TONSILLECTOMY  1986  . TOTAL HIP REVISION Left 12/07/2013   Procedure:  REVISION  CONSTRAINED LINER LEFT TOTAL HIP ;  Surgeon: Shelda Pal, MD;  Location: [facility] ORS;  Service: Orthopedics;  Laterality: Left;  . TOTAL SHOULDER REPLACEMENT Right 11/30/2010  . TUBAL LIGATION  1973    Medications Prior to Admission  Medication Sig Dispense Refill Last Dose  . ALPRAZolam (XANAX) 0.5 MG tablet Take 0.5 mg by mouth 2 (two) times  daily.    12/05/2017 at Unknown time  . Ascorbic Acid (VITAMIN C PO) Take 2 tablets by mouth daily.    12/04/2017 at Unknown time  . atenolol (TENORMIN) 25 MG tablet Take 25 mg by mouth every morning.    12/05/2017 at Unknown time  . baclofen (LIORESAL) 10 MG tablet Take 1 tablet (10 mg total) by mouth every 8 (eight) hours. (Patient taking differently: Take 10 mg by mouth 3 (three) times daily. ) 30 each 0 12/04/2017 at Unknown time  . calcium carbonate (TUMS - DOSED IN MG ELEMENTAL CALCIUM) 500 MG chewable tablet Chew 4 tablets by mouth daily as needed for indigestion or heartburn.   Past Week at Unknown time  . Calcium Carbonate-Vitamin D (CALCIUM-VITAMIN D)  500-200 MG-UNIT per tablet Take 2 tablets by mouth daily.    Past Week at Unknown time  . CINNAMON PO Take 1,500 mg by mouth 2 (two) times daily.    Past Week at Unknown time  . Collagen-Boron-Hyaluronic Acid ([employer] JO[employer] TRIPLE ACTION) 10-5-3.3 MG TABS Take 1 tablet by mouth daily.    Past Week at Unknown time  . Cyanocobalamin (B-12 PO) Take 500 mcg by mouth daily.    Past Week at Unknown time  . docusate sodium (COLACE) 100 MG capsule Take 100 mg by mouth 2 (two) times daily.   Past Week at Unknown time  . famotidine (PEPCID AC) 10 MG chewable tablet Chew 20 mg by mouth at bedtime.   12/04/2017 at Unknown time  . furosemide (LASIX) 20 MG tablet Take 40 mg by mouth every morning.    12/04/2017 at Unknown time  . gabapentin (NEURONTIN) 600 MG tablet Take 600 mg by mouth 3 (three) times daily.    12/05/2017 at Unknown time  . HYDROcodone-acetaminophen (NORCO/VICODIN) 5-325 MG tablet Take 1 tablet by mouth 4 (four) times daily as needed for moderate pain.   12/05/2017 at Unknown time  . lansoprazole (PREVACID) 30 MG capsule Take 30 mg by mouth 2 (two) times daily.   12/05/2017 at Unknown time  . levothyroxine (SYNTHROID, LEVOTHROID) 25 MCG tablet Take 25 mcg by mouth every Monday, Wednesday, and Friday.   12/05/2017 at Unknown time  . losartan (COZAAR) 25 MG tablet Take 25 mg by mouth daily as needed (for high BP).   12/04/2017 at Unknown time  . Omega-3 Fatty Acids (FISH OIL PO) Take 2 capsules by mouth daily.    Past Week at Unknown time  . ondansetron (ZOFRAN) 4 MG tablet Take 4 mg by mouth every 8 (eight) hours as needed for nausea or vomiting.   12/04/2017 at Unknown time  . OVER THE COUNTER MEDICATION Take 2 tablets by mouth daily. Multivitamin plus Omega 3 supplement   Past Week at Unknown time  . OVER THE COUNTER MEDICATION Apply 1 application topically 3 (three) times daily as needed (joint pain). [place] Dream Cream- Muscle Spasms    Past Week at Unknown time  . Polyethyl Glycol-Propyl  Glycol (SYSTANE OP) Place 1-2 drops into both eyes at bedtime as needed (for dry eyes).   Past Month at Unknown time  . Probiotic Product (PROBIOTIC PO) Take 1 capsule by mouth daily.   12/04/2017 at Unknown time  . Rivaroxaban (XARELTO) 20 MG TABS tablet Take 20 mg by mouth every evening.   Past Week at Unknown time  . simvastatin (ZOCOR) 20 MG tablet Take 20 mg by mouth at bedtime.    12/04/2017 at Unknown time  . sucralfate (CARAFATE) 1  g tablet Take 1 tablet (1 g total) by mouth 4 (four) times daily -  with meals and at bedtime. (Patient taking differently: Take 1 g by mouth 2 (two) times daily. ) 28 tablet 0 12/05/2017 at Unknown time  . ferrous sulfate (FERROUSUL) 325 (65 FE) MG tablet Take 1 tablet (325 mg total) by mouth 3 (three) times daily with meals. (Patient not taking: Reported on 11/22/2017)   Not Taking at Unknown time  . polyethylene glycol (MIRALAX / GLYCOLAX) packet Take 17 g by mouth 2 (two) times daily. (Patient not taking: Reported on 11/22/2017) 14 each 0 Not Taking at Unknown time   Allergies  Allergen Reactions  . Celecoxib Rash    Social History   Tobacco Use  . Smoking status: Never Smoker  . Smokeless tobacco: Never Used  Substance Use Topics  . Alcohol use: No    Family History  Problem Relation Age of Onset  . Diabetes Mother   . Heart disease Mother        before age 47  . Hyperlipidemia Mother   . Hypertension Mother   . Diabetes Father   . Hyperlipidemia Father   . Hypertension Father   . Heart attack Father   . Peripheral vascular disease Father   . Other Father        amputation  . Diabetes Sister   . Hyperlipidemia Sister   . Hypertension Sister   . Deep vein thrombosis Brother   . Hyperlipidemia Brother   . Hypertension Brother      Objective:   Patient Vitals for the past 8 hrs:  BP Temp Temp src Pulse Resp SpO2 Height Weight  12/05/17 1002 (!) 169/60 98.6 F (37 C) Oral (!) 59 13 95 % 5\' 3"  (1.6 m) 120.2 kg (265 lb)   No intake/output  data recorded. No intake/output data recorded.   See MD Preop evaluation      Assessment:   1. Family history of colon polyps 2. GERD 3. History of pulmonary embolus has been anticoagulated. Xarelto has been on hold for 3 days. She does have an IVC filter.  Plan:   We will plan on going ahead with EGD colonoscopy today this is been discussed again with the patient.

## 2017-12-05 NOTE — Op Note (Signed)
[facility] Patient Name: [name] Procedure Date: 12/05/2017 MRN: [id number] Attending MD: Nancy Fetter Dr., MD Date of Birth: [birth year] CSN: [id number] Age: 72 Admit Type: Outpatient Procedure:                Upper GI endoscopy Indications:              Gastro-esophageal reflux disease, For therapy of                            gastro-esophageal reflux disease Providers:                Jeneen Rinks L. Temperance Kelemen Dr., MD, William Dalton,                            Technician, Elmer Ramp. Hinson, RN Referring MD:              Medicines:                See the other procedure note for documentation of                            the administered medications Complications:            No immediate complications. Estimated Blood Loss:     Estimated blood loss: none. Procedure:                Pre-Anesthesia Assessment:                           - Prior to the procedure, a History and Physical                            was performed, and patient medications and                            allergies were reviewed. The patient's tolerance of                            previous anesthesia was also reviewed. The risks                            and benefits of the procedure and the sedation                            options and risks were discussed with the patient.                            All questions were answered, and informed consent                            was obtained. Prior Anticoagulants: The patient has                            taken Xarelto (rivaroxaban), last dose was 3 days  prior to procedure. ASA Grade Assessment: III - A                            patient with severe systemic disease. After                            reviewing the risks and benefits, the patient was                            deemed in satisfactory condition to undergo the                            procedure.                           After obtaining informed  consent, the endoscope was                            passed under direct vision. Throughout the                            procedure, the patient's blood pressure, pulse, and                            oxygen saturations were monitored continuously. The                            [id number] ([id number]) scope was introduced through the                            mouth, and advanced to the second part of duodenum.                            The upper GI endoscopy was accomplished without                            difficulty. The patient tolerated the procedure                            well. Scope In: Scope Out: Findings:      There is no endoscopic evidence of Barrett's esophagus, esophagitis,       stricture or varices in the entire esophagus.      There is no endoscopic evidence of bleeding, erythema or inflammatory       changes suggestive of gastritis or ulceration in the entire examined       stomach.      A single 3 mm sessile polyp was found in the stomach. Biopsies were       taken with a cold forceps for histology.      The examined duodenum was normal. Impression:               - A single gastric polyp. Biopsied.                           - Normal examined duodenum.                           -  GERD, without esophagitis. Moderate Sedation:      MAC by anesthesia Recommendation:           - Patient has a contact number available for                            emergencies. The signs and symptoms of potential                            delayed complications were discussed with the                            patient. Return to normal activities tomorrow.                            Written discharge instructions were provided to the                            patient.                           - Resume previous diet.                           - Continue present medications.                           - Resume Xarelto (rivaroxaban) at prior dose                             tomorrow.                           - Return to endoscopist PRN. Procedure Code(s):        --- Professional ---                           [phone], Esophagogastroduodenoscopy, flexible,                            transoral; with biopsy, single or multiple Diagnosis Code(s):        --- Professional ---                           K31.7, Polyp of stomach and duodenum                           K21.9, Gastro-esophageal reflux disease without                            esophagitis CPT copyright 2016 American Medical Association. All rights reserved. The codes documented in this report are preliminary and upon coder review may  be revised to meet current compliance requirements. Nancy Fetter Dr., MD 12/05/2017 12:08:58 PM This report has been signed electronically. Number of Addenda: 0

## 2017-12-05 NOTE — Discharge Instructions (Signed)
Resume xarelto tomorrow Note will be send about path

## 2017-12-05 NOTE — Op Note (Signed)
[facility] Patient Name: [name] Procedure Date: 12/05/2017 MRN: [id number] Attending MD: Nancy Fetter Dr., MD Date of Birth: 1946-09-30 CSN: [id number] Age: 72 Admit Type: Outpatient Procedure:                Colonoscopy Indications:              Colon cancer screening in patient at increased                            risk: Family history of 1st-degree relative with                            colon polyps, Last colonoscopy: 2005 Providers:                Joyice Faster. Uma Jerde Dr., MD, William Dalton,                            Technician, Elmer Ramp. Tilden Dome, RN Referring MD:              Medicines:                Monitored Anesthesia Care Complications:            No immediate complications. Estimated Blood Loss:     Estimated blood loss: none. Procedure:                Pre-Anesthesia Assessment:                           - Prior to the procedure, a History and Physical                            was performed, and patient medications and                            allergies were reviewed. The patient's tolerance of                            previous anesthesia was also reviewed. The risks                            and benefits of the procedure and the sedation                            options and risks were discussed with the patient.                            All questions were answered, and informed consent                            was obtained. Prior Anticoagulants: The patient has                            taken Xarelto (rivaroxaban), last dose was 3 days  prior to procedure. ASA Grade Assessment: III - A                            patient with severe systemic disease. After                            reviewing the risks and benefits, the patient was                            deemed in satisfactory condition to undergo the                            procedure.                           - Prior to the procedure, a History  and Physical                            was performed, and patient medications and                            allergies were reviewed. The patient's tolerance of                            previous anesthesia was also reviewed. The risks                            and benefits of the procedure and the sedation                            options and risks were discussed with the patient.                            All questions were answered, and informed consent                            was obtained. Prior Anticoagulants: The patient has                            taken Xarelto (rivaroxaban), last dose was 3 days                            prior to procedure. ASA Grade Assessment: III - A                            patient with severe systemic disease. After                            reviewing the risks and benefits, the patient was                            deemed in satisfactory condition to undergo the  procedure.                           After obtaining informed consent, the colonoscope                            was passed under direct vision. Throughout the                            procedure, the patient's blood pressure, pulse, and                            oxygen saturations were monitored continuously. The                            EC-[id number] ([id number]) scope was introduced through                            the anus and advanced to the the cecum, identified                            by appendiceal orifice and ileocecal valve. The                            colonoscopy was somewhat difficult due to a                            tortuous colon. Successful completion of the                            procedure was aided by applying abdominal pressure.                            The patient tolerated the procedure well. The                            quality of the bowel preparation was adequate to                            identify polyps. The  ileocecal valve, appendiceal                            orifice, and rectum were photographed. Scope In: 11:43:07 AM Scope Out: 12:02:19 PM Scope Withdrawal Time: 0 hours 7 minutes 38 seconds  Total Procedure Duration: 0 hours 19 minutes 12 seconds  Findings:      The perianal and digital rectal examinations were normal.      There is no endoscopic evidence of bleeding, diverticula, mass, polyps       or tumor in the entire colon.      The retroflexed view of the distal rectum and anal verge was normal and       showed no anal or rectal abnormalities. Impression:               - The distal rectum and anal verge are normal on  retroflexion view.                           - No specimens collected.                           - Family History of colon polyps Moderate Sedation:      MAC by anesthesia Recommendation:           - Patient has a contact number available for                            emergencies. The signs and symptoms of potential                            delayed complications were discussed with the                            patient. Return to normal activities tomorrow.                            Written discharge instructions were provided to the                            patient.                           - Resume previous diet.                           - Continue present medications.                           - Resume Xarelto (rivaroxaban) at prior dose                            tomorrow.                           - No repeat colonoscopy due to age.                           - In 5 years begin to test stool for occult blood                            by your primary care MD Procedure Code(s):        --- Professional ---                           G0105, Colorectal cancer screening; colonoscopy on                            individual at high risk Diagnosis Code(s):        --- Professional ---                           Z83.71, Family  history of colonic polyps CPT copyright  2016 American Medical Association. All rights reserved. The codes documented in this report are preliminary and upon coder review may  be revised to meet current compliance requirements. Nancy Fetter Dr., MD 12/05/2017 12:13:17 PM This report has been signed electronically. Number of Addenda: 0

## 2017-12-05 NOTE — Anesthesia Preprocedure Evaluation (Addendum)
Anesthesia Evaluation  Patient identified by MRN, date of birth, ID band Patient awake    Reviewed: Allergy & Precautions, NPO status , Patient's Chart, lab work & pertinent test results  History of Anesthesia Complications (+) PONV and history of anesthetic complications  Airway Mallampati: II  TM Distance: >3 FB Neck ROM: Full    Dental no notable dental hx. (+) Dental Advisory Given   Pulmonary neg pulmonary ROS,    Pulmonary exam normal        Cardiovascular hypertension, Pt. on home beta blockers and Pt. on medications (-) angina(-) DOE  Rhythm:Regular Rate:Normal + Systolic murmurs Study Highlights    The left ventricular ejection fraction is normal (55-65%).  Nuclear stress EF: 63%.  There was no ST segment deviation noted during stress.  Defect 1: There is a large defect of moderate severity present in the basal inferior, mid inferior, mid inferolateral, apical inferior, apical lateral and apex location. This defect is likely due to artifact / chest wall attenuation.  The study is normal.  This is a low risk study.    Dr. Tamala Julian Cardiologist   Neuro/Psych negative neurological ROS  negative psych ROS   GI/Hepatic Neg liver ROS, GERD  Medicated and Controlled,  Endo/Other  Morbid obesity  Renal/GU Renal InsufficiencyRenal disease     Musculoskeletal   Abdominal (+) + obese,   Peds  Hematology   Anesthesia Other Findings   Reproductive/Obstetrics                           Anesthesia Physical  Anesthesia Plan  ASA: III  Anesthesia Plan: MAC   Post-op Pain Management:    Induction:   PONV Risk Score and Plan: 2 and Ondansetron and Propofol infusion  Airway Management Planned: Natural Airway and Simple Face Mask  Additional Equipment:   Intra-op Plan:   Post-operative Plan:   Informed Consent: I have reviewed the patients History and Physical, chart, labs and  discussed the procedure including the risks, benefits and alternatives for the proposed anesthesia with the patient or authorized representative who has indicated his/her understanding and acceptance.   Dental advisory given  Plan Discussed with: CRNA and Surgeon  Anesthesia Plan Comments:         Anesthesia Quick Evaluation

## 2017-12-05 NOTE — Progress Notes (Signed)
Left antecub IV placed by Dr. Krista BlueSinger using ultrasound. Iv infiltrated in procedure after getting some propofol. [facility] [place] [facility]. Warm pack placed. New catheter placed in right  antecub by dr. Krista BlueSinger using ultrasound. Rt Antecub site bruised during placement but no hematoma.  Iv catheter removed. RT antecub site bruised but no hematoma.  Left antecub site with infiltration site is soft and not painful to patient. Site had warm pack on. Not hard to touch and not red or inflamed. Pt to put warm pack on at home.  Pt denies complaints at this time .

## 2017-12-05 NOTE — Transfer of Care (Signed)
Immediate Anesthesia Transfer of Care Note  Patient: [name]  Procedure(s) Performed: COLONOSCOPY WITH PROPOFOL (N/A ) ESOPHAGOGASTRODUODENOSCOPY (EGD) WITH PROPOFOL (N/A )  Patient Location: PACU and Endoscopy Unit  Anesthesia Type:MAC  Level of Consciousness: awake, alert  and oriented  Airway & Oxygen Therapy: Patient Spontanous Breathing and Patient connected to nasal cannula oxygen  Post-op Assessment: Report given to RN and Post -op Vital signs reviewed and stable  Post vital signs: Reviewed and stable  Last Vitals:  Vitals:   12/05/17 1002  BP: (!) 169/60  Pulse: (!) 59  Resp: 13  Temp: 37 C  SpO2: 95%    Last Pain:  Vitals:   12/05/17 1002  TempSrc: Oral         Complications: No apparent anesthesia complications

## 2017-12-08 ENCOUNTER — Encounter (HOSPITAL_COMMUNITY): Payer: Self-pay | Admitting: Gastroenterology

## 2017-12-21 ENCOUNTER — Other Ambulatory Visit: Payer: Self-pay

## 2017-12-21 ENCOUNTER — Emergency Department (HOSPITAL_COMMUNITY): Payer: Medicare HMO

## 2017-12-21 ENCOUNTER — Encounter (HOSPITAL_COMMUNITY): Payer: Self-pay

## 2017-12-21 ENCOUNTER — Emergency Department (HOSPITAL_COMMUNITY)
Admission: EM | Admit: 2017-12-21 | Discharge: 2017-12-21 | Disposition: A | Discharge status: Home / Self Care | Payer: Medicare HMO | Attending: Emergency Medicine | Admitting: Emergency Medicine

## 2017-12-21 DIAGNOSIS — I10 Essential (primary) hypertension: Secondary | ICD-10-CM

## 2017-12-21 DIAGNOSIS — Z7901 Long term (current) use of anticoagulants: Secondary | ICD-10-CM | POA: Insufficient documentation

## 2017-12-21 DIAGNOSIS — E039 Hypothyroidism, unspecified: Secondary | ICD-10-CM | POA: Diagnosis not present

## 2017-12-21 DIAGNOSIS — I129 Hypertensive chronic kidney disease with stage 1 through stage 4 chronic kidney disease, or unspecified chronic kidney disease: Secondary | ICD-10-CM | POA: Diagnosis not present

## 2017-12-21 DIAGNOSIS — N182 Chronic kidney disease, stage 2 (mild): Secondary | ICD-10-CM | POA: Insufficient documentation

## 2017-12-21 DIAGNOSIS — R079 Chest pain, unspecified: Secondary | ICD-10-CM

## 2017-12-21 DIAGNOSIS — Z79899 Other long term (current) drug therapy: Secondary | ICD-10-CM | POA: Insufficient documentation

## 2017-12-21 LAB — BASIC METABOLIC PANEL
ANION GAP: 14 (ref 5–15)
BUN: 9 mg/dL (ref 6–20)
CALCIUM: 10.5 mg/dL — AB (ref 8.9–10.3)
CO2: 28 mmol/L (ref 22–32)
Chloride: 99 mmol/L — ABNORMAL LOW (ref 101–111)
Creatinine, Ser: 1.03 mg/dL — ABNORMAL HIGH (ref 0.44–1.00)
GFR calc Af Amer: >60 mL/min (ref >60)
GFR, EST NON AFRICAN AMERICAN: 53 mL/min — AB (ref >60)
GLUCOSE: 107 mg/dL — AB (ref 65–99)
Potassium: 4 mmol/L (ref 3.5–5.1)
Sodium: 141 mmol/L (ref 135–145)

## 2017-12-21 LAB — CBC
HEMATOCRIT: 42.3 % (ref 36.0–46.0)
HEMOGLOBIN: 13.2 g/dL (ref 12.0–15.0)
MCH: 28.1 pg (ref 26.0–34.0)
MCHC: 31.2 g/dL (ref 30.0–36.0)
MCV: 90 fL (ref 78.0–100.0)
Platelets: 271 10*3/uL (ref 150–400)
RBC: 4.7 MIL/uL (ref 3.87–5.11)
RDW: 13.4 % (ref 11.5–15.5)
WBC: 6.5 10*3/uL (ref 4.0–10.5)

## 2017-12-21 LAB — I-STAT TROPONIN, ED
TROPONIN I, POC: 0 ng/mL (ref 0.00–0.08)
Troponin i, poc: 0 ng/mL (ref 0.00–0.08)

## 2017-12-21 NOTE — ED Notes (Signed)
Patient transported to X-ray 

## 2017-12-21 NOTE — ED Provider Notes (Signed)
   Face-to-face evaluation   History: She presents for evaluation of high blood pressure noted at home when took it.  She took a dose of losartan and her blood pressure improved.  She has had occasional intermittent chest discomfort.  She states that her doctor told her never to take the losartan unless her blood pressure is greater than 120 systolic.  She states that she does not take it because the blood pressure is always greater than 120 systolic.  Physical exam: Obese, alert, cooperative.  Heart regular rate and rhythm no murmur lungs clear anteriorly.  No dysarthria or aphasia.  Medical decision making-it appears that the patient is confused about when to take her losartan.  Losartan is not a medication that can be used intermittently.  it makes more sense to take losartan daily, unless she has blood pressure below 120 systolic.  This should improve her blood pressure control.   Medical screening examination/treatment/procedure(s) were conducted as a shared visit with non-physician practitioner(s) and myself.  I personally evaluated the patient during the encounter    Mancel BaleWentz, Tammatha Cobb, MD 12/21/17 2233

## 2017-12-21 NOTE — ED Provider Notes (Signed)
[facility] EMERGENCY DEPARTMENT Provider Note   CSN: [id number] Arrival date & time: 12/21/17  1105     History   Chief Complaint Chief Complaint  Patient presents with  . Hypertension  . Chest Pain    HPI [name] is a 72 y.o. female with PMH/o GERD, Heart murmur, HTN, who presents for evaluation of hypertension and chest pain.  Patient reports that last night, she checked her blood pressure and states that it read 216/98.  Patient reports that she had taken her atenolol.  Patient reports that after checking her blood pressure, she took losartan.  She reports that she takes atenolol daily but states that losartan she only takes if her blood pressure goes below 120.  Patient states that she did not check her blood pressure again until this morning prior to onset of taking her medications.  This morning, her blood pressure was once again elevated with systolic blood pressure in the 200s.  Patient took both a losartan and atenolol.  She rechecked her blood pressure several hours later and states that it was systolic blood pressure in the 130s.  She had called her primary care doctor who advised coming to the ED for evaluation.  Patient reports that she did have an episode of chest pain earlier today.  She describes the pain as a "sharp pain" under her left breast.  She states that it was not worse with deep inspiration or with exertion.  She states that the pain resolved by itself.  She states that this has been an intermittent issue for the last several months.  She reports that she will intermittently have left-sided chest pain that she describes as a "tightness and a pressure" under her left breast. She states that today's pain was similar to pain she has been experiencing.  She states pain is not worsened with deep inspiration or exertion.  No associated nausea/vomiting/diaphoresis.  No shortness of breath.  She does have a finding of a murmur noted by her gynecologist  and was referred to cardiology.  She has an appointment scheduled then for evaluation of the murmur for possible aortic stenosis.  Patient currently denies any pain.  Patient denies any fever, headache, vision changes, abdominal pain, nausea/vomiting, numbness/weakness of her arms or legs.  Patient has a history of pulmonary embolisms and is on Xarelto.  She has an IVC filter placed.  She denies any shortness of breath.  The history is provided by the patient.    Past Medical History:  Diagnosis Date  . Anemia   . Arthritis    osteoarthritis  . Complication of anesthesia    hard time waking up after her gallbladder surgery 28 years ago  . DVT (deep venous thrombosis) (HCC)    bilateral lungs  . GERD (gastroesophageal reflux disease)    hx of  . Heart murmur   . History of blood transfusion   . History of hiatal hernia    small  . History of kidney stones   . Hypercholesteremia    under control  . Hypertension   . Hypothyroidism   . PONV (postoperative nausea and vomiting)     Patient Active Problem List   Diagnosis Date Noted  . Rupture of quadriceps tendon 12/06/2016  . Chronic kidney disease, stage II (mild) 07/27/2016  . Hiatal hernia 07/27/2016  . Hypothyroidism 07/27/2016  . Hyperlipidemia 07/27/2016  . History of DVT (deep vein thrombosis) 07/27/2016  . Recurrent pulmonary emboli (HCC) 07/27/2016  .  Chronic anticoagulation 07/27/2016  . Chronic lower back pain 07/27/2016  . S/P left hip revision 12/07/2013  . Visit for suture removal 09/25/2013  . Aneurysm of unspecified site [facility]) 08/28/2013    Past Surgical History:  Procedure Laterality Date  . ABDOMINAL HYSTERECTOMY  1975   partial  . APPENDECTOMY  1973  . BACK SURGERY     X14 1987-2009  . CARDIAC CATHETERIZATION  ~2010  . CARPAL TUNNEL RELEASE Bilateral   . CHOLECYSTECTOMY  1997  . COLONOSCOPY    . COLONOSCOPY WITH PROPOFOL N/A 12/05/2017   Procedure: COLONOSCOPY WITH PROPOFOL;  Surgeon: Carman Ching, MD;  Location: [facility] ENDOSCOPY;  Service: Endoscopy;  Laterality: N/A;  . DILATION AND CURETTAGE OF UTERUS    . ENDARTERECTOMY Right 08/10/2013   Procedure: Excision of Venous Aneurysm Right Neck;  Surgeon: Larina Earthly, MD;  Location: [facility] OR;  Service: Vascular;  Laterality: Right;  . ESOPHAGOGASTRODUODENOSCOPY (EGD) WITH PROPOFOL N/A 12/05/2017   Procedure: ESOPHAGOGASTRODUODENOSCOPY (EGD) WITH PROPOFOL;  Surgeon: Carman Ching, MD;  Location: [facility] ENDOSCOPY;  Service: Endoscopy;  Laterality: N/A;  . filter Left 2009   IVC  . HIP SURGERY Left 2008  . JOINT REPLACEMENT Right 11/2003   (knee)multiple surgeries  . JOINT REPLACEMENT Left 12/2009   (knee)  . JOINT REPLACEMENT Left 2011, 2012   Hip  . MASS EXCISION  1974   uterus  . QUADRICEPS TENDON REPAIR Right 12/06/2016   Procedure: QUAD TENDON RECONSTRUCTION;  Surgeon: Durene Romans, MD;  Location: [facility] ORS;  Service: Orthopedics;  Laterality: Right;  Requests for 90 mins  . TONSILLECTOMY  1986  . TOTAL HIP REVISION Left 12/07/2013   Procedure:  REVISION  CONSTRAINED LINER LEFT TOTAL HIP ;  Surgeon: Shelda Pal, MD;  Location: [facility] ORS;  Service: Orthopedics;  Laterality: Left;  . TOTAL SHOULDER REPLACEMENT Right 11/30/2010  . TUBAL LIGATION  1973    OB History    No data available       Home Medications    Prior to Admission medications   Medication Sig Start Date End Date Taking? Authorizing Provider  ALPRAZolam Prudy Feeler) 0.5 MG tablet Take 0.5 mg by mouth 2 (two) times daily.  07/17/13   [provider]  Ascorbic Acid (VITAMIN C PO) Take 2 tablets by mouth daily.     [provider]  atenolol (TENORMIN) 25 MG tablet Take 25 mg by mouth every morning.  06/11/13   [provider]  baclofen (LIORESAL) 10 MG tablet Take 1 tablet (10 mg total) by mouth every 8 (eight) hours. Patient taking differently: Take 10 mg by mouth 3 (three) times daily.  12/07/13   Lanney Gins, PA-C  calcium carbonate (TUMS -  DOSED IN MG ELEMENTAL CALCIUM) 500 MG chewable tablet Chew 4 tablets by mouth daily as needed for indigestion or heartburn.    [provider]  Calcium Carbonate-Vitamin D (CALCIUM-VITAMIN D) 500-200 MG-UNIT per tablet Take 2 tablets by mouth daily.     [provider]  CINNAMON PO Take 1,500 mg by mouth 2 (two) times daily.     [provider]  Collagen-Boron-Hyaluronic Acid ([employer] TRIPLE ACTION) 10-5-3.3 MG TABS Take 1 tablet by mouth daily.     [provider]  Cyanocobalamin (B-12 PO) Take 500 mcg by mouth daily.     [provider]  docusate sodium (COLACE) 100 MG capsule Take 100 mg by mouth 2 (two) times daily.    [provider]  famotidine (  PEPCID AC) 10 MG chewable tablet Chew 20 mg by mouth at bedtime.    [provider]  ferrous sulfate (FERROUSUL) 325 (65 FE) MG tablet Take 1 tablet (325 mg total) by mouth 3 (three) times daily with meals. Patient not taking: Reported on 11/22/2017 12/06/16   Lanney GinsBabish, Matthew, PA-C  furosemide (LASIX) 20 MG tablet Take 40 mg by mouth every morning.  05/28/13   [provider]  gabapentin (NEURONTIN) 600 MG tablet Take 600 mg by mouth 3 (three) times daily.  05/25/13   [provider]  HYDROcodone-acetaminophen (NORCO/VICODIN) 5-325 MG tablet Take 1 tablet by mouth 4 (four) times daily as needed for moderate pain.    [provider]  lansoprazole (PREVACID) 30 MG capsule Take 30 mg by mouth 2 (two) times daily.    [provider]  levothyroxine (SYNTHROID, LEVOTHROID) 25 MCG tablet Take 25 mcg by mouth every Monday, Wednesday, and Friday.    [provider]  losartan (COZAAR) 25 MG tablet Take 25 mg by mouth daily as needed (for high BP).    [provider]  Omega-3 Fatty Acids (FISH OIL PO) Take 2 capsules by mouth daily.     [provider]  ondansetron (ZOFRAN) 4 MG tablet Take 4 mg by mouth every 8 (eight) hours as needed  for nausea or vomiting.    [provider]  OVER THE COUNTER MEDICATION Take 2 tablets by mouth daily. Multivitamin plus Omega 3 supplement    [provider]  OVER THE COUNTER MEDICATION Apply 1 application topically 3 (three) times daily as needed (joint pain). [place] Dream Cream- Muscle Spasms     [provider]  Polyethyl Glycol-Propyl Glycol (SYSTANE OP) Place 1-2 drops into both eyes at bedtime as needed (for dry eyes).    [provider]  polyethylene glycol (MIRALAX / GLYCOLAX) packet Take 17 g by mouth 2 (two) times daily. Patient not taking: Reported on 11/22/2017 12/06/16   Lanney GinsBabish, Matthew, PA-C  Probiotic Product (PROBIOTIC PO) Take 1 capsule by mouth daily.    [provider]  Rivaroxaban (XARELTO) 20 MG TABS tablet Take 20 mg by mouth every evening. 08/11/13   Larina EarthlyEarly, Todd F, MD  simvastatin (ZOCOR) 20 MG tablet Take 20 mg by mouth at bedtime.  05/28/13   [provider]  sucralfate (CARAFATE) 1 g tablet Take 1 tablet (1 g total) by mouth 4 (four) times daily -  with meals and at bedtime. Patient taking differently: Take 1 g by mouth 2 (two) times daily.  04/13/16   Linwood DibblesKnapp, Jon, MD    Family History Family History  Problem Relation Age of Onset  . Diabetes Mother   . Heart disease Mother        before age 72  . Hyperlipidemia Mother   . Hypertension Mother   . Diabetes Father   . Hyperlipidemia Father   . Hypertension Father   . Heart attack Father   . Peripheral vascular disease Father   . Other Father        amputation  . Diabetes Sister   . Hyperlipidemia Sister   . Hypertension Sister   . Deep vein thrombosis Brother   . Hyperlipidemia Brother   . Hypertension Brother     Social History Social History   Tobacco Use  . Smoking status: Never Smoker  . Smokeless tobacco: Never Used  Substance Use Topics  . Alcohol use: No  . Drug use: No     Allergies  Celecoxib   Review of Systems Review of  Systems  Constitutional: Negative for chills and fever.  HENT: Negative for congestion.   Eyes: Negative for visual disturbance.  Respiratory: Negative for cough and shortness of breath.   Cardiovascular: Positive for chest pain.  Gastrointestinal: Negative for abdominal pain, diarrhea, nausea and vomiting.  Genitourinary: Negative for dysuria and hematuria.  Musculoskeletal: Negative for back pain and neck pain.  Skin: Negative for rash.  Neurological: Negative for dizziness, weakness, numbness and headaches.  Psychiatric/Behavioral: Negative for confusion.  All other systems reviewed and are negative.    Physical Exam Updated Vital Signs BP (!) 150/71   Pulse (!) 51   Temp 98.6 F (37 C) (Oral)   Resp (!) 9   Ht 5\' 3"  (1.6 m)   Wt 122.5 kg (270 lb)   SpO2 94%   BMI 47.83 kg/m   Physical Exam  Constitutional: She is oriented to person, place, and time. She appears well-developed and well-nourished.  HENT:  Head: Normocephalic and atraumatic.  Mouth/Throat: Oropharynx is clear and moist and mucous membranes are normal.  Eyes: Conjunctivae, EOM and lids are normal. Pupils are equal, round, and reactive to light.  Neck: Full passive range of motion without pain.  Cardiovascular: Normal rate, regular rhythm and normal pulses. Exam reveals no gallop and no friction rub.  Murmur heard.  Systolic murmur is present. Pulses:      Radial pulses are 2+ on the right side, and 2+ on the left side.       Dorsalis pedis pulses are 2+ on the right side, and 2+ on the left side.  Pulmonary/Chest: Effort normal and breath sounds normal.  No evidence of respiratory distress. Able to speak in full sentences without difficulty.  Abdominal: Soft. Normal appearance. There is no tenderness. There is no rigidity and no guarding.  Musculoskeletal: Normal range of motion.  Non-pitting edema noted to BLE. BLE are symmetric in appearance.   Neurological: She is alert and oriented to person, place,  and time.  Cranial nerves III-XII intact Follows commands, Moves all extremities  5/5 strength to BUE and BLE  Sensation intact throughout all major nerve distributions Normal finger to nose. No dysdiadochokinesia. No pronator drift. No gait abnormalities  No slurred speech. No facial droop.   Skin: Skin is warm and dry. Capillary refill takes less than 2 seconds.  Psychiatric: She has a normal mood and affect. Her speech is normal.  Nursing note and vitals reviewed.    ED Treatments / Results  Labs (all labs ordered are listed, but only abnormal results are displayed) Labs Reviewed  BASIC METABOLIC PANEL - Abnormal; Notable for the following components:      Result Value   Chloride 99 (*)    Glucose, Bld 107 (*)    Creatinine, Ser 1.03 (*)    Calcium 10.5 (*)    GFR calc non Af Amer 53 (*)    All other components within normal limits  CBC  I-STAT TROPONIN, ED  I-STAT TROPONIN, ED    EKG  EKG Interpretation  Date/Time:  Wednesday December 21 2017 11:36:47 EST Ventricular Rate:  60 PR Interval:  180 QRS Duration: 86 QT Interval:  386 QTC Calculation: 386 R Axis:   -27 Text Interpretation:  Normal sinus rhythm Moderate voltage criteria for LVH, may be normal variant Borderline ECG since last tracing no significant change Confirmed by Mancel Bale [phone]) on 12/21/2017 3:17:32 PM       Radiology Dg Chest  2 View  Result Date: 12/21/2017 CLINICAL DATA:  Chest pain.  Hypertension. EXAM: CHEST - 2 VIEW COMPARISON:  04/13/2016 FINDINGS: Heart size and pulmonary vascularity are normal the lungs are clear. No effusions. Previous fusion in the lower thoracic and upper lumbar spine. Calcification in the arch of the aorta. Right total shoulder prosthesis. IMPRESSION: No acute abnormalities. Aortic Atherosclerosis (ICD10-I70.0). Electronically Signed   By: Francene Boyers M.D.   On: 12/21/2017 13:01    Procedures Procedures (including critical care time)  Medications Ordered in  ED Medications - No data to display   Initial Impression / Assessment and Plan / ED Course  I have reviewed the triage vital signs and the nursing notes.  Pertinent labs & imaging results that were available during my care of the patient were reviewed by me and considered in my medical decision making (see chart for details).  Clinical Course as of Dec 22 1846  Wed Dec 21, 2017  1516 HCT: 42.3 [EW]    Clinical Course User Index [EW] Mancel Bale, MD    72 y.o. female who presents for evaluation of high blood pressure and chest pain.  Patient checked her blood pressure last night and was elevated at 2 6/98.  She is on atenolol and losartan but states that she was only told to take her losartan if her blood pressure drops below 120.  Did have an episode of chest pain today but states that this is been ongoing issue for the last month.  She reports a month long history of intermittent chest pain underneath the left breast.  Not worse with exertion or deep inspiration.  Patient does have history of PEs and has an IVC filter in place.  She is currently on Xarelto.  On ED arrival, patient is afebrile, heart rate is in the 50s.  Patient's blood pressure is within normal limits.  Patient denying any symptoms at this time.  There is a murmur heard on exam but patient's review of chart shows that she has a history of a murmur.  Patient reports that she was diagnosed with a murmur by her gynecologist and is set to follow-up with cardiology on 3/20 for evaluation of murmur.  At this time do not suspect hypertensive emergency.  A low suspicion for ACS etiology but a consideration given patient's history/physical exam.  History/physical exam is not concerning for PE.  Will plan to check basic labs, EKG, CXR.  Initial troponin negative.  CBC is without any significant leukocytosis or anemia.  BMP shows slight bump in creatinine but otherwise stable.  Chest x-ray negative for any acute infectious  etiology.  Discussed patient with Dr. Effie Shy who was apparently evaluated patient in the ED.  There is concerned that patient is supposed be taking labetalol daily and that patient has not been taking her blood pressure medications appropriately which would explain elevations in blood pressure.  Given patient's risk factors and histort, he has a a heart score of 4. Will plan to delta troponin.  Do not suspect acute ACS etiology as patient's pain has been intermittent for the last several months and is not worse with exertion, inspiration.  Do not suspect angina pain.  Repeat troponin negative.  Discussed with Dr. Effie Shy.  Patient stable for outpatient follow-up.  She has an appointment scheduled with cardiology on 12/17/17.  Patient is currently denying any symptoms at this time.  Blood pressure is slightly elevated.  Instructed patient to take losartan daily until she is able to follow-up  her primary care doctor. Patient had ample opportunity for questions and discussion. All patient's questions were answered with full understanding. Strict return precautions discussed. Patient expresses understanding and agreement to plan.   Final Clinical Impressions(s) / ED Diagnoses   Final diagnoses:  Hypertension, unspecified type  Chest pain, unspecified type    ED Discharge Orders    None       Rosana Hoes 12/21/17 Jonni Sanger, MD 12/21/17 2233

## 2017-12-21 NOTE — ED Triage Notes (Signed)
PT reports feeling flushed with headache last night so she checked her bp and it was 216/98. PT reports hx of htn and takes atenolol daily and losartan as needed. PT reports taking bp medication and pressure coming down but going back up soon after. PT endorses chest tightness this morning, but none at this time.

## 2017-12-21 NOTE — Discharge Instructions (Signed)
As we discussed, take both your losartan and atenolol daily.  Follow-up with your primary care doctor in the next 2-4 days for further evaluation.  Return to the Emergency Department immediately if you experiencing worsening chest pain, difficulty breathing, nausea/vomiting, get very sweaty, vision changes, headache or any other worsening or concerning symptoms.

## 2017-12-21 NOTE — ED Notes (Signed)
Patient verbalizes understanding of discharge instructions. Opportunity for questioning and answers were provided. Armband removed by staff, pt discharged from ED ambulatory.   

## 2017-12-23 DIAGNOSIS — I10 Essential (primary) hypertension: Secondary | ICD-10-CM | POA: Diagnosis not present

## 2017-12-23 DIAGNOSIS — Z6841 Body Mass Index (BMI) 40.0 and over, adult: Secondary | ICD-10-CM | POA: Diagnosis not present

## 2018-01-03 NOTE — Progress Notes (Signed)
CARDIOLOGY OFFICE NOTE  Date:  01/04/2018    [name] Date of Birth: [birth year] Medical Record #[id number]  PCP:  Creola Corn, MD  Cardiologist:  Katrinka Blazing    Chief Complaint  Patient presents with  . Heart Murmur    Work in visit - seen for Dr. Katrinka Blazing    History of Present Illness: [name] is a 71 y.o. female who presents today for a work in visit. Seen for Dr. Katrinka Blazing.   She has a history of recurrent PE, inferior vena caval filter, recurrent DVT, & hiatal hernia.   She was seen here by Dr. Katrinka Blazing back in 2017 for chest pain. This was not felt to be cardiac.   In the ER earlier this month with an elevated BP - using Losartan intermittently.   Comes in today. Here with her husband. She is friends with Theresia Lo that I know. She says she is here because "everyone keeps asking me what I'm doing about this murmur". She notes that she has chronic shortness of breath - she attributes to her weight - may be getting worse. Some sharp chest pain if she bends over. She will note some pressure in her chest sometimes when she is lying down. She has shoulder issues, GERD, hiatal hernia and really "does not know what is causing what".  She has not had syncope. She has a history of multiple falls - none over the past year. Husband thinks she has had a few lightheaded spells. She remains on chronic Xarelto. She notes better BP control at home. She is now taking her ARB regularly.   Past Medical History:  Diagnosis Date  . Anemia   . Arthritis    osteoarthritis  . Complication of anesthesia    hard time waking up after her gallbladder surgery 28 years ago  . DVT (deep venous thrombosis) (HCC)    bilateral lungs  . GERD (gastroesophageal reflux disease)    hx of  . Heart murmur   . History of blood transfusion   . History of hiatal hernia    small  . History of kidney stones   . Hypercholesteremia    under control  . Hypertension   . Hypothyroidism   . PONV  (postoperative nausea and vomiting)     Past Surgical History:  Procedure Laterality Date  . ABDOMINAL HYSTERECTOMY  1975   partial  . APPENDECTOMY  1973  . BACK SURGERY     X14 1987-2009  . CARDIAC CATHETERIZATION  ~2010  . CARPAL TUNNEL RELEASE Bilateral   . CHOLECYSTECTOMY  1997  . COLONOSCOPY    . COLONOSCOPY WITH PROPOFOL N/A 12/05/2017   Procedure: COLONOSCOPY WITH PROPOFOL;  Surgeon: Carman Ching, MD;  Location: [facility];  Service: Endoscopy;  Laterality: N/A;  . DILATION AND CURETTAGE OF UTERUS    . ENDARTERECTOMY Right 08/10/2013   Procedure: Excision of Venous Aneurysm Right Neck;  Surgeon: Larina Earthly, MD;  Location: [facility] OR;  Service: Vascular;  Laterality: Right;  . ESOPHAGOGASTRODUODENOSCOPY (EGD) WITH PROPOFOL N/A 12/05/2017   Procedure: ESOPHAGOGASTRODUODENOSCOPY (EGD) WITH PROPOFOL;  Surgeon: Carman Ching, MD;  Location: [facility];  Service: Endoscopy;  Laterality: N/A;  . filter Left 2009   IVC  . HIP SURGERY Left 2008  . JOINT REPLACEMENT Right 11/2003   (knee)multiple surgeries  . JOINT REPLACEMENT Left 12/2009   (knee)  . JOINT REPLACEMENT Left 2011, 2012   Hip  . MASS EXCISION  1974   uterus  .  QUADRICEPS TENDON REPAIR Right 12/06/2016   Procedure: QUAD TENDON RECONSTRUCTION;  Surgeon: Durene RomansMatthew Olin, MD;  Location: [facility];  Service: Orthopedics;  Laterality: Right;  Requests for 90 mins  . TONSILLECTOMY  1986  . TOTAL HIP REVISION Left 12/07/2013   Procedure:  REVISION  CONSTRAINED LINER LEFT TOTAL HIP ;  Surgeon: Shelda PalMatthew D Olin, MD;  Location: [facility];  Service: Orthopedics;  Laterality: Left;  . TOTAL SHOULDER REPLACEMENT Right 11/30/2010  . TUBAL LIGATION  1973     Medications: Current Meds  Medication Sig  . ALPRAZolam (XANAX) 0.5 MG tablet Take 0.5 mg by mouth 2 (two) times daily.   . Ascorbic Acid (VITAMIN C PO) Take 2 tablets by mouth daily.   Marland Kitchen. atenolol (TENORMIN) 25 MG tablet Take 25 mg by mouth every morning.   . baclofen (LIORESAL)  10 MG tablet Take 1 tablet (10 mg total) by mouth every 8 (eight) hours. (Patient taking differently: Take 10 mg by mouth 3 (three) times daily. )  . CINNAMON PO Take 1,500 mg by mouth 2 (two) times daily.   . Collagen-Boron-Hyaluronic Acid ([employer] JOINT HEALTH TRIPLE ACTION) 10-5-3.3 MG TABS Take 1 tablet by mouth daily.   . Cyanocobalamin (B-12 PO) Take 500 mcg by mouth daily.   Marland Kitchen. docusate sodium (COLACE) 100 MG capsule Take 100 mg by mouth daily.   . famotidine (PEPCID AC) 10 MG chewable tablet Chew 20 mg by mouth at bedtime.  . furosemide (LASIX) 20 MG tablet Take 40 mg by mouth 2 (two) times daily.   Marland Kitchen. gabapentin (NEURONTIN) 600 MG tablet Take 600 mg by mouth 3 (three) times daily.   Marland Kitchen. HYDROcodone-acetaminophen (NORCO/VICODIN) 5-325 MG tablet Take 1 tablet by mouth every 6 (six) hours as needed for moderate pain.   Marland Kitchen. levothyroxine (SYNTHROID, LEVOTHROID) 25 MCG tablet Take 25 mcg by mouth every Monday, Wednesday, and Friday.  . losartan (COZAAR) 25 MG tablet Take 25 mg by mouth daily.   . Omega-3 Fatty Acids (FISH OIL PO) Take 2 capsules by mouth daily.   . ondansetron (ZOFRAN) 4 MG tablet Take 4 mg by mouth every 8 (eight) hours as needed for nausea or vomiting.  Marland Kitchen. OVER THE COUNTER MEDICATION Take 2 tablets by mouth daily. Multivitamin plus Omega 3 supplement  . OVER THE COUNTER MEDICATION Apply 1 application topically 3 (three) times daily as needed (joint pain). [place] Dream Cream- Muscle Spasms   . pantoprazole (PROTONIX) 40 MG tablet Take 40 mg by mouth daily.   Bertram Gala. Polyethyl Glycol-Propyl Glycol (SYSTANE OP) Place 1-2 drops into both eyes at bedtime as needed (for dry eyes).  . Probiotic Product (PROBIOTIC PO) Take 1 capsule by mouth daily.  . Rivaroxaban (XARELTO) 20 MG TABS tablet Take 20 mg by mouth every evening.  . simvastatin (ZOCOR) 20 MG tablet Take 20 mg by mouth at bedtime.   . sucralfate (CARAFATE) 1 g tablet Take 1 tablet (1 g total) by mouth 4 (four) times daily -  with  meals and at bedtime. (Patient taking differently: Take 1 g by mouth 2 (two) times daily. )     Allergies: Allergies  Allergen Reactions  . Celecoxib Rash    Social History: The patient  reports that  has never smoked. she has never used smokeless tobacco. She reports that she does not drink alcohol or use drugs.   Family History: The patient's family history includes Deep vein thrombosis in her brother; Diabetes in her father, mother, and sister; Heart attack in  her father; Heart disease in her mother; Hyperlipidemia in her brother, father, mother, and sister; Hypertension in her brother, father, mother, and sister; Other in her father; Peripheral vascular disease in her father.   Review of Systems: Please see the history of present illness.   Otherwise, the review of systems is positive for none.   All other systems are reviewed and negative.   Physical Exam: VS:  BP 140/90 (BP Location: Left Arm, Patient Position: Sitting, Cuff Size: Normal)   Pulse (!) 56   Ht 5\' 3"  (1.6 m)   Wt 275 lb 12.8 oz (125.1 kg)   SpO2 96%   BMI 48.86 kg/m  .  BMI Body mass index is 48.86 kg/m.  Wt Readings from Last 3 Encounters:  01/04/18 275 lb 12.8 oz (125.1 kg)  12/21/17 270 lb (122.5 kg)  12/05/17 265 lb (120.2 kg)    General: Pleasant. Morbidly obese. Alert and in no acute distress.   HEENT: Normal.  Neck: Supple, no JVD, carotid bruits, or masses noted.  Cardiac: Regular rate and rhythm. Harsh outflow murmur noted. Legs are quite full but with no significant edema.  Respiratory:  Lungs are clear to auscultation bilaterally with normal work of breathing.  GI: Soft and nontender.  MS: No deformity or atrophy. Gait and ROM intact.  Skin: Warm and dry. Color is normal.  Neuro:  Strength and sensation are intact and no gross focal deficits noted.  Psych: Alert, appropriate and with normal affect.   LABORATORY DATA:  EKG:  EKG is not ordered today.  Lab Results  Component Value Date     WBC 6.5 12/21/2017   HGB 13.2 12/21/2017   HCT 42.3 12/21/2017   PLT 271 12/21/2017   GLUCOSE 107 (H) 12/21/2017   ALT 19 08/08/2013   AST 19 08/08/2013   NA 141 12/21/2017   K 4.0 12/21/2017   CL 99 (L) 12/21/2017   CREATININE 1.03 (H) 12/21/2017   BUN 9 12/21/2017   CO2 28 12/21/2017   INR 1.34 04/13/2016     BNP (last 3 results) No results for input(s): BNP in the last 8760 hours.  ProBNP (last 3 results) No results for input(s): PROBNP in the last 8760 hours.   Other Studies Reviewed Today:  Myoview Study Highlights 2017   The left ventricular ejection fraction is normal (55-65%).  Nuclear stress EF: 63%.  There was no ST segment deviation noted during stress.  Defect 1: There is a large defect of moderate severity present in the basal inferior, mid inferior, mid inferolateral, apical inferior, apical lateral and apex location. This defect is likely due to artifact / chest wall attenuation.  The study is normal.  This is a low risk study.     Echo Study Conclusions 04/2013  - Left ventricle: The cavity size was normal. There was mild concentric hypertrophy. Systolic function was normal. The estimated ejection fraction was in the range of 60% to 65%. Wall motion was normal; there were no regional wall motion abnormalities. Left ventricular diastolic function parameters were normal. - Mitral valve: Mildly thickened leaflets . Trivial regurgitation. - Left atrium: The atrium was normal in size. - Inferior vena cava: The vessel was dilated; the respirophasic diameter changes were blunted (< 50%); findings are consistent with elevated central venous pressure. - Pericardium, extracardiac: There was no pericardial effusion.   Assessment/Plan:  1. Murmur - sounds like aortic stenosis. Some atypical symptoms. Will get an echo - further disposition to follow.   2.  History of PE - she has a filter in place - she remains on chronic  anticoagulation  3. Hiatal Hernia/GERD  4. Chronic anticoagulation  5. HTN - reports better control at home.    Current medicines are reviewed with the patient today.  The patient does not have concerns regarding medicines other than what has been noted above.  The following changes have been made:  See above.  Labs/ tests ordered today include:    Orders Placed This Encounter  Procedures  . ECHOCARDIOGRAM COMPLETE     Disposition:   Further disposition to follow.    Patient is agreeable to this plan and will call if any problems develop in the interim.   SignedNorma Fredrickson, NP  01/04/2018 4:07 PM  [facility] [address] [place], [place]  [address] Phone: [phone] Fax: [phone]

## 2018-01-04 ENCOUNTER — Ambulatory Visit: Payer: Medicare HMO | Admitting: Nurse Practitioner

## 2018-01-04 ENCOUNTER — Encounter: Payer: Self-pay | Admitting: Nurse Practitioner

## 2018-01-04 VITALS — BP 140/90 | HR 56 | Ht 63.0 in | Wt 275.8 lb

## 2018-01-04 DIAGNOSIS — R011 Cardiac murmur, unspecified: Secondary | ICD-10-CM

## 2018-01-04 NOTE — Patient Instructions (Addendum)
We will be checking the following labs today - NONE   Medication Instructions:    Continue with your current medicines.     Testing/Procedures To Be Arranged:  Echocardiogram  Follow-Up:   Will see how your echo turns out and then decide about follow up.     Other Special Instructions:   N/A    If you need a refill on your cardiac medications before your next appointment, please call your pharmacy.   Call the [facility] office at [phone] if you have any questions, problems or concerns.

## 2018-01-05 DIAGNOSIS — Z6841 Body Mass Index (BMI) 40.0 and over, adult: Secondary | ICD-10-CM | POA: Diagnosis not present

## 2018-01-05 DIAGNOSIS — Z7901 Long term (current) use of anticoagulants: Secondary | ICD-10-CM | POA: Diagnosis not present

## 2018-01-05 DIAGNOSIS — R011 Cardiac murmur, unspecified: Secondary | ICD-10-CM | POA: Diagnosis not present

## 2018-01-05 DIAGNOSIS — I10 Essential (primary) hypertension: Secondary | ICD-10-CM | POA: Diagnosis not present

## 2018-01-12 ENCOUNTER — Ambulatory Visit (HOSPITAL_COMMUNITY): Payer: Medicare HMO | Attending: Cardiology

## 2018-01-12 ENCOUNTER — Other Ambulatory Visit: Payer: Self-pay

## 2018-01-12 DIAGNOSIS — R011 Cardiac murmur, unspecified: Secondary | ICD-10-CM | POA: Insufficient documentation

## 2018-01-12 MED ORDER — PERFLUTREN LIPID MICROSPHERE
1.0000 mL | INTRAVENOUS | Status: AC | PRN
  Administered 2018-01-12: 1 mL via INTRAVENOUS

## 2018-01-17 DIAGNOSIS — I10 Essential (primary) hypertension: Secondary | ICD-10-CM | POA: Diagnosis not present

## 2018-01-17 DIAGNOSIS — H6121 Impacted cerumen, right ear: Secondary | ICD-10-CM | POA: Diagnosis not present

## 2018-01-17 DIAGNOSIS — J329 Chronic sinusitis, unspecified: Secondary | ICD-10-CM | POA: Diagnosis not present

## 2018-01-17 DIAGNOSIS — R05 Cough: Secondary | ICD-10-CM | POA: Diagnosis not present

## 2018-01-17 DIAGNOSIS — H748X1 Other specified disorders of right middle ear and mastoid: Secondary | ICD-10-CM | POA: Diagnosis not present

## 2018-01-17 DIAGNOSIS — Z6841 Body Mass Index (BMI) 40.0 and over, adult: Secondary | ICD-10-CM | POA: Diagnosis not present

## 2018-03-14 DIAGNOSIS — Z7901 Long term (current) use of anticoagulants: Secondary | ICD-10-CM | POA: Diagnosis not present

## 2018-03-14 DIAGNOSIS — M1990 Unspecified osteoarthritis, unspecified site: Secondary | ICD-10-CM | POA: Diagnosis not present

## 2018-03-14 DIAGNOSIS — R011 Cardiac murmur, unspecified: Secondary | ICD-10-CM | POA: Diagnosis not present

## 2018-03-14 DIAGNOSIS — I10 Essential (primary) hypertension: Secondary | ICD-10-CM | POA: Diagnosis not present

## 2018-03-14 DIAGNOSIS — J069 Acute upper respiratory infection, unspecified: Secondary | ICD-10-CM | POA: Diagnosis not present

## 2018-03-14 DIAGNOSIS — F1120 Opioid dependence, uncomplicated: Secondary | ICD-10-CM | POA: Diagnosis not present

## 2018-03-14 DIAGNOSIS — F1320 Sedative, hypnotic or anxiolytic dependence, uncomplicated: Secondary | ICD-10-CM | POA: Diagnosis not present

## 2018-03-14 DIAGNOSIS — R2689 Other abnormalities of gait and mobility: Secondary | ICD-10-CM | POA: Diagnosis not present

## 2018-07-03 DIAGNOSIS — H524 Presbyopia: Secondary | ICD-10-CM | POA: Diagnosis not present

## 2018-07-03 DIAGNOSIS — H5201 Hypermetropia, right eye: Secondary | ICD-10-CM | POA: Diagnosis not present

## 2018-07-03 DIAGNOSIS — H26492 Other secondary cataract, left eye: Secondary | ICD-10-CM | POA: Diagnosis not present

## 2018-07-03 DIAGNOSIS — H52201 Unspecified astigmatism, right eye: Secondary | ICD-10-CM | POA: Diagnosis not present

## 2018-07-10 DIAGNOSIS — I10 Essential (primary) hypertension: Secondary | ICD-10-CM | POA: Diagnosis not present

## 2018-07-10 DIAGNOSIS — M859 Disorder of bone density and structure, unspecified: Secondary | ICD-10-CM | POA: Diagnosis not present

## 2018-07-10 DIAGNOSIS — E038 Other specified hypothyroidism: Secondary | ICD-10-CM | POA: Diagnosis not present

## 2018-07-10 DIAGNOSIS — E7849 Other hyperlipidemia: Secondary | ICD-10-CM | POA: Diagnosis not present

## 2018-07-10 DIAGNOSIS — E781 Pure hyperglyceridemia: Secondary | ICD-10-CM | POA: Diagnosis not present

## 2018-07-10 DIAGNOSIS — R82998 Other abnormal findings in urine: Secondary | ICD-10-CM | POA: Diagnosis not present

## 2018-07-17 DIAGNOSIS — K30 Functional dyspepsia: Secondary | ICD-10-CM | POA: Diagnosis not present

## 2018-07-17 DIAGNOSIS — Z0000 Encounter for general adult medical examination without abnormal findings: Secondary | ICD-10-CM | POA: Diagnosis not present

## 2018-07-17 DIAGNOSIS — F1320 Sedative, hypnotic or anxiolytic dependence, uncomplicated: Secondary | ICD-10-CM | POA: Diagnosis not present

## 2018-07-17 DIAGNOSIS — F1120 Opioid dependence, uncomplicated: Secondary | ICD-10-CM | POA: Diagnosis not present

## 2018-07-17 DIAGNOSIS — M549 Dorsalgia, unspecified: Secondary | ICD-10-CM | POA: Diagnosis not present

## 2018-07-17 DIAGNOSIS — R2689 Other abnormalities of gait and mobility: Secondary | ICD-10-CM | POA: Diagnosis not present

## 2018-07-17 DIAGNOSIS — I700 Atherosclerosis of aorta: Secondary | ICD-10-CM | POA: Diagnosis not present

## 2018-07-17 DIAGNOSIS — I350 Nonrheumatic aortic (valve) stenosis: Secondary | ICD-10-CM | POA: Diagnosis not present

## 2018-07-17 DIAGNOSIS — Z23 Encounter for immunization: Secondary | ICD-10-CM | POA: Diagnosis not present

## 2018-07-19 DIAGNOSIS — H26492 Other secondary cataract, left eye: Secondary | ICD-10-CM | POA: Diagnosis not present

## 2018-08-18 ENCOUNTER — Emergency Department (HOSPITAL_COMMUNITY): Payer: Medicare HMO

## 2018-08-18 ENCOUNTER — Observation Stay (HOSPITAL_COMMUNITY)
Admission: EM | Admit: 2018-08-18 | Discharge: 2018-08-19 | Disposition: A | Discharge status: Home / Self Care | Payer: Medicare HMO | Attending: Internal Medicine | Admitting: Internal Medicine

## 2018-08-18 ENCOUNTER — Encounter (HOSPITAL_COMMUNITY): Payer: Self-pay | Admitting: Emergency Medicine

## 2018-08-18 ENCOUNTER — Other Ambulatory Visit: Payer: Self-pay

## 2018-08-18 DIAGNOSIS — E785 Hyperlipidemia, unspecified: Secondary | ICD-10-CM | POA: Diagnosis present

## 2018-08-18 DIAGNOSIS — Z87442 Personal history of urinary calculi: Secondary | ICD-10-CM | POA: Insufficient documentation

## 2018-08-18 DIAGNOSIS — N183 Chronic kidney disease, stage 3 unspecified: Secondary | ICD-10-CM | POA: Diagnosis present

## 2018-08-18 DIAGNOSIS — I2699 Other pulmonary embolism without acute cor pulmonale: Secondary | ICD-10-CM | POA: Diagnosis present

## 2018-08-18 DIAGNOSIS — I10 Essential (primary) hypertension: Secondary | ICD-10-CM | POA: Diagnosis not present

## 2018-08-18 DIAGNOSIS — Z6841 Body Mass Index (BMI) 40.0 and over, adult: Secondary | ICD-10-CM | POA: Insufficient documentation

## 2018-08-18 DIAGNOSIS — Z9049 Acquired absence of other specified parts of digestive tract: Secondary | ICD-10-CM | POA: Insufficient documentation

## 2018-08-18 DIAGNOSIS — E7800 Pure hypercholesterolemia, unspecified: Secondary | ICD-10-CM | POA: Diagnosis not present

## 2018-08-18 DIAGNOSIS — M1990 Unspecified osteoarthritis, unspecified site: Secondary | ICD-10-CM | POA: Insufficient documentation

## 2018-08-18 DIAGNOSIS — I350 Nonrheumatic aortic (valve) stenosis: Secondary | ICD-10-CM | POA: Diagnosis not present

## 2018-08-18 DIAGNOSIS — I2510 Atherosclerotic heart disease of native coronary artery without angina pectoris: Secondary | ICD-10-CM | POA: Diagnosis not present

## 2018-08-18 DIAGNOSIS — K449 Diaphragmatic hernia without obstruction or gangrene: Secondary | ICD-10-CM

## 2018-08-18 DIAGNOSIS — Z86718 Personal history of other venous thrombosis and embolism: Secondary | ICD-10-CM | POA: Insufficient documentation

## 2018-08-18 DIAGNOSIS — K219 Gastro-esophageal reflux disease without esophagitis: Secondary | ICD-10-CM | POA: Diagnosis not present

## 2018-08-18 DIAGNOSIS — I129 Hypertensive chronic kidney disease with stage 1 through stage 4 chronic kidney disease, or unspecified chronic kidney disease: Secondary | ICD-10-CM | POA: Insufficient documentation

## 2018-08-18 DIAGNOSIS — E039 Hypothyroidism, unspecified: Secondary | ICD-10-CM | POA: Diagnosis present

## 2018-08-18 DIAGNOSIS — Z90710 Acquired absence of both cervix and uterus: Secondary | ICD-10-CM | POA: Diagnosis not present

## 2018-08-18 DIAGNOSIS — G8929 Other chronic pain: Secondary | ICD-10-CM | POA: Diagnosis not present

## 2018-08-18 DIAGNOSIS — Z7901 Long term (current) use of anticoagulants: Secondary | ICD-10-CM | POA: Diagnosis not present

## 2018-08-18 DIAGNOSIS — Z833 Family history of diabetes mellitus: Secondary | ICD-10-CM | POA: Insufficient documentation

## 2018-08-18 DIAGNOSIS — Z86711 Personal history of pulmonary embolism: Secondary | ICD-10-CM | POA: Insufficient documentation

## 2018-08-18 DIAGNOSIS — D649 Anemia, unspecified: Secondary | ICD-10-CM | POA: Insufficient documentation

## 2018-08-18 DIAGNOSIS — Z79899 Other long term (current) drug therapy: Secondary | ICD-10-CM | POA: Insufficient documentation

## 2018-08-18 DIAGNOSIS — R079 Chest pain, unspecified: Principal | ICD-10-CM | POA: Diagnosis present

## 2018-08-18 DIAGNOSIS — Z96653 Presence of artificial knee joint, bilateral: Secondary | ICD-10-CM | POA: Insufficient documentation

## 2018-08-18 DIAGNOSIS — Z96611 Presence of right artificial shoulder joint: Secondary | ICD-10-CM | POA: Insufficient documentation

## 2018-08-18 DIAGNOSIS — N182 Chronic kidney disease, stage 2 (mild): Secondary | ICD-10-CM | POA: Diagnosis not present

## 2018-08-18 DIAGNOSIS — R011 Cardiac murmur, unspecified: Secondary | ICD-10-CM | POA: Insufficient documentation

## 2018-08-18 DIAGNOSIS — Z8249 Family history of ischemic heart disease and other diseases of the circulatory system: Secondary | ICD-10-CM | POA: Diagnosis not present

## 2018-08-18 DIAGNOSIS — Z96642 Presence of left artificial hip joint: Secondary | ICD-10-CM | POA: Insufficient documentation

## 2018-08-18 DIAGNOSIS — Z888 Allergy status to other drugs, medicaments and biological substances status: Secondary | ICD-10-CM | POA: Insufficient documentation

## 2018-08-18 HISTORY — DX: Nonrheumatic aortic (valve) stenosis: I35.0

## 2018-08-18 HISTORY — DX: Other pulmonary embolism without acute cor pulmonale: I26.99

## 2018-08-18 LAB — CBC
HEMATOCRIT: 40.6 % (ref 36.0–46.0)
Hemoglobin: 12.8 g/dL (ref 12.0–15.0)
MCH: 28.3 pg (ref 26.0–34.0)
MCHC: 31.5 g/dL (ref 30.0–36.0)
MCV: 89.8 fL (ref 80.0–100.0)
Platelets: 278 10*3/uL (ref 150–400)
RBC: 4.52 MIL/uL (ref 3.87–5.11)
RDW: 12.9 % (ref 11.5–15.5)
WBC: 8.4 10*3/uL (ref 4.0–10.5)
nRBC: 0 % (ref 0.0–0.2)

## 2018-08-18 LAB — BASIC METABOLIC PANEL
Anion gap: 8 (ref 5–15)
BUN: 15 mg/dL (ref 8–23)
CHLORIDE: 99 mmol/L (ref 98–111)
CO2: 31 mmol/L (ref 22–32)
CREATININE: 1 mg/dL (ref 0.44–1.00)
Calcium: 10.4 mg/dL — ABNORMAL HIGH (ref 8.9–10.3)
GFR calc Af Amer: >60 mL/min (ref >60)
GFR calc non Af Amer: 55 mL/min — ABNORMAL LOW (ref >60)
Glucose, Bld: 110 mg/dL — ABNORMAL HIGH (ref 70–99)
Potassium: 3.9 mmol/L (ref 3.5–5.1)
SODIUM: 138 mmol/L (ref 135–145)

## 2018-08-18 LAB — I-STAT TROPONIN, ED: Troponin i, poc: 0.01 ng/mL (ref 0.00–0.08)

## 2018-08-18 MED ORDER — SUCRALFATE 1 G PO TABS
1.0000 g | ORAL_TABLET | Freq: Two times a day (BID) | ORAL | Status: DC
  Administered 2018-08-19: 1 g via ORAL
  Filled 2018-08-18 (×2): qty 1

## 2018-08-18 MED ORDER — ACETAMINOPHEN 650 MG RE SUPP: 650.0000 mg | Freq: Four times a day (QID) | RECTAL | Status: DC | PRN

## 2018-08-18 MED ORDER — DOCUSATE SODIUM 100 MG PO CAPS
100.0000 mg | ORAL_CAPSULE | Freq: Every day | ORAL | Status: DC
  Administered 2018-08-18: 100 mg via ORAL
  Filled 2018-08-18: qty 1

## 2018-08-18 MED ORDER — ONDANSETRON HCL 4 MG/2ML IJ SOLN: 4.0000 mg | Freq: Four times a day (QID) | INTRAMUSCULAR | Status: DC | PRN

## 2018-08-18 MED ORDER — ONDANSETRON HCL 4 MG PO TABS
4.0000 mg | ORAL_TABLET | Freq: Four times a day (QID) | ORAL | Status: DC | PRN
  Administered 2018-08-18: 4 mg via ORAL
  Filled 2018-08-18: qty 1

## 2018-08-18 MED ORDER — ACETAMINOPHEN 325 MG PO TABS
650.0000 mg | ORAL_TABLET | Freq: Four times a day (QID) | ORAL | Status: DC | PRN
  Administered 2018-08-19: 650 mg via ORAL
  Filled 2018-08-18: qty 2

## 2018-08-18 MED ORDER — BACLOFEN 10 MG PO TABS
10.0000 mg | ORAL_TABLET | Freq: Three times a day (TID) | ORAL | Status: DC
  Administered 2018-08-18 – 2018-08-19 (×3): 10 mg via ORAL
  Filled 2018-08-18 (×3): qty 1

## 2018-08-18 MED ORDER — MORPHINE SULFATE (PF) 2 MG/ML IV SOLN: 1.0000 mg | INTRAVENOUS | Status: DC | PRN

## 2018-08-18 MED ORDER — ATENOLOL 25 MG PO TABS
25.0000 mg | ORAL_TABLET | Freq: Every morning | ORAL | Status: DC
  Administered 2018-08-19: 25 mg via ORAL
  Filled 2018-08-18 (×2): qty 1

## 2018-08-18 MED ORDER — ENOXAPARIN SODIUM 40 MG/0.4ML ~~LOC~~ SOLN: 40.0000 mg | SUBCUTANEOUS | Status: DC

## 2018-08-18 MED ORDER — RIVAROXABAN 20 MG PO TABS
20.0000 mg | ORAL_TABLET | Freq: Every evening | ORAL | Status: DC
  Administered 2018-08-18 – 2018-08-19 (×2): 20 mg via ORAL
  Filled 2018-08-18 (×2): qty 1

## 2018-08-18 MED ORDER — ONDANSETRON HCL 4 MG PO TABS: 4.0000 mg | ORAL_TABLET | Freq: Three times a day (TID) | ORAL | Status: DC | PRN

## 2018-08-18 MED ORDER — FAMOTIDINE 20 MG PO TABS
20.0000 mg | ORAL_TABLET | Freq: Every day | ORAL | Status: DC
  Administered 2018-08-18: 20 mg via ORAL
  Filled 2018-08-18: qty 1

## 2018-08-18 MED ORDER — OMEGA-3-ACID ETHYL ESTERS 1 G PO CAPS
1.0000 g | ORAL_CAPSULE | Freq: Every day | ORAL | Status: DC
  Administered 2018-08-19: 1 g via ORAL
  Filled 2018-08-18 (×2): qty 1

## 2018-08-18 MED ORDER — PANTOPRAZOLE SODIUM 40 MG PO TBEC
40.0000 mg | DELAYED_RELEASE_TABLET | Freq: Two times a day (BID) | ORAL | Status: DC
  Administered 2018-08-19: 40 mg via ORAL
  Filled 2018-08-18 (×2): qty 1

## 2018-08-18 MED ORDER — HYDROCODONE-ACETAMINOPHEN 5-325 MG PO TABS
1.0000 | ORAL_TABLET | Freq: Four times a day (QID) | ORAL | Status: DC | PRN
  Administered 2018-08-19 (×2): 1 via ORAL
  Filled 2018-08-18 (×2): qty 1

## 2018-08-18 MED ORDER — ALPRAZOLAM 0.5 MG PO TABS
0.5000 mg | ORAL_TABLET | Freq: Two times a day (BID) | ORAL | Status: DC
  Administered 2018-08-18 – 2018-08-19 (×2): 0.5 mg via ORAL
  Filled 2018-08-18 (×2): qty 1

## 2018-08-18 MED ORDER — LOSARTAN POTASSIUM 25 MG PO TABS
25.0000 mg | ORAL_TABLET | Freq: Every day | ORAL | Status: DC
  Administered 2018-08-18: 25 mg via ORAL
  Filled 2018-08-18 (×2): qty 1

## 2018-08-18 MED ORDER — GABAPENTIN 600 MG PO TABS
600.0000 mg | ORAL_TABLET | Freq: Three times a day (TID) | ORAL | Status: DC
  Administered 2018-08-18 – 2018-08-19 (×3): 600 mg via ORAL
  Filled 2018-08-18 (×3): qty 1

## 2018-08-18 MED ORDER — FUROSEMIDE 20 MG PO TABS
20.0000 mg | ORAL_TABLET | Freq: Two times a day (BID) | ORAL | Status: DC
  Administered 2018-08-19 (×2): 20 mg via ORAL
  Filled 2018-08-18 (×3): qty 1

## 2018-08-18 MED ORDER — LEVOTHYROXINE SODIUM 25 MCG PO TABS: 25.0000 ug | ORAL_TABLET | ORAL | Status: DC

## 2018-08-18 MED ORDER — HYDROCODONE-ACETAMINOPHEN 5-325 MG PO TABS
1.0000 | ORAL_TABLET | Freq: Once | ORAL | Status: AC
  Administered 2018-08-18: 1 via ORAL
  Filled 2018-08-18: qty 1

## 2018-08-18 MED ORDER — SIMVASTATIN 20 MG PO TABS
20.0000 mg | ORAL_TABLET | Freq: Every day | ORAL | Status: DC
  Administered 2018-08-18: 20 mg via ORAL
  Filled 2018-08-18: qty 1

## 2018-08-18 NOTE — H&P (Addendum)
History and Physical    [name] ZOX:[id number] DOB: Dec 29, 1945 DOA: 08/18/2018  PCP: Creola Corn, MD  Patient coming from: Home.  Chief Complaint: Chest pain.  HPI: [name] is a 72 y.o. female with history of hypertension, recurrent pulmonary embolism status post IVC filter placement and on Xarelto, GERD, mild aortic stenosis per 2D echo done in March 2019 started experiencing chest pain burning in sensation after she had a sandwich last evening around 5 PM.  Patient states is a burning in nature sometimes going to the back with no associated shortness of breath nausea vomiting or abdominal pain or any productive cough or fever chills.  Pain has been persistent and worsening.  Has no relation to exertion.  This morning when she tried to eat she also had the pain come back same thing happened in the ER.  Pain is mostly on eating.  ED Course: In the ER EKG shows nonspecific ST-T changes.  Chest x-ray was unremarkable.  Troponin was negative.  Given the persistent pain admitted for further observation.  On exam patient also has a murmur.  Abdomen appears benign.  Review of Systems: As per HPI, rest all negative.   Past Medical History:  Diagnosis Date  . Anemia   . Arthritis    osteoarthritis  . Complication of anesthesia    hard time waking up after her gallbladder surgery 28 years ago  . DVT (deep venous thrombosis) (HCC)    bilateral lungs  . GERD (gastroesophageal reflux disease)    hx of  . Heart murmur   . History of blood transfusion   . History of hiatal hernia    small  . History of kidney stones   . Hypercholesteremia    under control  . Hypertension   . Hypothyroidism   . PONV (postoperative nausea and vomiting)     Past Surgical History:  Procedure Laterality Date  . ABDOMINAL HYSTERECTOMY  1975   partial  . APPENDECTOMY  1973  . BACK SURGERY     X14 1987-2009  . CARDIAC CATHETERIZATION  ~2010  . CARPAL TUNNEL RELEASE Bilateral   .  CHOLECYSTECTOMY  1997  . COLONOSCOPY    . COLONOSCOPY WITH PROPOFOL N/A 12/05/2017   Procedure: COLONOSCOPY WITH PROPOFOL;  Surgeon: Carman Ching, MD;  Location: [facility];  Service: Endoscopy;  Laterality: N/A;  . DILATION AND CURETTAGE OF UTERUS    . ENDARTERECTOMY Right 08/10/2013   Procedure: Excision of Venous Aneurysm Right Neck;  Surgeon: Larina Earthly, MD;  Location: [facility] OR;  Service: Vascular;  Laterality: Right;  . ESOPHAGOGASTRODUODENOSCOPY (EGD) WITH PROPOFOL N/A 12/05/2017   Procedure: ESOPHAGOGASTRODUODENOSCOPY (EGD) WITH PROPOFOL;  Surgeon: Carman Ching, MD;  Location: [facility];  Service: Endoscopy;  Laterality: N/A;  . filter Left 2009   IVC  . HIP SURGERY Left 2008  . JOINT REPLACEMENT Right 11/2003   (knee)multiple surgeries  . JOINT REPLACEMENT Left 12/2009   (knee)  . JOINT REPLACEMENT Left 2011, 2012   Hip  . MASS EXCISION  1974   uterus  . QUADRICEPS TENDON REPAIR Right 12/06/2016   Procedure: QUAD TENDON RECONSTRUCTION;  Surgeon: Durene Romans, MD;  Location: [facility];  Service: Orthopedics;  Laterality: Right;  Requests for 90 mins  . TONSILLECTOMY  1986  . TOTAL HIP REVISION Left 12/07/2013   Procedure:  REVISION  CONSTRAINED LINER LEFT TOTAL HIP ;  Surgeon: Shelda Pal, MD;  Location: [facility];  Service: Orthopedics;  Laterality: Left;  .  TOTAL SHOULDER REPLACEMENT Right 11/30/2010  . TUBAL LIGATION  1973     reports that she has never smoked. She has never used smokeless tobacco. She reports that she does not drink alcohol or use drugs.  Allergies  Allergen Reactions  . Celecoxib Rash    Family History  Problem Relation Age of Onset  . Diabetes Mother   . Heart disease Mother        before age 27  . Hyperlipidemia Mother   . Hypertension Mother   . Diabetes Father   . Hyperlipidemia Father   . Hypertension Father   . Heart attack Father   . Peripheral vascular disease Father   . Other Father        amputation  . Diabetes Sister   .  Hyperlipidemia Sister   . Hypertension Sister   . Deep vein thrombosis Brother   . Hyperlipidemia Brother   . Hypertension Brother     Prior to Admission medications   Medication Sig Start Date End Date Taking? Authorizing Provider  ALPRAZolam Prudy Feeler) 0.5 MG tablet Take 0.5 mg by mouth 2 (two) times daily.  07/17/13   [provider]  Ascorbic Acid (VITAMIN C PO) Take 2 tablets by mouth daily.     [provider]  atenolol (TENORMIN) 25 MG tablet Take 25 mg by mouth every morning.  06/11/13   [provider]  baclofen (LIORESAL) 10 MG tablet Take 1 tablet (10 mg total) by mouth every 8 (eight) hours. Patient taking differently: Take 10 mg by mouth 3 (three) times daily.  12/07/13   Lanney Gins, PA-C  CINNAMON PO Take 1,500 mg by mouth 2 (two) times daily.     [provider]  Collagen-Boron-Hyaluronic Acid ([employer] JOINT HEALTH TRIPLE ACTION) 10-5-3.3 MG TABS Take 1 tablet by mouth daily.     [provider]  Cyanocobalamin (B-12 PO) Take 500 mcg by mouth daily.     [provider]  docusate sodium (COLACE) 100 MG capsule Take 100 mg by mouth daily.     [provider]  famotidine (PEPCID AC) 10 MG chewable tablet Chew 20 mg by mouth at bedtime.    [provider]  furosemide (LASIX) 20 MG tablet Take 40 mg by mouth 2 (two) times daily.  05/28/13   [provider]  gabapentin (NEURONTIN) 600 MG tablet Take 600 mg by mouth 3 (three) times daily.  05/25/13   [provider]  HYDROcodone-acetaminophen (NORCO/VICODIN) 5-325 MG tablet Take 1 tablet by mouth every 6 (six) hours as needed for moderate pain.     [provider]  levothyroxine (SYNTHROID, LEVOTHROID) 25 MCG tablet Take 25 mcg by mouth every Monday, Wednesday, and Friday.    [provider]  losartan (COZAAR) 25 MG tablet Take 25 mg by mouth daily.     [provider]  Omega-3 Fatty Acids (FISH OIL PO) Take 2 capsules by mouth  daily.     [provider]  ondansetron (ZOFRAN) 4 MG tablet Take 4 mg by mouth every 8 (eight) hours as needed for nausea or vomiting.    [provider]  OVER THE COUNTER MEDICATION Take 2 tablets by mouth daily. Multivitamin plus Omega 3 supplement    [provider]  OVER THE COUNTER MEDICATION Apply 1 application topically 3 (three) times daily as needed (joint pain). [place] Dream Cream- Muscle Spasms     [provider]  pantoprazole (PROTONIX) 40 MG tablet Take 40 mg by  mouth daily.  12/30/17   [provider]  Polyethyl Glycol-Propyl Glycol (SYSTANE OP) Place 1-2 drops into both eyes at bedtime as needed (for dry eyes).    [provider]  Probiotic Product (PROBIOTIC PO) Take 1 capsule by mouth daily.    [provider]  Rivaroxaban (XARELTO) 20 MG TABS tablet Take 20 mg by mouth every evening. 08/11/13   Larina Earthly, MD  simvastatin (ZOCOR) 20 MG tablet Take 20 mg by mouth at bedtime.  05/28/13   [provider]  sucralfate (CARAFATE) 1 g tablet Take 1 tablet (1 g total) by mouth 4 (four) times daily -  with meals and at bedtime. Patient taking differently: Take 1 g by mouth 2 (two) times daily.  04/13/16   Linwood Dibbles, MD    Physical Exam: Vitals:   08/18/18 1915 08/18/18 2000 08/18/18 2115 08/18/18 2146  BP: (!) 154/65 (!) 168/77 (!) 146/57 (!) 165/76  Pulse: (!) 49 66 (!) 58 63  Resp:  15 12 17   Temp:    98 F (36.7 C)  TempSrc:    Oral  SpO2: 96% 97% 95% 95%  Weight:    119.8 kg  Height:    5' 3.5" (1.613 m)      Constitutional: Moderately built and nourished. Vitals:   08/18/18 1915 08/18/18 2000 08/18/18 2115 08/18/18 2146  BP: (!) 154/65 (!) 168/77 (!) 146/57 (!) 165/76  Pulse: (!) 49 66 (!) 58 63  Resp:  15 12 17   Temp:    98 F (36.7 C)  TempSrc:    Oral  SpO2: 96% 97% 95% 95%  Weight:    119.8 kg  Height:    5' 3.5" (1.613 m)   Eyes: Anicteric no pallor. ENMT: No discharge from the  ears eyes nose or mouth. Neck: No mass felt.  No neck rigidity.  No JVD appreciated. Respiratory: No rhonchi or crepitations. Cardiovascular: S1-S2 heard no murmurs appreciated. Abdomen: Soft nontender bowel sounds present. Musculoskeletal: No edema.  No joint effusion. Skin: No rash. Neurologic: Alert awake oriented to time place and person.  Moves all extremities. Psychiatric: Appears normal per normal affect.   Labs on Admission: I have personally reviewed following labs and imaging studies  CBC: Recent Labs  Lab 08/18/18 1343  WBC 8.4  HGB 12.8  HCT 40.6  MCV 89.8  PLT 278   Basic Metabolic Panel: Recent Labs  Lab 08/18/18 1343  NA 138  K 3.9  CL 99  CO2 31  GLUCOSE 110*  BUN 15  CREATININE 1.00  CALCIUM 10.4*   GFR: Estimated Creatinine Clearance: 64.3 mL/min (by C-G formula based on SCr of 1 mg/dL). Liver Function Tests: No results for input(s): AST, ALT, ALKPHOS, BILITOT, PROT, ALBUMIN in the last 168 hours. No results for input(s): LIPASE, AMYLASE in the last 168 hours. No results for input(s): AMMONIA in the last 168 hours. Coagulation Profile: No results for input(s): INR, PROTIME in the last 168 hours. Cardiac Enzymes: No results for input(s): CKTOTAL, CKMB, CKMBINDEX, TROPONINI in the last 168 hours. BNP (last 3 results) No results for input(s): PROBNP in the last 8760 hours. HbA1C: No results for input(s): HGBA1C in the last 72 hours. CBG: No results for input(s): GLUCAP in the last 168 hours. Lipid Profile: No results for input(s): CHOL, HDL, LDLCALC, TRIG, CHOLHDL, LDLDIRECT in the last 72 hours. Thyroid Function Tests: No results for input(s): TSH, T4TOTAL, FREET4, T3FREE, THYROIDAB in the last 72 hours. Anemia Panel: No results  for input(s): VITAMINB12, FOLATE, FERRITIN, TIBC, IRON, RETICCTPCT in the last 72 hours. Urine analysis:    Component Value Date/Time   COLORURINE YELLOW 12/03/2013 1147   APPEARANCEUR CLEAR 12/03/2013 1147    LABSPEC 1.008 12/03/2013 1147   PHURINE 5.5 12/03/2013 1147   GLUCOSEU NEGATIVE 12/03/2013 1147   HGBUR TRACE (A) 12/03/2013 1147   BILIRUBINUR NEGATIVE 12/03/2013 1147   KETONESUR NEGATIVE 12/03/2013 1147   PROTEINUR NEGATIVE 12/03/2013 1147   UROBILINOGEN 0.2 12/03/2013 1147   NITRITE NEGATIVE 12/03/2013 1147   LEUKOCYTESUR NEGATIVE 12/03/2013 1147   Sepsis Labs: @LABRCNTIP (procalcitonin:4,lacticidven:4) )No results found for this or any previous visit (from the past 240 hour(s)).   Radiological Exams on Admission: Dg Chest 2 View  Result Date: 08/18/2018 CLINICAL DATA:  Chest pain EXAM: CHEST - 2 VIEW COMPARISON:  12/21/2017 FINDINGS: Minimal opacity at the peripheral left base that is stable from prior and presumably scarring. Chronic interstitial coarsening. Normal heart size and mediastinal contours. Thoracic fusion hardware and reverse glenohumeral arthroplasty on the right. IMPRESSION: Stable from prior.  No evidence of acute disease. Electronically Signed   By: Marnee Spring M.D.   On: 08/18/2018 14:14    EKG: Independently reviewed.  Normal sinus rhythm with nonspecific ST-T changes.  Assessment/Plan Principal Problem:   Chest pain Active Problems:   Chronic kidney disease, stage II (mild)   Hiatal hernia   Hypothyroidism   Hyperlipidemia   Recurrent pulmonary emboli (HCC)   Essential hypertension    1. Chest pain appears atypical and increases on eating with burning sensation -pain has been persistently improved with Tums.  We will cycle cardiac markers.  Patient does have a systolic murmur and 2D echo done in March 2019 showed mild aortic stenosis.  Will get cardiology opinion.  I have increase patient's Protonix to twice daily and also give 1 dose of Carafate now.  Patient has had a EGD and colonoscopy done in February 2019 this year which showed GERD otherwise unremarkable.  LFTs and lipase are pending.  Patient is not hypoxic not tachycardic and less concern for  PE.  Patient has been compliant with her Xarelto. 2. History of recurrent pulmonary embolism status post IVC filter placement on Xarelto.  Patient is not hypoxic does not have any tachycardia or any symptoms to suggest PE and patient has not missed a dose of Xarelto. 3. Hypertension on metoprolol and ARB and also takes Lasix. 4. Hypothyroidism on Synthroid. 5. Hyperlipidemia on statins. 6. Chronic back pain on hydrocodone gabapentin and baclofen. 7. Hiatal hernia see #1. 8. Mild hypercalcemia -follow metabolic panel.   DVT prophylaxis: Xarelto. Code Status: Full code. Family Communication: Patient's husband. Disposition Plan: Home. Consults called: Cardiology. Admission status: Observation   Eduard Clos MD [facility] Pager [phone].  If 7PM-7AM, please contact night-coverage [email or web address] Password [email or web address]  08/18/2018, 10:03 PM

## 2018-08-18 NOTE — ED Provider Notes (Signed)
I saw and evaluated the patient, reviewed the resident's note and I agree with the findings and plan.  EKG: EKG Interpretation  Date/Time:  Friday August 18 2018 13:24:25 EDT Ventricular Rate:  60 PR Interval:  186 QRS Duration: 88 QT Interval:  392 QTC Calculation: 392 R Axis:   -19 Text Interpretation:  Normal sinus rhythm Nonspecific ST abnormality Abnormal ECG no STEMI, nonspecific T wave changes Confirmed by Arby Barrette [phone]) on 08/18/2018 7:54:29 PM Had chest pain that went from side to side across her chest anteriorly.  Reports it was very intense last night and this morning.  Pain onset was greater than 5 hours after eating.  She reports her shoulder on the left is also been hurting in association.  Appropriate.  No respiratory distress at rest.  3\6 systolic ejection murmur.  Lungs are grossly clear.  Abdomen is soft and nontender.  No significant peripheral edema.   Patient with significant cardiac risk factors.  For observation admission.       Arby Barrette, MD 08/18/18 2019

## 2018-08-18 NOTE — ED Triage Notes (Signed)
Pt presents with chest pain. She reports it sometimes moves to her back. She thought it maybe GERD. SOB at times, speaking clear sentences. The patient is alert and oriented x4 with no acute distress at triage.  PA at bedside.

## 2018-08-18 NOTE — ED Provider Notes (Signed)
[facility] EMERGENCY DEPARTMENT Provider Note   CSN: [id number] Arrival date & time: 08/18/18  1312     History   Chief Complaint Chief Complaint  Patient presents with  . Chest Pain    HPI [name] is a 72 y.o. female.  HPI Patient is a 72 year old female with a past medical history of GERD, hypertension, hyperlipidemia, hypothyroidism, aortic stenosis, and anemia who presents to the emergency department for evaluation of chest pain.  Patient reports that she has had intermittent chest pain over the past few weeks.  States that following meals recently she has had a substernal burning that worsens with sitting down or lying flat and is relieved by walking around.  She reports that she was scheduled to see her primary care physician yesterday, however she was unable to do so.  States that yesterday in addition to her substernal burning she had radiation of her pain to her left shoulder and down her left arm.  She had mild shortness of breath at that time and her symptoms resolved after a few minutes.  She did not have any associated diaphoresis, nausea, vomiting, or lightheadedness.  Denies any pleuritic component to her chest pain, history of DVT, or leg swelling.  Remaining review of systems is as detailed below.  Past Medical History:  Diagnosis Date  . Anemia   . Arthritis    osteoarthritis  . Complication of anesthesia    hard time waking up after her gallbladder surgery 28 years ago  . DVT (deep venous thrombosis) (HCC)    bilateral lungs  . GERD (gastroesophageal reflux disease)    hx of  . Heart murmur   . History of blood transfusion   . History of hiatal hernia    small  . History of kidney stones   . Hypercholesteremia    under control  . Hypertension   . Hypothyroidism   . PONV (postoperative nausea and vomiting)     Patient Active Problem List   Diagnosis Date Noted  . Chest pain 08/18/2018  . Essential hypertension 08/18/2018  .  Rupture of quadriceps tendon 12/06/2016  . Chronic kidney disease, stage II (mild) 07/27/2016  . Hiatal hernia 07/27/2016  . Hypothyroidism 07/27/2016  . Hyperlipidemia 07/27/2016  . History of DVT (deep vein thrombosis) 07/27/2016  . Recurrent pulmonary emboli (HCC) 07/27/2016  . Chronic anticoagulation 07/27/2016  . Chronic lower back pain 07/27/2016  . S/P left hip revision 12/07/2013  . Visit for suture removal 09/25/2013  . Aneurysm of unspecified site [facility]) 08/28/2013    Past Surgical History:  Procedure Laterality Date  . ABDOMINAL HYSTERECTOMY  1975   partial  . APPENDECTOMY  1973  . BACK SURGERY     X14 1987-2009  . CARDIAC CATHETERIZATION  ~2010  . CARPAL TUNNEL RELEASE Bilateral   . CHOLECYSTECTOMY  1997  . COLONOSCOPY    . COLONOSCOPY WITH PROPOFOL N/A 12/05/2017   Procedure: COLONOSCOPY WITH PROPOFOL;  Surgeon: Carman Ching, MD;  Location: [facility] ENDOSCOPY;  Service: Endoscopy;  Laterality: N/A;  . DILATION AND CURETTAGE OF UTERUS    . ENDARTERECTOMY Right 08/10/2013   Procedure: Excision of Venous Aneurysm Right Neck;  Surgeon: Larina Earthly, MD;  Location: [facility] OR;  Service: Vascular;  Laterality: Right;  . ESOPHAGOGASTRODUODENOSCOPY (EGD) WITH PROPOFOL N/A 12/05/2017   Procedure: ESOPHAGOGASTRODUODENOSCOPY (EGD) WITH PROPOFOL;  Surgeon: Carman Ching, MD;  Location: [facility] ENDOSCOPY;  Service: Endoscopy;  Laterality: N/A;  . filter Left 2009   IVC  .  HIP SURGERY Left 2008  . JOINT REPLACEMENT Right 11/2003   (knee)multiple surgeries  . JOINT REPLACEMENT Left 12/2009   (knee)  . JOINT REPLACEMENT Left 2011, 2012   Hip  . MASS EXCISION  1974   uterus  . QUADRICEPS TENDON REPAIR Right 12/06/2016   Procedure: QUAD TENDON RECONSTRUCTION;  Surgeon: Durene Romans, MD;  Location: [facility] ORS;  Service: Orthopedics;  Laterality: Right;  Requests for 90 mins  . TONSILLECTOMY  1986  . TOTAL HIP REVISION Left 12/07/2013   Procedure:  REVISION  CONSTRAINED LINER LEFT TOTAL HIP ;   Surgeon: Shelda Pal, MD;  Location: [facility] ORS;  Service: Orthopedics;  Laterality: Left;  . TOTAL SHOULDER REPLACEMENT Right 11/30/2010  . TUBAL LIGATION  1973     OB History   None      Home Medications    Prior to Admission medications   Medication Sig Start Date End Date Taking? Authorizing Provider  ALPRAZolam Prudy Feeler) 0.5 MG tablet Take 0.5 mg by mouth 2 (two) times daily.  07/17/13  Yes [provider]  Ascorbic Acid (VITAMIN C PO) Take 2 tablets by mouth daily.    Yes [provider]  atenolol (TENORMIN) 25 MG tablet Take 25 mg by mouth See admin instructions. Take one tablet (25 mg) by mouth every morning, may also take a 2nd tablet (25 mg) if SBP >150 06/11/13  Yes [provider]  baclofen (LIORESAL) 10 MG tablet Take 1 tablet (10 mg total) by mouth every 8 (eight) hours. Patient taking differently: Take 10 mg by mouth 3 (three) times daily.  12/07/13  Yes Babish, Molli Hazard, PA-C  CINNAMON PO Take 1,500 mg by mouth 2 (two) times daily.    Yes [provider]  Collagen-Boron-Hyaluronic Acid ([employer] TRIPLE ACTION) 10-5-3.3 MG TABS Take 1 tablet by mouth daily.    Yes [provider]  docusate sodium (COLACE) 250 MG capsule Take 250 mg by mouth at bedtime.    Yes [provider]  famotidine (PEPCID) 20 MG tablet Take 40 mg by mouth at bedtime.   Yes [provider]  furosemide (LASIX) 20 MG tablet Take 20 mg by mouth See admin instructions. Take one tablet (20 mg) by mouth - twice daily - morning and 3pm 05/28/13  Yes [provider]  gabapentin (NEURONTIN) 600 MG tablet Take 600 mg by mouth 3 (three) times daily.  05/25/13  Yes [provider]  HYDROcodone-acetaminophen (NORCO/VICODIN) 5-325 MG tablet Take 1 tablet by mouth 3 (three) times daily.    Yes [provider]  levothyroxine (SYNTHROID, LEVOTHROID) 50 MCG tablet Take 25 mcg by mouth every Monday, Wednesday, and Friday. 08/09/18  Yes  [provider]  losartan (COZAAR) 25 MG tablet Take 25 mg by mouth at bedtime.    Yes [provider]  Omega-3 Fatty Acids (FISH OIL PO) Take 2 capsules by mouth daily.    Yes [provider]  ondansetron (ZOFRAN-ODT) 4 MG disintegrating tablet Take 4 mg by mouth 2 (two) times daily as needed. 06/07/18  Yes [provider]  OVER THE COUNTER MEDICATION Take 2 tablets by mouth daily. Multivitamin plus Omega 3 supplement   Yes [provider]  OVER THE COUNTER MEDICATION Apply 1 application topically 3 (three) times daily as needed (joint pain). [place] Dream Cream- Muscle Spasms    Yes [provider]  pantoprazole (PROTONIX) 40 MG tablet Take 40 mg by mouth daily.  12/30/17  Yes [provider]  Polyethyl Glycol-Propyl Glycol (SYSTANE OP) Place 1-2 drops into both eyes at bedtime as needed (for dry eyes).   Yes [provider]  Probiotic Product (PROBIOTIC PO) Take 1 capsule by mouth daily.   Yes [provider]  Rivaroxaban (XARELTO) 20 MG TABS tablet Take 20 mg by mouth daily at 3 pm.  08/11/13  Yes Early, Kristen Loader, MD  simvastatin (ZOCOR) 20 MG tablet Take 20 mg by mouth at bedtime.  05/28/13  Yes [provider]  vitamin B-12 (CYANOCOBALAMIN) 500 MCG tablet Take 500 mcg by mouth daily.   Yes [provider]  sucralfate (CARAFATE) 1 g tablet Take 1 tablet (1 g total) by mouth 4 (four) times daily -  with meals and at bedtime. Patient not taking: Reported on 08/18/2018 04/13/16   Linwood Dibbles, MD    Family History Family History  Problem Relation Age of Onset  . Diabetes Mother   . Heart disease Mother        before age 18  . Hyperlipidemia Mother   . Hypertension Mother   . Diabetes Father   . Hyperlipidemia Father   . Hypertension Father   . Heart attack Father   . Peripheral vascular disease Father   . Other Father        amputation  . Diabetes Sister   . Hyperlipidemia Sister   .  Hypertension Sister   . Deep vein thrombosis Brother   . Hyperlipidemia Brother   . Hypertension Brother     Social History Social History   Tobacco Use  . Smoking status: Never Smoker  . Smokeless tobacco: Never Used  Substance Use Topics  . Alcohol use: No  . Drug use: No     Allergies   Celecoxib   Review of Systems Review of Systems  Constitutional: Negative for chills and fever.  HENT: Negative for ear pain and sore throat.   Eyes: Negative for pain and visual disturbance.  Respiratory: Positive for shortness of breath. Negative for cough.   Cardiovascular: Positive for chest pain. Negative for palpitations.  Gastrointestinal: Negative for abdominal pain and vomiting.  Genitourinary: Negative for dysuria and hematuria.  Musculoskeletal: Positive for arthralgias (left shoulder). Negative for back pain.  Skin: Negative for color change and rash.  Neurological: Negative for seizures, syncope and light-headedness.  Psychiatric/Behavioral: Negative for agitation and confusion.  All other systems reviewed and are negative.    Physical Exam Updated Vital Signs BP (!) 165/76 (BP Location: Right Arm)   Pulse 63   Temp 98 F (36.7 C) (Oral)   Resp 17   Ht 5' 3.5" (1.613 m)   Wt 119.8 kg   SpO2 95%   BMI 46.05 kg/m   Physical Exam  Constitutional: She appears well-developed and well-nourished. No distress.  HENT:  Head: Normocephalic and atraumatic.  Eyes: Conjunctivae are normal.  Neck: Neck supple.  Cardiovascular: Normal rate and regular rhythm.  Murmur heard.  Systolic murmur is present. Pulmonary/Chest: Effort normal and breath sounds normal. No respiratory distress.  Abdominal: Soft. There is no tenderness.  Musculoskeletal: She exhibits no edema.  Neurological: She is alert.  Skin: Skin is warm and dry.  Psychiatric: She has a normal mood and affect.  Nursing note and vitals reviewed.    ED Treatments / Results  Labs (all labs ordered are  listed, but only abnormal results are displayed) Labs Reviewed  BASIC METABOLIC PANEL - Abnormal; Notable for the following components:      Result Value  Glucose, Bld 110 (*)    Calcium 10.4 (*)    GFR calc non Af Amer 55 (*)    All other components within normal limits  CBC  HEPATIC FUNCTION PANEL  TROPONIN I  LIPASE, BLOOD  BASIC METABOLIC PANEL  CBC  TROPONIN I  TROPONIN I  I-STAT TROPONIN, ED    EKG EKG Interpretation  Date/Time:  Friday August 18 2018 13:24:25 EDT Ventricular Rate:  60 PR Interval:  186 QRS Duration: 88 QT Interval:  392 QTC Calculation: 392 R Axis:   -19 Text Interpretation:  Normal sinus rhythm Nonspecific ST abnormality Abnormal ECG no STEMI, nonspecific T wave changes Confirmed by Arby Barrette [phone]) on 08/18/2018 7:54:29 PM   Radiology Dg Chest 2 View  Result Date: 08/18/2018 CLINICAL DATA:  Chest pain EXAM: CHEST - 2 VIEW COMPARISON:  12/21/2017 FINDINGS: Minimal opacity at the peripheral left base that is stable from prior and presumably scarring. Chronic interstitial coarsening. Normal heart size and mediastinal contours. Thoracic fusion hardware and reverse glenohumeral arthroplasty on the right. IMPRESSION: Stable from prior.  No evidence of acute disease. Electronically Signed   By: Marnee Spring M.D.   On: 08/18/2018 14:14    Procedures Procedures (including critical care time)  Medications Ordered in ED Medications  HYDROcodone-acetaminophen (NORCO/VICODIN) 5-325 MG per tablet 1 tablet (has no administration in time range)  atenolol (TENORMIN) tablet 25 mg (has no administration in time range)  furosemide (LASIX) tablet 20 mg (has no administration in time range)  losartan (COZAAR) tablet 25 mg (25 mg Oral Given 08/18/18 2356)  simvastatin (ZOCOR) tablet 20 mg (20 mg Oral Given 08/18/18 2358)  ALPRAZolam (XANAX) tablet 0.5 mg (0.5 mg Oral Given 08/18/18 2353)  levothyroxine (SYNTHROID, LEVOTHROID) tablet 25 mcg (has no  administration in time range)  docusate sodium (COLACE) capsule 100 mg (100 mg Oral Given 08/18/18 2354)  famotidine (PEPCID) tablet 20 mg (20 mg Oral Given 08/18/18 2355)  pantoprazole (PROTONIX) EC tablet 40 mg (has no administration in time range)  sucralfate (CARAFATE) tablet 1 g (1 g Oral Not Given 08/18/18 2358)  rivaroxaban (XARELTO) tablet 20 mg (20 mg Oral Given 08/18/18 2358)  baclofen (LIORESAL) tablet 10 mg (10 mg Oral Given 08/18/18 2354)  gabapentin (NEURONTIN) tablet 600 mg (600 mg Oral Given 08/18/18 2356)  omega-3 acid ethyl esters (LOVAZA) capsule 1 g (has no administration in time range)  acetaminophen (TYLENOL) tablet 650 mg (has no administration in time range)    Or  acetaminophen (TYLENOL) suppository 650 mg (has no administration in time range)  ondansetron (ZOFRAN) tablet 4 mg (4 mg Oral Given 08/18/18 2359)    Or  ondansetron (ZOFRAN) injection 4 mg ( Intravenous See Alternative 08/18/18 2359)  morphine 2 MG/ML injection 1 mg (has no administration in time range)  HYDROcodone-acetaminophen (NORCO/VICODIN) 5-325 MG per tablet 1 tablet (1 tablet Oral Given 08/18/18 2158)     Initial Impression / Assessment and Plan / ED Course  I have reviewed the triage vital signs and the nursing notes.  Pertinent labs & imaging results that were available during my care of the patient were reviewed by me and considered in my medical decision making (see chart for details).     Patient is a 72 year old female with past medical history as detailed above who presents to the emergency department for evaluation of chest pain.  Patient has been having intermittent chest pain over the past month.  It is associated with stress but it is not exertional.  Patient describes symptoms consistent with GERD in regards to most episode of the chest pain, however patient's chest pain yesterday did radiate to her left shoulder and down her left arm and was not relieved with standing up and walking as has  been normal with her other episodes.  She is currently chest pain-free.  Given patient's past medical history and her symptoms multiple laboratory and imaging studies were obtained.  Patient's labs are overall reassuring.  Her EKG does show some new inverted T waves and other nonspecific ST changes.  These are different from patient's previous EKGs.  Patient's chest x-ray does not show any acute findings at this time.  Patient was found to have a heart score of 6 and is considered high risk based off of this assessment.  As result patient will be admitted to the hospitalist service for further inpatient cardiac work-up to rule out ischemic events causing patient's episodes of chest pains.  For further information regarding patient's continued inpatient stay please see admitting team documentation.  Patient with no further acute events while under my care.  The care of this patient was discussed with my attending physician Dr. Donnald Garre, who voices agreement with work-up and ED disposition.  Final Clinical Impressions(s) / ED Diagnoses   Final diagnoses:  Chest pain, unspecified type    ED Discharge Orders    None       Khristian Phillippi, Winfield Rast, MD 08/19/18 Lindell Noe, MD 08/24/18 [phone]

## 2018-08-19 ENCOUNTER — Observation Stay (HOSPITAL_COMMUNITY): Payer: Medicare HMO

## 2018-08-19 ENCOUNTER — Encounter (HOSPITAL_COMMUNITY): Payer: Self-pay | Admitting: Physician Assistant

## 2018-08-19 DIAGNOSIS — K449 Diaphragmatic hernia without obstruction or gangrene: Secondary | ICD-10-CM | POA: Diagnosis not present

## 2018-08-19 DIAGNOSIS — R072 Precordial pain: Secondary | ICD-10-CM | POA: Diagnosis not present

## 2018-08-19 DIAGNOSIS — Z86718 Personal history of other venous thrombosis and embolism: Secondary | ICD-10-CM | POA: Diagnosis not present

## 2018-08-19 DIAGNOSIS — Z79899 Other long term (current) drug therapy: Secondary | ICD-10-CM | POA: Diagnosis not present

## 2018-08-19 DIAGNOSIS — I2699 Other pulmonary embolism without acute cor pulmonale: Secondary | ICD-10-CM

## 2018-08-19 DIAGNOSIS — I350 Nonrheumatic aortic (valve) stenosis: Secondary | ICD-10-CM | POA: Diagnosis not present

## 2018-08-19 DIAGNOSIS — E7800 Pure hypercholesterolemia, unspecified: Secondary | ICD-10-CM

## 2018-08-19 DIAGNOSIS — Z7901 Long term (current) use of anticoagulants: Secondary | ICD-10-CM | POA: Diagnosis not present

## 2018-08-19 DIAGNOSIS — Z86711 Personal history of pulmonary embolism: Secondary | ICD-10-CM | POA: Diagnosis not present

## 2018-08-19 DIAGNOSIS — I129 Hypertensive chronic kidney disease with stage 1 through stage 4 chronic kidney disease, or unspecified chronic kidney disease: Secondary | ICD-10-CM | POA: Diagnosis not present

## 2018-08-19 DIAGNOSIS — R0789 Other chest pain: Secondary | ICD-10-CM

## 2018-08-19 DIAGNOSIS — E039 Hypothyroidism, unspecified: Secondary | ICD-10-CM | POA: Diagnosis not present

## 2018-08-19 DIAGNOSIS — N182 Chronic kidney disease, stage 2 (mild): Secondary | ICD-10-CM | POA: Diagnosis not present

## 2018-08-19 DIAGNOSIS — R079 Chest pain, unspecified: Secondary | ICD-10-CM | POA: Diagnosis not present

## 2018-08-19 DIAGNOSIS — I10 Essential (primary) hypertension: Secondary | ICD-10-CM | POA: Diagnosis not present

## 2018-08-19 LAB — CBC
HCT: 38 % (ref 36.0–46.0)
Hemoglobin: 11.6 g/dL — ABNORMAL LOW (ref 12.0–15.0)
MCH: 27.3 pg (ref 26.0–34.0)
MCHC: 30.5 g/dL (ref 30.0–36.0)
MCV: 89.4 fL (ref 80.0–100.0)
PLATELETS: 263 10*3/uL (ref 150–400)
RBC: 4.25 MIL/uL (ref 3.87–5.11)
RDW: 13.1 % (ref 11.5–15.5)
WBC: 7 10*3/uL (ref 4.0–10.5)
nRBC: 0 % (ref 0.0–0.2)

## 2018-08-19 LAB — HEPATIC FUNCTION PANEL
ALT: 14 U/L (ref 0–44)
AST: 16 U/L (ref 15–41)
Albumin: 3.9 g/dL (ref 3.5–5.0)
Alkaline Phosphatase: 50 U/L (ref 38–126)
BILIRUBIN DIRECT: 0.1 mg/dL (ref 0.0–0.2)
BILIRUBIN TOTAL: 0.7 mg/dL (ref 0.3–1.2)
Indirect Bilirubin: 0.6 mg/dL (ref 0.3–0.9)
Total Protein: 6.9 g/dL (ref 6.5–8.1)

## 2018-08-19 LAB — TROPONIN I
Troponin I: <0.03 ng/mL (ref <0.03)
Troponin I: <0.03 ng/mL (ref <0.03)

## 2018-08-19 LAB — BASIC METABOLIC PANEL
Anion gap: 6 (ref 5–15)
BUN: 14 mg/dL (ref 8–23)
CALCIUM: 9.7 mg/dL (ref 8.9–10.3)
CO2: 28 mmol/L (ref 22–32)
CREATININE: 0.92 mg/dL (ref 0.44–1.00)
Chloride: 105 mmol/L (ref 98–111)
GFR calc Af Amer: >60 mL/min (ref >60)
GFR calc non Af Amer: >60 mL/min (ref >60)
GLUCOSE: 88 mg/dL (ref 70–99)
Potassium: 3.9 mmol/L (ref 3.5–5.1)
Sodium: 139 mmol/L (ref 135–145)

## 2018-08-19 LAB — LIPASE, BLOOD: Lipase: 23 U/L (ref 11–51)

## 2018-08-19 MED ORDER — FAMOTIDINE 10 MG PO CHEW: 40.0000 mg | CHEWABLE_TABLET | Freq: Every day | ORAL | 0 refills | Status: DC

## 2018-08-19 MED ORDER — NITROGLYCERIN 0.4 MG SL SUBL
0.4000 mg | SUBLINGUAL_TABLET | SUBLINGUAL | Status: DC | PRN
  Administered 2018-08-19: 0.8 mg via SUBLINGUAL

## 2018-08-19 MED ORDER — IOPAMIDOL (ISOVUE-370) INJECTION 76%
INTRAVENOUS | Status: AC
  Administered 2018-08-19: 80 mL
  Filled 2018-08-19: qty 100

## 2018-08-19 MED ORDER — PANTOPRAZOLE SODIUM 40 MG PO TBEC: 40.0000 mg | DELAYED_RELEASE_TABLET | Freq: Two times a day (BID) | ORAL | 0 refills | Status: DC

## 2018-08-19 MED ORDER — NITROGLYCERIN 0.4 MG SL SUBL
SUBLINGUAL_TABLET | SUBLINGUAL | Status: AC
  Administered 2018-08-19: 0.8 mg via SUBLINGUAL
  Filled 2018-08-19: qty 2

## 2018-08-19 MED ORDER — SUCRALFATE 1 G PO TABS: 1.0000 g | ORAL_TABLET | Freq: Two times a day (BID) | ORAL | 0 refills | Status: DC

## 2018-08-19 NOTE — Discharge Summary (Signed)
Physician Discharge Summary  [name]:[id number] DOB: 09-Jan-1946 DOA: 08/18/2018  PCP: Creola Corn, MD  Admit date: 08/18/2018 Discharge date: 08/19/2018  Admitted From: home Disposition: home  Recommendations for Outpatient Follow-up:  1. Follow up with PCP in 1-2 weeks 2. Cardiology as recomended   Home Health:  no Equipment/Devices: none  Discharge Condition: stable CODE STATUS: full Diet recommendation: Heart Healthy  Brief/Interim Summary: [name] is a 72 y.o. female with history of hypertension, recurrent pulmonary embolism status post IVC filter placement and on Xarelto, GERD, mild aortic stenosis per 2D echo done in March 2019 started experiencing chest pain burning in sensation after she had a sandwich last evening around 5 PM.  Patient states is a burning in nature sometimes going to the back with no associated shortness of breath nausea vomiting or abdominal pain or any productive cough or fever chills.  Pain has been persistent and worsening.  Has no relation to exertion.  This morning when she tried to eat she also had the pain come back same thing happened in the ER.  Pain is mostly on eating. She reports recently enjoying fried squash and Pizza which have in the past caused severe heartburn for her.  She also very much enjoys squeezing a while lemon into her iced tea which she enjoys several times daily.  Cardiology evaluated pt and cardiac CT done. Cardiac CT result only showed mild CAD, no obstruction. FFR sent but does not need to wait for result. Overread free of significant ancillary findings. Further management of suspect GI related pain per primary team.  Patient has reached maximal benefit of hospitalization.  Discharge diagnosis, prognosis, plans, follow-up, medications and treatments discussed with the patient(or responsible party) and is in agreement with the plans as described.  Patient is stable for discharge.  Discharge Diagnoses:  Principal  Problem:   Chest pain Active Problems:   Chronic kidney disease, stage II (mild)   Hiatal hernia   Hypothyroidism   Hyperlipidemia   Recurrent pulmonary emboli [facility])   Essential hypertension    Discharge Instructions  Discharge Instructions    Diet - low sodium heart healthy   Complete by:  As directed    Increase activity slowly   Complete by:  As directed      Allergies as of 08/19/2018      Reactions   Celecoxib Rash      Medication List    TAKE these medications   ALPRAZolam 0.5 MG tablet Commonly known as:  XANAX Take 0.5 mg by mouth 2 (two) times daily.   atenolol 25 MG tablet Commonly known as:  TENORMIN Take 25 mg by mouth See admin instructions. Take one tablet (25 mg) by mouth every morning, may also take a 2nd tablet (25 mg) if SBP >150   baclofen 10 MG tablet Commonly known as:  LIORESAL Take 1 tablet (10 mg total) by mouth every 8 (eight) hours. What changed:  when to take this   CINNAMON PO Take 1,500 mg by mouth 2 (two) times daily.   [employer] [facility] TRIPLE ACTION 10-5-3.3 MG Tabs Generic drug:  Collagen-Boron-Hyaluronic Acid Take 1 tablet by mouth daily.   docusate sodium 250 MG capsule Commonly known as:  COLACE Take 250 mg by mouth at bedtime.   famotidine 20 MG tablet Commonly known as:  PEPCID Take 40 mg by mouth at bedtime. What changed:  Another medication with the same name was changed. Make sure you understand how and when to  take each.   famotidine 10 MG chewable tablet Commonly known as:  PEPCID AC Chew 4 tablets (40 mg total) by mouth daily with breakfast. What changed:    how much to take  when to take this   FISH OIL PO Take 2 capsules by mouth daily.   furosemide 20 MG tablet Commonly known as:  LASIX Take 20 mg by mouth See admin instructions. Take one tablet (20 mg) by mouth - twice daily - morning and 3pm   gabapentin 600 MG tablet Commonly known as:  NEURONTIN Take 600 mg by mouth 3 (three) times daily.    HYDROcodone-acetaminophen 5-325 MG tablet Commonly known as:  NORCO/VICODIN Take 1 tablet by mouth 3 (three) times daily.   levothyroxine 50 MCG tablet Commonly known as:  SYNTHROID, LEVOTHROID Take 25 mcg by mouth every Monday, Wednesday, and Friday.   losartan 25 MG tablet Commonly known as:  COZAAR Take 25 mg by mouth at bedtime.   ondansetron 4 MG disintegrating tablet Commonly known as:  ZOFRAN-ODT Take 4 mg by mouth 2 (two) times daily as needed.   OVER THE COUNTER MEDICATION Take 2 tablets by mouth daily. Multivitamin plus Omega 3 supplement   OVER THE COUNTER MEDICATION Apply 1 application topically 3 (three) times daily as needed (joint pain). [place] Dream Cream- Muscle Spasms   pantoprazole 40 MG tablet Commonly known as:  PROTONIX Take 1 tablet (40 mg total) by mouth 2 (two) times daily. What changed:  when to take this   PROBIOTIC PO Take 1 capsule by mouth daily.   rivaroxaban 20 MG Tabs tablet Commonly known as:  XARELTO Take 20 mg by mouth daily at 3 pm.   simvastatin 20 MG tablet Commonly known as:  ZOCOR Take 20 mg by mouth at bedtime.   sucralfate 1 g tablet Commonly known as:  CARAFATE Take 1 tablet (1 g total) by mouth 2 (two) times daily. What changed:  when to take this   SYSTANE OP Place 1-2 drops into both eyes at bedtime as needed (for dry eyes).   vitamin B-12 500 MCG tablet Commonly known as:  CYANOCOBALAMIN Take 500 mcg by mouth daily.   VITAMIN C PO Take 2 tablets by mouth daily.      Follow-up Information    Croitoru, Mihai, MD Follow up.   Specialty:  Cardiology Why:  The office will call you for a follow-up appointment with cardiology to see Dr. Royann Shivers or one of the PAs/nurse practitioners on his care team. Please call the office if you have not heard from [place] within 3 days. Contact information: [address] [place] [address] [phone]        Creola Corn, MD Follow up in 1 week(s).    Specialty:  Internal Medicine Why:  need appointment for possible referral to GI doctor given degree of symptoms Contact information: [address] [place] [address] [phone]        Lyn Records, MD .   Specialty:  Cardiology Contact information: [address] [place] [address] [phone]          Allergies  Allergen Reactions  . Celecoxib Rash    Consultations:  Cardiology [facility]   Procedures/Studies: Dg Chest 2 View  Result Date: 08/18/2018 CLINICAL DATA:  Chest pain EXAM: CHEST - 2 VIEW COMPARISON:  12/21/2017 FINDINGS: Minimal opacity at the peripheral left base that is stable from prior and presumably scarring. Chronic interstitial coarsening. Normal heart size and  mediastinal contours. Thoracic fusion hardware and reverse glenohumeral arthroplasty on the right. IMPRESSION: Stable from prior.  No evidence of acute disease. Electronically Signed   By: Marnee Spring M.D.   On: 08/18/2018 14:14   Ct Coronary Morph W/cta Cor W/score W/ca W/cm &/or Wo/cm  Addendum Date: 08/19/2018   ADDENDUM REPORT: 08/19/2018 14:52 EXAM: OVER-READ INTERPRETATION  CT CHEST The following report is an over-read performed by radiologist Dr. Maryelizabeth Rowan [facility] on 08/19/2018. This over-read does not include interpretation of cardiac or coronary anatomy or pathology. The coronary CTA interpretation by the cardiologist is attached. COMPARISON:  None. FINDINGS: Limited view of the lung parenchyma demonstrates no suspicious nodularity. Airways are normal. Limited view of the mediastinum demonstrates no adenopathy. Esophagus normal. Limited view of the upper abdomen unremarkable. Limited view of the skeleton and chest wall is unremarkable. IMPRESSION: No significant extracardiac findings. Electronically Signed   By: Genevive Bi M.D.   On: 08/19/2018 14:52   Result Date: 08/19/2018 HISTORY: Chest pain, ECG changes EXAM: Cardiac/Coronary   CT TECHNIQUE: The patient was scanned on a [employer]. PROTOCOL: A 120 kV prospective scan was triggered in the descending thoracic aorta at 111 HU's. Axial non-contrast 3 mm slices were carried out through the heart. The data set was analyzed on a dedicated work station and scored using the Agatson method. Gantry rotation speed was 250 msecs and collimation was .6 mm. No beta blockade and 0.8 mg of sl NTG was given. The 3D data set was reconstructed in 5% intervals of the 67-82 % of the R-R cycle. Diastolic phases were analyzed on a dedicated work station using MPR, MIP and VRT modes. The patient received 80 cc of contrast. FINDINGS: Coronary calcium score: The patient's coronary artery calcium score is 135, which places the patient in the 72 percentile. This is greater than expected for age and gender matched peers. Coronary arteries: Normal coronary origins.  Right dominance. Right Coronary Artery: Minimal mixed atherosclerotic plaque in the mid RCA, mild mixed atherosclerotic plaque in the distal RCA (25-49% stenosis). Patent PDA with mild atherosclerotic disease. Patent PL branch., medium caliber. Left Main Coronary Artery: No detectable plaque or stenosis. Left Anterior Descending Coronary Artery: Mild mixed atherosclerotic plaque in the proximal LAD (25-49% stenosis). Small caliber patent diagonal branches. There is a large, fourth diagonal vs. Dual LAD that is widely patent. Left Circumflex Artery: Nondominant. No detectable plaque or stenosis. OM1 is patent, medium caliber. Aorta: Normal size. Mild calcifications in the descending thoracic aorta. No dissection. Aortic Valve: Mild leaflet and annular calcifications. Cannot comment on morphology due to phase of cardiac cycle captured. Other findings: Normal pulmonary vein drainage into the left atrium. Normal let atrial appendage without a thrombus. Normal size of the pulmonary artery. Trivial mitral annular calcification. IMPRESSION: 1. The  patient's coronary artery calcium score is 135, which places the patient in the 72 percentile. This is greater than expected for age and gender matched peers. 2. Normal coronary origin with right dominance. 3. Mild CAD, CADRADS = 2. Mild stenosis of the Proximal LAD and the distal RCA. Electronically Signed: By: Weston Brass On: 08/19/2018 13:14    Cardiac Ct as described   Subjective:  No new complaints  Discharge Exam: Vitals:   08/19/18 1121 08/19/18 1230  BP: (!) 149/67 139/60  Pulse: (!) 58 (!) 51  Resp:    Temp:  98.1 F (36.7 C)  SpO2:  96%   Vitals:   08/19/18 0541 08/19/18 1020  08/19/18 1121 08/19/18 1230  BP: (!) 120/49 139/64 (!) 149/67 139/60  Pulse: (!) 56 62 (!) 58 (!) 51  Resp: 16     Temp: 98.2 F (36.8 C)   98.1 F (36.7 C)  TempSrc: Oral   Oral  SpO2: 93%   96%  Weight:      Height:        General: Pt is alert, awake, not in acute distress Cardiovascular: RRR, S1/S2 +, no rubs, no gallops Respiratory: CTA bilaterally, no wheezing, no rhonchi Abdominal: Soft, NT, ND, bowel sounds + Extremities: no edema, no cyanosis    The results of significant diagnostics from this hospitalization (including imaging, microbiology, ancillary and laboratory) are listed below for reference.     Microbiology: No results found for this or any previous visit (from the past 240 hour(s)).   Labs: BNP (last 3 results) No results for input(s): BNP in the last 8760 hours. Basic Metabolic Panel: Recent Labs  Lab 08/18/18 1343 08/19/18 0507  NA 138 139  K 3.9 3.9  CL 99 105  CO2 31 28  GLUCOSE 110* 88  BUN 15 14  CREATININE 1.00 0.92  CALCIUM 10.4* 9.7   Liver Function Tests: Recent Labs  Lab 08/18/18 2250  AST 16  ALT 14  ALKPHOS 50  BILITOT 0.7  PROT 6.9  ALBUMIN 3.9   Recent Labs  Lab 08/18/18 2250  LIPASE 23   No results for input(s): AMMONIA in the last 168 hours. CBC: Recent Labs  Lab 08/18/18 1343 08/19/18 0507  WBC 8.4 7.0  HGB  12.8 11.6*  HCT 40.6 38.0  MCV 89.8 89.4  PLT 278 263   Cardiac Enzymes: Recent Labs  Lab 08/18/18 2250 08/19/18 0507 08/19/18 0926  TROPONINI <0.03 <0.03 <0.03   BNP: Invalid input(s): POCBNP CBG: No results for input(s): GLUCAP in the last 168 hours. D-Dimer No results for input(s): DDIMER in the last 72 hours. Hgb A1c No results for input(s): HGBA1C in the last 72 hours. Lipid Profile No results for input(s): CHOL, HDL, LDLCALC, TRIG, CHOLHDL, LDLDIRECT in the last 72 hours. Thyroid function studies No results for input(s): TSH, T4TOTAL, T3FREE, THYROIDAB in the last 72 hours.  Invalid input(s): FREET3 Anemia work up No results for input(s): VITAMINB12, FOLATE, FERRITIN, TIBC, IRON, RETICCTPCT in the last 72 hours. Urinalysis    Component Value Date/Time   COLORURINE YELLOW 12/03/2013 1147   APPEARANCEUR CLEAR 12/03/2013 1147   LABSPEC 1.008 12/03/2013 1147   PHURINE 5.5 12/03/2013 1147   GLUCOSEU NEGATIVE 12/03/2013 1147   HGBUR TRACE (A) 12/03/2013 1147   BILIRUBINUR NEGATIVE 12/03/2013 1147   KETONESUR NEGATIVE 12/03/2013 1147   PROTEINUR NEGATIVE 12/03/2013 1147   UROBILINOGEN 0.2 12/03/2013 1147   NITRITE NEGATIVE 12/03/2013 1147   LEUKOCYTESUR NEGATIVE 12/03/2013 1147   Sepsis Labs Invalid input(s): PROCALCITONIN,  WBC,  LACTICIDVEN Microbiology No results found for this or any previous visit (from the past 240 hour(s)).   Time coordinating discharge: 42 minutes  SIGNED:   Lahoma Crocker, MD  FACP [facility] 08/19/2018, 4:05 PM Pager   If 7PM-7AM, please contact night-coverage [email or web address] Password [email or web address]

## 2018-08-19 NOTE — Consult Note (Addendum)
Cardiology Consultation:   Patient ID: [name]; [id number]; 1946-08-27   Admit date: 08/18/2018 Date of Consult: 08/19/2018  Primary Care Provider: Creola Corn, MD Primary Cardiologist: Lesleigh Noe, MD Primary Electrophysiologist:  None  Chief Complaint: chest pain  Patient Profile:   [name] is a 72 y.o. female with a hx of mild AS by echo 12/2017, HTN, HLD, PE after disc surgery 2008 (on lifelong anticoagulation per pt), morbid obesity who is being seen today for the evaluation of chest pain at the request of Dr. Toniann Fail.  History of Present Illness:   [name] underwent remote cath 2009 which reportedly showed normal coronaries. She was evaluated by Dr. Katrinka Blazing in 07/2016 for chest pain. She had a nuclear stress test showing a large defect of moderate severity present in the basal inferior, mid inferior, mid inferolateral, apical inferior, apical lateral and apex location, suspected artifact/chest wall attenuation based on report, EF 63% - Dr. Royann Shivers reviewed images and feels this may represent an old fixed infarct. 2D echo 12/2017 showed EF 55-60%, AV difficult to see but mild AS based on gradients. She has been followed by GI for GERD and states she traditionally has to be very careful about what she eats as certain foods exacerbate indigestion. It's happened about once a week for a while now. She was previously on carafate but this interfered with absorption of Lasix, but does acknowledge that it helped.  On Thursday evening she ate a deli meat sandwich which she typically prefers to avoid. About 3 hours afterwards (9pm) she developed substernal chest burning across her chest into shoulders and back. She has chronic pain in her back and her shoulders anyway due to many longstanding ortho issues so she had a hard time discerning this from chronic discomfort. Her CP improved with Tums, walking around and burping which typically eases her discomfort. However, it  subsequently recurred shortly after and persisted until around 2:30AM when she was finally able to rest. She woke up around 4:15am, not for any particular reason - she's not really sure if she was having pain at that time.. It did recur yesterday AM. She called GI and PCP and was advised to go to PCP as pain at its worst was 12/10. She was given Pepcid, Zofran and Vicodin in addition to home meds in the ER and pain gradually eased off. It is not worse with expertion, inspiration or palpation. She does not have the chest pain anymore but feels a vague pressure. Troponins are negative, VSS, not tachycardic, tachypneic or hypoxic. BP was elevated at 170/65 on arrival, improved to 120/49. Labs otherwise largely unremarkable. CXR stable from prior. She reports chronic lower extremity edema, L>R, longstanding for years.  She has been compliant with Xarelto and got last dose last night. No n/v, dyspnea, palpitations or syncope.  Father had massive MI during flu hospitalization in his 12s. Mother died with some sort of complication from "thick blood"/?polycythemia, unclear if any formal cardiac dx. She is a nonsmoker and reports historically good control of BP and lipids.  Past Medical History:  Diagnosis Date  . Anemia   . Arthritis    osteoarthritis  . Complication of anesthesia    hard time waking up after her gallbladder surgery 28 years ago  . GERD (gastroesophageal reflux disease)    hx of  . Heart murmur   . History of blood transfusion   . History of hiatal hernia    small  .  History of kidney stones   . Hypercholesteremia    under control  . Hypertension   . Hypothyroidism   . Mild aortic stenosis    a. by echo 12/2017.  Marland Kitchen PONV (postoperative nausea and vomiting)   . Pulmonary embolism (HCC)    bilateral lungs - 2008 after disc surgery    Past Surgical History:  Procedure Laterality Date  . ABDOMINAL HYSTERECTOMY  1975   partial  . APPENDECTOMY  1973  . BACK SURGERY     X14  1987-2009  . CARDIAC CATHETERIZATION  ~2010  . CARPAL TUNNEL RELEASE Bilateral   . CHOLECYSTECTOMY  1997  . COLONOSCOPY    . COLONOSCOPY WITH PROPOFOL N/A 12/05/2017   Procedure: COLONOSCOPY WITH PROPOFOL;  Surgeon: Carman Ching, MD;  Location: [facility];  Service: Endoscopy;  Laterality: N/A;  . DILATION AND CURETTAGE OF UTERUS    . ENDARTERECTOMY Right 08/10/2013   Procedure: Excision of Venous Aneurysm Right Neck;  Surgeon: Larina Earthly, MD;  Location: [facility] OR;  Service: Vascular;  Laterality: Right;  . ESOPHAGOGASTRODUODENOSCOPY (EGD) WITH PROPOFOL N/A 12/05/2017   Procedure: ESOPHAGOGASTRODUODENOSCOPY (EGD) WITH PROPOFOL;  Surgeon: Carman Ching, MD;  Location: [facility];  Service: Endoscopy;  Laterality: N/A;  . filter Left 2009   IVC  . HIP SURGERY Left 2008  . JOINT REPLACEMENT Right 11/2003   (knee)multiple surgeries  . JOINT REPLACEMENT Left 12/2009   (knee)  . JOINT REPLACEMENT Left 2011, 2012   Hip  . MASS EXCISION  1974   uterus  . QUADRICEPS TENDON REPAIR Right 12/06/2016   Procedure: QUAD TENDON RECONSTRUCTION;  Surgeon: Durene Romans, MD;  Location: [facility];  Service: Orthopedics;  Laterality: Right;  Requests for 90 mins  . TONSILLECTOMY  1986  . TOTAL HIP REVISION Left 12/07/2013   Procedure:  REVISION  CONSTRAINED LINER LEFT TOTAL HIP ;  Surgeon: Shelda Pal, MD;  Location: [facility];  Service: Orthopedics;  Laterality: Left;  . TOTAL SHOULDER REPLACEMENT Right 11/30/2010  . TUBAL LIGATION  1973     Inpatient Medications: Scheduled Meds: . ALPRAZolam  0.5 mg Oral BID  . atenolol  25 mg Oral q morning - 10a  . baclofen  10 mg Oral TID  . docusate sodium  100 mg Oral QHS  . famotidine  20 mg Oral QHS  . furosemide  20 mg Oral BID  . gabapentin  600 mg Oral TID  . [START ON 08/21/2018] levothyroxine  25 mcg Oral Q M,W,F  . losartan  25 mg Oral Daily  . omega-3 acid ethyl esters  1 g Oral Daily  . pantoprazole  40 mg Oral BID  . rivaroxaban  20 mg Oral QPM    . simvastatin  20 mg Oral QHS  . sucralfate  1 g Oral BID   Continuous Infusions:  PRN Meds: acetaminophen **OR** acetaminophen, HYDROcodone-acetaminophen, morphine injection, ondansetron **OR** ondansetron (ZOFRAN) IV  Home Meds: Prior to Admission medications   Medication Sig Start Date End Date Taking? Authorizing Provider  ALPRAZolam Prudy Feeler) 0.5 MG tablet Take 0.5 mg by mouth 2 (two) times daily.  07/17/13  Yes [provider]  Ascorbic Acid (VITAMIN C PO) Take 2 tablets by mouth daily.    Yes [provider]  atenolol (TENORMIN) 25 MG tablet Take 25 mg by mouth See admin instructions. Take one tablet (25 mg) by mouth every morning, may also take a 2nd tablet (25 mg) if SBP >150 06/11/13  Yes [provider]  baclofen (  LIORESAL) 10 MG tablet Take 1 tablet (10 mg total) by mouth every 8 (eight) hours. Patient taking differently: Take 10 mg by mouth 3 (three) times daily.  12/07/13  Yes Babish, Molli Hazard, PA-C  CINNAMON PO Take 1,500 mg by mouth 2 (two) times daily.    Yes [provider]  Collagen-Boron-Hyaluronic Acid ([employer] JOINT [employer]ALTH TRIPLE ACTION) 10-5-3.3 MG TABS Take 1 tablet by mouth daily.    Yes [provider]  docusate sodium (COLACE) 250 MG capsule Take 250 mg by mouth at bedtime.    Yes [provider]  famotidine (PEPCID) 20 MG tablet Take 40 mg by mouth at bedtime.   Yes [provider]  furosemide (LASIX) 20 MG tablet Take 20 mg by mouth See admin instructions. Take one tablet (20 mg) by mouth - twice daily - morning and 3pm 05/28/13  Yes [provider]  gabapentin (NEURONTIN) 600 MG tablet Take 600 mg by mouth 3 (three) times daily.  05/25/13  Yes [provider]  HYDROcodone-acetaminophen (NORCO/VICODIN) 5-325 MG tablet Take 1 tablet by mouth 3 (three) times daily.    Yes [provider]  levothyroxine (SYNTHROID, LEVOTHROID) 50 MCG tablet Take 25 mcg by mouth every Monday, Wednesday, and  Friday. 08/09/18  Yes [provider]  losartan (COZAAR) 25 MG tablet Take 25 mg by mouth at bedtime.    Yes [provider]  Omega-3 Fatty Acids (FISH OIL PO) Take 2 capsules by mouth daily.    Yes [provider]  ondansetron (ZOFRAN-ODT) 4 MG disintegrating tablet Take 4 mg by mouth 2 (two) times daily as needed. 06/07/18  Yes [provider]  OVER THE COUNTER MEDICATION Take 2 tablets by mouth daily. Multivitamin plus Omega 3 supplement   Yes [provider]  OVER THE COUNTER MEDICATION Apply 1 application topically 3 (three) times daily as needed (joint pain). [place] Dream Cream- Muscle Spasms    Yes [provider]  pantoprazole (PROTONIX) 40 MG tablet Take 40 mg by mouth daily.  12/30/17  Yes [provider]  Polyethyl Glycol-Propyl Glycol (SYSTANE OP) Place 1-2 drops into both eyes at bedtime as needed (for dry eyes).   Yes [provider]  Probiotic Product (PROBIOTIC PO) Take 1 capsule by mouth daily.   Yes [provider]  Rivaroxaban (XARELTO) 20 MG TABS tablet Take 20 mg by mouth daily at 3 pm.  08/11/13  Yes Early, Kristen Loader, MD  simvastatin (ZOCOR) 20 MG tablet Take 20 mg by mouth at bedtime.  05/28/13  Yes [provider]  vitamin B-12 (CYANOCOBALAMIN) 500 MCG tablet Take 500 mcg by mouth daily.   Yes [provider]  sucralfate (CARAFATE) 1 g tablet Take 1 tablet (1 g total) by mouth 4 (four) times daily -  with meals and at bedtime. Patient not taking: Reported on 08/18/2018 04/13/16   Linwood Dibbles, MD    Allergies:    Allergies  Allergen Reactions  . Celecoxib Rash    Social History:   Social History   Socioeconomic History  . Marital status: Married    Spouse name: Not on file  . Number of children: Not on file  . Years of education: Not on file  . Highest education level: Not on file  Occupational History  . Not on file  Social Needs  . Financial resource strain: Not on  file  . Food insecurity:    Worry: Not on file    Inability: Not on file  .  Transportation needs:    Medical: Not on file    Non-medical: Not on file  Tobacco Use  . Smoking status: Never Smoker  . Smokeless tobacco: Never Used  Substance and Sexual Activity  . Alcohol use: No  . Drug use: No  . Sexual activity: Not Currently  Lifestyle  . Physical activity:    Days per week: Not on file    Minutes per session: Not on file  . Stress: Not on file  Relationships  . Social connections:    Talks on phone: Not on file    Gets together: Not on file    Attends religious service: Not on file    Active member of club or organization: Not on file    Attends meetings of clubs or organizations: Not on file    Relationship status: Not on file  . Intimate partner violence:    Fear of current or ex partner: Not on file    Emotionally abused: Not on file    Physically abused: Not on file    Forced sexual activity: Not on file  Other Topics Concern  . Not on file  Social History Narrative  . Not on file    Family History:   The patient's family history includes Deep vein thrombosis in her brother; Diabetes in her father, mother, and sister; Heart attack in her father; Heart disease in her mother; Hyperlipidemia in her brother, father, mother, and sister; Hypertension in her brother, father, mother, and sister; Other in her father; Peripheral vascular disease in her father.  ROS:  Please see the history of present illness.  All other ROS reviewed and negative.     Physical Exam/Data:   Vitals:   08/18/18 2000 08/18/18 2115 08/18/18 2146 08/19/18 0541  BP: (!) 168/77 (!) 146/57 (!) 165/76 (!) 120/49  Pulse: 66 (!) 58 63 (!) 56  Resp: 15 12 17 16   Temp:   98 F (36.7 C) 98.2 F (36.8 C)  TempSrc:   Oral Oral  SpO2: 97% 95% 95% 93%  Weight:   119.8 kg   Height:   5' 3.5" (1.613 m)     Intake/Output Summary (Last 24 hours) at 08/19/2018 0931 Last data filed at 08/18/2018  2200 Gross per 24 hour  Intake 125 ml  Output -  Net 125 ml   Filed Weights   08/18/18 2146  Weight: 119.8 kg   Body mass index is 46.05 kg/m.  General: Well developed, well nourished morbidly obese in no acute distress. Head: Normocephalic, atraumatic, sclera non-icteric, no xanthomas, nares are without discharge.  Neck: Negative for carotid bruits. JVD not elevated. Lungs: Clear bilaterally to auscultation without wheezes, rales, or rhonchi. Breathing is unlabored. Heart: RRR 2/6 SEM RUSB with preserved S2. No rubs or gallops appreciated. Abdomen: Soft, non-tender, non-distended with normoactive bowel sounds. No hepatomegaly. No rebound/guarding. No obvious abdominal masses. Msk:  Strength and tone appear normal for age. Extremities: No clubbing or cyanosis. Mild BLE edema L>R which pt states is chronic.  Distal pedal pulses are 2+ and equal bilaterally. Neuro: Alert and oriented X 3. No facial asymmetry. No focal deficit. Moves all extremities spontaneously. Psych:  Responds to questions appropriately with a normal affect, comfortable appearing, smiling  EKG:  The EKG was personally reviewed - EKG this AM shows new ST elevation in I, avL, V2 and TWI in lead III which is unchanged from yesterday's EKG. Prior tracings do show upsloped ST segments in these leads but more accentuated with  new TWI this admission.  Relevant CV Studies: As above  Laboratory Data:  Chemistry Recent Labs  Lab 08/18/18 1343 08/19/18 0507  NA 138 139  K 3.9 3.9  CL 99 105  CO2 31 28  GLUCOSE 110* 88  BUN 15 14  CREATININE 1.00 0.92  CALCIUM 10.4* 9.7  GFRNONAA 55* >60  GFRAA >60 >60  ANIONGAP 8 6    Recent Labs  Lab 08/18/18 2250  PROT 6.9  ALBUMIN 3.9  AST 16  ALT 14  ALKPHOS 50  BILITOT 0.7   Hematology Recent Labs  Lab 08/18/18 1343 08/19/18 0507  WBC 8.4 7.0  RBC 4.52 4.25  HGB 12.8 11.6*  HCT 40.6 38.0  MCV 89.8 89.4  MCH 28.3 27.3  MCHC 31.5 30.5  RDW 12.9 13.1  PLT  278 263   Cardiac Enzymes Recent Labs  Lab 08/18/18 2250 08/19/18 0507  TROPONINI <0.03 <0.03    Recent Labs  Lab 08/18/18 1344  TROPIPOC 0.01    BNPNo results for input(s): BNP, PROBNP in the last 168 hours.  DDimer No results for input(s): DDIMER in the last 168 hours.  Radiology/Studies:  Dg Chest 2 View  Result Date: 08/18/2018 CLINICAL DATA:  Chest pain EXAM: CHEST - 2 VIEW COMPARISON:  12/21/2017 FINDINGS: Minimal opacity at the peripheral left base that is stable from prior and presumably scarring. Chronic interstitial coarsening. Normal heart size and mediastinal contours. Thoracic fusion hardware and reverse glenohumeral arthroplasty on the right. IMPRESSION: Stable from prior.  No evidence of acute disease. Electronically Signed   By: Marnee Spring M.D.   On: 08/18/2018 14:14    Assessment and Plan:   1. Chest pain with mixed features and abnormal EKG - EKG discussed acutely with Dr. Royann Shivers who does not feel this meets formal STEMI criteria. Symptoms are quite atypical. She had 5 hours of constant 10-12/10 chest pain on Thursday PM but negative enzymes. She certainly has cardiac risk factors and with abnormal EKG, will plan cardiac CT to further evaluate today.   2. GERD - per IM.  3. HTN, currently controlled this AM  4. H/o PE, on Xarelto - last dose was last night. If CT is abnormal requiring further workup will need to hold further doses and consider heparin bridge.  5. Hyperlipidemia - check lipid profile in AM if still here.  6. Mild AS - recently assessed 12/2017. Denies any sx of exertional angina, SOB, or syncope.   For questions or updates, please contact [facility] Please consult [email or web address] for contact info under Cardiology/STEMI.    Signed, Laurann Montana, PA-C  08/19/2018 9:31 AM  I have seen and examined the patient along with Laurann Montana, PA-C.  I have reviewed the chart, notes and new data.  I agree with PA/NP's note.  Key new  complaints: symptoms are highly atypical, associated with meals (2-4 h after larger meal), alleviated by Tums and carafate, but recently unusually severe and prolonged. Currently asymptomatic. She is chronically on anticoag Key examination changes: early peaking systolic murmur of AS, otherwise normal CV exam; obesity Key new findings / data: marked changes on ECG with upwardly concave new ST elevation in I and aVL, possibly reciprocal ST depression in III. Several negative troponin assays. Reviewed nuclear study from 2017 with a fairly dense fixed inferolateral defect described as attenuation artifact. Note "clean" cath in 2009.  PLAN: Confusing clinical scenario and unusual ECG changes. Does not appear to have a true acute coronary event, but  I wonder whether she could have a chronically occluded PLA or OM branch.  I think this is a situation well suited for a coronary CT angio. Will try to get that performed today (if not logistically possible, can schedule for next week as an outpatient). If negative coronary workup, check echo as an outpatient for any pericardial abnormalities.  Thurmon Fair, MD, [facility] [phone] 08/19/2018, 10:11 AM

## 2018-08-19 NOTE — Progress Notes (Signed)
Cardiac CT result only showed mild CAD, no obstruction. FFR sent but does not need to wait for result. Overread free of significant ancillary findings. Further management of suspect GI related pain per primary team. I have sent a message to our office's scheduling team requesting a follow-up appointment, and our office will call the patient with this information. Spoke with patient, also spoke with nurse to relay to primary team to clarify if any further workup planned.  [facility] will sign off.   Medication Recommendations:  No additional cardiac recs at this time other than risk factor modification including BP control, lipids, weight, activity Other recommendations (labs, testing, etc):  Would benefit from lipid profile as OP to ensure strict control, can be done with PCP Follow up as an outpatient:  See above.   Dayna Dunn PA-C

## 2018-08-20 DIAGNOSIS — R079 Chest pain, unspecified: Secondary | ICD-10-CM | POA: Diagnosis not present

## 2018-08-22 ENCOUNTER — Telehealth: Payer: Self-pay | Admitting: Interventional Cardiology

## 2018-08-22 NOTE — Telephone Encounter (Signed)
Spoke with pt and advised her to contact PCP in regards to Pepcid.  Pt agreeable with plan.

## 2018-08-22 NOTE — Telephone Encounter (Signed)
New message  Pt c/o medication issue:  1. Name of Medication:   famotidine (PEPCID AC) 10 MG chewable tablet     2. How are you currently taking this medication (dosage and times per day)? n/a 3. Are you having a reaction (difficulty breathing--STAT)?    4. What is your medication issue? Patient wants to know how to she should take this medication? Please advise.

## 2018-08-28 DIAGNOSIS — G894 Chronic pain syndrome: Secondary | ICD-10-CM | POA: Diagnosis not present

## 2018-08-28 DIAGNOSIS — K219 Gastro-esophageal reflux disease without esophagitis: Secondary | ICD-10-CM | POA: Diagnosis not present

## 2018-08-28 DIAGNOSIS — Z95828 Presence of other vascular implants and grafts: Secondary | ICD-10-CM | POA: Diagnosis not present

## 2018-08-28 DIAGNOSIS — Z7901 Long term (current) use of anticoagulants: Secondary | ICD-10-CM | POA: Diagnosis not present

## 2018-08-28 DIAGNOSIS — I517 Cardiomegaly: Secondary | ICD-10-CM | POA: Diagnosis not present

## 2018-09-01 ENCOUNTER — Ambulatory Visit: Payer: Medicare HMO | Admitting: Physician Assistant

## 2018-09-01 ENCOUNTER — Encounter: Payer: Self-pay | Admitting: Physician Assistant

## 2018-09-01 VITALS — BP 158/75 | HR 56 | Resp 16 | Ht 63.5 in | Wt 262.2 lb

## 2018-09-01 DIAGNOSIS — E039 Hypothyroidism, unspecified: Secondary | ICD-10-CM | POA: Diagnosis not present

## 2018-09-01 DIAGNOSIS — I2510 Atherosclerotic heart disease of native coronary artery without angina pectoris: Secondary | ICD-10-CM

## 2018-09-01 DIAGNOSIS — I10 Essential (primary) hypertension: Secondary | ICD-10-CM

## 2018-09-01 DIAGNOSIS — I8290 Acute embolism and thrombosis of unspecified vein: Secondary | ICD-10-CM | POA: Diagnosis not present

## 2018-09-01 DIAGNOSIS — E785 Hyperlipidemia, unspecified: Secondary | ICD-10-CM | POA: Diagnosis not present

## 2018-09-01 DIAGNOSIS — I350 Nonrheumatic aortic (valve) stenosis: Secondary | ICD-10-CM

## 2018-09-01 NOTE — Patient Instructions (Addendum)
Medication Instructions:  Your physician recommends that you continue on your current medications as directed. Please refer to the Current Medication list given to you today. If you need a refill on your cardiac medications before your next appointment, please call your pharmacy.   Lab work: None  If you have labs (blood work) drawn today and your tests are completely normal, you will receive your results only by: Marland Kitchen. MyChart Message (if you have MyChart) OR . A paper copy in the mail If you have any lab test that is abnormal or we need to change your treatment, we will call you to review the results.  Testing/Procedures: None   Follow-Up: [facility], you and your health needs are our priority.  As part of our continuing mission to provide you with exceptional heart care, we have created designated Provider Care Teams.  These Care Teams include your primary Cardiologist (physician) and Advanced Practice Providers (APPs -  Physician Assistants and Nurse Practitioners) who all work together to provide you with the care you need, when you need it. . Your physician recommends that you schedule a follow-up appointment in: 3-4 Months with Dr Katrinka BlazingSmith.  Any Other Special Instructions Will Be Listed Below (If Applicable).

## 2018-09-01 NOTE — Progress Notes (Signed)
Cardiology Office Note    Date:  09/03/2018   ID:  Gail Burroweggy L Hennick, DOB [birth year], MRN [id number]  PCP:  Creola Cornusso, John, MD  Cardiologist:  Dr. Katrinka BlazingSmith / Norma FredricksonLori Gerhardt   Chief Complaint  Patient presents with  . Hospitalization Follow-up    seen for Dr. Katrinka BlazingSmith    History of Present Illness:  [name] is a 72 y.o. female with PMH of recurrent DVT, recurrent PE status post inferior vena cava filter, hiatal hernia, history of chest pain, hypertension, hypothyroidism, aortic stenosis and hyperlipidemia.  She had a cardiac catheterization in 2009 which reportedly showed a normal coronaries.  Patient was seen by Dr. Katrinka BlazingSmith in 2017 for chest pain which was felt to be noncardiac.  She had a nuclear stress test that showed a large defect of moderate severity present in the basal inferior, mid inferior, mid inferolateral, apical inferior, apical lateral and apex location suspected artifact in the chest wall attenuation, EF was 63%.  Echocardiogram obtained on 01/12/2018 showed EF 55 to 60%, mildly thickened aortic valve with mild aortic stenosis.  More recently, patient presented to the hospital on 08/19/2018 with chest pain.  She did not have EKG changes.  Troponin was negative.  A coronary CT was obtained on 08/19/2018 which showed coronary calcium score of 135 which placed the patient and 32 percentile, normal coronary arteries and was by dominance, mild coronary artery disease with mild stenosis in the proximal LAD in the distal RCA.  FFR analysis did not show significant proximal stenosis, however distal posterolateral branch FFR was 0.76.  Patient presents today for cardiology office visit.  She has not had any further chest discomfort.  She think her symptom is more related to acid reflux that they says the chest pain occurred after eating food.  Despite her FFR in the distal posterior lateral branch was somewhat positive, however given lack of recurrent chest discomfort, I recommended medical  therapy at this time.  Patient was agreeable with this plan.  She is aware that if she does have recurrent chest discomfort, we may need cardiac catheterization for further assessment.  Otherwise she has no lower extremity edema, orthopnea or PND.   Past Medical History:  Diagnosis Date  . Anemia   . Arthritis    osteoarthritis  . Complication of anesthesia    hard time waking up after her gallbladder surgery 28 years ago  . GERD (gastroesophageal reflux disease)    hx of  . Heart murmur   . History of blood transfusion   . History of hiatal hernia    small  . History of kidney stones   . Hypercholesteremia    under control  . Hypertension   . Hypothyroidism   . Mild aortic stenosis    a. by echo 12/2017.  Marland Kitchen. PONV (postoperative nausea and vomiting)   . Pulmonary embolism (HCC)    bilateral lungs - 2008 after disc surgery    Past Surgical History:  Procedure Laterality Date  . ABDOMINAL HYSTERECTOMY  1975   partial  . APPENDECTOMY  1973  . BACK SURGERY     X14 1987-2009  . CARDIAC CATHETERIZATION  ~2010  . CARPAL TUNNEL RELEASE Bilateral   . CHOLECYSTECTOMY  1997  . COLONOSCOPY    . COLONOSCOPY WITH PROPOFOL N/A 12/05/2017   Procedure: COLONOSCOPY WITH PROPOFOL;  Surgeon: Carman ChingEdwards, James, MD;  Location: [facility];  Service: Endoscopy;  Laterality: N/A;  . DILATION AND CURETTAGE OF UTERUS    . ENDARTERECTOMY  Right 08/10/2013   Procedure: Excision of Venous Aneurysm Right Neck;  Surgeon: Larina Earthly, MD;  Location: [facility] OR;  Service: Vascular;  Laterality: Right;  . ESOPHAGOGASTRODUODENOSCOPY (EGD) WITH PROPOFOL N/A 12/05/2017   Procedure: ESOPHAGOGASTRODUODENOSCOPY (EGD) WITH PROPOFOL;  Surgeon: Carman Ching, MD;  Location: [facility];  Service: Endoscopy;  Laterality: N/A;  . filter Left 2009   IVC  . HIP SURGERY Left 2008  . JOINT REPLACEMENT Right 11/2003   (knee)multiple surgeries  . JOINT REPLACEMENT Left 12/2009   (knee)  . JOINT REPLACEMENT Left 2011, 2012     Hip  . MASS EXCISION  1974   uterus  . QUADRICEPS TENDON REPAIR Right 12/06/2016   Procedure: QUAD TENDON RECONSTRUCTION;  Surgeon: Durene Romans, MD;  Location: [facility];  Service: Orthopedics;  Laterality: Right;  Requests for 90 mins  . TONSILLECTOMY  1986  . TOTAL HIP REVISION Left 12/07/2013   Procedure:  REVISION  CONSTRAINED LINER LEFT TOTAL HIP ;  Surgeon: Shelda Pal, MD;  Location: [facility];  Service: Orthopedics;  Laterality: Left;  . TOTAL SHOULDER REPLACEMENT Right 11/30/2010  . TUBAL LIGATION  1973    Current Medications: Outpatient Medications Prior to Visit  Medication Sig Dispense Refill  . ALPRAZolam (XANAX) 0.5 MG tablet Take 0.5 mg by mouth 2 (two) times daily.     . Ascorbic Acid (VITAMIN C PO) Take 2 tablets by mouth daily.     Marland Kitchen atenolol (TENORMIN) 25 MG tablet Take 25 mg by mouth See admin instructions. Take one tablet (25 mg) by mouth every morning, may also take a 2nd tablet (25 mg) if SBP >150    . baclofen (LIORESAL) 10 MG tablet Take 1 tablet (10 mg total) by mouth every 8 (eight) hours. (Patient taking differently: Take 10 mg by mouth 3 (three) times daily. ) 30 each 0  . CINNAMON PO Take 1,500 mg by mouth 2 (two) times daily.     . Collagen-Boron-Hyaluronic Acid ([employer] JOINT HE[employer] TRIPLE ACTION) 10-5-3.3 MG TABS Take 1 tablet by mouth daily.     Marland Kitchen docusate sodium (COLACE) 250 MG capsule Take 250 mg by mouth at bedtime.     . famotidine (PEPCID) 40 MG tablet Take 40 mg by mouth at bedtime.  3  . furosemide (LASIX) 20 MG tablet Take 20 mg by mouth See admin instructions. Take one tablet (20 mg) by mouth - twice daily - morning and 3pm    . gabapentin (NEURONTIN) 600 MG tablet Take 600 mg by mouth 3 (three) times daily.     Marland Kitchen HYDROcodone-acetaminophen (NORCO/VICODIN) 5-325 MG tablet Take 1 tablet by mouth 3 (three) times daily.     Marland Kitchen levothyroxine (SYNTHROID, LEVOTHROID) 50 MCG tablet Take 25 mcg by mouth every Monday, Wednesday, and Friday.  1  . losartan  (COZAAR) 25 MG tablet Take 25 mg by mouth at bedtime.     . metoCLOPramide (REGLAN) 5 MG tablet     . Omega-3 Fatty Acids (FISH OIL PO) Take 2 capsules by mouth daily.     . ondansetron (ZOFRAN-ODT) 4 MG disintegrating tablet Take 4 mg by mouth 2 (two) times daily as needed.  3  . OVER THE COUNTER MEDICATION Take 2 tablets by mouth daily. Multivitamin plus Omega 3 supplement    . OVER THE COUNTER MEDICATION Apply 1 application topically 3 (three) times daily as needed (joint pain). Australian Dream Cream- Muscle Spasms     . pantoprazole (PROTONIX) 40 MG tablet Take 1 tablet (  40 mg total) by mouth 2 (two) times daily. 60 tablet 0  . Polyethyl Glycol-Propyl Glycol (SYSTANE OP) Place 1-2 drops into both eyes at bedtime as needed (for dry eyes).    . Probiotic Product (PROBIOTIC PO) Take 1 capsule by mouth daily.    . Rivaroxaban (XARELTO) 20 MG TABS tablet Take 20 mg by mouth daily at 3 pm.     . simvastatin (ZOCOR) 20 MG tablet Take 20 mg by mouth at bedtime.     . sucralfate (CARAFATE) 1 g tablet Take 1 tablet (1 g total) by mouth 2 (two) times daily. 30 tablet 0  . vitamin B-12 (CYANOCOBALAMIN) 500 MCG tablet Take 500 mcg by mouth daily.    . famotidine (PEPCID AC) 10 MG chewable tablet Chew 4 tablets (40 mg total) by mouth daily with breakfast. 60 tablet 0  . famotidine (PEPCID) 20 MG tablet Take 40 mg by mouth at bedtime.     No facility-administered medications prior to visit.      Allergies:   Celecoxib   Social History   Socioeconomic History  . Marital status: Married    Spouse name: Not on file  . Number of children: Not on file  . Years of education: Not on file  . Highest education level: Not on file  Occupational History  . Not on file  Social Needs  . Financial resource strain: Not on file  . Food insecurity:    Worry: Not on file    Inability: Not on file  . Transportation needs:    Medical: Not on file    Non-medical: Not on file  Tobacco Use  . Smoking status:  Never Smoker  . Smokeless tobacco: Never Used  Substance and Sexual Activity  . Alcohol use: No  . Drug use: No  . Sexual activity: Not Currently  Lifestyle  . Physical activity:    Days per week: Not on file    Minutes per session: Not on file  . Stress: Not on file  Relationships  . Social connections:    Talks on phone: Not on file    Gets together: Not on file    Attends religious service: Not on file    Active member of club or organization: Not on file    Attends meetings of clubs or organizations: Not on file    Relationship status: Not on file  Other Topics Concern  . Not on file  Social History Narrative  . Not on file     Family History:  The patient's family history includes Deep vein thrombosis in her brother; Diabetes in her father, mother, and sister; Heart attack in her father; Heart disease in her mother; Hyperlipidemia in her brother, father, mother, and sister; Hypertension in her brother, father, mother, and sister; Other in her father; Peripheral vascular disease in her father.   ROS:   Please see the history of present illness.    ROS All other systems reviewed and are negative.   PHYSICAL EXAM:   VS:  BP (!) 158/75   Pulse (!) 56   Resp 16   Ht 5' 3.5" (1.613 m)   Wt 262 lb 3.2 oz (118.9 kg)   SpO2 98%   BMI 45.72 kg/m    GEN: Well nourished, well developed, in no acute distress  HEENT: normal  Neck: no JVD, carotid bruits, or masses Cardiac: RRR; no murmurs, rubs, or gallops,no edema  Respiratory:  clear to auscultation bilaterally, normal work of breathing GI:  soft, nontender, nondistended, + BS MS: no deformity or atrophy  Skin: warm and dry, no rash Neuro:  Alert and Oriented x 3, Strength and sensation are intact Psych: euthymic mood, full affect  Wt Readings from Last 3 Encounters:  09/01/18 262 lb 3.2 oz (118.9 kg)  08/18/18 264 lb 1.8 oz (119.8 kg)  01/04/18 275 lb 12.8 oz (125.1 kg)      Studies/Labs Reviewed:   EKG:  EKG is  not ordered today.    Recent Labs: 08/18/2018: ALT 14 08/19/2018: BUN 14; Creatinine, Ser 0.92; Hemoglobin 11.6; Platelets 263; Potassium 3.9; Sodium 139   Lipid Panel No results found for: CHOL, TRIG, HDL, CHOLHDL, VLDL, LDLCALC, LDLDIRECT  Additional studies/ records that were reviewed today include:   Echo 01/12/2018 LV EF: 55% -   60% Study Conclusions  - Left ventricle: The cavity size was normal. Wall thickness was   normal. Systolic function was normal. The estimated ejection   fraction was in the range of 55% to 60%. - Aortic valve: AV is difficult to see well It is mildly thickened.   Peak and mean gradients through the valve are 24 and 11 mm Hg   respectively consistent with mild AS.    ASSESSMENT:    1. Coronary artery disease involving native coronary artery of native heart without angina pectoris   2. Deep vein thrombosis (DVT) of non-extremity vein, unspecified chronicity   3. Essential hypertension   4. Hypothyroidism, unspecified type   5. Hyperlipidemia, unspecified hyperlipidemia type   6. Nonrheumatic aortic valve stenosis      PLAN:  In order of problems listed above:  1. CAD: Patient was discharged recently after coronary CT shows no significant artery disease, she was released before FFR came back.  I reviewed FFR report with the patient.  The posterolateral distal branch had FFR of 0.76 which may suggest significant disease.  However her recent chest pain was very atypical and occurred after she ate food.  She is taking it is more consistent with heartburn.  Since she was released from the hospital, she has not had any recurrent symptoms.  At this point I recommend continue observation.  If she does have recurrence of the chest discomfort especially with exertion, she will need to let us know and potentially consider cardiac catheterization.  2. Recurrent DVT/PE: Continue on Xarelto  3. Hypertension: Blood pressure well controlled  4. Hypothyroidism:  On Synthroid, managed by primary care provider  5. Hyperlipidemia: On Zocor 20 mg daily.  Will defer lipid panel to primary care provider  6. Aortic valve stenosis: Mild on last echocardiogram.    Medication Adjustments/Labs and Tests Ordered: Current medicines are reviewed at length with the patient today.  Concerns regarding medicines are outlined above.  Medication changes, Labs and Tests ordered today are listed in the Patient Instructions below. Patient Instructions  Medication Instructions:  Your physician recommends that you continue on your current medications as directed. Please refer to the Current Medication list given to you today. If you need a refill on your cardiac medications before your next appointment, please call your pharmacy.   Lab work: None  If you have labs (blood work) drawn today and your tests are completely normal, you will receive your results only by: Marland Kitchen MyChart Message (if you have MyChart) OR . A paper copy in the mail If you have any lab test that is abnormal or we need to change your treatment, we will call you to review the results.  Testing/Procedures: None   Follow-Up: At [facility], you and your health needs are our priority.  As part of our continuing mission to provide you with exceptional heart care, we have created designated Provider Care Teams.  These Care Teams include your primary Cardiologist (physician) and Advanced Practice Providers (APPs -  Physician Assistants and Nurse Practitioners) who all work together to provide you with the care you need, when you need it. . Your physician recommends that you schedule a follow-up appointment in: 3-4 Months with Dr Katrinka Blazing.  Any Other Special Instructions Will Be Listed Below (If Applicable).      Ramond Dial, [place]  09/03/2018 11:48 PM    [facility] [address] [place], [place], [place]  [address] Phone: [phone]; Fax: [phone]

## 2018-09-03 ENCOUNTER — Encounter: Payer: Self-pay | Admitting: Physician Assistant

## 2018-09-07 ENCOUNTER — Ambulatory Visit: Payer: Medicare HMO | Admitting: Physician Assistant

## 2018-11-16 DIAGNOSIS — I700 Atherosclerosis of aorta: Secondary | ICD-10-CM | POA: Diagnosis not present

## 2018-11-16 DIAGNOSIS — F1120 Opioid dependence, uncomplicated: Secondary | ICD-10-CM | POA: Diagnosis not present

## 2018-11-16 DIAGNOSIS — R0789 Other chest pain: Secondary | ICD-10-CM | POA: Diagnosis not present

## 2018-11-16 DIAGNOSIS — G894 Chronic pain syndrome: Secondary | ICD-10-CM | POA: Diagnosis not present

## 2018-11-16 DIAGNOSIS — Z7901 Long term (current) use of anticoagulants: Secondary | ICD-10-CM | POA: Diagnosis not present

## 2018-11-16 DIAGNOSIS — I10 Essential (primary) hypertension: Secondary | ICD-10-CM | POA: Diagnosis not present

## 2018-11-16 DIAGNOSIS — Z6841 Body Mass Index (BMI) 40.0 and over, adult: Secondary | ICD-10-CM | POA: Diagnosis not present

## 2018-11-16 DIAGNOSIS — F1320 Sedative, hypnotic or anxiolytic dependence, uncomplicated: Secondary | ICD-10-CM | POA: Diagnosis not present

## 2018-11-16 DIAGNOSIS — R2689 Other abnormalities of gait and mobility: Secondary | ICD-10-CM | POA: Diagnosis not present

## 2018-11-16 DIAGNOSIS — Z1339 Encounter for screening examination for other mental health and behavioral disorders: Secondary | ICD-10-CM | POA: Diagnosis not present

## 2018-11-16 DIAGNOSIS — I350 Nonrheumatic aortic (valve) stenosis: Secondary | ICD-10-CM | POA: Diagnosis not present

## 2018-12-12 ENCOUNTER — Ambulatory Visit: Payer: Medicare HMO | Admitting: Interventional Cardiology

## 2018-12-12 ENCOUNTER — Encounter: Payer: Self-pay | Admitting: Interventional Cardiology

## 2018-12-12 VITALS — BP 134/78 | HR 63 | Ht 63.5 in | Wt 262.6 lb

## 2018-12-12 DIAGNOSIS — I10 Essential (primary) hypertension: Secondary | ICD-10-CM | POA: Diagnosis not present

## 2018-12-12 DIAGNOSIS — E785 Hyperlipidemia, unspecified: Secondary | ICD-10-CM | POA: Diagnosis not present

## 2018-12-12 DIAGNOSIS — Z7901 Long term (current) use of anticoagulants: Secondary | ICD-10-CM | POA: Diagnosis not present

## 2018-12-12 DIAGNOSIS — I2510 Atherosclerotic heart disease of native coronary artery without angina pectoris: Secondary | ICD-10-CM | POA: Diagnosis not present

## 2018-12-12 DIAGNOSIS — I2699 Other pulmonary embolism without acute cor pulmonale: Secondary | ICD-10-CM

## 2018-12-12 DIAGNOSIS — N182 Chronic kidney disease, stage 2 (mild): Secondary | ICD-10-CM | POA: Diagnosis not present

## 2018-12-12 NOTE — Patient Instructions (Signed)
Medication Instructions:  Your physician recommends that you continue on your current medications as directed. Please refer to the Current Medication list given to you today.  If you need a refill on your cardiac medications before your next appointment, please call your pharmacy.   Lab work: None If you have labs (blood work) drawn today and your tests are completely normal, you will receive your results only by: . MyChart Message (if you have MyChart) OR . A paper copy in the mail If you have any lab test that is abnormal or we need to change your treatment, we will call you to review the results.  Testing/Procedures: None  Follow-Up: Your physician recommends that you schedule a follow-up appointment as needed with Dr. Smith.   Any Other Special Instructions Will Be Listed Below (If Applicable).    

## 2018-12-12 NOTE — Progress Notes (Signed)
Cardiology Office Note:    Date:  12/12/2018   ID:  [name], DOB Sep 08, 1946, MRN [id number]  PCP:  Creola Corn, MD  Cardiologist:  Lesleigh Noe, MD   Referring MD: Creola Corn, MD   Chief Complaint  Patient presents with  . Atrial Fibrillation  . Coronary Artery Disease    History of Present Illness:    [name] is a 73 y.o. female with a hx of recurrent DVT, recurrent PE status post inferior vena cava filter, hiatal hernia, history of chest pain, hypertension, hypothyroidism, aortic stenosis and hyperlipidemia.  She had a cardiac catheterization in 2009 which reportedly showed a normal coronaries.  Coronary CT Angio performed 08/2018 demonstrated nonobstructive disease with suggestion of ischemia and an inferolateral branch.  She is doing well.  We spent significant time discussing the results of her recent cardiac evaluation.  The evaluation turned out quite good.  No significant obstructive disease was noted.  No significant valvular disease.  Past Medical History:  Diagnosis Date  . Anemia   . Arthritis    osteoarthritis  . Complication of anesthesia    hard time waking up after her gallbladder surgery 28 years ago  . GERD (gastroesophageal reflux disease)    hx of  . Heart murmur   . History of blood transfusion   . History of hiatal hernia    small  . History of kidney stones   . Hypercholesteremia    under control  . Hypertension   . Hypothyroidism   . Mild aortic stenosis    a. by echo 12/2017.  Marland Kitchen PONV (postoperative nausea and vomiting)   . Pulmonary embolism (HCC)    bilateral lungs - 2008 after disc surgery    Past Surgical History:  Procedure Laterality Date  . ABDOMINAL HYSTERECTOMY  1975   partial  . APPENDECTOMY  1973  . BACK SURGERY     X14 1987-2009  . CARDIAC CATHETERIZATION  ~2010  . CARPAL TUNNEL RELEASE Bilateral   . CHOLECYSTECTOMY  1997  . COLONOSCOPY    . COLONOSCOPY WITH PROPOFOL N/A 12/05/2017   Procedure:  COLONOSCOPY WITH PROPOFOL;  Surgeon: Carman Ching, MD;  Location: [facility];  Service: Endoscopy;  Laterality: N/A;  . DILATION AND CURETTAGE OF UTERUS    . ENDARTERECTOMY Right 08/10/2013   Procedure: Excision of Venous Aneurysm Right Neck;  Surgeon: Larina Earthly, MD;  Location: [facility] OR;  Service: Vascular;  Laterality: Right;  . ESOPHAGOGASTRODUODENOSCOPY (EGD) WITH PROPOFOL N/A 12/05/2017   Procedure: ESOPHAGOGASTRODUODENOSCOPY (EGD) WITH PROPOFOL;  Surgeon: Carman Ching, MD;  Location: [facility];  Service: Endoscopy;  Laterality: N/A;  . filter Left 2009   IVC  . HIP SURGERY Left 2008  . JOINT REPLACEMENT Right 11/2003   (knee)multiple surgeries  . JOINT REPLACEMENT Left 12/2009   (knee)  . JOINT REPLACEMENT Left 2011, 2012   Hip  . MASS EXCISION  1974   uterus  . QUADRICEPS TENDON REPAIR Right 12/06/2016   Procedure: QUAD TENDON RECONSTRUCTION;  Surgeon: Durene Romans, MD;  Location: [facility];  Service: Orthopedics;  Laterality: Right;  Requests for 90 mins  . TONSILLECTOMY  1986  . TOTAL HIP REVISION Left 12/07/2013   Procedure:  REVISION  CONSTRAINED LINER LEFT TOTAL HIP ;  Surgeon: Shelda Pal, MD;  Location: [facility];  Service: Orthopedics;  Laterality: Left;  . TOTAL SHOULDER REPLACEMENT Right 11/30/2010  . TUBAL LIGATION  1973    Current Medications: Current Meds  Medication  Sig  . ALPRAZolam (XANAX) 0.5 MG tablet Take 0.5 mg by mouth 2 (two) times daily.   Marland Kitchen atenolol (TENORMIN) 25 MG tablet Take 25 mg by mouth See admin instructions. Take one tablet (25 mg) by mouth every morning, may also take a 2nd tablet (25 mg) if SBP >150  . baclofen (LIORESAL) 10 MG tablet Take 1 tablet (10 mg total) by mouth every 8 (eight) hours.  Marland Kitchen CINNAMON PO Take 1,500 mg by mouth 2 (two) times daily.   Marland Kitchen docusate sodium (COLACE) 250 MG capsule Take 250 mg by mouth at bedtime.   . famotidine (PEPCID) 40 MG tablet Take 40 mg by mouth at bedtime.  . furosemide (LASIX) 20 MG tablet Take 20 mg  by mouth See admin instructions. Take one tablet (20 mg) by mouth - twice daily - morning and 3pm  . gabapentin (NEURONTIN) 600 MG tablet Take 600 mg by mouth 3 (three) times daily.   Marland Kitchen HYDROcodone-acetaminophen (NORCO/VICODIN) 5-325 MG tablet Take 1 tablet by mouth 3 (three) times daily.   Marland Kitchen levothyroxine (SYNTHROID, LEVOTHROID) 50 MCG tablet Take 25 mcg by mouth every Monday, Wednesday, and Friday.  . losartan (COZAAR) 25 MG tablet Take 25 mg by mouth at bedtime.   . metoCLOPramide (REGLAN) 5 MG tablet Take 5 mg by mouth 2 (two) times daily.   . ondansetron (ZOFRAN-ODT) 4 MG disintegrating tablet Take 4 mg by mouth 2 (two) times daily as needed.  Marland Kitchen OVER THE COUNTER MEDICATION Take 2 tablets by mouth daily. Multivitamin plus Omega 3 supplement  . OVER THE COUNTER MEDICATION Apply 1 application topically 3 (three) times daily as needed (joint pain). [place] Dream Cream- Muscle Spasms   . pantoprazole (PROTONIX) 40 MG tablet Take 1 tablet (40 mg total) by mouth 2 (two) times daily.  Bertram Gala Glycol-Propyl Glycol (SYSTANE OP) Place 1-2 drops into both eyes at bedtime as needed (for dry eyes).  . Probiotic Product (PROBIOTIC PO) Take 1 capsule by mouth daily.  . Rivaroxaban (XARELTO) 20 MG TABS tablet Take 20 mg by mouth daily at 3 pm.   . simvastatin (ZOCOR) 20 MG tablet Take 20 mg by mouth at bedtime.   . vitamin B-12 (CYANOCOBALAMIN) 500 MCG tablet Take 500 mcg by mouth daily.     Allergies:   Celecoxib   Social History   Socioeconomic History  . Marital status: Married    Spouse name: Not on file  . Number of children: Not on file  . Years of education: Not on file  . Highest education level: Not on file  Occupational History  . Not on file  Social Needs  . Financial resource strain: Not on file  . Food insecurity:    Worry: Not on file    Inability: Not on file  . Transportation needs:    Medical: Not on file    Non-medical: Not on file  Tobacco Use  . Smoking status:  Never Smoker  . Smokeless tobacco: Never Used  Substance and Sexual Activity  . Alcohol use: No  . Drug use: No  . Sexual activity: Not Currently  Lifestyle  . Physical activity:    Days per week: Not on file    Minutes per session: Not on file  . Stress: Not on file  Relationships  . Social connections:    Talks on phone: Not on file    Gets together: Not on file    Attends religious service: Not on file    Active member  of club or organization: Not on file    Attends meetings of clubs or organizations: Not on file    Relationship status: Not on file  Other Topics Concern  . Not on file  Social History Narrative  . Not on file     Family History: The patient's family history includes Deep vein thrombosis in her brother; Diabetes in her father, mother, and sister; Heart attack in her father; Heart disease in her mother; Hyperlipidemia in her brother, father, mother, and sister; Hypertension in her brother, father, mother, and sister; Other in her father; Peripheral vascular disease in her father.  ROS:   Please see the history of present illness.    Joint and ankle swelling.  Some difficulty with balance.  Back pain.  All other systems reviewed and are negative.  EKGs/Labs/Other Studies Reviewed:    The following studies were reviewed today: Coronary CT angiography 08/19/2018: IMPRESSION: 1. The patient's coronary artery calcium score is 135, which places the patient in the 72 percentile. This is greater than expected for age and gender matched peers.  2. Normal coronary origin with right dominance.  3. Mild CAD, CADRADS = 2. Mild stenosis of the Proximal LAD and the distal RCA.  Coronary CT fractional flow reserve: IMPRESSION: 1. CT FFR analysis showed no significant proximal stenosis. Distal posterolateral branch FFR = 0.76.  EKG:  EKG not repeated  Recent Labs: 08/18/2018: ALT 14 08/19/2018: BUN 14; Creatinine, Ser 0.92; Hemoglobin 11.6; Platelets 263;  Potassium 3.9; Sodium 139  Recent Lipid Panel No results found for: CHOL, TRIG, HDL, CHOLHDL, VLDL, LDLCALC, LDLDIRECT  Physical Exam:    VS:  BP 134/78   Pulse 63   Ht 5' 3.5" (1.613 m)   Wt 262 lb 9.6 oz (119.1 kg)   SpO2 95%   BMI 45.79 kg/m     Wt Readings from Last 3 Encounters:  12/12/18 262 lb 9.6 oz (119.1 kg)  09/01/18 262 lb 3.2 oz (118.9 kg)  08/18/18 264 lb 1.8 oz (119.8 kg)     GEN: Obese. No acute distress HEENT: Normal NECK: No JVD. LYMPHATICS: No lymphadenopathy CARDIAC: RRR.  1/6 to 2/6 systolic murmur, no gallop, 1-2+edema VASCULAR: No Pulses, no bruits RESPIRATORY:  Clear to auscultation without rales, wheezing or rhonchi  ABDOMEN: Soft, non-tender, non-distended, No pulsatile mass, MUSCULOSKELETAL: No deformity  SKIN: Warm and dry NEUROLOGIC:  Alert and oriented x 3 PSYCHIATRIC:  Normal affect   ASSESSMENT:    1. Coronary artery disease involving native coronary artery of native heart without angina pectoris   2. Chronic anticoagulation   3. Recurrent pulmonary emboli (HCC)   4. Hyperlipidemia, unspecified hyperlipidemia type   5. Chronic kidney disease, stage II (mild)   6. Essential hypertension    PLAN:    In order of problems listed above:  1. Nonobstructive CAD.  Secondary risk modification discussed in detail.  See below. 2. Not addressed 3. Not addressed. 4. LDL target less than 70.  Slightly elevated.  She is trying to address with weight loss and physical activity. 5. Not addressed 6. Target blood pressure 130/80 mmHg.  PRN clinical follow-up.  Overall education and awareness concerning primary/secondary risk prevention was discussed in detail: LDL less than 70, hemoglobin A1c less than 7, blood pressure target less than 130/80 mmHg, >150 minutes of moderate aerobic activity per week, avoidance of smoking, weight control (via diet and exercise), and continued surveillance/management of/for obstructive sleep apnea.     Medication  Adjustments/Labs and Tests Ordered:  Current medicines are reviewed at length with the patient today.  Concerns regarding medicines are outlined above.  No orders of the defined types were placed in this encounter.  No orders of the defined types were placed in this encounter.   Patient Instructions  Medication Instructions:  Your physician recommends that you continue on your current medications as directed. Please refer to the Current Medication list given to you today.  If you need a refill on your cardiac medications before your next appointment, please call your pharmacy.   Lab work: None If you have labs (blood work) drawn today and your tests are completely normal, you will receive your results only by: Marland Kitchen MyChart Message (if you have MyChart) OR . A paper copy in the mail If you have any lab test that is abnormal or we need to change your treatment, we will call you to review the results.  Testing/Procedures: None  Follow-Up: Your physician recommends that you schedule a follow-up appointment as needed with Dr. Katrinka Blazing.    Any Other Special Instructions Will Be Listed Below (If Applicable).       Signed, Lesleigh Noe, MD  12/12/2018 2:04 PM    [facility]

## 2019-01-03 ENCOUNTER — Ambulatory Visit: Payer: Medicare HMO | Admitting: Nurse Practitioner

## 2019-02-28 DIAGNOSIS — Z95828 Presence of other vascular implants and grafts: Secondary | ICD-10-CM | POA: Diagnosis not present

## 2019-02-28 DIAGNOSIS — Z86711 Personal history of pulmonary embolism: Secondary | ICD-10-CM | POA: Diagnosis not present

## 2019-02-28 DIAGNOSIS — M545 Low back pain: Secondary | ICD-10-CM | POA: Diagnosis not present

## 2019-02-28 DIAGNOSIS — Z7901 Long term (current) use of anticoagulants: Secondary | ICD-10-CM | POA: Diagnosis not present

## 2019-02-28 DIAGNOSIS — K219 Gastro-esophageal reflux disease without esophagitis: Secondary | ICD-10-CM | POA: Diagnosis not present

## 2019-03-22 DIAGNOSIS — Z1331 Encounter for screening for depression: Secondary | ICD-10-CM | POA: Diagnosis not present

## 2019-03-22 DIAGNOSIS — Z7901 Long term (current) use of anticoagulants: Secondary | ICD-10-CM | POA: Diagnosis not present

## 2019-03-22 DIAGNOSIS — K30 Functional dyspepsia: Secondary | ICD-10-CM | POA: Diagnosis not present

## 2019-03-22 DIAGNOSIS — I517 Cardiomegaly: Secondary | ICD-10-CM | POA: Diagnosis not present

## 2019-03-22 DIAGNOSIS — I350 Nonrheumatic aortic (valve) stenosis: Secondary | ICD-10-CM | POA: Diagnosis not present

## 2019-03-22 DIAGNOSIS — F1120 Opioid dependence, uncomplicated: Secondary | ICD-10-CM | POA: Diagnosis not present

## 2019-03-22 DIAGNOSIS — R2689 Other abnormalities of gait and mobility: Secondary | ICD-10-CM | POA: Diagnosis not present

## 2019-03-22 DIAGNOSIS — I700 Atherosclerosis of aorta: Secondary | ICD-10-CM | POA: Diagnosis not present

## 2019-03-22 DIAGNOSIS — M549 Dorsalgia, unspecified: Secondary | ICD-10-CM | POA: Diagnosis not present

## 2019-03-22 DIAGNOSIS — F1320 Sedative, hypnotic or anxiolytic dependence, uncomplicated: Secondary | ICD-10-CM | POA: Diagnosis not present

## 2019-03-22 DIAGNOSIS — E039 Hypothyroidism, unspecified: Secondary | ICD-10-CM | POA: Diagnosis not present

## 2019-03-22 DIAGNOSIS — I10 Essential (primary) hypertension: Secondary | ICD-10-CM | POA: Diagnosis not present

## 2019-03-26 DIAGNOSIS — Z6841 Body Mass Index (BMI) 40.0 and over, adult: Secondary | ICD-10-CM | POA: Diagnosis not present

## 2019-03-26 DIAGNOSIS — Z1231 Encounter for screening mammogram for malignant neoplasm of breast: Secondary | ICD-10-CM | POA: Diagnosis not present

## 2019-03-26 DIAGNOSIS — Z01419 Encounter for gynecological examination (general) (routine) without abnormal findings: Secondary | ICD-10-CM | POA: Diagnosis not present

## 2019-03-27 ENCOUNTER — Other Ambulatory Visit: Payer: Self-pay | Admitting: Obstetrics and Gynecology

## 2019-03-27 DIAGNOSIS — E041 Nontoxic single thyroid nodule: Secondary | ICD-10-CM

## 2019-03-28 ENCOUNTER — Ambulatory Visit
Admission: RE | Admit: 2019-03-28 | Discharge: 2019-03-28 | Disposition: A | Discharge status: Home / Self Care | Payer: PPO | Source: Ambulatory Visit | Attending: Obstetrics and Gynecology | Admitting: Obstetrics and Gynecology

## 2019-03-28 DIAGNOSIS — E041 Nontoxic single thyroid nodule: Secondary | ICD-10-CM

## 2019-04-06 DIAGNOSIS — H00021 Hordeolum internum right upper eyelid: Secondary | ICD-10-CM | POA: Diagnosis not present

## 2019-04-26 DIAGNOSIS — F1320 Sedative, hypnotic or anxiolytic dependence, uncomplicated: Secondary | ICD-10-CM | POA: Diagnosis not present

## 2019-04-26 DIAGNOSIS — K219 Gastro-esophageal reflux disease without esophagitis: Secondary | ICD-10-CM | POA: Diagnosis not present

## 2019-04-26 DIAGNOSIS — F419 Anxiety disorder, unspecified: Secondary | ICD-10-CM | POA: Diagnosis not present

## 2019-04-26 DIAGNOSIS — R51 Headache: Secondary | ICD-10-CM | POA: Diagnosis not present

## 2019-04-26 DIAGNOSIS — I10 Essential (primary) hypertension: Secondary | ICD-10-CM | POA: Diagnosis not present

## 2019-05-02 DIAGNOSIS — I10 Essential (primary) hypertension: Secondary | ICD-10-CM | POA: Diagnosis not present

## 2019-05-02 DIAGNOSIS — R1013 Epigastric pain: Secondary | ICD-10-CM | POA: Diagnosis not present

## 2019-05-02 DIAGNOSIS — D649 Anemia, unspecified: Secondary | ICD-10-CM | POA: Diagnosis not present

## 2019-05-02 DIAGNOSIS — R110 Nausea: Secondary | ICD-10-CM | POA: Diagnosis not present

## 2019-05-02 DIAGNOSIS — Z7901 Long term (current) use of anticoagulants: Secondary | ICD-10-CM | POA: Diagnosis not present

## 2019-05-02 DIAGNOSIS — K219 Gastro-esophageal reflux disease without esophagitis: Secondary | ICD-10-CM | POA: Diagnosis not present

## 2019-05-04 DIAGNOSIS — K219 Gastro-esophageal reflux disease without esophagitis: Secondary | ICD-10-CM | POA: Diagnosis not present

## 2019-05-22 DIAGNOSIS — R0789 Other chest pain: Secondary | ICD-10-CM | POA: Diagnosis not present

## 2019-05-22 DIAGNOSIS — Z86711 Personal history of pulmonary embolism: Secondary | ICD-10-CM | POA: Diagnosis not present

## 2019-05-22 DIAGNOSIS — K219 Gastro-esophageal reflux disease without esophagitis: Secondary | ICD-10-CM | POA: Diagnosis not present

## 2019-05-22 DIAGNOSIS — Z95828 Presence of other vascular implants and grafts: Secondary | ICD-10-CM | POA: Diagnosis not present

## 2019-05-22 DIAGNOSIS — K3184 Gastroparesis: Secondary | ICD-10-CM | POA: Diagnosis not present

## 2019-05-22 DIAGNOSIS — Z86718 Personal history of other venous thrombosis and embolism: Secondary | ICD-10-CM | POA: Diagnosis not present

## 2019-05-22 DIAGNOSIS — Z7901 Long term (current) use of anticoagulants: Secondary | ICD-10-CM | POA: Diagnosis not present

## 2019-07-06 DIAGNOSIS — Z23 Encounter for immunization: Secondary | ICD-10-CM | POA: Diagnosis not present

## 2019-07-13 DIAGNOSIS — M859 Disorder of bone density and structure, unspecified: Secondary | ICD-10-CM | POA: Diagnosis not present

## 2019-07-13 DIAGNOSIS — E038 Other specified hypothyroidism: Secondary | ICD-10-CM | POA: Diagnosis not present

## 2019-07-18 DIAGNOSIS — R82998 Other abnormal findings in urine: Secondary | ICD-10-CM | POA: Diagnosis not present

## 2019-07-18 DIAGNOSIS — I10 Essential (primary) hypertension: Secondary | ICD-10-CM | POA: Diagnosis not present

## 2019-07-20 DIAGNOSIS — K30 Functional dyspepsia: Secondary | ICD-10-CM | POA: Diagnosis not present

## 2019-07-20 DIAGNOSIS — H269 Unspecified cataract: Secondary | ICD-10-CM | POA: Diagnosis not present

## 2019-07-20 DIAGNOSIS — F1320 Sedative, hypnotic or anxiolytic dependence, uncomplicated: Secondary | ICD-10-CM | POA: Diagnosis not present

## 2019-07-20 DIAGNOSIS — K219 Gastro-esophageal reflux disease without esophagitis: Secondary | ICD-10-CM | POA: Diagnosis not present

## 2019-07-20 DIAGNOSIS — R0789 Other chest pain: Secondary | ICD-10-CM | POA: Diagnosis not present

## 2019-07-20 DIAGNOSIS — Z0000 Encounter for general adult medical examination without abnormal findings: Secondary | ICD-10-CM | POA: Diagnosis not present

## 2019-07-20 DIAGNOSIS — R2689 Other abnormalities of gait and mobility: Secondary | ICD-10-CM | POA: Diagnosis not present

## 2019-07-20 DIAGNOSIS — I350 Nonrheumatic aortic (valve) stenosis: Secondary | ICD-10-CM | POA: Diagnosis not present

## 2019-07-20 DIAGNOSIS — R519 Headache, unspecified: Secondary | ICD-10-CM | POA: Diagnosis not present

## 2019-07-20 DIAGNOSIS — I700 Atherosclerosis of aorta: Secondary | ICD-10-CM | POA: Diagnosis not present

## 2019-07-23 DIAGNOSIS — H5201 Hypermetropia, right eye: Secondary | ICD-10-CM | POA: Diagnosis not present

## 2019-07-23 DIAGNOSIS — Z961 Presence of intraocular lens: Secondary | ICD-10-CM | POA: Diagnosis not present

## 2019-07-23 DIAGNOSIS — H52201 Unspecified astigmatism, right eye: Secondary | ICD-10-CM | POA: Diagnosis not present

## 2019-07-23 DIAGNOSIS — H25811 Combined forms of age-related cataract, right eye: Secondary | ICD-10-CM | POA: Diagnosis not present

## 2019-07-23 DIAGNOSIS — H31012 Macula scars of posterior pole (postinflammatory) (post-traumatic), left eye: Secondary | ICD-10-CM | POA: Diagnosis not present

## 2019-07-23 DIAGNOSIS — H524 Presbyopia: Secondary | ICD-10-CM | POA: Diagnosis not present

## 2019-08-29 DIAGNOSIS — M2170 Unequal limb length (acquired), unspecified site: Secondary | ICD-10-CM | POA: Diagnosis not present

## 2019-08-29 DIAGNOSIS — Z6841 Body Mass Index (BMI) 40.0 and over, adult: Secondary | ICD-10-CM | POA: Diagnosis not present

## 2019-08-29 DIAGNOSIS — M25512 Pain in left shoulder: Secondary | ICD-10-CM | POA: Diagnosis not present

## 2019-08-29 DIAGNOSIS — Z96642 Presence of left artificial hip joint: Secondary | ICD-10-CM | POA: Diagnosis not present

## 2019-08-29 DIAGNOSIS — M7062 Trochanteric bursitis, left hip: Secondary | ICD-10-CM | POA: Diagnosis not present

## 2019-09-04 DIAGNOSIS — G894 Chronic pain syndrome: Secondary | ICD-10-CM | POA: Diagnosis not present

## 2019-09-04 DIAGNOSIS — K219 Gastro-esophageal reflux disease without esophagitis: Secondary | ICD-10-CM | POA: Diagnosis not present

## 2019-09-04 DIAGNOSIS — Z86718 Personal history of other venous thrombosis and embolism: Secondary | ICD-10-CM | POA: Diagnosis not present

## 2019-09-04 DIAGNOSIS — Z7901 Long term (current) use of anticoagulants: Secondary | ICD-10-CM | POA: Diagnosis not present

## 2019-09-04 DIAGNOSIS — K3184 Gastroparesis: Secondary | ICD-10-CM | POA: Diagnosis not present

## 2019-09-06 DIAGNOSIS — M6281 Muscle weakness (generalized): Secondary | ICD-10-CM | POA: Diagnosis not present

## 2019-11-08 DIAGNOSIS — Z1211 Encounter for screening for malignant neoplasm of colon: Secondary | ICD-10-CM | POA: Diagnosis not present

## 2019-11-08 DIAGNOSIS — K3184 Gastroparesis: Secondary | ICD-10-CM | POA: Diagnosis not present

## 2019-11-08 DIAGNOSIS — K219 Gastro-esophageal reflux disease without esophagitis: Secondary | ICD-10-CM | POA: Diagnosis not present

## 2019-12-19 DIAGNOSIS — Z96642 Presence of left artificial hip joint: Secondary | ICD-10-CM | POA: Diagnosis not present

## 2019-12-19 DIAGNOSIS — M25561 Pain in right knee: Secondary | ICD-10-CM | POA: Diagnosis not present

## 2019-12-19 DIAGNOSIS — Z96651 Presence of right artificial knee joint: Secondary | ICD-10-CM | POA: Diagnosis not present

## 2019-12-19 DIAGNOSIS — M25552 Pain in left hip: Secondary | ICD-10-CM | POA: Diagnosis not present

## 2019-12-19 DIAGNOSIS — M545 Low back pain: Secondary | ICD-10-CM | POA: Diagnosis not present

## 2020-01-28 DIAGNOSIS — R1013 Epigastric pain: Secondary | ICD-10-CM | POA: Diagnosis not present

## 2020-01-28 DIAGNOSIS — I700 Atherosclerosis of aorta: Secondary | ICD-10-CM | POA: Diagnosis not present

## 2020-01-28 DIAGNOSIS — R110 Nausea: Secondary | ICD-10-CM | POA: Diagnosis not present

## 2020-01-28 DIAGNOSIS — I350 Nonrheumatic aortic (valve) stenosis: Secondary | ICD-10-CM | POA: Diagnosis not present

## 2020-01-28 DIAGNOSIS — Z7901 Long term (current) use of anticoagulants: Secondary | ICD-10-CM | POA: Diagnosis not present

## 2020-01-28 DIAGNOSIS — R2689 Other abnormalities of gait and mobility: Secondary | ICD-10-CM | POA: Diagnosis not present

## 2020-01-28 DIAGNOSIS — F1320 Sedative, hypnotic or anxiolytic dependence, uncomplicated: Secondary | ICD-10-CM | POA: Diagnosis not present

## 2020-01-28 DIAGNOSIS — I10 Essential (primary) hypertension: Secondary | ICD-10-CM | POA: Diagnosis not present

## 2020-01-28 DIAGNOSIS — K219 Gastro-esophageal reflux disease without esophagitis: Secondary | ICD-10-CM | POA: Diagnosis not present

## 2020-01-28 DIAGNOSIS — E039 Hypothyroidism, unspecified: Secondary | ICD-10-CM | POA: Diagnosis not present

## 2020-01-28 DIAGNOSIS — F1120 Opioid dependence, uncomplicated: Secondary | ICD-10-CM | POA: Diagnosis not present

## 2020-01-30 DIAGNOSIS — E038 Other specified hypothyroidism: Secondary | ICD-10-CM | POA: Diagnosis not present

## 2020-01-30 DIAGNOSIS — I10 Essential (primary) hypertension: Secondary | ICD-10-CM | POA: Diagnosis not present

## 2020-02-19 ENCOUNTER — Ambulatory Visit: Payer: PPO | Admitting: Podiatry

## 2020-02-19 ENCOUNTER — Ambulatory Visit (INDEPENDENT_AMBULATORY_CARE_PROVIDER_SITE_OTHER): Payer: PPO

## 2020-02-19 ENCOUNTER — Other Ambulatory Visit: Payer: Self-pay

## 2020-02-19 DIAGNOSIS — M7732 Calcaneal spur, left foot: Secondary | ICD-10-CM

## 2020-02-19 DIAGNOSIS — M79672 Pain in left foot: Secondary | ICD-10-CM

## 2020-02-19 DIAGNOSIS — M778 Other enthesopathies, not elsewhere classified: Secondary | ICD-10-CM | POA: Diagnosis not present

## 2020-02-19 DIAGNOSIS — M7662 Achilles tendinitis, left leg: Secondary | ICD-10-CM | POA: Diagnosis not present

## 2020-02-19 DIAGNOSIS — M19079 Primary osteoarthritis, unspecified ankle and foot: Secondary | ICD-10-CM | POA: Diagnosis not present

## 2020-02-19 DIAGNOSIS — M779 Enthesopathy, unspecified: Secondary | ICD-10-CM

## 2020-02-19 NOTE — Patient Instructions (Signed)

## 2020-02-22 ENCOUNTER — Other Ambulatory Visit: Payer: Self-pay | Admitting: Podiatry

## 2020-02-22 DIAGNOSIS — M7662 Achilles tendinitis, left leg: Secondary | ICD-10-CM

## 2020-02-25 NOTE — Progress Notes (Signed)
Subjective:   Patient ID: [name], female   DOB: 75 y.o.   MRN: [id number]   HPI 74 year old female presents the office today for concerns of left posterior heel pain as well as some generalized foot and ankle discomfort which has been ongoing for last 6 months. Ankle swelling is been ongoing for years however the posterior heel pain is been ongoing for about 6 months. She denies any recent injury she said no recent treatment. She been having numbness to the foot as well which is been ongoing for months. She follows with Dr. Danielle Dess with neurosurgery. She is on gabapentin currently.   Review of Systems  All other systems reviewed and are negative.  Past Medical History:  Diagnosis Date  . Anemia   . Arthritis    osteoarthritis  . Complication of anesthesia    hard time waking up after her gallbladder surgery 28 years ago  . GERD (gastroesophageal reflux disease)    hx of  . Heart murmur   . History of blood transfusion   . History of hiatal hernia    small  . History of kidney stones   . Hypercholesteremia    under control  . Hypertension   . Hypothyroidism   . Mild aortic stenosis    a. by echo 12/2017.  Marland Kitchen PONV (postoperative nausea and vomiting)   . Pulmonary embolism (HCC)    bilateral lungs - 2008 after disc surgery    Past Surgical History:  Procedure Laterality Date  . ABDOMINAL HYSTERECTOMY  1975   partial  . APPENDECTOMY  1973  . BACK SURGERY     X14 1987-2009  . CARDIAC CATHETERIZATION  ~2010  . CARPAL TUNNEL RELEASE Bilateral   . CHOLECYSTECTOMY  1997  . COLONOSCOPY    . COLONOSCOPY WITH PROPOFOL N/A 12/05/2017   Procedure: COLONOSCOPY WITH PROPOFOL;  Surgeon: Carman Ching, MD;  Location: [facility];  Service: Endoscopy;  Laterality: N/A;  . DILATION AND CURETTAGE OF UTERUS    . ENDARTERECTOMY Right 08/10/2013   Procedure: Excision of Venous Aneurysm Right Neck;  Surgeon: Larina Earthly, MD;  Location: [facility] OR;  Service: Vascular;  Laterality: Right;   . ESOPHAGOGASTRODUODENOSCOPY (EGD) WITH PROPOFOL N/A 12/05/2017   Procedure: ESOPHAGOGASTRODUODENOSCOPY (EGD) WITH PROPOFOL;  Surgeon: Carman Ching, MD;  Location: [facility];  Service: Endoscopy;  Laterality: N/A;  . filter Left 2009   IVC  . HIP SURGERY Left 2008  . JOINT REPLACEMENT Right 11/2003   (knee)multiple surgeries  . JOINT REPLACEMENT Left 12/2009   (knee)  . JOINT REPLACEMENT Left 2011, 2012   Hip  . MASS EXCISION  1974   uterus  . QUADRICEPS TENDON REPAIR Right 12/06/2016   Procedure: QUAD TENDON RECONSTRUCTION;  Surgeon: Durene Romans, MD;  Location: [facility];  Service: Orthopedics;  Laterality: Right;  Requests for 90 mins  . TONSILLECTOMY  1986  . TOTAL HIP REVISION Left 12/07/2013   Procedure:  REVISION  CONSTRAINED LINER LEFT TOTAL HIP ;  Surgeon: Shelda Pal, MD;  Location: [facility];  Service: Orthopedics;  Laterality: Left;  . TOTAL SHOULDER REPLACEMENT Right 11/30/2010  . TUBAL LIGATION  1973     Current Outpatient Medications:  .  ALPRAZolam (XANAX) 0.5 MG tablet, Take 0.5 mg by mouth 2 (two) times daily. , Disp: , Rfl:  .  amLODipine (NORVASC) 2.5 MG tablet, Take by mouth., Disp: , Rfl:  .  amLODipine (NORVASC) 5 MG tablet, SMARTSIG:1 Tablet(s) By Mouth Every Evening, Disp: , Rfl:  .  atenolol (TENORMIN) 25 MG tablet, Take 25 mg by mouth See admin instructions. Take one tablet (25 mg) by mouth every morning, may also take a 2nd tablet (25 mg) if SBP >150, Disp: , Rfl:  .  baclofen (LIORESAL) 10 MG tablet, Take 1 tablet (10 mg total) by mouth every 8 (eight) hours., Disp: 30 each, Rfl: 0 .  CINNAMON PO, Take 1,500 mg by mouth 2 (two) times daily. , Disp: , Rfl:  .  docusate sodium (COLACE) 250 MG capsule, Take 250 mg by mouth at bedtime. , Disp: , Rfl:  .  escitalopram (LEXAPRO) 10 MG tablet, Take 10 mg by mouth at bedtime., Disp: , Rfl:  .  famotidine (PEPCID) 40 MG tablet, Take 40 mg by mouth at bedtime., Disp: , Rfl: 3 .  fluticasone (FLONASE) 50 MCG/ACT  nasal spray, SPRAY 2 SPRAYS INTO EACH NOSTRIL EVERY DAY, Disp: , Rfl:  .  furosemide (LASIX) 20 MG tablet, Take 20 mg by mouth See admin instructions. Take one tablet (20 mg) by mouth - twice daily - morning and 3pm, Disp: , Rfl:  .  gabapentin (NEURONTIN) 600 MG tablet, Take 600 mg by mouth 3 (three) times daily. , Disp: , Rfl:  .  HYDROcodone-acetaminophen (NORCO/VICODIN) 5-325 MG tablet, Take 1 tablet by mouth 3 (three) times daily. , Disp: , Rfl:  .  levothyroxine (SYNTHROID, LEVOTHROID) 50 MCG tablet, Take 25 mcg by mouth every Monday, Wednesday, and Friday., Disp: , Rfl: 1 .  losartan (COZAAR) 25 MG tablet, Take 25 mg by mouth at bedtime. , Disp: , Rfl:  .  losartan (COZAAR) 50 MG tablet, Take 50 mg by mouth 2 (two) times daily., Disp: , Rfl:  .  ofloxacin (OCUFLOX) 0.3 % ophthalmic solution, Instill one drop OD QID x 1 week, Disp: , Rfl:  .  ondansetron (ZOFRAN-ODT) 4 MG disintegrating tablet, Take 4 mg by mouth 2 (two) times daily as needed., Disp: , Rfl: 3 .  OVER THE COUNTER MEDICATION, Take 2 tablets by mouth daily. Multivitamin plus Omega 3 supplement, Disp: , Rfl:  .  OVER THE COUNTER MEDICATION, Apply 1 application topically 3 (three) times daily as needed (joint pain). [place] Dream Cream- Muscle Spasms , Disp: , Rfl:  .  pantoprazole (PROTONIX) 40 MG tablet, Take 1 tablet (40 mg total) by mouth 2 (two) times daily., Disp: 60 tablet, Rfl: 0 .  Polyethyl Glycol-Propyl Glycol (SYSTANE OP), Place 1-2 drops into both eyes at bedtime as needed (for dry eyes)., Disp: , Rfl:  .  Probiotic Product (PROBIOTIC PO), Take 1 capsule by mouth daily., Disp: , Rfl:  .  Rivaroxaban (XARELTO) 20 MG TABS tablet, Take 20 mg by mouth daily at 3 pm. , Disp: , Rfl:  .  simvastatin (ZOCOR) 20 MG tablet, Take 20 mg by mouth at bedtime. , Disp: , Rfl:  .  vitamin B-12 (CYANOCOBALAMIN) 500 MCG tablet, Take 500 mcg by mouth daily., Disp: , Rfl:  .  metoCLOPramide (REGLAN) 5 MG tablet, Take 5 mg by mouth 2  (two) times daily. , Disp: , Rfl:   Allergies  Allergen Reactions  . Celecoxib Rash         Objective:  Physical Exam  General: AAO x3, NAD  Dermatological: Skin is warm, dry and supple bilateral. Nails x 10 are well manicured; remaining integument appears unremarkable at this time. There are no open sores, no preulcerative lesions, no rash or signs of infection present.  Vascular: Dorsalis Pedis artery and Posterior Tibial  artery pedal pulses are 2/4 bilateral with immedate capillary fill time. There is no pain with calf compression, swelling, warmth, erythema.   Neruologic: Sensation decreased with Semmes Weinstein monofilament  Musculoskeletal: Tenderness palpation of the posterior aspect of the calcaneus insertion of the Achilles tendon as well as a small spur that is palpable. There is minimal edema today. There is no erythema. Negative Tinel sign. There is negative Thompson test. Achilles tendon appears to be intact. Mild discomfort in the dorsal aspect of the midfoot without any specific area of pinpoint tenderness.  Gait: Unassisted, Nonantalgic.       Assessment:   Calcaneal spurring with insertional Achilles tendinitis; arthritis    Plan:  -Treatment options discussed including all alternatives, risks, and complications -Etiology of symptoms were discussed -X-rays were obtained and reviewed with the patient. Calcaneal spurring is present. There is no evidence of acute fracture. Midfoot arthritic changes are noted. -Heel lift was dispensed. Discussed stretching, icing daily. She can use Voltaren gel as well. -Continue gabapentin. She is following up with neurosurgery. She does seem to have some more numbness to her foot she reports her last couple of months.  Return in about 6 weeks (around 04/01/2020).  Vivi Barrack DPM

## 2020-03-18 DIAGNOSIS — K219 Gastro-esophageal reflux disease without esophagitis: Secondary | ICD-10-CM | POA: Diagnosis not present

## 2020-03-18 DIAGNOSIS — K3184 Gastroparesis: Secondary | ICD-10-CM | POA: Diagnosis not present

## 2020-03-18 DIAGNOSIS — Z1211 Encounter for screening for malignant neoplasm of colon: Secondary | ICD-10-CM | POA: Diagnosis not present

## 2020-04-01 ENCOUNTER — Ambulatory Visit: Payer: PPO | Admitting: Podiatry

## 2020-05-13 DIAGNOSIS — Z1231 Encounter for screening mammogram for malignant neoplasm of breast: Secondary | ICD-10-CM | POA: Diagnosis not present

## 2020-07-15 DIAGNOSIS — I10 Essential (primary) hypertension: Secondary | ICD-10-CM | POA: Diagnosis not present

## 2020-07-15 DIAGNOSIS — E039 Hypothyroidism, unspecified: Secondary | ICD-10-CM | POA: Diagnosis not present

## 2020-07-15 DIAGNOSIS — M859 Disorder of bone density and structure, unspecified: Secondary | ICD-10-CM | POA: Diagnosis not present

## 2020-07-15 DIAGNOSIS — E781 Pure hyperglyceridemia: Secondary | ICD-10-CM | POA: Diagnosis not present

## 2020-07-22 DIAGNOSIS — I10 Essential (primary) hypertension: Secondary | ICD-10-CM | POA: Diagnosis not present

## 2020-07-22 DIAGNOSIS — N1832 Chronic kidney disease, stage 3b: Secondary | ICD-10-CM | POA: Diagnosis not present

## 2020-07-22 DIAGNOSIS — Z7901 Long term (current) use of anticoagulants: Secondary | ICD-10-CM | POA: Diagnosis not present

## 2020-07-22 DIAGNOSIS — I129 Hypertensive chronic kidney disease with stage 1 through stage 4 chronic kidney disease, or unspecified chronic kidney disease: Secondary | ICD-10-CM | POA: Diagnosis not present

## 2020-07-22 DIAGNOSIS — Z1331 Encounter for screening for depression: Secondary | ICD-10-CM | POA: Diagnosis not present

## 2020-07-22 DIAGNOSIS — Z86711 Personal history of pulmonary embolism: Secondary | ICD-10-CM | POA: Diagnosis not present

## 2020-07-22 DIAGNOSIS — H524 Presbyopia: Secondary | ICD-10-CM | POA: Diagnosis not present

## 2020-07-22 DIAGNOSIS — H25011 Cortical age-related cataract, right eye: Secondary | ICD-10-CM | POA: Diagnosis not present

## 2020-07-22 DIAGNOSIS — I700 Atherosclerosis of aorta: Secondary | ICD-10-CM | POA: Diagnosis not present

## 2020-07-22 DIAGNOSIS — H5203 Hypermetropia, bilateral: Secondary | ICD-10-CM | POA: Diagnosis not present

## 2020-07-22 DIAGNOSIS — I517 Cardiomegaly: Secondary | ICD-10-CM | POA: Diagnosis not present

## 2020-07-22 DIAGNOSIS — G609 Hereditary and idiopathic neuropathy, unspecified: Secondary | ICD-10-CM | POA: Diagnosis not present

## 2020-07-22 DIAGNOSIS — Z23 Encounter for immunization: Secondary | ICD-10-CM | POA: Diagnosis not present

## 2020-07-22 DIAGNOSIS — D649 Anemia, unspecified: Secondary | ICD-10-CM | POA: Diagnosis not present

## 2020-07-22 DIAGNOSIS — G894 Chronic pain syndrome: Secondary | ICD-10-CM | POA: Diagnosis not present

## 2020-07-22 DIAGNOSIS — Z961 Presence of intraocular lens: Secondary | ICD-10-CM | POA: Diagnosis not present

## 2020-07-22 DIAGNOSIS — I350 Nonrheumatic aortic (valve) stenosis: Secondary | ICD-10-CM | POA: Diagnosis not present

## 2020-07-22 DIAGNOSIS — Z0000 Encounter for general adult medical examination without abnormal findings: Secondary | ICD-10-CM | POA: Diagnosis not present

## 2020-07-22 DIAGNOSIS — H2511 Age-related nuclear cataract, right eye: Secondary | ICD-10-CM | POA: Diagnosis not present

## 2020-07-22 DIAGNOSIS — H31012 Macula scars of posterior pole (postinflammatory) (post-traumatic), left eye: Secondary | ICD-10-CM | POA: Diagnosis not present

## 2020-07-28 DIAGNOSIS — Z6841 Body Mass Index (BMI) 40.0 and over, adult: Secondary | ICD-10-CM | POA: Diagnosis not present

## 2020-07-28 DIAGNOSIS — Z01419 Encounter for gynecological examination (general) (routine) without abnormal findings: Secondary | ICD-10-CM | POA: Diagnosis not present

## 2020-08-11 DIAGNOSIS — R059 Cough, unspecified: Secondary | ICD-10-CM | POA: Diagnosis not present

## 2020-08-11 DIAGNOSIS — J069 Acute upper respiratory infection, unspecified: Secondary | ICD-10-CM | POA: Diagnosis not present

## 2020-08-11 DIAGNOSIS — I10 Essential (primary) hypertension: Secondary | ICD-10-CM | POA: Diagnosis not present

## 2020-08-11 DIAGNOSIS — Z1152 Encounter for screening for COVID-19: Secondary | ICD-10-CM | POA: Diagnosis not present

## 2020-08-13 DIAGNOSIS — I10 Essential (primary) hypertension: Secondary | ICD-10-CM | POA: Diagnosis not present

## 2020-11-17 DIAGNOSIS — M546 Pain in thoracic spine: Secondary | ICD-10-CM | POA: Diagnosis not present

## 2020-11-17 DIAGNOSIS — M5459 Other low back pain: Secondary | ICD-10-CM | POA: Diagnosis not present

## 2020-11-17 DIAGNOSIS — R0781 Pleurodynia: Secondary | ICD-10-CM | POA: Diagnosis not present

## 2020-12-08 ENCOUNTER — Telehealth: Payer: Self-pay | Admitting: Podiatry

## 2020-12-08 NOTE — Telephone Encounter (Signed)
Patient has requested copy of Xrays that were previously taken, Please Advise

## 2020-12-08 NOTE — Telephone Encounter (Signed)
Ok to do

## 2020-12-10 NOTE — Telephone Encounter (Signed)
See attached message from [place], Thanks

## 2020-12-26 DIAGNOSIS — M546 Pain in thoracic spine: Secondary | ICD-10-CM | POA: Diagnosis not present

## 2020-12-30 ENCOUNTER — Other Ambulatory Visit (HOSPITAL_COMMUNITY): Payer: Self-pay | Admitting: Neurological Surgery

## 2020-12-30 DIAGNOSIS — M546 Pain in thoracic spine: Secondary | ICD-10-CM

## 2021-01-17 ENCOUNTER — Ambulatory Visit (HOSPITAL_COMMUNITY)
Admission: RE | Admit: 2021-01-17 | Discharge: 2021-01-17 | Disposition: A | Discharge status: Home / Self Care | Payer: PPO | Source: Ambulatory Visit | Attending: Internal Medicine | Admitting: Internal Medicine

## 2021-01-17 DIAGNOSIS — Z20822 Contact with and (suspected) exposure to covid-19: Secondary | ICD-10-CM | POA: Diagnosis not present

## 2021-01-17 DIAGNOSIS — Z01812 Encounter for preprocedural laboratory examination: Secondary | ICD-10-CM | POA: Diagnosis not present

## 2021-01-17 LAB — SARS CORONAVIRUS 2 (TAT 6-24 HRS): SARS Coronavirus 2: NEGATIVE

## 2021-01-19 ENCOUNTER — Encounter (HOSPITAL_COMMUNITY): Payer: Self-pay | Admitting: *Deleted

## 2021-01-19 NOTE — Progress Notes (Signed)
[name] denies chest pain or shortness of breath. Patient was tested for Covid and has been in quarantine since that time.

## 2021-01-19 NOTE — Progress Notes (Signed)
Anesthesia Chart Review: [name]    Case: [id number] Date/Time: 01/20/21 0945   Procedure: MRI WITH [facility] PAIN WITHOUT CONTRAST (N/A )   Anesthesia type: General   Pre-op diagnosis: THORIAC PAIN   Location: [facility] RADIOLOGY ROOM / [facility] OR   Surgeons: Radiologist, Medication, MD      DISCUSSION: Patient is a 75 year old female scheduled for MRI of the T-spine, ordered by Barnett Abu, MD. H&P form is within 30 days (scanned under Media tab).    History includes non-smoker, DVT/PE (LLE DVT; post-op RUL/RLL PE 12/11/06; IVC filter 12/11/06), venous aneurysm right IJ (s/p resection 08/10/13), PAD, heart murmur (trivial MR 04/2013 echo; mild AS 12/2017 echo), hypothyroidism, anemia, claustrophobia, panic attacks, obesity, TKA (left 12/30/09, right TKA 10/22/04, revision 08/16/06), THA (left THA revision 05/05/10, 02/16/11), right reverse total shoulder (11/30/10), back surgery (L3-4 PLIF/posterolateral arthrodesis 10/29/02; T10-L2 arthrodesis 12/01/06, complicated by epidural hematoma s/p decompression left L2 12/06/06), neck surgery (C3-4 ACDF 08/22/08). 2019 Coronary CT showed mild CAD proximal LAD and distal RCA with no significant stenosis by FFR.    For anesthesia history she reported post-operative N/V and hard time waking up from cholecystectomy > 25 years ago.   Mild AS and no significant CAD by testing in 2019. As needed cardiology follow-up recommended by Dr. Katrinka Blazing in 2020. Last EKG seen is from 2019. She is on Xarelto (DVT/PE history).  She denied chest pain or shortness of breath per PAT RN phone interview.  01/17/21 preprocedure COVID-19 test negative.  Anesthesia team to evaluate on the day of surgery.  She is for labs and EKG as indicated.    VS: Ht 5\' 3"  (1.6 m)   Wt 117.9 kg   BMI 46.06 kg/m  BP Readings from Last 3 Encounters:  12/12/18 134/78  09/01/18 (!) 158/75  08/19/18 (!) 144/68   Pulse Readings from Last 3 Encounters:  12/12/18 63  09/01/18 (!) 56  08/19/18 (!) 54     PROVIDERS: 13/02/19, MD is PCP  Creola Corn, MD is primary cardiologist. Last cardiology visit 12/12/18 with PRN follow-up recommended at that time.     LABS: For day of procedure as indicated.    EKG: Last EKG seen is from 08/19/18: SB at 58 bpm, LVH, early repolarization   CV: Coronary CT 08/19/18: IMPRESSION: 1. The patient's coronary artery calcium score is 135, which places the patient in the 72 percentile. This is greater than expected for age and gender matched peers. 2. Normal coronary origin with right dominance. 3. Mild CAD, CADRADS = 2. Mild stenosis of the Proximal LAD and the distal RCA.  FFR 08/19/18: 1. Left Main:  No significant stenosis. FFR =0.98 2. LAD: No significant stenosis. Proximal FFR = 0.97, Mid FFR = 0.97, Distal FFR = 0.91 3. LCX: No significant stenosis. Proximal FFR = 0.95, Distal FFR = 0.90 4. RCA: No significant stenosis. Proximal FFR =0.99, Mid FFR = 0.96, Distal FFR = 0.95, PDA FFR = 0.90, PL proximal FFR = 0.94, PL distal FFR = 0.76 IMPRESSION: 1. CT FFR analysis showed no significant proximal stenosis. Distal posterolateral branch FFR = 0.76.   Echo 01/12/18: Study Conclusions  - Left ventricle: The cavity size was normal. Wall thickness was  normal. Systolic function was normal. The estimated ejection  fraction was in the range of 55% to 60%.  - Aortic valve: AV is difficult to see well It is mildly thickened.  Peak and mean gradients through the valve are 24 and 11  mm Hg  respectively consistent with mild AS. Valve area (VTI): 1.47 cm^2.   Nuclear stress test 08/05/16:  The left ventricular ejection fraction is normal (55-65%).  Nuclear stress EF: 63%.  There was no ST segment deviation noted during stress.  Defect 1: There is a large defect of moderate severity present in the basal inferior, mid inferior, mid inferolateral, apical inferior, apical lateral and apex location. This defect is likely due to artifact  / chest wall attenuation.  The study is normal.  This is a low risk study.   Cardiac cath on 01/17/08 showed:  1. Essentially normal coronaries. A minimal insignificant obstruction (20%) is present in the mid-portion of the right coronary.  EF 65-70%. 2. Systemic hypertension.  3. Elevated left ventricular end-diastolic pressure.  4. Minimal atherosclerotic disease of both renal arteries, but each is widely patent.  5. Non-cardiac chest pain.   Past Medical History:  Diagnosis Date  . Anemia   . Arthritis    osteoarthritis  . Claustrophobia   . Complication of anesthesia    hard time waking up after her gallbladder surgery 28 years ago  . DVT (deep venous thrombosis) (HCC)    left leg  . GERD (gastroesophageal reflux disease)    hx of  . Heart murmur    Dr. Katrinka Blazing follows, 'nothing to worry about.'  . History of blood transfusion   . History of hiatal hernia    small  . History of kidney stones   . Hypercholesteremia    under control  . Hypertension   . Hypothyroidism   . Mild aortic stenosis    a. by echo 12/2017.  Marland Kitchen Panic attack   . PONV (postoperative nausea and vomiting)   . Pulmonary embolism (HCC)    bilateral lungs - 2008 after disc surgery    Past Surgical History:  Procedure Laterality Date  . ABDOMINAL HYSTERECTOMY  1975   partial  . APPENDECTOMY  1973  . BACK SURGERY     X14 1987-2009  . CARDIAC CATHETERIZATION  ~2010  . CARPAL TUNNEL RELEASE Bilateral   . CHOLECYSTECTOMY  1997  . COLONOSCOPY    . COLONOSCOPY WITH PROPOFOL N/A 12/05/2017   Procedure: COLONOSCOPY WITH PROPOFOL;  Surgeon: Carman Ching, MD;  Location: [facility] ENDOSCOPY;  Service: Endoscopy;  Laterality: N/A;  . DILATION AND CURETTAGE OF UTERUS    . ENDARTERECTOMY Right 08/10/2013   Procedure: Excision of Venous Aneurysm Right Neck;  Surgeon: Larina Earthly, MD;  Location: [facility] OR;  Service: Vascular;  Laterality: Right;  . ESOPHAGOGASTRODUODENOSCOPY (EGD) WITH PROPOFOL N/A 12/05/2017    Procedure: ESOPHAGOGASTRODUODENOSCOPY (EGD) WITH PROPOFOL;  Surgeon: Carman Ching, MD;  Location: [facility] ENDOSCOPY;  Service: Endoscopy;  Laterality: N/A;  . filter Left 2009   IVC  . HIP SURGERY Left 2008  . JOINT REPLACEMENT Right 11/2003   (knee)multiple surgeries  . JOINT REPLACEMENT Left 12/2009   (knee)  . JOINT REPLACEMENT Left 2011, 2012   Hip  . MASS EXCISION  1974   uterus  . QUADRICEPS TENDON REPAIR Right 12/06/2016   Procedure: QUAD TENDON RECONSTRUCTION;  Surgeon: Durene Romans, MD;  Location: [facility] ORS;  Service: Orthopedics;  Laterality: Right;  Requests for 90 mins  . TONSILLECTOMY  1986  . TOTAL HIP REVISION Left 12/07/2013   Procedure:  REVISION  CONSTRAINED LINER LEFT TOTAL HIP ;  Surgeon: Shelda Pal, MD;  Location: [facility] ORS;  Service: Orthopedics;  Laterality: Left;  . TOTAL SHOULDER REPLACEMENT Right 11/30/2010  .  TUBAL LIGATION  1973    MEDICATIONS: No current facility-administered medications for this encounter.   Marland Kitchen ALPRAZolam (XANAX) 0.5 MG tablet  . amLODipine (NORVASC) 5 MG tablet  . atenolol (TENORMIN) 25 MG tablet  . baclofen (LIORESAL) 10 MG tablet  . escitalopram (LEXAPRO) 5 MG tablet  . famotidine (PEPCID) 40 MG tablet  . fluticasone (FLONASE) 50 MCG/ACT nasal spray  . furosemide (LASIX) 20 MG tablet  . gabapentin (NEURONTIN) 600 MG tablet  . HYDROcodone-acetaminophen (NORCO/VICODIN) 5-325 MG tablet  . levothyroxine (SYNTHROID, LEVOTHROID) 50 MCG tablet  . losartan (COZAAR) 50 MG tablet  . ondansetron (ZOFRAN-ODT) 4 MG disintegrating tablet  . OVER THE COUNTER MEDICATION  . OVER THE COUNTER MEDICATION  . Polyethyl Glycol-Propyl Glycol (SYSTANE OP)  . Probiotic Product (PROBIOTIC PO)  . Rivaroxaban (XARELTO) 20 MG TABS tablet  . simvastatin (ZOCOR) 20 MG tablet    Shonna Chock, PA-C Surgical Short Stay/Anesthesiology [facility] Phone [phone] [facility] Phone [phone] 01/19/2021 3:53 PM

## 2021-01-19 NOTE — Anesthesia Preprocedure Evaluation (Addendum)
Anesthesia Evaluation  Patient identified by MRN, date of birth, ID band Patient awake    Reviewed: Allergy & Precautions, H&P , NPO status , Patient's Chart, lab work & pertinent test results  History of Anesthesia Complications (+) PONV, PROLONGED EMERGENCE and history of anesthetic complications  Airway Mallampati: I  TM Distance: >3 FB Neck ROM: Full    Dental no notable dental hx. (+) Teeth Intact, Dental Advisory Given, Caps   Pulmonary neg pulmonary ROS,    Pulmonary exam normal breath sounds clear to auscultation       Cardiovascular Exercise Tolerance: Good hypertension, Pt. on medications negative cardio ROS Normal cardiovascular exam+ Valvular Problems/Murmurs AS  Rhythm:Regular Rate:Normal     Neuro/Psych PSYCHIATRIC DISORDERS Anxiety negative neurological ROS  negative psych ROS   GI/Hepatic negative GI ROS, Neg liver ROS, hiatal hernia, GERD  Medicated,  Endo/Other  negative endocrine ROSHypothyroidism   Renal/GU Renal diseasenegative Renal ROS  negative genitourinary   Musculoskeletal negative musculoskeletal ROS (+) Arthritis , Osteoarthritis,    Abdominal   Peds negative pediatric ROS (+)  Hematology negative hematology ROS (+) Blood dyscrasia, anemia ,   Anesthesia Other Findings   Reproductive/Obstetrics negative OB ROS                           Anesthesia Physical Anesthesia Plan  ASA: III  Anesthesia Plan: General   Post-op Pain Management:    Induction: Intravenous  PONV Risk Score and Plan: 3 and Ondansetron, Dexamethasone, Treatment may vary due to age or medical condition and TIVA  Airway Management Planned: Oral ETT and LMA  Additional Equipment: None  Intra-op Plan:   Post-operative Plan: Extubation in OR  Informed Consent: I have reviewed the patients History and Physical, chart, labs and discussed the procedure including the risks, benefits and  alternatives for the proposed anesthesia with the patient or authorized representative who has indicated his/her understanding and acceptance.       Plan Discussed with: Anesthesiologist and CRNA  Anesthesia Plan Comments: (PAT note written 01/19/2021 by Shonna Chock, PA-C. EKG: Last EKG seen is from 08/19/18: SB at 58 bpm, LVH, early repolarization   CV: Coronary CT 08/19/18: IMPRESSION: 1. The patient's coronary artery calcium score is 135, which places the patient in the 72 percentile. This is greater than expected for age and gender matched peers. 2. Normal coronary origin with right dominance. 3. Mild CAD, CADRADS = 2. Mild stenosis of the Proximal LAD and the distal RCA.  FFR 08/19/18: 1. Left Main: No significant stenosis. FFR =0.98 2. LAD: No significant stenosis. Proximal FFR = 0.97, Mid FFR = 0.97, Distal FFR = 0.91 3. LCX: No significant stenosis. Proximal FFR = 0.95, Distal FFR = 0.90 4. RCA: No significant stenosis. Proximal FFR =0.99, Mid FFR = 0.96, Distal FFR = 0.95, PDA FFR = 0.90, PL proximal FFR = 0.94, PL distal FFR = 0.76 IMPRESSION: 1. CT FFR analysis showed no significant proximal stenosis. Distal posterolateral branch FFR = 0.76.  Echo 01/12/18: - Left ventricle: The cavity size was normal. Wall thickness was  normal. Systolic function was normal. The estimated ejection  fraction was in the range of 55% to 60%.  - Aortic valve: AV is difficult to see well It is mildly thickened.  Peak and mean gradients through the valve are 24 and 11 mm Hg  respectively consistent with mild AS. Valve area (VTI): 1.47 cm^2.  Nuclear stress test 08/05/16:  The left ventricular  ejection fraction is normal (55-65%).  Nuclear stress EF: 63%.  There was no ST segment deviation noted during stress.  Defect 1: There is a large defect of moderate severity present in the basal inferior, mid inferior, mid inferolateral, apical inferior, apical lateral and  apex location. This defect is likely due to artifact / chest wall attenuation.  The study is normal.  This is a low risk study.    )       Anesthesia Quick Evaluation

## 2021-01-20 ENCOUNTER — Ambulatory Visit (HOSPITAL_COMMUNITY)
Admission: RE | Admit: 2021-01-20 | Discharge: 2021-01-20 | Disposition: A | Discharge status: Home / Self Care | Payer: PPO | Source: Ambulatory Visit | Attending: Neurological Surgery | Admitting: Neurological Surgery

## 2021-01-20 ENCOUNTER — Encounter (HOSPITAL_COMMUNITY): Payer: Self-pay | Admitting: Neurological Surgery

## 2021-01-20 ENCOUNTER — Other Ambulatory Visit: Payer: Self-pay

## 2021-01-20 ENCOUNTER — Ambulatory Visit (HOSPITAL_COMMUNITY): Payer: PPO | Admitting: Vascular Surgery

## 2021-01-20 ENCOUNTER — Ambulatory Visit (HOSPITAL_COMMUNITY)
Admission: RE | Admit: 2021-01-20 | Discharge: 2021-01-20 | Disposition: A | Discharge status: Home / Self Care | Payer: PPO | Attending: Neurological Surgery | Admitting: Neurological Surgery

## 2021-01-20 ENCOUNTER — Encounter (HOSPITAL_COMMUNITY)
Admission: RE | Disposition: A | Discharge status: Home / Self Care | Payer: Self-pay | Source: Home / Self Care | Attending: Neurological Surgery

## 2021-01-20 DIAGNOSIS — Z96642 Presence of left artificial hip joint: Secondary | ICD-10-CM | POA: Insufficient documentation

## 2021-01-20 DIAGNOSIS — N1832 Chronic kidney disease, stage 3b: Secondary | ICD-10-CM | POA: Diagnosis not present

## 2021-01-20 DIAGNOSIS — M4802 Spinal stenosis, cervical region: Secondary | ICD-10-CM | POA: Diagnosis not present

## 2021-01-20 DIAGNOSIS — M546 Pain in thoracic spine: Secondary | ICD-10-CM | POA: Insufficient documentation

## 2021-01-20 DIAGNOSIS — Z86718 Personal history of other venous thrombosis and embolism: Secondary | ICD-10-CM | POA: Insufficient documentation

## 2021-01-20 DIAGNOSIS — Z96653 Presence of artificial knee joint, bilateral: Secondary | ICD-10-CM | POA: Diagnosis not present

## 2021-01-20 DIAGNOSIS — Z79899 Other long term (current) drug therapy: Secondary | ICD-10-CM | POA: Insufficient documentation

## 2021-01-20 DIAGNOSIS — F1120 Opioid dependence, uncomplicated: Secondary | ICD-10-CM | POA: Diagnosis not present

## 2021-01-20 DIAGNOSIS — E039 Hypothyroidism, unspecified: Secondary | ICD-10-CM | POA: Insufficient documentation

## 2021-01-20 DIAGNOSIS — Z9889 Other specified postprocedural states: Secondary | ICD-10-CM | POA: Diagnosis not present

## 2021-01-20 DIAGNOSIS — S22068A Other fracture of T7-T8 thoracic vertebra, initial encounter for closed fracture: Secondary | ICD-10-CM | POA: Diagnosis not present

## 2021-01-20 DIAGNOSIS — I700 Atherosclerosis of aorta: Secondary | ICD-10-CM | POA: Diagnosis not present

## 2021-01-20 DIAGNOSIS — Z86711 Personal history of pulmonary embolism: Secondary | ICD-10-CM | POA: Insufficient documentation

## 2021-01-20 DIAGNOSIS — M4804 Spinal stenosis, thoracic region: Secondary | ICD-10-CM | POA: Diagnosis not present

## 2021-01-20 DIAGNOSIS — F410 Panic disorder [episodic paroxysmal anxiety] without agoraphobia: Secondary | ICD-10-CM | POA: Diagnosis not present

## 2021-01-20 DIAGNOSIS — M4314 Spondylolisthesis, thoracic region: Secondary | ICD-10-CM | POA: Insufficient documentation

## 2021-01-20 DIAGNOSIS — E781 Pure hyperglyceridemia: Secondary | ICD-10-CM | POA: Diagnosis not present

## 2021-01-20 DIAGNOSIS — Z7901 Long term (current) use of anticoagulants: Secondary | ICD-10-CM | POA: Diagnosis not present

## 2021-01-20 DIAGNOSIS — Z6841 Body Mass Index (BMI) 40.0 and over, adult: Secondary | ICD-10-CM | POA: Insufficient documentation

## 2021-01-20 DIAGNOSIS — I517 Cardiomegaly: Secondary | ICD-10-CM | POA: Diagnosis not present

## 2021-01-20 DIAGNOSIS — E042 Nontoxic multinodular goiter: Secondary | ICD-10-CM | POA: Diagnosis not present

## 2021-01-20 DIAGNOSIS — I350 Nonrheumatic aortic (valve) stenosis: Secondary | ICD-10-CM | POA: Insufficient documentation

## 2021-01-20 DIAGNOSIS — I739 Peripheral vascular disease, unspecified: Secondary | ICD-10-CM | POA: Diagnosis not present

## 2021-01-20 DIAGNOSIS — G894 Chronic pain syndrome: Secondary | ICD-10-CM | POA: Diagnosis not present

## 2021-01-20 DIAGNOSIS — I129 Hypertensive chronic kidney disease with stage 1 through stage 4 chronic kidney disease, or unspecified chronic kidney disease: Secondary | ICD-10-CM | POA: Diagnosis not present

## 2021-01-20 DIAGNOSIS — S22069A Unspecified fracture of T7-T8 vertebra, initial encounter for closed fracture: Secondary | ICD-10-CM | POA: Insufficient documentation

## 2021-01-20 DIAGNOSIS — D649 Anemia, unspecified: Secondary | ICD-10-CM | POA: Diagnosis not present

## 2021-01-20 DIAGNOSIS — Z96611 Presence of right artificial shoulder joint: Secondary | ICD-10-CM | POA: Diagnosis not present

## 2021-01-20 DIAGNOSIS — M25572 Pain in left ankle and joints of left foot: Secondary | ICD-10-CM | POA: Diagnosis not present

## 2021-01-20 DIAGNOSIS — X58XXXA Exposure to other specified factors, initial encounter: Secondary | ICD-10-CM | POA: Insufficient documentation

## 2021-01-20 DIAGNOSIS — M4324 Fusion of spine, thoracic region: Secondary | ICD-10-CM | POA: Insufficient documentation

## 2021-01-20 DIAGNOSIS — E669 Obesity, unspecified: Secondary | ICD-10-CM | POA: Diagnosis not present

## 2021-01-20 HISTORY — PX: RADIOLOGY WITH ANESTHESIA: SHX6223

## 2021-01-20 HISTORY — DX: Panic disorder (episodic paroxysmal anxiety): F41.0

## 2021-01-20 HISTORY — DX: Claustrophobia: F40.240

## 2021-01-20 SURGERY — MRI WITH ANESTHESIA
Anesthesia: General

## 2021-01-20 MED ORDER — PROPOFOL 10 MG/ML IV BOLUS
INTRAVENOUS | Status: DC | PRN
  Administered 2021-01-20: 150 mg via INTRAVENOUS

## 2021-01-20 MED ORDER — ORAL CARE MOUTH RINSE: 15.0000 mL | Freq: Once | OROMUCOSAL | Status: AC

## 2021-01-20 MED ORDER — DEXAMETHASONE SODIUM PHOSPHATE 10 MG/ML IJ SOLN
INTRAMUSCULAR | Status: DC | PRN
  Administered 2021-01-20: 10 mg via INTRAVENOUS

## 2021-01-20 MED ORDER — GLYCOPYRROLATE 0.2 MG/ML IJ SOLN
INTRAMUSCULAR | Status: DC | PRN
  Administered 2021-01-20: .2 mg via INTRAVENOUS

## 2021-01-20 MED ORDER — ONDANSETRON HCL 4 MG/2ML IJ SOLN
INTRAMUSCULAR | Status: DC | PRN
  Administered 2021-01-20: 4 mg via INTRAVENOUS

## 2021-01-20 MED ORDER — LACTATED RINGERS IV SOLN: INTRAVENOUS | Status: DC

## 2021-01-20 MED ORDER — CHLORHEXIDINE GLUCONATE 0.12 % MT SOLN
15.0000 mL | Freq: Once | OROMUCOSAL | Status: AC
  Administered 2021-01-20: 15 mL via OROMUCOSAL
  Filled 2021-01-20: qty 15

## 2021-01-20 MED ORDER — FENTANYL CITRATE (PF) 250 MCG/5ML IJ SOLN
INTRAMUSCULAR | Status: DC | PRN
  Administered 2021-01-20: 100 ug via INTRAVENOUS

## 2021-01-20 MED ORDER — EPHEDRINE SULFATE 50 MG/ML IJ SOLN
INTRAMUSCULAR | Status: DC | PRN
  Administered 2021-01-20 (×2): 10 mg via INTRAVENOUS
  Administered 2021-01-20 (×2): 15 mg via INTRAVENOUS

## 2021-01-20 MED ORDER — MIDAZOLAM HCL 5 MG/5ML IJ SOLN
INTRAMUSCULAR | Status: DC | PRN
  Administered 2021-01-20: 2 mg via INTRAVENOUS

## 2021-01-20 MED ORDER — LIDOCAINE HCL (CARDIAC) PF 100 MG/5ML IV SOSY
PREFILLED_SYRINGE | INTRAVENOUS | Status: DC | PRN
  Administered 2021-01-20: 60 mg via INTRAVENOUS

## 2021-01-20 NOTE — Anesthesia Postprocedure Evaluation (Signed)
Anesthesia Post Note  Patient: [name]  Procedure(s) Performed: MRI WITH [facility] PAIN WITHOUT CONTRAST (N/A )     Patient location during evaluation: PACU Anesthesia Type: General Level of consciousness: awake and alert Pain management: pain level controlled Vital Signs Assessment: post-procedure vital signs reviewed and stable Respiratory status: spontaneous breathing, nonlabored ventilation, respiratory function stable and patient connected to nasal cannula oxygen Cardiovascular status: blood pressure returned to baseline and stable Postop Assessment: no apparent nausea or vomiting Anesthetic complications: no   No complications documented.  Last Vitals:  Vitals:   01/20/21 1200 01/20/21 1215  BP: (!) 141/59 (!) 134/117  Pulse: 69 65  Resp: 10 12  Temp: 36.6 C (!) 36.3 C  SpO2: 94% 95%    Last Pain:  Vitals:   01/20/21 1215  TempSrc:   PainSc: 4                  Zyrah Wiswell

## 2021-01-20 NOTE — Progress Notes (Signed)
No labs needed per Dr Tacy Dura for this 01/20/21 MRI procedure.

## 2021-01-20 NOTE — Anesthesia Procedure Notes (Signed)
Procedure Name: LMA Insertion Date/Time: 01/20/2021 10:46 AM Performed by: Marena Chancy, CRNA Pre-anesthesia Checklist: Patient identified, Emergency Drugs available, Suction available and Patient being monitored Patient Re-evaluated:Patient Re-evaluated prior to induction Oxygen Delivery Method: Circle System Utilized Preoxygenation: Pre-oxygenation with 100% oxygen Induction Type: IV induction Ventilation: Mask ventilation without difficulty LMA: LMA inserted LMA Size: 4.0 Number of attempts: 1 Airway Equipment and Method: Bite block Placement Confirmation: positive ETCO2 Tube secured with: Tape Dental Injury: Teeth and Oropharynx as per pre-operative assessment

## 2021-01-20 NOTE — Progress Notes (Signed)
1 unsuccessful IV attempt on right hand by K. Dayton Scrape, RN.  2 unsuccessful attempts by C. Katrinka Blazing, RN right arm.

## 2021-01-20 NOTE — Transfer of Care (Signed)
Immediate Anesthesia Transfer of Care Note  Patient: [name]  Procedure(s) Performed: MRI WITH [facility] PAIN WITHOUT CONTRAST (N/A )  Patient Location: PACU  Anesthesia Type:General  Level of Consciousness: awake, alert  and oriented  Airway & Oxygen Therapy: Patient Spontanous Breathing  Post-op Assessment: Report given to RN, Post -op Vital signs reviewed and stable and Patient moving all extremities X 4  Post vital signs: Reviewed and stable  Last Vitals:  Vitals Value Taken Time  BP 141/59 01/20/21 1200  Temp 36.6 C 01/20/21 1200  Pulse 64 01/20/21 1207  Resp 15 01/20/21 1207  SpO2 93 % 01/20/21 1207  Vitals shown include unvalidated device data.  Last Pain:  Vitals:   01/20/21 1200  TempSrc:   PainSc: 5       Patients Stated Pain Goal: 3 (01/20/21 0817)  Complications: No complications documented.

## 2021-01-21 ENCOUNTER — Encounter (HOSPITAL_COMMUNITY): Payer: Self-pay | Admitting: Radiology

## 2021-01-30 ENCOUNTER — Encounter (HOSPITAL_BASED_OUTPATIENT_CLINIC_OR_DEPARTMENT_OTHER): Payer: Self-pay

## 2021-01-30 ENCOUNTER — Emergency Department (HOSPITAL_BASED_OUTPATIENT_CLINIC_OR_DEPARTMENT_OTHER)
Admission: EM | Admit: 2021-01-30 | Discharge: 2021-01-30 | Disposition: A | Discharge status: Home / Self Care | Payer: PPO | Attending: Emergency Medicine | Admitting: Emergency Medicine

## 2021-01-30 ENCOUNTER — Emergency Department (HOSPITAL_BASED_OUTPATIENT_CLINIC_OR_DEPARTMENT_OTHER): Payer: PPO

## 2021-01-30 ENCOUNTER — Other Ambulatory Visit: Payer: Self-pay

## 2021-01-30 DIAGNOSIS — Z96642 Presence of left artificial hip joint: Secondary | ICD-10-CM | POA: Insufficient documentation

## 2021-01-30 DIAGNOSIS — I129 Hypertensive chronic kidney disease with stage 1 through stage 4 chronic kidney disease, or unspecified chronic kidney disease: Secondary | ICD-10-CM | POA: Insufficient documentation

## 2021-01-30 DIAGNOSIS — S0990XA Unspecified injury of head, initial encounter: Secondary | ICD-10-CM | POA: Diagnosis present

## 2021-01-30 DIAGNOSIS — Z96611 Presence of right artificial shoulder joint: Secondary | ICD-10-CM | POA: Insufficient documentation

## 2021-01-30 DIAGNOSIS — Z79899 Other long term (current) drug therapy: Secondary | ICD-10-CM | POA: Diagnosis not present

## 2021-01-30 DIAGNOSIS — I2510 Atherosclerotic heart disease of native coronary artery without angina pectoris: Secondary | ICD-10-CM | POA: Diagnosis not present

## 2021-01-30 DIAGNOSIS — S0181XA Laceration without foreign body of other part of head, initial encounter: Secondary | ICD-10-CM

## 2021-01-30 DIAGNOSIS — Z96652 Presence of left artificial knee joint: Secondary | ICD-10-CM | POA: Diagnosis not present

## 2021-01-30 DIAGNOSIS — W108XXA Fall (on) (from) other stairs and steps, initial encounter: Secondary | ICD-10-CM | POA: Diagnosis not present

## 2021-01-30 DIAGNOSIS — N182 Chronic kidney disease, stage 2 (mild): Secondary | ICD-10-CM | POA: Insufficient documentation

## 2021-01-30 DIAGNOSIS — Z7901 Long term (current) use of anticoagulants: Secondary | ICD-10-CM | POA: Diagnosis not present

## 2021-01-30 DIAGNOSIS — E039 Hypothyroidism, unspecified: Secondary | ICD-10-CM | POA: Diagnosis not present

## 2021-01-30 NOTE — ED Notes (Signed)
Wound irrigated.

## 2021-01-30 NOTE — ED Triage Notes (Addendum)
Pt came In following a fall at 1700 today   States her legs gave out fell while going down the stairs about 3 steps   Sustained lac to left side of forehead  - bleeding controlled -   On blood thinners - denies LOC/ dizzy /  Vomiting -  Also  C/o left hip pain  And left leg pain   Pt  A & O x 4 -  Ambulatory with  Gilmer Mor

## 2021-01-30 NOTE — ED Provider Notes (Signed)
MSE was initiated and I personally evaluated the patient and placed orders (if any) at  8:17 PM on January 30, 2021.  Patient presents after a stumble and fall.  She did sustain a laceration to her left temple region.  Patient is on Xarelto so she knew she need to come to the ED to get evaluated.  Denies any loss of consciousness.  No severe headache.  No vomiting.  She does complain of some mild soreness in her left hip and left leg but she is able to ambulate and does not think it is severe.  On exam she does have a laceration to the left temple region.  Patient is alert and oriented neuro intact  Plan on head CT.  She will need an repair   Linwood Dibbles, MD 01/30/21 2018

## 2021-01-30 NOTE — Discharge Instructions (Addendum)
Please review the discharge instructions on how to care for the Dermabond wound closure dermabond wound closure

## 2021-01-30 NOTE — ED Provider Notes (Signed)
[facility] EMERGENCY DEPT Provider Note   CSN: [id number] Arrival date & time: 01/30/21  1935     History Chief Complaint  Patient presents with  . Fall  . Laceration    [name] is a 75 y.o. female.  HPI   Patient presented to the ED for evaluation after a fall.  Patient states she tripped and stumbled after she felt like her legs gave out.  She hit her head and sustained a laceration.  Patient denies any loss of consciousness.  She does take blood thinners.  She was having some mild soreness in her hip and knee but no significant pain or tenderness.  Patient has been able to walk since her fall.  Past Medical History:  Diagnosis Date  . Anemia   . Arthritis    osteoarthritis  . Claustrophobia   . Complication of anesthesia    hard time waking up after her gallbladder surgery 28 years ago  . DVT (deep venous thrombosis) (HCC)    left leg  . GERD (gastroesophageal reflux disease)    hx of  . Heart murmur    Dr. Katrinka Blazing follows, 'nothing to worry about.'  . History of blood transfusion   . History of hiatal hernia    small  . History of kidney stones   . Hypercholesteremia    under control  . Hypertension   . Hypothyroidism   . Mild aortic stenosis    a. by echo 12/2017.  Marland Kitchen Panic attack   . PONV (postoperative nausea and vomiting)   . Pulmonary embolism (HCC)    bilateral lungs - 2008 after disc surgery    Patient Active Problem List   Diagnosis Date Noted  . Coronary artery disease involving native coronary artery 12/12/2018  . Chest pain 08/18/2018  . Essential hypertension 08/18/2018  . Rupture of quadriceps tendon 12/06/2016  . Chronic kidney disease, stage II (mild) 07/27/2016  . Hiatal hernia 07/27/2016  . Hypothyroidism 07/27/2016  . Hyperlipidemia 07/27/2016  . History of DVT (deep vein thrombosis) 07/27/2016  . Recurrent pulmonary emboli (HCC) 07/27/2016  . Chronic anticoagulation 07/27/2016  . Chronic lower back pain  07/27/2016  . Arthrosis of left acromioclavicular joint 03/31/2015  . Biceps tendonitis on left 03/31/2015  . Chronic right shoulder pain 03/31/2015  . Left shoulder pain 03/31/2015  . S/P left hip revision 12/07/2013  . Visit for suture removal 09/25/2013  . Aneurysm of unspecified site [facility]) 08/28/2013    Past Surgical History:  Procedure Laterality Date  . ABDOMINAL HYSTERECTOMY  1975   partial  . APPENDECTOMY  1973  . BACK SURGERY     X14 1987-2009  . CARDIAC CATHETERIZATION  ~2010  . CARPAL TUNNEL RELEASE Bilateral   . CHOLECYSTECTOMY  1997  . COLONOSCOPY    . COLONOSCOPY WITH PROPOFOL N/A 12/05/2017   Procedure: COLONOSCOPY WITH PROPOFOL;  Surgeon: Carman Ching, MD;  Location: [facility] ENDOSCOPY;  Service: Endoscopy;  Laterality: N/A;  . DILATION AND CURETTAGE OF UTERUS    . ENDARTERECTOMY Right 08/10/2013   Procedure: Excision of Venous Aneurysm Right Neck;  Surgeon: Larina Earthly, MD;  Location: [facility] OR;  Service: Vascular;  Laterality: Right;  . ESOPHAGOGASTRODUODENOSCOPY (EGD) WITH PROPOFOL N/A 12/05/2017   Procedure: ESOPHAGOGASTRODUODENOSCOPY (EGD) WITH PROPOFOL;  Surgeon: Carman Ching, MD;  Location: [facility] ENDOSCOPY;  Service: Endoscopy;  Laterality: N/A;  . filter Left 2009   IVC  . HIP SURGERY Left 2008  . JOINT REPLACEMENT Right 11/2003   (knee)multiple surgeries  .  JOINT REPLACEMENT Left 12/2009   (knee)  . JOINT REPLACEMENT Left 2011, 2012   Hip  . MASS EXCISION  1974   uterus  . QUADRICEPS TENDON REPAIR Right 12/06/2016   Procedure: QUAD TENDON RECONSTRUCTION;  Surgeon: Durene Romans, MD;  Location: [facility] ORS;  Service: Orthopedics;  Laterality: Right;  Requests for 90 mins  . RADIOLOGY WITH ANESTHESIA N/A 01/20/2021   Procedure: MRI WITH [facility] PAIN WITHOUT CONTRAST;  Surgeon: Radiologist, Medication, MD;  Location: [facility] OR;  Service: Radiology;  Laterality: N/A;  . TONSILLECTOMY  1986  . TOTAL HIP REVISION Left 12/07/2013   Procedure:  REVISION  CONSTRAINED LINER LEFT  TOTAL HIP ;  Surgeon: Shelda Pal, MD;  Location: [facility] ORS;  Service: Orthopedics;  Laterality: Left;  . TOTAL SHOULDER REPLACEMENT Right 11/30/2010  . TUBAL LIGATION  1973     OB History   No obstetric history on file.     Family History  Problem Relation Age of Onset  . Diabetes Mother   . Heart disease Mother        before age 28 - ?thick blood - pt does not know if patient had formal heart issue but heart stopped  . Hyperlipidemia Mother   . Hypertension Mother   . Diabetes Father   . Hyperlipidemia Father   . Hypertension Father   . Heart attack Father        in his 58s during adm for flu  . Peripheral vascular disease Father   . Other Father        amputation  . Diabetes Sister   . Hyperlipidemia Sister   . Hypertension Sister   . Deep vein thrombosis Brother   . Hyperlipidemia Brother   . Hypertension Brother     Social History   Tobacco Use  . Smoking status: Never Smoker  . Smokeless tobacco: Never Used  Vaping Use  . Vaping Use: Never used  Substance Use Topics  . Alcohol use: No  . Drug use: No    Home Medications Prior to Admission medications   Medication Sig Start Date End Date Taking? Authorizing Provider  ALPRAZolam Prudy Feeler) 0.5 MG tablet Take 0.5 mg by mouth 2 (two) times daily. 07/17/13   [provider]  amLODipine (NORVASC) 5 MG tablet Take 2.5 mg by mouth at bedtime. 01/24/20   [provider]  atenolol (TENORMIN) 25 MG tablet Take 25 mg by mouth See admin instructions. Take one tablet (25 mg) by mouth every morning, may also take a 2nd tablet (25 mg) if SBP >150 06/11/13   [provider]  baclofen (LIORESAL) 10 MG tablet Take 1 tablet (10 mg total) by mouth every 8 (eight) hours. 12/07/13   Lanney Gins, PA-C  escitalopram (LEXAPRO) 5 MG tablet Take 5 mg by mouth at bedtime. 01/22/20   [provider]  famotidine (PEPCID) 40 MG tablet Take 40 mg by mouth at bedtime. 08/22/18   [provider]   fluticasone (FLONASE) 50 MCG/ACT nasal spray Place 2 sprays into both nostrils daily as needed for allergies. 12/02/18   [provider]  furosemide (LASIX) 20 MG tablet Take 20 mg by mouth See admin instructions. Take one tablet (20 mg) by mouth Monday through Friday 05/28/13   [provider]  gabapentin (NEURONTIN) 600 MG tablet Take 600 mg by mouth 3 (three) times daily. 05/25/13   [provider]  HYDROcodone-acetaminophen (NORCO/VICODIN) 5-325 MG tablet Take 1 tablet by mouth 3 (three) times daily.  [provider]  levothyroxine (SYNTHROID, LEVOTHROID) 50 MCG tablet Take 25 mcg by mouth every Monday, Wednesday, and Friday. 08/09/18   [provider]  losartan (COZAAR) 50 MG tablet Take 50 mg by mouth 2 (two) times daily. 12/13/19   [provider]  ondansetron (ZOFRAN-ODT) 4 MG disintegrating tablet Take 4 mg by mouth 2 (two) times daily as needed for nausea or vomiting. 06/07/18   [provider]  OVER THE COUNTER MEDICATION Take 2 tablets by mouth daily. Multivitamin plus Omega 3 supplement    [provider]  OVER THE COUNTER MEDICATION Apply 1 application topically 3 (three) times daily as needed (joint pain). [place] Dream Cream- Muscle Spasms    [provider]  Polyethyl Glycol-Propyl Glycol (SYSTANE OP) Place 1-2 drops into both eyes at bedtime as needed (for dry eyes).    [provider]  Probiotic Product (PROBIOTIC PO) Take 1 capsule by mouth daily.    [provider]  Rivaroxaban (XARELTO) 20 MG TABS tablet Take 20 mg by mouth daily at 3 pm.  08/11/13   Early, Kristen Loader, MD  simvastatin (ZOCOR) 20 MG tablet Take 20 mg by mouth at bedtime. 05/28/13   [provider]    Allergies    Celecoxib  Review of Systems   Review of Systems  All other systems reviewed and are negative.   Physical Exam Updated Vital Signs BP 139/63   Pulse (!) 52   Temp 98.3 F (36.8 C) (Oral)    Resp 17   Ht 1.6 m (5\' 3" )   Wt 115.7 kg   LMP  (LMP Unknown)   SpO2 100%   BMI 45.17 kg/m   Physical Exam Vitals and nursing note reviewed.  Constitutional:      General: She is not in acute distress.    Appearance: She is well-developed.  HENT:     Head: Normocephalic.     Comments: Approximately 2 cm laceration irregular left temple, no active bleeding    Right Ear: External ear normal.     Left Ear: External ear normal.  Eyes:     General: No scleral icterus.       Right eye: No discharge.        Left eye: No discharge.     Conjunctiva/sclera: Conjunctivae normal.  Neck:     Trachea: No tracheal deviation.  Cardiovascular:     Rate and Rhythm: Normal rate and regular rhythm.  Pulmonary:     Effort: Pulmonary effort is normal. No respiratory distress.     Breath sounds: Normal breath sounds. No stridor. No wheezing or rales.  Abdominal:     General: Bowel sounds are normal. There is no distension.     Palpations: Abdomen is soft.     Tenderness: There is no abdominal tenderness. There is no guarding or rebound.  Musculoskeletal:        General: No tenderness.     Cervical back: Neck supple.  Skin:    General: Skin is warm and dry.     Findings: No rash.  Neurological:     Mental Status: She is alert.     Cranial Nerves: No cranial nerve deficit (no facial droop, extraocular movements intact, no slurred speech).     Sensory: No sensory deficit.     Motor: No abnormal muscle tone or seizure activity.     Coordination: Coordination normal.     ED Results / Procedures / Treatments   Labs (all labs ordered  are listed, but only abnormal results are displayed) Labs Reviewed - No data to display  EKG None  Radiology CT Head Wo Contrast  Result Date: 01/30/2021 CLINICAL DATA:  Fall with minor head trauma. Laceration to the left forehead. Patient is on blood thinners. EXAM: CT HEAD WITHOUT CONTRAST TECHNIQUE: Contiguous axial images were obtained from the base of  the skull through the vertex without intravenous contrast. COMPARISON:  CT head 08/09/2008 FINDINGS: Brain: No evidence of acute infarction, hemorrhage, hydrocephalus, extra-axial collection or mass lesion/mass effect. Vascular: No hyperdense vessel or unexpected calcification. Skull: Normal. Negative for fracture or focal lesion. Sinuses/Orbits: No acute finding. Other: None. IMPRESSION: No acute intracranial process. Electronically Signed   By: Emmaline KluverNancy  Ballantyne M.D.   On: 01/30/2021 20:52    Procedures .Marland Kitchen.Laceration Repair  Date/Time: 01/30/2021 9:32 PM Performed by: Linwood DibblesKnapp, Mishal Probert, MD Authorized by: Linwood DibblesKnapp, Bennette Hasty, MD   Consent:    Consent obtained:  Verbal   Consent given by:  Patient   Risks discussed:  Infection, need for additional repair, pain, poor cosmetic result and poor wound healing   Alternatives discussed:  No treatment and delayed treatment Universal protocol:    Procedure explained and questions answered to patient or proxy's satisfaction: yes     Relevant documents present and verified: yes     Test results available: yes     Imaging studies available: yes     Required blood products, implants, devices, and special equipment available: yes     Site/side marked: yes     Immediately prior to procedure, a time out was called: yes     Patient identity confirmed:  Verbally with patient Anesthesia:    Anesthesia method:  None Laceration details:    Location: Left temple.   Length (cm):  2 Treatment:    Area cleansed with:  Shur-Clens   Amount of cleaning:  Standard Skin repair:    Repair method:  Tissue adhesive Approximation:    Approximation:  Close Repair type:    Repair type:  Simple Post-procedure details:    Dressing:  Open (no dressing)   Procedure completion:  Tolerated well, no immediate complications     Medications Ordered in ED Medications - No data to display  ED Course  I have reviewed the triage vital signs and the nursing notes.  Pertinent labs &  imaging results that were available during my care of the patient were reviewed by me and considered in my medical decision making (see chart for details).    MDM Rules/Calculators/A&P                          Patient had a fall resulting in a laceration of her temple.  She has no focal neurologic deficits.  Patient was able to ambulate without difficulty.  She has not had any symptoms of illness today.  Suspect most likely mechanical fall.  Head CT did not show any acute findings.  Patient's laceration responded well to Dermabond wound closure.  Stable for discharge Final Clinical Impression(s) / ED Diagnoses Final diagnoses:  Laceration of temple, initial encounter    Rx / DC Orders ED Discharge Orders    None       Linwood DibblesKnapp, Mayur Duman, MD 01/30/21 2133

## 2021-02-06 DIAGNOSIS — S22060A Wedge compression fracture of T7-T8 vertebra, initial encounter for closed fracture: Secondary | ICD-10-CM | POA: Diagnosis not present

## 2021-02-11 ENCOUNTER — Other Ambulatory Visit: Payer: Self-pay | Admitting: Neurological Surgery

## 2021-02-11 ENCOUNTER — Telehealth: Payer: Self-pay | Admitting: *Deleted

## 2021-02-11 NOTE — Telephone Encounter (Signed)
   Name: [name]  DOB: June 20, 1946  MRN: [id number]  Primary Cardiologist: Lesleigh Noe, MD  Chart reviewed as part of pre-operative protocol coverage. Because of [name] past medical history and time since last visit, she will require a follow-up visit in order to better assess preoperative cardiovascular risk.  Pre-op covering staff: - Please schedule appointment and call patient to inform them. If patient already had an upcoming appointment within acceptable timeframe, please add "pre-op clearance" to the appointment notes so provider is aware. - Please contact requesting surgeon's office via preferred method (i.e, phone, fax) to inform them of need for appointment prior to surgery.  If applicable, this message will also be routed to pharmacy pool and/or primary cardiologist for input on holding anticoagulant/antiplatelet agent as requested below so that this information is available to the clearing provider at time of patient's appointment.   [place], [place]  02/11/2021, 7:39 PM

## 2021-02-11 NOTE — Telephone Encounter (Signed)
   [facility] Pre-operative Risk Assessment    Patient Name: SHAMMARA JARRETT  DOB: 1946-03-10  MRN: [id number]   HEARTCARE STAFF: - Please ensure there is not already an duplicate clearance open for this procedure. - Under Visit Info/Reason for Call, type in Other and utilize the format Clearance MM/DD/YY or Clearance TBD. Do not use dashes or single digits. - If request is for dental extraction, please clarify the # of teeth to be extracted.  Request for surgical clearance:  1. What type of surgery is being performed? T7 Kyphoplasty   2. When is this surgery scheduled? 02/24/2021   3. What type of clearance is required (medical clearance vs. Pharmacy clearance to hold med vs. Both)? Both  4. Are there any medications that need to be held prior to surgery and how long?Xarelto   5. Practice name and name of physician performing surgery? [facility], Dr Kristeen Miss   6. What is the office phone number? [phone]   7.   What is the office fax number? [phone] Attn: Janett Billow  8.   Anesthesia type (None, local, MAC, general) ? General   Eduard Penkala L 02/11/2021, 5:32 PM  _________________________________________________________________   (provider comments below)

## 2021-02-11 NOTE — Telephone Encounter (Signed)
Clinical pharmacist to confirm holding time of Xarelto is 3 days and whether the patient require Lovenox bridging.  Patient has history of recurrent DVT and PE status post inferior vena cava filter.

## 2021-02-12 NOTE — Telephone Encounter (Signed)
Please contact requesting office and let them know that her anticoagulation is managed by her PCP.  She will need to receive recommendations on holding/bridging from primary care office.  Thank you.  Thomasene Ripple. Teana Lindahl NP-C    02/12/2021, 9:09 AM [facility] [address] Office [phone] Fax [phone]

## 2021-02-12 NOTE — Telephone Encounter (Signed)
Patient with history of multiple DVT/PE per chart.  No indication of dates of occurrences.  Xarelto not managed by our practice.  Will defer to primary provider to determine safety of holding Xarelto x 3 days with or without bridging.

## 2021-02-12 NOTE — Telephone Encounter (Signed)
Sw/ pt today and she has been made aware of needing a pre op appt for cardiac clearance. Pt is agreeable to appt with Edd Fabian, FNP 02/23/21 at the [facility] office. Pt is also aware that PCP will need to give recommendations in regards to her blood thinner as it looks like she may have been put on Xarelto for DVT. Pt has also been placed on Wait List for possible sooner appt. Pt is grateful for the call and the help. Pt aware notes to be faxed to Dr. Danielle Dess with appt note as well as they will need to reach out to her PCP about Xarelto.

## 2021-02-20 ENCOUNTER — Other Ambulatory Visit: Payer: Self-pay

## 2021-02-20 ENCOUNTER — Encounter (HOSPITAL_COMMUNITY)
Admission: RE | Admit: 2021-02-20 | Discharge: 2021-02-20 | Disposition: A | Discharge status: Home / Self Care | Payer: PPO | Source: Ambulatory Visit | Attending: Neurological Surgery | Admitting: Neurological Surgery

## 2021-02-20 ENCOUNTER — Encounter (HOSPITAL_COMMUNITY): Payer: Self-pay

## 2021-02-20 DIAGNOSIS — Z01818 Encounter for other preprocedural examination: Secondary | ICD-10-CM | POA: Insufficient documentation

## 2021-02-20 DIAGNOSIS — I10 Essential (primary) hypertension: Secondary | ICD-10-CM | POA: Diagnosis not present

## 2021-02-20 LAB — CBC
HCT: 35 % — ABNORMAL LOW (ref 36.0–46.0)
Hemoglobin: 10.7 g/dL — ABNORMAL LOW (ref 12.0–15.0)
MCH: 29.8 pg (ref 26.0–34.0)
MCHC: 30.6 g/dL (ref 30.0–36.0)
MCV: 97.5 fL (ref 80.0–100.0)
Platelets: 228 10*3/uL (ref 150–400)
RBC: 3.59 MIL/uL — ABNORMAL LOW (ref 3.87–5.11)
RDW: 12.8 % (ref 11.5–15.5)
WBC: 5.9 10*3/uL (ref 4.0–10.5)
nRBC: 0 % (ref 0.0–0.2)

## 2021-02-20 LAB — BASIC METABOLIC PANEL
Anion gap: 8 (ref 5–15)
BUN: 33 mg/dL — ABNORMAL HIGH (ref 8–23)
CO2: 25 mmol/L (ref 22–32)
Calcium: 9.5 mg/dL (ref 8.9–10.3)
Chloride: 105 mmol/L (ref 98–111)
Creatinine, Ser: 1.46 mg/dL — ABNORMAL HIGH (ref 0.44–1.00)
GFR, Estimated: 38 mL/min — ABNORMAL LOW (ref >60)
Glucose, Bld: 101 mg/dL — ABNORMAL HIGH (ref 70–99)
Potassium: 4.7 mmol/L (ref 3.5–5.1)
Sodium: 138 mmol/L (ref 135–145)

## 2021-02-20 LAB — SURGICAL PCR SCREEN
MRSA, PCR: NEGATIVE
Staphylococcus aureus: POSITIVE — AB

## 2021-02-20 NOTE — Pre-Procedure Instructions (Signed)
Surgical Instructions:    Your procedure is scheduled on Tuesday 02/24/21 (12:22 PM- 1:20 PM).  Report to [facility] Main Entrance "A" at 10:20 A.M., then check in with the Admitting office.  Call this number if you have any questions prior to, or have any problems the morning of surgery:  [phone]    Remember:  Do not eat or drink after midnight the night before your surgery.    Take these medicines the morning of surgery with A SIP OF WATER: ALPRAZolam (XANAX)  atenolol (TENORMIN)  gabapentin (NEURONTIN)   IF NEEDED: baclofen (LIORESAL) HYDROcodone-acetaminophen (NORCO/VICODIN) ondansetron (ZOFRAN-ODT) fluticasone (FLONASE) nasal spray Polyethyl Glycol-Propyl Glycol (SYSTANE OP) eye drops   Follow your surgeon's instructions on when to stop Rivaroxaban (XARELTO).  If no instructions were given by your surgeon then you will need to call the office to get those instructions.     As of today, STOP taking any Aspirin (unless otherwise instructed by your surgeon) Aleve, Naproxen, Ibuprofen, Motrin, Advil, Goody's, BC's, all herbal medications, fish oil, and all vitamins.             Special instructions:   [place]- Preparing For Surgery  Before surgery, you can play an important role. Because skin is not sterile, your skin needs to be as free of germs as possible. You can reduce the number of germs on your skin by washing with CHG (chlorahexidine gluconate) Soap before surgery.  CHG is an antiseptic cleaner which kills germs and bonds with the skin to continue killing germs even after washing.    Oral Hygiene is also important to reduce your risk of infection.  Remember - BRUSH YOUR TEETH THE MORNING OF SURGERY WITH YOUR REGULAR TOOTHPASTE  Please do not use if you have an allergy to CHG or antibacterial soaps. If your skin becomes reddened/irritated stop using the CHG.  Do not shave (including legs and underarms) for at least 48 hours prior to first CHG shower. It is  OK to shave your face.  Please follow these instructions carefully.   1. Shower the NIGHT BEFORE SURGERY and the MORNING OF SURGERY  2. If you chose to wash your hair, wash your hair first as usual with your normal shampoo.  3. After you shampoo, rinse your hair and body thoroughly to remove the shampoo.  4. Wash Face and genitals (private parts) with your normal soap.   5. Use CHG Soap as you would any other liquid soap. You can apply CHG directly to the skin and wash gently with a scrungie or a clean washcloth.   6. Apply the CHG Soap to your body ONLY FROM THE NECK DOWN.  Do not use on open wounds or open sores. Avoid contact with your eyes, ears, mouth and genitals (private parts). Wash Face and genitals (private parts)  with your normal soap.   7. Wash thoroughly, paying special attention to the area where your surgery will be performed.  8. Thoroughly rinse your body with warm water from the neck down.  9. DO NOT shower/wash with your normal soap after using and rinsing off the CHG Soap.  10. Pat yourself dry with a CLEAN TOWEL.  11. Wear CLEAN PAJAMAS to bed the night before surgery.  12. Place CLEAN SHEETS on your bed the night before your surgery.  13. DO NOT SLEEP WITH PETS.   Day of Surgery: SHOWER with CHG soap. Brush your teeth WITH YOUR REGULAR TOOTHPASTE. Wear Clean/Comfortable clothing the morning of surgery. Do not  apply any deodorants/lotions.   Do not wear jewelry, make up, or nail polish. Do not shave 48 hours prior to surgery.   Do NOT Smoke (Tobacco/Vaping) or drink Alcohol 24 hours prior to your procedure. Do not bring valuables to the hospital. [facility] is not responsible for any belongings or valuables.  If you use a CPAP at night, you may bring all equipment for your overnight stay.   Contacts, glasses, or dentures may not be worn into surgery, please bring cases for these belongings.   For patients admitted to the hospital, discharge time  will be determined by your treatment team.   Patients discharged the day of surgery will not be allowed to drive home, and someone needs to stay with them for 24 hours.    Please read over the following fact sheets that you were given.

## 2021-02-20 NOTE — Progress Notes (Signed)
Cardiology Clinic Note   Patient Name: [name] Date of Encounter: 02/23/2021  Primary Care Provider:  Creola Corn, MD Primary Cardiologist:  Lesleigh Noe, MD  Patient Profile    [name] 75 year old female presents the clinic today for a follow-up evaluation of her coronary artery disease, preoperative cardiac evaluation, and hyperlipidemia.  Past Medical History    Past Medical History:  Diagnosis Date  . Anemia   . Arthritis    osteoarthritis  . Claustrophobia   . Complication of anesthesia    hard time waking up after her gallbladder surgery 28 years ago  . DVT (deep venous thrombosis) (HCC)    left leg  . GERD (gastroesophageal reflux disease)    hx of  . Heart murmur    Dr. Katrinka Blazing follows, 'nothing to worry about.'  . History of blood transfusion   . History of hiatal hernia    small  . History of kidney stones   . Hypercholesteremia    under control  . Hypertension   . Hypothyroidism   . Mild aortic stenosis    a. by echo 12/2017.  Marland Kitchen Panic attack   . PONV (postoperative nausea and vomiting)   . Pulmonary embolism (HCC)    bilateral lungs - 2008 after disc surgery   Past Surgical History:  Procedure Laterality Date  . ABDOMINAL HYSTERECTOMY  1975   partial  . APPENDECTOMY  1973  . BACK SURGERY     X14 1987-2009  . CARDIAC CATHETERIZATION  ~2010  . CARPAL TUNNEL RELEASE Bilateral   . CHOLECYSTECTOMY  1997  . COLONOSCOPY    . COLONOSCOPY WITH PROPOFOL N/A 12/05/2017   Procedure: COLONOSCOPY WITH PROPOFOL;  Surgeon: Carman Ching, MD;  Location: [facility];  Service: Endoscopy;  Laterality: N/A;  . DILATION AND CURETTAGE OF UTERUS    . ENDARTERECTOMY Right 08/10/2013   Procedure: Excision of Venous Aneurysm Right Neck;  Surgeon: Larina Earthly, MD;  Location: [facility] OR;  Service: Vascular;  Laterality: Right;  . ESOPHAGOGASTRODUODENOSCOPY (EGD) WITH PROPOFOL N/A 12/05/2017   Procedure: ESOPHAGOGASTRODUODENOSCOPY (EGD) WITH PROPOFOL;   Surgeon: Carman Ching, MD;  Location: [facility];  Service: Endoscopy;  Laterality: N/A;  . filter Left 2009   IVC  . HIP SURGERY Left 2008  . JOINT REPLACEMENT Right 11/2003   (knee)multiple surgeries  . JOINT REPLACEMENT Left 12/2009   (knee)  . JOINT REPLACEMENT Left 2011, 2012   Hip  . MASS EXCISION  1974   uterus  . QUADRICEPS TENDON REPAIR Right 12/06/2016   Procedure: QUAD TENDON RECONSTRUCTION;  Surgeon: Durene Romans, MD;  Location: [facility];  Service: Orthopedics;  Laterality: Right;  Requests for 90 mins  . RADIOLOGY WITH ANESTHESIA N/A 01/20/2021   Procedure: MRI WITH [facility] PAIN WITHOUT CONTRAST;  Surgeon: Radiologist, Medication, MD;  Location: [facility] OR;  Service: Radiology;  Laterality: N/A;  . TONSILLECTOMY  1986  . TOTAL HIP REVISION Left 12/07/2013   Procedure:  REVISION  CONSTRAINED LINER LEFT TOTAL HIP ;  Surgeon: Shelda Pal, MD;  Location: [facility];  Service: Orthopedics;  Laterality: Left;  . TOTAL SHOULDER REPLACEMENT Right 11/30/2010  . TUBAL LIGATION  1973    Allergies  Allergies  Allergen Reactions  . Celecoxib Rash    History of Present Illness    [name] has a PMH of recurrent DVT, recurrent PE status post IVC filter, hiatal hernia, chest pain, HTN, hypothyroidism, aortic stenosis, and HLD.  She underwent cardiac catheterization in 2019  which showed normal coronary arteries.  A coronary CTA 11/19 showed nonobstructive disease with a suggestion of ischemia in the inferiolateral branch.  She was seen by Dr. Katrinka Blazing on 12/12/2018.  During that time she was doing well.  She spent significant time discussing results of her cardiac evaluation.  It was explained that she had no significant obstructive disease and no significant valvular disease.  She presents the clinic today for cardiac evaluation and preoperative cardiac evaluation.  She states she is limited in her physical activity due to her orthopedic issues and back pain.  She reports that she is able to walk  but it takes her an extended period of time if she needs to walk any distance.  She reports compliance with heart healthy low-sodium diet.  She has not had any bleeding issues and reports compliance on her Xarelto.  We reviewed her coronary CTA and she expressed understanding.  We also reviewed her echocardiogram.  I will have her follow-up in 3 to 6 months, give her the salty 6 diet sheet, have her increase her physical activity as tolerated, and continue her current medication regimen.  Today she denies chest pain, shortness of breath, lower extremity edema, fatigue, palpitations, melena, hematuria, hemoptysis, diaphoresis, weakness, presyncope, syncope, orthopnea, and PND.   Home Medications    Prior to Admission medications   Medication Sig Start Date End Date Taking? Authorizing Provider  ALPRAZolam Prudy Feeler) 0.5 MG tablet Take 0.5 mg by mouth 2 (two) times daily. 07/17/13   [provider]  amLODipine (NORVASC) 5 MG tablet Take 2.5 mg by mouth at bedtime. 01/24/20   [provider]  atenolol (TENORMIN) 25 MG tablet Take 25 mg by mouth See admin instructions. Take one tablet (25 mg) by mouth every morning, may also take a 2nd tablet (25 mg) if SBP >150 06/11/13   [provider]  baclofen (LIORESAL) 10 MG tablet Take 1 tablet (10 mg total) by mouth every 8 (eight) hours. 12/07/13   Lanney Gins, PA-C  escitalopram (LEXAPRO) 5 MG tablet Take 5 mg by mouth at bedtime. 01/22/20   [provider]  famotidine (PEPCID) 40 MG tablet Take 40 mg by mouth at bedtime. 08/22/18   [provider]  fluticasone (FLONASE) 50 MCG/ACT nasal spray Place 2 sprays into both nostrils daily as needed for allergies. 12/02/18   [provider]  furosemide (LASIX) 20 MG tablet Take 20 mg by mouth See admin instructions. Take one tablet (20 mg) by mouth Monday through Friday 05/28/13   [provider]  gabapentin (NEURONTIN) 600 MG tablet Take 600 mg by mouth 3  (three) times daily. 05/25/13   [provider]  HYDROcodone-acetaminophen (NORCO/VICODIN) 5-325 MG tablet Take 1 tablet by mouth 3 (three) times daily.    [provider]  levothyroxine (SYNTHROID, LEVOTHROID) 50 MCG tablet Take 25 mcg by mouth every Monday, Wednesday, and Friday. 08/09/18   [provider]  losartan (COZAAR) 50 MG tablet Take 50 mg by mouth 2 (two) times daily. 12/13/19   [provider]  ondansetron (ZOFRAN-ODT) 4 MG disintegrating tablet Take 4 mg by mouth 2 (two) times daily as needed for nausea or vomiting. 06/07/18   [provider]  OVER THE COUNTER MEDICATION Take 2 tablets by mouth daily. Multivitamin plus Omega 3 supplement    [provider]  OVER THE COUNTER MEDICATION Apply 1 application topically 3 (three) times daily as needed (joint pain). [place] Dream Cream- Muscle Spasms    [provider]  Polyethyl Glycol-Propyl Glycol (SYSTANE OP) Place 1-2 drops into both eyes at bedtime as needed (for dry eyes).    [provider]  Probiotic Product (PROBIOTIC PO) Take 1 capsule by mouth daily.    [provider]  Rivaroxaban (XARELTO) 20 MG TABS tablet Take 20 mg by mouth daily at 3 pm.  08/11/13   Early, Kristen Loader, MD  simvastatin (ZOCOR) 20 MG tablet Take 20 mg by mouth at bedtime. 05/28/13   [provider]    Family History    Family History  Problem Relation Age of Onset  . Diabetes Mother   . Heart disease Mother        before age 46 - ?thick blood - pt does not know if patient had formal heart issue but heart stopped  . Hyperlipidemia Mother   . Hypertension Mother   . Diabetes Father   . Hyperlipidemia Father   . Hypertension Father   . Heart attack Father        in his 38s during adm for flu  . Peripheral vascular disease Father   . Other Father        amputation  . Diabetes Sister   . Hyperlipidemia Sister   . Hypertension Sister   . Deep vein thrombosis Brother    . Hyperlipidemia Brother   . Hypertension Brother    She indicated that her mother is deceased. She indicated that her father is deceased. She indicated that her sister is alive. She indicated that her brother is alive.  Social History    Social History   Socioeconomic History  . Marital status: Married    Spouse name: Not on file  . Number of children: Not on file  . Years of education: Not on file  . Highest education level: Not on file  Occupational History  . Not on file  Tobacco Use  . Smoking status: Never Smoker  . Smokeless tobacco: Never Used  Vaping Use  . Vaping Use: Never used  Substance and Sexual Activity  . Alcohol use: No  . Drug use: No  . Sexual activity: Not Currently    Birth control/protection: Surgical    Comment: Hysterectomy  Other Topics Concern  . Not on file  Social History Narrative  . Not on file   Social Determinants of Health   Financial Resource Strain: Not on file  Food Insecurity: Not on file  Transportation Needs: Not on file  Physical Activity: Not on file  Stress: Not on file  Social Connections: Not on file  Intimate Partner Violence: Not on file     Review of Systems    General:  No chills, fever, night sweats or weight changes.  Cardiovascular:  No chest pain, dyspnea on exertion, edema, orthopnea, palpitations, paroxysmal nocturnal dyspnea. Dermatological: No rash, lesions/masses Respiratory: No cough, dyspnea Urologic: No hematuria, dysuria Abdominal:   No nausea, vomiting, diarrhea, bright red blood per rectum, melena, or hematemesis Neurologic:  No visual changes, wkns, changes in mental status. All other systems reviewed and are otherwise negative except as noted above.  Physical Exam    VS:  BP 140/76   Pulse (!) 57   Ht 5\' 3"  (1.6 m)   Wt 271 lb 6.4 oz (123.1 kg)   LMP  (LMP Unknown)   SpO2 96%   BMI 48.08 kg/m  , BMI Body mass index is 48.08 kg/m. GEN: Well nourished, well developed, in no acute  distress. HEENT: normal. Neck: Supple, no  JVD, carotid bruits, or masses. Cardiac: RRR, no murmurs, rubs, or gallops. No clubbing, cyanosis, edema.  Radials/DP/PT 2+ and equal bilaterally.  Respiratory:  Respirations regular and unlabored, clear to auscultation bilaterally. GI: Soft, nontender, nondistended, BS + x 4. MS: no deformity or atrophy. Skin: warm and dry, no rash. Neuro:  Strength and sensation are intact. Psych: Normal affect.  Accessory Clinical Findings    Recent Labs: 02/20/2021: BUN 33; Creatinine, Ser 1.46; Hemoglobin 10.7; Platelets 228; Potassium 4.7; Sodium 138   Recent Lipid Panel No results found for: CHOL, TRIG, HDL, CHOLHDL, VLDL, LDLCALC, LDLDIRECT  ECG personally reviewed by me today-none today.  Echocardiogram 01/12/2018 Study Conclusions   - Left ventricle: The cavity size was normal. Wall thickness was  normal. Systolic function was normal. The estimated ejection  fraction was in the range of 55% to 60%.  - Aortic valve: AV is difficult to see well It is mildly thickened.  Peak and mean gradients through the valve are 24 and 11 mm Hg  respectively consistent with mild AS.   Coronary CTA 08/19/2018 FINDINGS: FFRct analysis was performed on the original cardiac CT angiogram dataset. Diagrammatic representation of the FFRct analysis is provided in a separate PDF document in PACS. This dictation was created using the PDF document and an interactive 3D model of the results. 3D model is not available in the EMR/PACS. Normal FFR range is >0.80.  1. Left Main:  No significant stenosis. FFR =0.98  2. LAD: No significant stenosis. Proximal FFR = 0.97, Mid FFR = 0.97, Distal FFR = 0.91 3. LCX: No significant stenosis. Proximal FFR = 0.95, Distal FFR = 0.90 4. RCA: No significant stenosis. Proximal FFR =0.99, Mid FFR = 0.96, Distal FFR = 0.95, PDA FFR = 0.90, PL proximal FFR = 0.94, PL distal FFR = 0.76  IMPRESSION: 1. CT FFR analysis showed  no significant proximal stenosis. Distal posterolateral branch FFR = 0.76.   Electronically Signed   By: Weston BrassGayatri  Acharya   On: 08/20/2018 13:04   Assessment & Plan   1.  Nonobstructive coronary artery disease- no chest pain today.  Coronary CTA showed no significant proximal stenosis and distal posterior lateral branch FFR 0.76. Continue losartan, furosemide Heart healthy low-sodium diet-salty 6 given Increase physical activity as tolerated  Essential hypertension-BP today 140/76.  Well-controlled at home Continue amlodipine, atenolol, losartan Heart healthy low-sodium diet-salty 6 given Increase physical activity as tolerated  Recurrent DVT/PE- no recent episodes of chest or back pain.  No recent episodes of shortness of breath.  Denies bleeding issues.  Reports compliance with Xarelto Continue Xarelto Heart healthy low-sodium diet-salty 6 given Increase physical activity as tolerated Follows with PCP  Hyperlipidemia-LDL 92.  07/15/2020 Continue simvastatin Heart healthy low-sodium high-fiber diet Follows with PCP  Preoperative cardiac evaluation- T7 kyphoplasty, [facility] Dr. Barnett AbuHenry Elsner  Primary Cardiologist: Lesleigh NoeHenry W Smith III, MD  Chart reviewed as part of pre-operative protocol coverage. Given past medical history and time since last visit, based on ACC/AHA guidelines, [name] would be at acceptable risk for the planned procedure without further cardiovascular testing.   Her RCRI is a class II risk, 0.9% risk of major cardiac event.  Patient was advised that if she develops new symptoms prior to surgery to contact our office to arrange a follow-up appointment.  He verbalized understanding.  I will route this recommendation to the requesting party via Epic fax function and remove from pre-op pool.  Please call with questions.  Disposition:  Follow-up with Dr. Katrinka Blazing or APP in 3 to 6 months.  Thomasene Ripple. Adalynd Donahoe NP-C    02/23/2021,  12:36 PM [facility] [address] Office [phone] Fax [phone]  Notice: This dictation was prepared with Dragon dictation along with smaller phrase technology. Any transcriptional errors that result from this process are unintentional and may not be corrected upon review.  I spent 15 minutes examining this patient, reviewing medications, and using patient centered shared decision making involving her cardiac care.  Prior to her visit I spent greater than 20 minutes reviewing her past medical history,  medications, and prior cardiac tests.

## 2021-02-20 NOTE — Progress Notes (Signed)
PCP - Creola Corn, MD Cardiologist - Verdis Prime, MD GI- [name] Modena, MD  PPM/ICD - Denies  Chest x-ray - N/A EKG - 02/20/21 Stress Test - 08/05/16 ECHO - 01/12/18 Cardiac Cath - 01/17/08  Sleep Study - Denies  Pt denies being diabetic.  Blood Thinner Instructions: Per pt, she states Dr. Danielle Dess instructed her to stop Xarelto 48 hours prior to sx.  Aspirin Instructions: N/A  ERAS Protcol - N/A PRE-SURGERY Ensure or G2- N/A  COVID TEST- 02/23/21   Anesthesia review: Yes, cardiac hx; PE; DVT hx.  Patient denies shortness of breath, fever, cough and chest pain at PAT appointment   All instructions explained to the patient, with a verbal understanding of the material. Patient agrees to go over the instructions while at home for a better understanding. Patient also instructed to self quarantine after being tested for COVID-19. The opportunity to ask questions was provided.

## 2021-02-23 ENCOUNTER — Encounter: Payer: Self-pay | Admitting: General Practice

## 2021-02-23 ENCOUNTER — Other Ambulatory Visit (HOSPITAL_COMMUNITY): Payer: PPO

## 2021-02-23 ENCOUNTER — Ambulatory Visit: Payer: PPO | Admitting: General Practice

## 2021-02-23 ENCOUNTER — Other Ambulatory Visit: Payer: Self-pay

## 2021-02-23 ENCOUNTER — Other Ambulatory Visit (HOSPITAL_COMMUNITY)
Admission: RE | Admit: 2021-02-23 | Discharge: 2021-02-23 | Disposition: A | Discharge status: Home / Self Care | Payer: PPO | Source: Ambulatory Visit | Attending: Neurological Surgery | Admitting: Neurological Surgery

## 2021-02-23 VITALS — BP 140/76 | HR 57 | Ht 63.0 in | Wt 271.4 lb

## 2021-02-23 DIAGNOSIS — I10 Essential (primary) hypertension: Secondary | ICD-10-CM

## 2021-02-23 DIAGNOSIS — Z01812 Encounter for preprocedural laboratory examination: Secondary | ICD-10-CM | POA: Diagnosis not present

## 2021-02-23 DIAGNOSIS — I2699 Other pulmonary embolism without acute cor pulmonale: Secondary | ICD-10-CM | POA: Diagnosis not present

## 2021-02-23 DIAGNOSIS — Z01810 Encounter for preprocedural cardiovascular examination: Secondary | ICD-10-CM

## 2021-02-23 DIAGNOSIS — Z20822 Contact with and (suspected) exposure to covid-19: Secondary | ICD-10-CM | POA: Insufficient documentation

## 2021-02-23 DIAGNOSIS — I2510 Atherosclerotic heart disease of native coronary artery without angina pectoris: Secondary | ICD-10-CM

## 2021-02-23 LAB — SARS CORONAVIRUS 2 (TAT 6-24 HRS): SARS Coronavirus 2: NEGATIVE

## 2021-02-23 NOTE — Patient Instructions (Signed)
Medication Instructions:  The current medical regimen is effective;  continue present plan and medications as directed. Please refer to the Current Medication list given to you today.  *If you need a refill on your cardiac medications before your next appointment, please call your pharmacy*  Lab Work:   Testing/Procedures:  NONE    NONE  Special Instructions CLEARED FOR SURGERY  PLEASE READ AND FOLLOW SALTY 6-ATTACHED-1,800mg  daily  PLEASE INCREASE PHYSICAL ACTIVITY AS TOLERATED  Follow-Up: Your next appointment:  6 month(s) In Person with You may see Lesleigh Noe, MD or Georgie Chard, NP  Please call our office 2 months in advance to schedule this appointment   At [facility], you and your health needs are our priority.  As part of our continuing mission to provide you with exceptional heart care, we have created designated Provider Care Teams.  These Care Teams include your primary Cardiologist (physician) and Advanced Practice Providers (APPs -  Physician Assistants and Nurse Practitioners) who all work together to provide you with the care you need, when you need it.  We recommend signing up for the patient portal called "MyChart".  Sign up information is provided on this After Visit Summary.  MyChart is used to connect with patients for Virtual Visits (Telemedicine).  Patients are able to view lab/test results, encounter notes, upcoming appointments, etc.  Non-urgent messages can be sent to your provider as well.   To learn more about what you can do with MyChart, go to [email or web address].              6 SALTY THINGS TO AVOID     1,800MG  DAILY

## 2021-02-23 NOTE — Progress Notes (Signed)
Anesthesia Chart Review:  Follows with cardiology for history of recurrent DVT, recurrent PE status post IVC filter, chest pain, HTN, HLD. She underwent cardiac catheterization in 2009 which showed normal coronary arteries.  A coronary CTA 11/19 showed nonobstructive disease with a suggestion of ischemia in the inferiolateral branch.  Last seen 02/23/2021 for preop evaluation.  Per note, "Chart reviewed as part of pre-operative protocol coverage. Given past medical history and time since last visit, based on ACC/AHA guidelines, [name] would be at acceptable risk for the planned procedure without further cardiovascular testing. Her RCRI is a class II risk, 0.9% risk of major cardiac event."  Per patient, she was instructed by Dr. Danielle [name] to hold Xarelto 48 hours prior to surgery.  History of renal insufficiency, followed by her PCP Gail Lamas, NP.  Per note 08/11/2020, creatinine ranges 1.1-1.9.  Preop labs reviewed, creatinine mildly elevated at 1.46, mild anemia with hemoglobin 10.7, otherwise unremarkable.  EKG 02/20/2021: Sinus bradycardia.  LVH.  Rate 53.  Coronary CTA 08/19/2018 FINDINGS: FFRct analysis was performed on the original cardiac CT angiogram dataset. Diagrammatic representation of the FFRct analysis is provided in a separate PDF document in PACS. This dictation was created using the PDF document and an interactive 3D model of the results. 3D model is not available in the EMR/PACS. Normal FFR range is >0.80.  1. Left Main: No significant stenosis. FFR =0.98  2. LAD: No significant stenosis. Proximal FFR = 0.97, Mid FFR = 0.97, Distal FFR = 0.91 3. LCX: No significant stenosis. Proximal FFR = 0.95, Distal FFR = 0.90 4. RCA: No significant stenosis. Proximal FFR =0.99, Mid FFR = 0.96, Distal FFR = 0.95, PDA FFR = 0.90, PL proximal FFR = 0.94, PL distal FFR = 0.76  IMPRESSION: 1. CT FFR analysis showed no significant proximal stenosis. Distal posterolateral  branch FFR = 0.76.  Echocardiogram 01/12/2018 Study Conclusions   - Left ventricle: The cavity size was normal. Wall thickness was  normal. Systolic function was normal. The estimated ejection  fraction was in the range of 55% to 60%.  - Aortic valve: AV is difficult to see well It is mildly thickened.  Peak and mean gradients through the valve are 24 and 11 mm Hg  respectively consistent with mild AS.     [name] [phone] 02/23/2021 3:34 PM

## 2021-02-23 NOTE — Anesthesia Preprocedure Evaluation (Addendum)
Anesthesia Evaluation  Patient identified by MRN, date of birth, ID band Patient awake    Reviewed: Allergy & Precautions, NPO status , Patient's Chart, lab work & pertinent test results  Airway Mallampati: III  TM Distance: >3 FB Neck ROM: Full    Dental  (+) Dental Advisory Given   Pulmonary neg pulmonary ROS,    breath sounds clear to auscultation       Cardiovascular hypertension, Pt. on medications (-) angina+ CAD and + DVT (hx of PE on xarelto. last dose 5/13)  + Valvular Problems/Murmurs (mild AS) AS  Rhythm:Regular Rate:Normal     Neuro/Psych negative neurological ROS     GI/Hepatic Neg liver ROS, hiatal hernia, GERD  ,  Endo/Other  Hypothyroidism   Renal/GU CRFRenal disease     Musculoskeletal  (+) Arthritis ,   Abdominal   Peds  Hematology  (+) anemia ,   Anesthesia Other Findings   Reproductive/Obstetrics                            Lab Results  Component Value Date   WBC 5.9 02/20/2021   HGB 10.7 (L) 02/20/2021   HCT 35.0 (L) 02/20/2021   MCV 97.5 02/20/2021   PLT 228 02/20/2021   Lab Results  Component Value Date   CREATININE 1.46 (H) 02/20/2021   BUN 33 (H) 02/20/2021   NA 138 02/20/2021   K 4.7 02/20/2021   CL 105 02/20/2021   CO2 25 02/20/2021    Anesthesia Physical Anesthesia Plan  ASA: III  Anesthesia Plan: General   Post-op Pain Management:    Induction: Intravenous  PONV Risk Score and Plan: 3 and Dexamethasone, Ondansetron and Treatment may vary due to age or medical condition  Airway Management Planned: Oral ETT  Additional Equipment:   Intra-op Plan:   Post-operative Plan: Extubation in OR  Informed Consent: I have reviewed the patients History and Physical, chart, labs and discussed the procedure including the risks, benefits and alternatives for the proposed anesthesia with the patient or authorized representative who has indicated  his/her understanding and acceptance.     Dental advisory given  Plan Discussed with: CRNA  Anesthesia Plan Comments: (PAT note by Antionette Poles, PA-C: Follows with cardiology for history of recurrent DVT, recurrent PE status post IVC filter, chest pain, HTN, HLD. She underwent cardiac catheterization in 2009 which showed normal coronary arteries.  A coronary CTA 11/19 showed nonobstructive disease with a suggestion of ischemia in the inferiolateral branch.  Last seen 02/23/2021 for preop evaluation.  Per note, "Chart reviewed as part of pre-operative protocol coverage. Given past medical history and time since last visit, based on ACC/AHA guidelines, ASHLA MURPH would be at acceptable risk for the planned procedure without further cardiovascular testing. Her RCRI is a class II risk, 0.9% risk of major cardiac event."  Per patient, she was instructed by Dr. Danielle Dess to hold Xarelto 48 hours prior to surgery.  History of renal insufficiency, followed by her PCP Jarome Lamas, NP.  Per note 08/11/2020, creatinine ranges 1.1-1.9.  Preop labs reviewed, creatinine mildly elevated at 1.46, mild anemia with hemoglobin 10.7, otherwise unremarkable.  EKG 02/20/2021: Sinus bradycardia.  LVH.  Rate 53.  Coronary CTA 08/19/2018 FINDINGS: FFRct analysis was performed on the original cardiac CT angiogram dataset. Diagrammatic representation of the FFRct analysis is provided in a separate PDF document in PACS. This dictation was created using the PDF document and an interactive 3D model  of the results. 3D model is not available in the EMR/PACS. Normal FFR range is >0.80.  1. Left Main: No significant stenosis. FFR =0.98  2. LAD: No significant stenosis. Proximal FFR = 0.97, Mid FFR = 0.97, Distal FFR = 0.91 3. LCX: No significant stenosis. Proximal FFR = 0.95, Distal FFR = 0.90 4. RCA: No significant stenosis. Proximal FFR =0.99, Mid FFR = 0.96, Distal FFR = 0.95, PDA FFR = 0.90, PL proximal FFR =  0.94, PL distal FFR = 0.76  IMPRESSION: 1. CT FFR analysis showed no significant proximal stenosis. Distal posterolateral branch FFR = 0.76.  Echocardiogram 01/12/2018 Study Conclusions   - Left ventricle: The cavity size was normal. Wall thickness was  normal. Systolic function was normal. The estimated ejection  fraction was in the range of 55% to 60%.  - Aortic valve: AV is difficult to see well It is mildly thickened.  Peak and mean gradients through the valve are 24 and 11 mm Hg  respectively consistent with mild AS.  )       Anesthesia Quick Evaluation

## 2021-03-01 NOTE — Progress Notes (Signed)
Instructed pt. To arrive at 1325 on 03/03/21 for surgery. Npo at midnight.  Reviewed PAT instructions with pt. Which she received on her visit on 02/20/2021. Covid test is 03/02/21. Pt. Given opportunity to ask questions.

## 2021-03-02 ENCOUNTER — Other Ambulatory Visit (HOSPITAL_COMMUNITY)
Admission: RE | Admit: 2021-03-02 | Discharge: 2021-03-02 | Disposition: A | Discharge status: Home / Self Care | Payer: PPO | Source: Ambulatory Visit | Attending: Neurological Surgery | Admitting: Neurological Surgery

## 2021-03-02 DIAGNOSIS — Z20822 Contact with and (suspected) exposure to covid-19: Secondary | ICD-10-CM | POA: Insufficient documentation

## 2021-03-02 DIAGNOSIS — Z01812 Encounter for preprocedural laboratory examination: Secondary | ICD-10-CM | POA: Insufficient documentation

## 2021-03-02 MED ORDER — CEFAZOLIN IN SODIUM CHLORIDE 3-0.9 GM/100ML-% IV SOLN
3.0000 g | INTRAVENOUS | Status: AC
  Administered 2021-03-03: 3 g via INTRAVENOUS
  Filled 2021-03-02 (×2): qty 100

## 2021-03-03 ENCOUNTER — Other Ambulatory Visit: Payer: Self-pay

## 2021-03-03 ENCOUNTER — Encounter (HOSPITAL_COMMUNITY): Payer: Self-pay | Admitting: Neurological Surgery

## 2021-03-03 ENCOUNTER — Ambulatory Visit (HOSPITAL_COMMUNITY): Payer: PPO

## 2021-03-03 ENCOUNTER — Observation Stay (HOSPITAL_COMMUNITY)
Admission: RE | Admit: 2021-03-03 | Discharge: 2021-03-03 | Disposition: A | Discharge status: Home / Self Care | Payer: PPO | Attending: Neurological Surgery | Admitting: Neurological Surgery

## 2021-03-03 ENCOUNTER — Encounter (HOSPITAL_COMMUNITY)
Admission: RE | Disposition: A | Discharge status: Home / Self Care | Payer: Self-pay | Source: Home / Self Care | Attending: Neurological Surgery

## 2021-03-03 ENCOUNTER — Ambulatory Visit (HOSPITAL_COMMUNITY): Payer: PPO | Admitting: Anesthesiology

## 2021-03-03 ENCOUNTER — Ambulatory Visit (HOSPITAL_COMMUNITY): Payer: PPO | Admitting: Physician Assistant

## 2021-03-03 DIAGNOSIS — Z79899 Other long term (current) drug therapy: Secondary | ICD-10-CM | POA: Diagnosis not present

## 2021-03-03 DIAGNOSIS — Z96611 Presence of right artificial shoulder joint: Secondary | ICD-10-CM | POA: Diagnosis not present

## 2021-03-03 DIAGNOSIS — Z981 Arthrodesis status: Secondary | ICD-10-CM | POA: Diagnosis not present

## 2021-03-03 DIAGNOSIS — E039 Hypothyroidism, unspecified: Secondary | ICD-10-CM | POA: Insufficient documentation

## 2021-03-03 DIAGNOSIS — Z419 Encounter for procedure for purposes other than remedying health state, unspecified: Secondary | ICD-10-CM

## 2021-03-03 DIAGNOSIS — S22060A Wedge compression fracture of T7-T8 vertebra, initial encounter for closed fracture: Principal | ICD-10-CM | POA: Insufficient documentation

## 2021-03-03 DIAGNOSIS — I10 Essential (primary) hypertension: Secondary | ICD-10-CM | POA: Diagnosis not present

## 2021-03-03 DIAGNOSIS — Z96642 Presence of left artificial hip joint: Secondary | ICD-10-CM | POA: Diagnosis not present

## 2021-03-03 DIAGNOSIS — D631 Anemia in chronic kidney disease: Secondary | ICD-10-CM | POA: Diagnosis not present

## 2021-03-03 DIAGNOSIS — M4324 Fusion of spine, thoracic region: Secondary | ICD-10-CM | POA: Diagnosis not present

## 2021-03-03 DIAGNOSIS — S22060G Wedge compression fracture of T7-T8 vertebra, subsequent encounter for fracture with delayed healing: Secondary | ICD-10-CM | POA: Diagnosis not present

## 2021-03-03 DIAGNOSIS — I129 Hypertensive chronic kidney disease with stage 1 through stage 4 chronic kidney disease, or unspecified chronic kidney disease: Secondary | ICD-10-CM | POA: Diagnosis not present

## 2021-03-03 DIAGNOSIS — X58XXXA Exposure to other specified factors, initial encounter: Secondary | ICD-10-CM | POA: Insufficient documentation

## 2021-03-03 DIAGNOSIS — Z96653 Presence of artificial knee joint, bilateral: Secondary | ICD-10-CM | POA: Diagnosis not present

## 2021-03-03 DIAGNOSIS — M4854XA Collapsed vertebra, not elsewhere classified, thoracic region, initial encounter for fracture: Secondary | ICD-10-CM | POA: Diagnosis not present

## 2021-03-03 DIAGNOSIS — S22000A Wedge compression fracture of unspecified thoracic vertebra, initial encounter for closed fracture: Secondary | ICD-10-CM | POA: Diagnosis present

## 2021-03-03 DIAGNOSIS — N182 Chronic kidney disease, stage 2 (mild): Secondary | ICD-10-CM | POA: Diagnosis not present

## 2021-03-03 HISTORY — PX: KYPHOPLASTY: SHX5884

## 2021-03-03 LAB — SARS CORONAVIRUS 2 (TAT 6-24 HRS): SARS Coronavirus 2: NEGATIVE

## 2021-03-03 SURGERY — KYPHOPLASTY
Anesthesia: General | Site: Back

## 2021-03-03 MED ORDER — MENTHOL 3 MG MT LOZG
1.0000 | LOZENGE | OROMUCOSAL | Status: DC | PRN
  Filled 2021-03-03: qty 9

## 2021-03-03 MED ORDER — ONDANSETRON HCL 4 MG PO TABS: 4.0000 mg | ORAL_TABLET | Freq: Four times a day (QID) | ORAL | Status: DC | PRN

## 2021-03-03 MED ORDER — SENNA 8.6 MG PO TABS: 1.0000 | ORAL_TABLET | Freq: Two times a day (BID) | ORAL | Status: DC

## 2021-03-03 MED ORDER — DOCUSATE SODIUM 100 MG PO CAPS: 100.0000 mg | ORAL_CAPSULE | Freq: Two times a day (BID) | ORAL | Status: DC

## 2021-03-03 MED ORDER — CHLORHEXIDINE GLUCONATE 0.12 % MT SOLN
15.0000 mL | Freq: Once | OROMUCOSAL | Status: AC
  Administered 2021-03-03: 15 mL via OROMUCOSAL
  Filled 2021-03-03: qty 15

## 2021-03-03 MED ORDER — POLYETHYL GLYCOL-PROPYL GLYCOL 0.4-0.3 % OP GEL: Freq: Every evening | OPHTHALMIC | Status: DC | PRN

## 2021-03-03 MED ORDER — ALPRAZOLAM 0.5 MG PO TABS: 0.5000 mg | ORAL_TABLET | Freq: Two times a day (BID) | ORAL | Status: DC

## 2021-03-03 MED ORDER — CHLORHEXIDINE GLUCONATE CLOTH 2 % EX PADS: 6.0000 | MEDICATED_PAD | Freq: Once | CUTANEOUS | Status: DC

## 2021-03-03 MED ORDER — FLUTICASONE PROPIONATE 50 MCG/ACT NA SUSP: 2.0000 | Freq: Every day | NASAL | Status: DC | PRN

## 2021-03-03 MED ORDER — ACETAMINOPHEN 650 MG RE SUPP: 650.0000 mg | RECTAL | Status: DC | PRN

## 2021-03-03 MED ORDER — CEFAZOLIN IN SODIUM CHLORIDE 3-0.9 GM/100ML-% IV SOLN
INTRAVENOUS | Status: AC
  Filled 2021-03-03: qty 100

## 2021-03-03 MED ORDER — ONDANSETRON HCL 4 MG/2ML IJ SOLN: 4.0000 mg | Freq: Four times a day (QID) | INTRAMUSCULAR | Status: DC | PRN

## 2021-03-03 MED ORDER — AMISULPRIDE (ANTIEMETIC) 5 MG/2ML IV SOLN: 10.0000 mg | Freq: Once | INTRAVENOUS | Status: DC | PRN

## 2021-03-03 MED ORDER — POLYETHYLENE GLYCOL 3350 17 G PO PACK: 17.0000 g | PACK | Freq: Every day | ORAL | Status: DC | PRN

## 2021-03-03 MED ORDER — 0.9 % SODIUM CHLORIDE (POUR BTL) OPTIME
TOPICAL | Status: DC | PRN
  Administered 2021-03-03: 1000 mL

## 2021-03-03 MED ORDER — PHENYLEPHRINE 40 MCG/ML (10ML) SYRINGE FOR IV PUSH (FOR BLOOD PRESSURE SUPPORT)
PREFILLED_SYRINGE | INTRAVENOUS | Status: AC
  Filled 2021-03-03: qty 10

## 2021-03-03 MED ORDER — FLEET ENEMA 7-19 GM/118ML RE ENEM: 1.0000 | ENEMA | Freq: Once | RECTAL | Status: DC | PRN

## 2021-03-03 MED ORDER — PROPOFOL 10 MG/ML IV BOLUS
INTRAVENOUS | Status: DC | PRN
  Administered 2021-03-03: 50 mg via INTRAVENOUS
  Administered 2021-03-03: 150 mg via INTRAVENOUS

## 2021-03-03 MED ORDER — ATENOLOL 25 MG PO TABS: 25.0000 mg | ORAL_TABLET | Freq: Every day | ORAL | Status: DC

## 2021-03-03 MED ORDER — SODIUM CHLORIDE 0.9 % IV SOLN: 250.0000 mL | INTRAVENOUS | Status: DC

## 2021-03-03 MED ORDER — LIDOCAINE HCL (CARDIAC) PF 100 MG/5ML IV SOSY
PREFILLED_SYRINGE | INTRAVENOUS | Status: DC | PRN
  Administered 2021-03-03: 50 mg via INTRAVENOUS

## 2021-03-03 MED ORDER — LEVOTHYROXINE SODIUM 25 MCG PO TABS: 25.0000 ug | ORAL_TABLET | ORAL | Status: DC

## 2021-03-03 MED ORDER — BISACODYL 10 MG RE SUPP: 10.0000 mg | Freq: Every day | RECTAL | Status: DC | PRN

## 2021-03-03 MED ORDER — DEXAMETHASONE SODIUM PHOSPHATE 4 MG/ML IJ SOLN
INTRAMUSCULAR | Status: DC | PRN
  Administered 2021-03-03: 4 mg via INTRAVENOUS

## 2021-03-03 MED ORDER — BUPIVACAINE HCL (PF) 0.5 % IJ SOLN
INTRAMUSCULAR | Status: DC | PRN
  Administered 2021-03-03: 2 mL

## 2021-03-03 MED ORDER — ESCITALOPRAM OXALATE 5 MG PO TABS
5.0000 mg | ORAL_TABLET | Freq: Every day | ORAL | Status: DC
  Filled 2021-03-03: qty 1

## 2021-03-03 MED ORDER — FAMOTIDINE 20 MG PO TABS: 40.0000 mg | ORAL_TABLET | Freq: Every day | ORAL | Status: DC

## 2021-03-03 MED ORDER — MORPHINE SULFATE (PF) 2 MG/ML IV SOLN: 2.0000 mg | INTRAVENOUS | Status: DC | PRN

## 2021-03-03 MED ORDER — PROPOFOL 10 MG/ML IV BOLUS
INTRAVENOUS | Status: AC
  Filled 2021-03-03: qty 20

## 2021-03-03 MED ORDER — PHENYLEPHRINE HCL-NACL 10-0.9 MG/250ML-% IV SOLN
INTRAVENOUS | Status: DC | PRN
  Administered 2021-03-03: 20 ug/min via INTRAVENOUS

## 2021-03-03 MED ORDER — FENTANYL CITRATE (PF) 100 MCG/2ML IJ SOLN
INTRAMUSCULAR | Status: AC
  Filled 2021-03-03: qty 2

## 2021-03-03 MED ORDER — ONDANSETRON 4 MG PO TBDP: 4.0000 mg | ORAL_TABLET | Freq: Two times a day (BID) | ORAL | Status: DC | PRN

## 2021-03-03 MED ORDER — SUGAMMADEX SODIUM 200 MG/2ML IV SOLN
INTRAVENOUS | Status: DC | PRN
  Administered 2021-03-03: 400 mg via INTRAVENOUS

## 2021-03-03 MED ORDER — LACTATED RINGERS IV SOLN: INTRAVENOUS | Status: DC | PRN

## 2021-03-03 MED ORDER — METHOCARBAMOL 500 MG PO TABS: 500.0000 mg | ORAL_TABLET | Freq: Four times a day (QID) | ORAL | Status: DC | PRN

## 2021-03-03 MED ORDER — LIDOCAINE-EPINEPHRINE 1 %-1:100000 IJ SOLN
INTRAMUSCULAR | Status: AC
  Filled 2021-03-03: qty 1

## 2021-03-03 MED ORDER — PHENOL 1.4 % MT LIQD
1.0000 | OROMUCOSAL | Status: DC | PRN
  Administered 2021-03-03: 1 via OROMUCOSAL
  Filled 2021-03-03: qty 177

## 2021-03-03 MED ORDER — SODIUM CHLORIDE 0.9% FLUSH: 3.0000 mL | Freq: Two times a day (BID) | INTRAVENOUS | Status: DC

## 2021-03-03 MED ORDER — HYDROCODONE-ACETAMINOPHEN 5-325 MG PO TABS: 1.0000 | ORAL_TABLET | Freq: Three times a day (TID) | ORAL | Status: DC

## 2021-03-03 MED ORDER — ROCURONIUM BROMIDE 100 MG/10ML IV SOLN
INTRAVENOUS | Status: DC | PRN
  Administered 2021-03-03: 50 mg via INTRAVENOUS

## 2021-03-03 MED ORDER — METHOCARBAMOL 1000 MG/10ML IJ SOLN
500.0000 mg | Freq: Four times a day (QID) | INTRAVENOUS | Status: DC | PRN
  Filled 2021-03-03: qty 5

## 2021-03-03 MED ORDER — AMLODIPINE BESYLATE 2.5 MG PO TABS
2.5000 mg | ORAL_TABLET | Freq: Every day | ORAL | Status: DC
  Filled 2021-03-03: qty 1

## 2021-03-03 MED ORDER — SODIUM CHLORIDE 0.9% FLUSH: 3.0000 mL | INTRAVENOUS | Status: DC | PRN

## 2021-03-03 MED ORDER — IOPAMIDOL (ISOVUE-300) INJECTION 61%
INTRAVENOUS | Status: DC | PRN
  Administered 2021-03-03: 50 mL

## 2021-03-03 MED ORDER — BACLOFEN 10 MG PO TABS: 10.0000 mg | ORAL_TABLET | Freq: Three times a day (TID) | ORAL | Status: DC

## 2021-03-03 MED ORDER — BUPIVACAINE HCL (PF) 0.5 % IJ SOLN
INTRAMUSCULAR | Status: AC
  Filled 2021-03-03: qty 30

## 2021-03-03 MED ORDER — FENTANYL CITRATE (PF) 100 MCG/2ML IJ SOLN
INTRAMUSCULAR | Status: DC | PRN
  Administered 2021-03-03: 100 ug via INTRAVENOUS

## 2021-03-03 MED ORDER — ALUM & MAG HYDROXIDE-SIMETH 200-200-20 MG/5ML PO SUSP: 30.0000 mL | Freq: Four times a day (QID) | ORAL | Status: DC | PRN

## 2021-03-03 MED ORDER — GABAPENTIN 600 MG PO TABS: 600.0000 mg | ORAL_TABLET | Freq: Three times a day (TID) | ORAL | Status: DC

## 2021-03-03 MED ORDER — FENTANYL CITRATE (PF) 250 MCG/5ML IJ SOLN
INTRAMUSCULAR | Status: AC
  Filled 2021-03-03: qty 5

## 2021-03-03 MED ORDER — ORAL CARE MOUTH RINSE: 15.0000 mL | Freq: Once | OROMUCOSAL | Status: AC

## 2021-03-03 MED ORDER — LIDOCAINE-EPINEPHRINE 1 %-1:100000 IJ SOLN
INTRAMUSCULAR | Status: DC | PRN
  Administered 2021-03-03: 2 mL

## 2021-03-03 MED ORDER — ACETAMINOPHEN 325 MG PO TABS
650.0000 mg | ORAL_TABLET | ORAL | Status: DC | PRN
Start: 2021-03-03 — End: 2021-03-04

## 2021-03-03 MED ORDER — ONDANSETRON HCL 4 MG/2ML IJ SOLN
INTRAMUSCULAR | Status: DC | PRN
  Administered 2021-03-03: 4 mg via INTRAVENOUS

## 2021-03-03 MED ORDER — SIMVASTATIN 20 MG PO TABS: 20.0000 mg | ORAL_TABLET | Freq: Every day | ORAL | Status: DC

## 2021-03-03 MED ORDER — LOSARTAN POTASSIUM 50 MG PO TABS: 50.0000 mg | ORAL_TABLET | Freq: Two times a day (BID) | ORAL | Status: DC

## 2021-03-03 MED ORDER — LACTATED RINGERS IV SOLN: INTRAVENOUS | Status: DC

## 2021-03-03 MED ORDER — FENTANYL CITRATE (PF) 100 MCG/2ML IJ SOLN
25.0000 ug | INTRAMUSCULAR | Status: DC | PRN
  Administered 2021-03-03: 50 ug via INTRAVENOUS

## 2021-03-03 SURGICAL SUPPLY — 38 items
BLADE CLIPPER SURG (BLADE) IMPLANT
BLADE SURG 11 STRL SS (BLADE) IMPLANT
BNDG ADH 1X3 SHEER STRL LF (GAUZE/BANDAGES/DRESSINGS) IMPLANT
CEMENT KYPHON C01A KIT/MIXER (Cement) ×2 IMPLANT
CONT SPEC 4OZ CLIKSEAL STRL BL (MISCELLANEOUS) IMPLANT
COVER WAND RF STERILE (DRAPES) IMPLANT
DECANTER SPIKE VIAL GLASS SM (MISCELLANEOUS) ×4 IMPLANT
DERMABOND ADVANCED (GAUZE/BANDAGES/DRESSINGS) ×1
DERMABOND ADVANCED .7 DNX12 (GAUZE/BANDAGES/DRESSINGS) ×1 IMPLANT
DRAPE C-ARM 42X72 X-RAY (DRAPES) ×2 IMPLANT
DRAPE HALF SHEET 40X57 (DRAPES) IMPLANT
DRAPE INCISE IOBAN 66X45 STRL (DRAPES) IMPLANT
DRAPE LAPAROTOMY 100X72X124 (DRAPES) ×2 IMPLANT
DRAPE WARM FLUID 44X44 (DRAPES) ×2 IMPLANT
DURAPREP 26ML APPLICATOR (WOUND CARE) ×2 IMPLANT
GAUZE 4X4 16PLY RFD (DISPOSABLE) ×2 IMPLANT
GLOVE BIOGEL PI IND STRL 8.5 (GLOVE) ×1 IMPLANT
GLOVE BIOGEL PI INDICATOR 8.5 (GLOVE) ×1
GLOVE ECLIPSE 8.5 STRL (GLOVE) ×2 IMPLANT
GLOVE EXAM NITRILE XL STR (GLOVE) IMPLANT
GOWN STRL REUS W/ TWL LRG LVL3 (GOWN DISPOSABLE) ×1 IMPLANT
GOWN STRL REUS W/ TWL XL LVL3 (GOWN DISPOSABLE) IMPLANT
GOWN STRL REUS W/TWL 2XL LVL3 (GOWN DISPOSABLE) ×2 IMPLANT
GOWN STRL REUS W/TWL LRG LVL3 (GOWN DISPOSABLE) ×2
GOWN STRL REUS W/TWL XL LVL3 (GOWN DISPOSABLE)
KIT BASIN OR (CUSTOM PROCEDURE TRAY) ×2 IMPLANT
KIT TURNOVER KIT B (KITS) ×2 IMPLANT
NEEDLE HYPO 25X1 1.5 SAFETY (NEEDLE) ×2 IMPLANT
NS IRRIG 1000ML POUR BTL (IV SOLUTION) ×2 IMPLANT
PACK LAMINECTOMY NEURO (CUSTOM PROCEDURE TRAY) ×2 IMPLANT
PACK SURGICAL SETUP 50X90 (CUSTOM PROCEDURE TRAY) IMPLANT
PAD ARMBOARD 7.5X6 YLW CONV (MISCELLANEOUS) ×6 IMPLANT
SPECIMEN JAR SMALL (MISCELLANEOUS) IMPLANT
SUT VICRYL RAPIDE 4/0 PS 2 (SUTURE) IMPLANT
SYR CONTROL 10ML LL (SYRINGE) IMPLANT
TOWEL GREEN STERILE (TOWEL DISPOSABLE) ×2 IMPLANT
TOWEL GREEN STERILE FF (TOWEL DISPOSABLE) ×2 IMPLANT
TRAY KYPHOPAK 15/3 ONESTEP 1ST (MISCELLANEOUS) ×2 IMPLANT

## 2021-03-03 NOTE — Progress Notes (Signed)
Discharged instructions/education/AVS/Rx given to patient and verbalized understanding no other questions noted. Patient voiding and emptying bladder well. Complained of sore throat but no problem with swallowing, finished her dinner with no issue. Ambulated well in the hallway with cane and NT supervision. No swelling, no redness, no drainage noted on incision site. Patient awaiting for her transport.

## 2021-03-03 NOTE — H&P (Addendum)
[name] is an 75 y.o. female.   Chief Complaint: Thoracic back pain HPI: [name] is a 75 year old individual who has had extensive spinal decompression arthrodesis across the thoracolumbar junction.  As she has evidence of a T7 compression fracture that is subacute she has had pain radiating in the subcostal margins and also around to the anterior border of the inferior chest wall.  Is been worked up multiply and the MRI that now demonstrates a subacute fracture of the T7 vertebrae's is is suspicious for this creating the problem.  She has been advised regarding acrylic balloon kyphoplasty and is now admitted for that procedure.  Past Medical History:  Diagnosis Date  . Anemia   . Arthritis    osteoarthritis  . Claustrophobia   . Complication of anesthesia    hard time waking up after her gallbladder surgery 28 years ago  . DVT (deep venous thrombosis) (HCC)    left leg  . GERD (gastroesophageal reflux disease)    hx of  . Heart murmur    Dr. Katrinka Blazing follows, 'nothing to worry about.'  . History of blood transfusion   . History of hiatal hernia    small  . History of kidney stones   . Hypercholesteremia    under control  . Hypertension   . Hypothyroidism   . Mild aortic stenosis    a. by echo 12/2017.  Marland Kitchen Panic attack   . PONV (postoperative nausea and vomiting)   . Pulmonary embolism (HCC)    bilateral lungs - 2008 after disc surgery    Past Surgical History:  Procedure Laterality Date  . ABDOMINAL HYSTERECTOMY  1975   partial  . APPENDECTOMY  1973  . BACK SURGERY     X14 1987-2009  . CARDIAC CATHETERIZATION  ~2010  . CARPAL TUNNEL RELEASE Bilateral   . CHOLECYSTECTOMY  1997  . COLONOSCOPY    . COLONOSCOPY WITH PROPOFOL N/A 12/05/2017   Procedure: COLONOSCOPY WITH PROPOFOL;  Surgeon: Carman Ching, MD;  Location: [facility];  Service: Endoscopy;  Laterality: N/A;  . DILATION AND CURETTAGE OF UTERUS    . ENDARTERECTOMY Right 08/10/2013   Procedure:  Excision of Venous Aneurysm Right Neck;  Surgeon: Larina Earthly, MD;  Location: [facility] OR;  Service: Vascular;  Laterality: Right;  . ESOPHAGOGASTRODUODENOSCOPY (EGD) WITH PROPOFOL N/A 12/05/2017   Procedure: ESOPHAGOGASTRODUODENOSCOPY (EGD) WITH PROPOFOL;  Surgeon: Carman Ching, MD;  Location: [facility];  Service: Endoscopy;  Laterality: N/A;  . filter Left 2009   IVC  . HIP SURGERY Left 2008  . JOINT REPLACEMENT Right 11/2003   (knee)multiple surgeries  . JOINT REPLACEMENT Left 12/2009   (knee)  . JOINT REPLACEMENT Left 2011, 2012   Hip  . MASS EXCISION  1974   uterus  . QUADRICEPS TENDON REPAIR Right 12/06/2016   Procedure: QUAD TENDON RECONSTRUCTION;  Surgeon: Durene Romans, MD;  Location: [facility];  Service: Orthopedics;  Laterality: Right;  Requests for 90 mins  . RADIOLOGY WITH ANESTHESIA N/A 01/20/2021   Procedure: MRI WITH [facility] PAIN WITHOUT CONTRAST;  Surgeon: Radiologist, Medication, MD;  Location: [facility] OR;  Service: Radiology;  Laterality: N/A;  . TONSILLECTOMY  1986  . TOTAL HIP REVISION Left 12/07/2013   Procedure:  REVISION  CONSTRAINED LINER LEFT TOTAL HIP ;  Surgeon: Shelda Pal, MD;  Location: [facility];  Service: Orthopedics;  Laterality: Left;  . TOTAL SHOULDER REPLACEMENT Right 11/30/2010  . TUBAL LIGATION  1973    Family History  Problem Relation  Age of Onset  . Diabetes Mother   . Heart disease Mother        before age 62 - ?thick blood - pt does not know if patient had formal heart issue but heart stopped  . Hyperlipidemia Mother   . Hypertension Mother   . Diabetes Father   . Hyperlipidemia Father   . Hypertension Father   . Heart attack Father        in his 41s during adm for flu  . Peripheral vascular disease Father   . Other Father        amputation  . Diabetes Sister   . Hyperlipidemia Sister   . Hypertension Sister   . Deep vein thrombosis Brother   . Hyperlipidemia Brother   . Hypertension Brother    Social History:  reports that she has never  smoked. She has never used smokeless tobacco. She reports that she does not drink alcohol and does not use drugs.  Allergies:  Allergies  Allergen Reactions  . Celecoxib Rash    Medications Prior to Admission  Medication Sig Dispense Refill  . ALPRAZolam (XANAX) 0.5 MG tablet Take 0.5 mg by mouth 2 (two) times daily.    Marland Kitchen amLODipine (NORVASC) 5 MG tablet Take 2.5 mg by mouth at bedtime.    Marland Kitchen atenolol (TENORMIN) 25 MG tablet Take 25 mg by mouth See admin instructions. Take one tablet (25 mg) by mouth every morning, may also take a 2nd tablet (25 mg) if SBP >150    . baclofen (LIORESAL) 10 MG tablet Take 1 tablet (10 mg total) by mouth every 8 (eight) hours. 30 each 0  . escitalopram (LEXAPRO) 5 MG tablet Take 5 mg by mouth at bedtime.    . famotidine (PEPCID) 40 MG tablet Take 40 mg by mouth at bedtime.  3  . furosemide (LASIX) 20 MG tablet Take 20 mg by mouth See admin instructions. Take one tablet (20 mg) by mouth Monday through Friday    . gabapentin (NEURONTIN) 600 MG tablet Take 600 mg by mouth 3 (three) times daily.    Marland Kitchen HYDROcodone-acetaminophen (NORCO/VICODIN) 5-325 MG tablet Take 1 tablet by mouth 3 (three) times daily.    Marland Kitchen levothyroxine (SYNTHROID, LEVOTHROID) 50 MCG tablet Take 25 mcg by mouth every Monday, Wednesday, and Friday.  1  . losartan (COZAAR) 50 MG tablet Take 50 mg by mouth 2 (two) times daily.    Marland Kitchen OVER THE COUNTER MEDICATION Take 2 tablets by mouth daily. Multivitamin plus Omega 3 supplement    . OVER THE COUNTER MEDICATION Apply 1 application topically 3 (three) times daily as needed (joint pain). [place] Dream Cream- Muscle Spasms    . Polyethyl Glycol-Propyl Glycol (SYSTANE OP) Place 1-2 drops into both eyes at bedtime as needed (for dry eyes).    . Probiotic Product (PROBIOTIC PO) Take 1 capsule by mouth daily.    . Rivaroxaban (XARELTO) 20 MG TABS tablet Take 20 mg by mouth daily at 3 pm.     . simvastatin (ZOCOR) 20 MG tablet Take 20 mg by mouth at bedtime.     . fluticasone (FLONASE) 50 MCG/ACT nasal spray Place 2 sprays into both nostrils daily as needed for allergies.    Marland Kitchen ondansetron (ZOFRAN-ODT) 4 MG disintegrating tablet Take 4 mg by mouth 2 (two) times daily as needed for nausea or vomiting.  3    Results for orders placed or performed during the hospital encounter of 03/02/21 (from the past 48 hour(s))  SARS  CORONAVIRUS 2 (TAT 6-24 HRS) Nasopharyngeal Nasopharyngeal Swab     Status: None   Collection Time: 03/02/21 11:05 AM   Specimen: Nasopharyngeal Swab  Result Value Ref Range   SARS Coronavirus 2 NEGATIVE NEGATIVE    Comment: (NOTE) SARS-CoV-2 target nucleic acids are NOT DETECTED.  The SARS-CoV-2 RNA is generally detectable in upper and lower respiratory specimens during the acute phase of infection. Negative results do not preclude SARS-CoV-2 infection, do not rule out co-infections with other pathogens, and should not be used as the sole basis for treatment or other patient management decisions. Negative results must be combined with clinical observations, patient history, and epidemiological information. The expected result is Negative.  Fact Sheet for Patients: HairSlick.no  Fact Sheet for Healthcare Providers: quierodirigir.com  This test is not yet approved or cleared by the [place] FDA and  has been authorized for detection and/or diagnosis of SARS-CoV-2 by FDA under an Emergency Use Authorization (EUA). This EUA will remain  in effect (meaning this test can be used) for the duration of the COVID-19 declaration under Se ction 564(b)(1) of the Act, 21 U.S.C. section 360bbb-3(b)(1), unless the authorization is terminated or revoked sooner.  Performed at [facility], [address] [place], [place] [address]    No results found.  Review of Systems  Constitutional: Positive for activity change.  HENT: Negative.   Eyes: Negative.   Respiratory:  Negative.   Cardiovascular: Negative.   Gastrointestinal: Negative.   Endocrine: Negative.   Genitourinary: Negative.   Musculoskeletal: Positive for back pain and gait problem.  Allergic/Immunologic: Negative.   Neurological: Positive for weakness and numbness.  Hematological: Negative.   Psychiatric/Behavioral: Negative.     Blood pressure (!) 170/55, pulse 61, temperature (!) 97.5 F (36.4 C), temperature source Oral, resp. rate 18, SpO2 98 %. Physical Exam Constitutional:      Appearance: Normal appearance. She is obese.  HENT:     Head: Normocephalic and atraumatic.     Nose: Nose normal.     Mouth/Throat:     Mouth: Mucous membranes are moist.  Eyes:     Extraocular Movements: Extraocular movements intact.     Pupils: Pupils are equal, round, and reactive to light.  Cardiovascular:     Rate and Rhythm: Regular rhythm.     Pulses: Normal pulses.     Heart sounds: Normal heart sounds.  Abdominal:     General: Abdomen is flat.     Palpations: Abdomen is soft.  Musculoskeletal:     Cervical back: Normal range of motion and neck supple.     Comments: Mild tenderness to palpation across the thoracic spine  Skin:    General: Skin is warm and dry.     Capillary Refill: Capillary refill takes less than 2 seconds.  Neurological:     Mental Status: She is alert.     Comments: Alert and oriented motor function upper and lower extremities reveals 4 out of 5 strength in the major muscle groups no patellar reflexes can be elicited no Achilles reflexes can be elicited but lower extremity motor function appears intact and so as the patient can ambulate.  Cranial nerve examination is normal.      Assessment/Plan T7 subacute compression fracture with delayed healing.  Plan T7 acrylic balloon kyphoplasty.  Stefani Dama, MD 03/03/2021, 5:31 PM

## 2021-03-03 NOTE — Op Note (Addendum)
Date of surgery: 03/03/2021 Preoperative diagnosis: T7 compression fracture with delayed healing.  History of fusion T8 to the sacrum Postoperative diagnosis: Same Procedure: T7 acrylic balloon kyphoplasty Surgeon: Barnett Abu Anesthesia: General endotracheal Indications: [name] is a 75 year old individuals had significant spinal surgery with a fusion from T8 to the sacrum.  She had recently developed increasing back pain around the thoracic spine with radiation towards the lower portion of sternum.  She was found to have a T7 compression fracture with acute kyphosis at that level.  She has now been admitted for acrylic balloon kyphoplasty of T7 vertebrae.  Procedure: Patient was brought to the operating room supine on the stretcher.  After the smooth induction of general tracheal anesthesia she was carefully turned prone onto the operating table bony prominences were appropriately padded and protected.  AP and lateral fluoroscopy was used to visualize the T7 vertebrae simultaneously.  Then choosing an area lateral to the T7 vertebrae skin was infiltrated with 4 cc of lidocaine with epinephrine a small stab incision was made with a 15 blade and a Jamshidi needle was inserted into this area and directed towards the superior lateral portion of the pedicle of T7.  The pedicle bone was entered and then by carefully guiding the bone through the pedicle into the vertebral body the center cannula was removed and a drill was inserted.  The center core of the vertebral body was drilled and a balloon was inserted and inflated to pressure.  The balloon created a small cavity in the vertebral body of T7.  This was then removed after deflation and 3 cc of methylmethacrylate kyphoplasty cement was injected under fluoroscopic visualization.  This provided good filling of the central portion of the vertebrae.  Once completed the center cannula was replaced and the Jamshidi needle was removed.  A singular 4-0  Vicryl suture was used to close the skin.  The patient was returned to recovery room in stable condition blood loss for the procedure was nil.

## 2021-03-03 NOTE — Discharge Summary (Signed)
Physician Discharge Summary  Patient ID: [name] MRN: [id number] DOB/AGE: [birth year] 75 y.o.  Admit date: 03/03/2021 Discharge date: 03/03/2021  Admission Diagnoses:T7 compression fracture  Discharge Diagnoses T7 comptression fracture Active Problems:   Compression fracture of body of thoracic vertebra [facility])   Discharged Condition: good  Hospital Course: good  Consults: None  Significant Diagnostic Studies: none  Treatments: surgery:  See op note  Discharge Exam: Blood pressure (!) 154/68, pulse (!) 51, temperature 97.7 F (36.5 C), temperature source Oral, resp. rate 18, SpO2 94 %. incision clean and dry . station and gait normal  Disposition: Discharge disposition: 01-Home or Self Care       Discharge Instructions    Call MD for:  redness, tenderness, or signs of infection (pain, swelling, redness, odor or green/yellow discharge around incision site)   Complete by: As directed    Call MD for:  severe uncontrolled pain   Complete by: As directed    Call MD for:  temperature >100.4   Complete by: As directed    Diet - low sodium heart healthy   Complete by: As directed    Incentive spirometry RT   Complete by: As directed    Increase activity slowly   Complete by: As directed    No wound care   Complete by: As directed      Allergies as of 03/03/2021      Reactions   Celecoxib Rash      Medication List    TAKE these medications   ALPRAZolam 0.5 MG tablet Commonly known as: XANAX Take 0.5 mg by mouth 2 (two) times daily.   amLODipine 5 MG tablet Commonly known as: NORVASC Take 2.5 mg by mouth at bedtime.   atenolol 25 MG tablet Commonly known as: TENORMIN Take 25 mg by mouth See admin instructions. Take one tablet (25 mg) by mouth every morning, may also take a 2nd tablet (25 mg) if SBP >150   baclofen 10 MG tablet Commonly known as: LIORESAL Take 1 tablet (10 mg total) by mouth every 8 (eight) hours.   escitalopram 5 MG  tablet Commonly known as: LEXAPRO Take 5 mg by mouth at bedtime.   famotidine 40 MG tablet Commonly known as: PEPCID Take 40 mg by mouth at bedtime.   fluticasone 50 MCG/ACT nasal spray Commonly known as: FLONASE Place 2 sprays into both nostrils daily as needed for allergies.   furosemide 20 MG tablet Commonly known as: LASIX Take 20 mg by mouth See admin instructions. Take one tablet (20 mg) by mouth Monday through Friday   gabapentin 600 MG tablet Commonly known as: NEURONTIN Take 600 mg by mouth 3 (three) times daily.   HYDROcodone-acetaminophen 5-325 MG tablet Commonly known as: NORCO/VICODIN Take 1 tablet by mouth 3 (three) times daily.   levothyroxine 50 MCG tablet Commonly known as: SYNTHROID Take 25 mcg by mouth every Monday, Wednesday, and Friday.   losartan 50 MG tablet Commonly known as: COZAAR Take 50 mg by mouth 2 (two) times daily.   ondansetron 4 MG disintegrating tablet Commonly known as: ZOFRAN-ODT Take 4 mg by mouth 2 (two) times daily as needed for nausea or vomiting.   OVER THE COUNTER MEDICATION Take 2 tablets by mouth daily. Multivitamin plus Omega 3 supplement   OVER THE COUNTER MEDICATION Apply 1 application topically 3 (three) times daily as needed (joint pain). [place] Dream Cream- Muscle Spasms   PROBIOTIC PO Take 1 capsule by mouth daily.   rivaroxaban 20 MG  Tabs tablet Commonly known as: XARELTO Take 20 mg by mouth daily at 3 pm.   simvastatin 20 MG tablet Commonly known as: ZOCOR Take 20 mg by mouth at bedtime.   SYSTANE OP Place 1-2 drops into both eyes at bedtime as needed (for dry eyes).        Signed: Shary Key Dawnette Mione 03/03/2021, 9:05 PM

## 2021-03-03 NOTE — Anesthesia Postprocedure Evaluation (Signed)
Anesthesia Post Note  Patient: [name]  Procedure(s) Performed: Thoracic seven Kyphoplasty (N/A Back)     Patient location during evaluation: PACU Anesthesia Type: General Level of consciousness: awake Pain management: pain level controlled Vital Signs Assessment: post-procedure vital signs reviewed and stable Respiratory status: spontaneous breathing Cardiovascular status: stable Postop Assessment: no apparent nausea or vomiting Anesthetic complications: no   No complications documented.  Last Vitals:  Vitals:   03/03/21 1923 03/03/21 1956  BP: (!) 147/68 (!) 154/68  Pulse: 60 (!) 51  Resp: (!) 6 18  Temp:  36.5 C  SpO2: 93% 94%    Last Pain:  Vitals:   03/03/21 1956  TempSrc: Oral  PainSc:                  [name]

## 2021-03-03 NOTE — Anesthesia Procedure Notes (Signed)
Procedure Name: Intubation Date/Time: 03/03/2021 5:56 PM Performed by: Adria Dill, CRNA Pre-anesthesia Checklist: Patient identified, Emergency Drugs available, Suction available and Patient being monitored Patient Re-evaluated:Patient Re-evaluated prior to induction Oxygen Delivery Method: Circle system utilized Preoxygenation: Pre-oxygenation with 100% oxygen Induction Type: IV induction Ventilation: Mask ventilation without difficulty Laryngoscope Size: Miller and 2 Grade View: Grade I Tube type: Oral Tube size: 7.0 mm Number of attempts: 1 Airway Equipment and Method: Stylet Placement Confirmation: ETT inserted through vocal cords under direct vision,  positive ETCO2 and breath sounds checked- equal and bilateral Secured at: 22 cm Tube secured with: Tape Dental Injury: Teeth and Oropharynx as per pre-operative assessment

## 2021-03-03 NOTE — Transfer of Care (Addendum)
Immediate Anesthesia Transfer of Care Note  Patient: [name]  Procedure(s) Performed: Thoracic seven Kyphoplasty (N/A Back)  Patient Location: PACU  Anesthesia Type:General  Level of Consciousness: awake, alert , oriented and patient cooperative  Airway & Oxygen Therapy: Patient Spontanous Breathing  Post-op Assessment: Report given to RN and Post -op Vital signs reviewed and stable  Post vital signs: Reviewed and stable  Last Vitals:  Vitals Value Taken Time  BP    Temp    Pulse 70 03/03/21 1852  Resp 10 03/03/21 1852  SpO2 88 % 03/03/21 1852  Vitals shown include unvalidated device data.  Last Pain:  Vitals:   03/03/21 1357  TempSrc: Oral  PainSc: 0-No pain         Complications: No complications documented.

## 2021-03-04 ENCOUNTER — Encounter (HOSPITAL_COMMUNITY): Payer: Self-pay | Admitting: Neurological Surgery

## 2021-03-20 DIAGNOSIS — S22060A Wedge compression fracture of T7-T8 vertebra, initial encounter for closed fracture: Secondary | ICD-10-CM | POA: Diagnosis not present

## 2021-03-24 DIAGNOSIS — R0981 Nasal congestion: Secondary | ICD-10-CM | POA: Diagnosis not present

## 2021-03-24 DIAGNOSIS — J069 Acute upper respiratory infection, unspecified: Secondary | ICD-10-CM | POA: Diagnosis not present

## 2021-03-24 DIAGNOSIS — J029 Acute pharyngitis, unspecified: Secondary | ICD-10-CM | POA: Diagnosis not present

## 2021-03-24 DIAGNOSIS — I10 Essential (primary) hypertension: Secondary | ICD-10-CM | POA: Diagnosis not present

## 2021-03-24 DIAGNOSIS — Z1152 Encounter for screening for COVID-19: Secondary | ICD-10-CM | POA: Diagnosis not present

## 2021-03-26 DIAGNOSIS — R109 Unspecified abdominal pain: Secondary | ICD-10-CM | POA: Diagnosis not present

## 2021-03-27 ENCOUNTER — Other Ambulatory Visit: Payer: Self-pay | Admitting: Neurological Surgery

## 2021-03-30 ENCOUNTER — Other Ambulatory Visit (HOSPITAL_COMMUNITY)
Admission: RE | Admit: 2021-03-30 | Discharge: 2021-03-30 | Disposition: A | Discharge status: Home / Self Care | Payer: PPO | Source: Ambulatory Visit | Attending: Neurological Surgery | Admitting: Neurological Surgery

## 2021-03-30 ENCOUNTER — Encounter (HOSPITAL_COMMUNITY): Payer: Self-pay | Admitting: Neurological Surgery

## 2021-03-30 DIAGNOSIS — Z01812 Encounter for preprocedural laboratory examination: Secondary | ICD-10-CM | POA: Diagnosis not present

## 2021-03-30 DIAGNOSIS — Z20822 Contact with and (suspected) exposure to covid-19: Secondary | ICD-10-CM | POA: Diagnosis not present

## 2021-03-30 LAB — SARS CORONAVIRUS 2 (TAT 6-24 HRS): SARS Coronavirus 2: NEGATIVE

## 2021-03-30 NOTE — Anesthesia Preprocedure Evaluation (Addendum)
Anesthesia Evaluation  Patient identified by MRN, date of birth, ID band Patient awake    Reviewed: Allergy & Precautions, NPO status , Patient's Chart, lab work & pertinent test results  History of Anesthesia Complications (+) PONV  Airway Mallampati: II  TM Distance: >3 FB     Dental   Pulmonary neg pulmonary ROS,    breath sounds clear to auscultation       Cardiovascular hypertension, + CAD  + Valvular Problems/Murmurs  Rhythm:Regular Rate:Normal     Neuro/Psych Anxiety negative neurological ROS  negative psych ROS   GI/Hepatic Neg liver ROS, hiatal hernia, GERD  ,  Endo/Other  Hypothyroidism   Renal/GU Renal disease     Musculoskeletal  (+) Arthritis ,   Abdominal   Peds  Hematology  (+) anemia ,   Anesthesia Other Findings   Reproductive/Obstetrics                            Anesthesia Physical Anesthesia Plan  ASA: 3  Anesthesia Plan: General   Post-op Pain Management:    Induction: Intravenous  PONV Risk Score and Plan: 4 or greater and Ondansetron, Dexamethasone and Midazolam  Airway Management Planned: Oral ETT  Additional Equipment:   Intra-op Plan:   Post-operative Plan: Possible Post-op intubation/ventilation  Informed Consent: I have reviewed the patients History and Physical, chart, labs and discussed the procedure including the risks, benefits and alternatives for the proposed anesthesia with the patient or authorized representative who has indicated his/her understanding and acceptance.     Dental advisory given  Plan Discussed with: CRNA and Anesthesiologist  Anesthesia Plan Comments: (See recent PAT note  by Antionette Poles, PA-C 02/20/21. Underwent T7 kyphoplasty 03/03/21 without complication. No interim health changes noted.  )       Anesthesia Quick Evaluation

## 2021-03-30 NOTE — Progress Notes (Signed)
PCP - Dr. Creola Corn Cardiologist - Dr. Verdis Prime GI - Dr. Dulce Sellar   Chest x-ray - n/a EKG - 02/20/21 Stress Test - 2017 ECHO - 01/12/2018 Cardiac Cath - 01/17/2008  Sleep Study - denies  Patient denies being a diabetic  Blood Thinner Instructions:  Pt. States that she took her last dose of Xarelto on 03/28/21 Aspirin Instructions: n/a  ERAS Protcol - n/a PRE-SURGERY Ensure or G2- n/a  COVID TEST- 03/30/21 - pt. States she is going today    Anesthesia review: yes  Patient denies shortness of breath, fever, cough and chest pain at PAT appointment  Pt. States that she is taking cefdinir for a sinus infection.  Pt. Informed nurse that her son and daughter-in-law currently have covid but she has not been around them and she has tested negative on a home test.  Patient is going for pre-op covid test today.     All instructions explained to the patient, with a verbal understanding of the material. The opportunity to ask questions was provided.

## 2021-03-31 ENCOUNTER — Ambulatory Visit (HOSPITAL_COMMUNITY): Payer: PPO | Admitting: Physician Assistant

## 2021-03-31 ENCOUNTER — Encounter (HOSPITAL_COMMUNITY): Payer: Self-pay | Admitting: Neurological Surgery

## 2021-03-31 ENCOUNTER — Other Ambulatory Visit: Payer: Self-pay

## 2021-03-31 ENCOUNTER — Ambulatory Visit (HOSPITAL_COMMUNITY): Payer: PPO

## 2021-03-31 ENCOUNTER — Encounter (HOSPITAL_COMMUNITY)
Admission: RE | Disposition: A | Discharge status: Home / Self Care | Payer: Self-pay | Source: Home / Self Care | Attending: Neurological Surgery

## 2021-03-31 ENCOUNTER — Ambulatory Visit (HOSPITAL_COMMUNITY)
Admission: RE | Admit: 2021-03-31 | Discharge: 2021-04-01 | Disposition: A | Discharge status: Home / Self Care | Payer: PPO | Attending: Neurological Surgery | Admitting: Neurological Surgery

## 2021-03-31 DIAGNOSIS — Z79890 Hormone replacement therapy (postmenopausal): Secondary | ICD-10-CM | POA: Insufficient documentation

## 2021-03-31 DIAGNOSIS — M4854XA Collapsed vertebra, not elsewhere classified, thoracic region, initial encounter for fracture: Secondary | ICD-10-CM | POA: Diagnosis not present

## 2021-03-31 DIAGNOSIS — Z96642 Presence of left artificial hip joint: Secondary | ICD-10-CM | POA: Insufficient documentation

## 2021-03-31 DIAGNOSIS — E039 Hypothyroidism, unspecified: Secondary | ICD-10-CM | POA: Diagnosis not present

## 2021-03-31 DIAGNOSIS — K449 Diaphragmatic hernia without obstruction or gangrene: Secondary | ICD-10-CM | POA: Diagnosis not present

## 2021-03-31 DIAGNOSIS — Z79899 Other long term (current) drug therapy: Secondary | ICD-10-CM | POA: Insufficient documentation

## 2021-03-31 DIAGNOSIS — E785 Hyperlipidemia, unspecified: Secondary | ICD-10-CM | POA: Diagnosis not present

## 2021-03-31 DIAGNOSIS — Z7901 Long term (current) use of anticoagulants: Secondary | ICD-10-CM | POA: Diagnosis not present

## 2021-03-31 DIAGNOSIS — Z86718 Personal history of other venous thrombosis and embolism: Secondary | ICD-10-CM | POA: Diagnosis not present

## 2021-03-31 DIAGNOSIS — S22069A Unspecified fracture of T7-T8 vertebra, initial encounter for closed fracture: Secondary | ICD-10-CM | POA: Diagnosis not present

## 2021-03-31 DIAGNOSIS — J9811 Atelectasis: Secondary | ICD-10-CM | POA: Diagnosis not present

## 2021-03-31 DIAGNOSIS — M8008XA Age-related osteoporosis with current pathological fracture, vertebra(e), initial encounter for fracture: Secondary | ICD-10-CM | POA: Insufficient documentation

## 2021-03-31 DIAGNOSIS — Z96653 Presence of artificial knee joint, bilateral: Secondary | ICD-10-CM | POA: Diagnosis not present

## 2021-03-31 DIAGNOSIS — Z86711 Personal history of pulmonary embolism: Secondary | ICD-10-CM | POA: Diagnosis not present

## 2021-03-31 DIAGNOSIS — S22060G Wedge compression fracture of T7-T8 vertebra, subsequent encounter for fracture with delayed healing: Secondary | ICD-10-CM | POA: Diagnosis not present

## 2021-03-31 DIAGNOSIS — S22000A Wedge compression fracture of unspecified thoracic vertebra, initial encounter for closed fracture: Secondary | ICD-10-CM | POA: Diagnosis present

## 2021-03-31 DIAGNOSIS — Z981 Arthrodesis status: Secondary | ICD-10-CM | POA: Diagnosis not present

## 2021-03-31 DIAGNOSIS — Z419 Encounter for procedure for purposes other than remedying health state, unspecified: Secondary | ICD-10-CM

## 2021-03-31 HISTORY — PX: KYPHOPLASTY: SHX5884

## 2021-03-31 LAB — CBC
HCT: 33.2 % — ABNORMAL LOW (ref 36.0–46.0)
Hemoglobin: 10.7 g/dL — ABNORMAL LOW (ref 12.0–15.0)
MCH: 30.3 pg (ref 26.0–34.0)
MCHC: 32.2 g/dL (ref 30.0–36.0)
MCV: 94.1 fL (ref 80.0–100.0)
Platelets: 205 10*3/uL (ref 150–400)
RBC: 3.53 MIL/uL — ABNORMAL LOW (ref 3.87–5.11)
RDW: 12.9 % (ref 11.5–15.5)
WBC: 6 10*3/uL (ref 4.0–10.5)
nRBC: 0 % (ref 0.0–0.2)

## 2021-03-31 LAB — BASIC METABOLIC PANEL
Anion gap: 7 (ref 5–15)
BUN: 35 mg/dL — ABNORMAL HIGH (ref 8–23)
CO2: 28 mmol/L (ref 22–32)
Calcium: 9.7 mg/dL (ref 8.9–10.3)
Chloride: 104 mmol/L (ref 98–111)
Creatinine, Ser: 1.68 mg/dL — ABNORMAL HIGH (ref 0.44–1.00)
GFR, Estimated: 32 mL/min — ABNORMAL LOW (ref >60)
Glucose, Bld: 97 mg/dL (ref 70–99)
Potassium: 4.9 mmol/L (ref 3.5–5.1)
Sodium: 139 mmol/L (ref 135–145)

## 2021-03-31 LAB — SURGICAL PCR SCREEN
MRSA, PCR: NEGATIVE
Staphylococcus aureus: NEGATIVE

## 2021-03-31 SURGERY — KYPHOPLASTY
Anesthesia: General

## 2021-03-31 MED ORDER — AMLODIPINE BESYLATE 5 MG PO TABS
2.5000 mg | ORAL_TABLET | Freq: Every day | ORAL | Status: DC
  Administered 2021-03-31: 2.5 mg via ORAL
  Filled 2021-03-31: qty 1

## 2021-03-31 MED ORDER — ESCITALOPRAM OXALATE 5 MG PO TABS
5.0000 mg | ORAL_TABLET | Freq: Every day | ORAL | Status: DC
  Filled 2021-03-31 (×3): qty 1

## 2021-03-31 MED ORDER — SENNA 8.6 MG PO TABS
1.0000 | ORAL_TABLET | Freq: Two times a day (BID) | ORAL | Status: DC
  Administered 2021-03-31 – 2021-04-01 (×2): 8.6 mg via ORAL
  Filled 2021-03-31 (×2): qty 1

## 2021-03-31 MED ORDER — MORPHINE SULFATE (PF) 2 MG/ML IV SOLN: 2.0000 mg | INTRAVENOUS | Status: DC | PRN

## 2021-03-31 MED ORDER — SIMVASTATIN 20 MG PO TABS
20.0000 mg | ORAL_TABLET | Freq: Every day | ORAL | Status: DC
  Administered 2021-03-31: 20 mg via ORAL
  Filled 2021-03-31: qty 1

## 2021-03-31 MED ORDER — IOPAMIDOL (ISOVUE-300) INJECTION 61%
INTRAVENOUS | Status: DC | PRN
  Administered 2021-03-31: 50 mL

## 2021-03-31 MED ORDER — CHLORHEXIDINE GLUCONATE 0.12 % MT SOLN
15.0000 mL | Freq: Once | OROMUCOSAL | Status: AC
  Administered 2021-03-31: 15 mL via OROMUCOSAL
  Filled 2021-03-31: qty 15

## 2021-03-31 MED ORDER — SODIUM CHLORIDE 0.9% FLUSH: 3.0000 mL | INTRAVENOUS | Status: DC | PRN

## 2021-03-31 MED ORDER — CEFAZOLIN SODIUM-DEXTROSE 2-4 GM/100ML-% IV SOLN
2.0000 g | Freq: Three times a day (TID) | INTRAVENOUS | Status: AC
  Administered 2021-03-31 – 2021-04-01 (×2): 2 g via INTRAVENOUS
  Filled 2021-03-31 (×2): qty 100

## 2021-03-31 MED ORDER — ORAL CARE MOUTH RINSE: 15.0000 mL | Freq: Once | OROMUCOSAL | Status: AC

## 2021-03-31 MED ORDER — POLYETHYL GLYCOL-PROPYL GLYCOL 0.4-0.3 % OP GEL: Freq: Every evening | OPHTHALMIC | Status: DC | PRN

## 2021-03-31 MED ORDER — PANTOPRAZOLE SODIUM 40 MG PO TBEC
40.0000 mg | DELAYED_RELEASE_TABLET | Freq: Every day | ORAL | Status: DC
  Administered 2021-04-01: 40 mg via ORAL
  Filled 2021-03-31: qty 1

## 2021-03-31 MED ORDER — ALUM & MAG HYDROXIDE-SIMETH 200-200-20 MG/5ML PO SUSP: 30.0000 mL | Freq: Four times a day (QID) | ORAL | Status: DC | PRN

## 2021-03-31 MED ORDER — ALPRAZOLAM 0.5 MG PO TABS
0.5000 mg | ORAL_TABLET | Freq: Two times a day (BID) | ORAL | Status: DC
  Administered 2021-03-31 – 2021-04-01 (×2): 0.5 mg via ORAL
  Filled 2021-03-31 (×2): qty 1

## 2021-03-31 MED ORDER — LEVOTHYROXINE SODIUM 25 MCG PO TABS
25.0000 ug | ORAL_TABLET | ORAL | Status: DC
  Administered 2021-04-01: 25 ug via ORAL
  Filled 2021-03-31: qty 1

## 2021-03-31 MED ORDER — METHOCARBAMOL 1000 MG/10ML IJ SOLN
500.0000 mg | Freq: Four times a day (QID) | INTRAVENOUS | Status: DC | PRN
  Filled 2021-03-31: qty 5

## 2021-03-31 MED ORDER — LIDOCAINE-EPINEPHRINE 1 %-1:100000 IJ SOLN
INTRAMUSCULAR | Status: AC
  Filled 2021-03-31: qty 1

## 2021-03-31 MED ORDER — POLYETHYLENE GLYCOL 3350 17 G PO PACK: 17.0000 g | PACK | Freq: Every day | ORAL | Status: DC | PRN

## 2021-03-31 MED ORDER — ONDANSETRON 4 MG PO TBDP: 4.0000 mg | ORAL_TABLET | Freq: Two times a day (BID) | ORAL | Status: DC | PRN

## 2021-03-31 MED ORDER — FENTANYL CITRATE (PF) 100 MCG/2ML IJ SOLN
INTRAMUSCULAR | Status: AC
  Filled 2021-03-31: qty 2

## 2021-03-31 MED ORDER — LIDOCAINE 2% (20 MG/ML) 5 ML SYRINGE
INTRAMUSCULAR | Status: DC | PRN
  Administered 2021-03-31: 100 mg via INTRAVENOUS

## 2021-03-31 MED ORDER — 0.9 % SODIUM CHLORIDE (POUR BTL) OPTIME
TOPICAL | Status: DC | PRN
  Administered 2021-03-31 (×2): 1000 mL

## 2021-03-31 MED ORDER — DEXAMETHASONE SODIUM PHOSPHATE 10 MG/ML IJ SOLN
INTRAMUSCULAR | Status: AC
  Filled 2021-03-31: qty 1

## 2021-03-31 MED ORDER — CEFAZOLIN SODIUM-DEXTROSE 2-4 GM/100ML-% IV SOLN
2.0000 g | INTRAVENOUS | Status: AC
  Administered 2021-03-31: 2 g via INTRAVENOUS
  Filled 2021-03-31: qty 100

## 2021-03-31 MED ORDER — DOCUSATE SODIUM 100 MG PO CAPS
100.0000 mg | ORAL_CAPSULE | Freq: Two times a day (BID) | ORAL | Status: DC
  Administered 2021-03-31 – 2021-04-01 (×2): 100 mg via ORAL
  Filled 2021-03-31 (×2): qty 1

## 2021-03-31 MED ORDER — BACLOFEN 10 MG PO TABS
10.0000 mg | ORAL_TABLET | Freq: Three times a day (TID) | ORAL | Status: DC
  Administered 2021-03-31 – 2021-04-01 (×3): 10 mg via ORAL
  Filled 2021-03-31 (×3): qty 1

## 2021-03-31 MED ORDER — PROPOFOL 10 MG/ML IV BOLUS
INTRAVENOUS | Status: DC | PRN
  Administered 2021-03-31: 170 mg via INTRAVENOUS

## 2021-03-31 MED ORDER — FAMOTIDINE 20 MG PO TABS
40.0000 mg | ORAL_TABLET | Freq: Every day | ORAL | Status: DC
  Administered 2021-03-31: 40 mg via ORAL
  Filled 2021-03-31: qty 2

## 2021-03-31 MED ORDER — DEXAMETHASONE SODIUM PHOSPHATE 10 MG/ML IJ SOLN
INTRAMUSCULAR | Status: DC | PRN
  Administered 2021-03-31: 5 mg via INTRAVENOUS

## 2021-03-31 MED ORDER — GLYCOPYRROLATE PF 0.2 MG/ML IJ SOSY
PREFILLED_SYRINGE | INTRAMUSCULAR | Status: DC | PRN
  Administered 2021-03-31: .2 mg via INTRAVENOUS

## 2021-03-31 MED ORDER — ROCURONIUM BROMIDE 10 MG/ML (PF) SYRINGE
PREFILLED_SYRINGE | INTRAVENOUS | Status: DC | PRN
  Administered 2021-03-31: 50 mg via INTRAVENOUS

## 2021-03-31 MED ORDER — METHOCARBAMOL 500 MG PO TABS
ORAL_TABLET | ORAL | Status: AC
  Filled 2021-03-31: qty 1

## 2021-03-31 MED ORDER — LOSARTAN POTASSIUM 50 MG PO TABS
50.0000 mg | ORAL_TABLET | Freq: Two times a day (BID) | ORAL | Status: DC
  Administered 2021-03-31 – 2021-04-01 (×2): 50 mg via ORAL
  Filled 2021-03-31 (×2): qty 1

## 2021-03-31 MED ORDER — FUROSEMIDE 20 MG PO TABS
20.0000 mg | ORAL_TABLET | ORAL | Status: DC
  Administered 2021-04-01: 20 mg via ORAL
  Filled 2021-03-31: qty 1

## 2021-03-31 MED ORDER — PHENOL 1.4 % MT LIQD: 1.0000 | OROMUCOSAL | Status: DC | PRN

## 2021-03-31 MED ORDER — BISACODYL 10 MG RE SUPP: 10.0000 mg | Freq: Every day | RECTAL | Status: DC | PRN

## 2021-03-31 MED ORDER — MENTHOL 3 MG MT LOZG: 1.0000 | LOZENGE | OROMUCOSAL | Status: DC | PRN

## 2021-03-31 MED ORDER — SODIUM CHLORIDE 0.9% FLUSH
3.0000 mL | Freq: Two times a day (BID) | INTRAVENOUS | Status: DC
  Administered 2021-03-31: 3 mL via INTRAVENOUS

## 2021-03-31 MED ORDER — FENTANYL CITRATE (PF) 100 MCG/2ML IJ SOLN
25.0000 ug | INTRAMUSCULAR | Status: DC | PRN
  Administered 2021-03-31 (×2): 50 ug via INTRAVENOUS

## 2021-03-31 MED ORDER — ONDANSETRON HCL 4 MG/2ML IJ SOLN: 4.0000 mg | Freq: Four times a day (QID) | INTRAMUSCULAR | Status: DC | PRN

## 2021-03-31 MED ORDER — EPHEDRINE SULFATE 50 MG/ML IJ SOLN
INTRAMUSCULAR | Status: DC | PRN
  Administered 2021-03-31: 10 mg via INTRAVENOUS
  Administered 2021-03-31: 5 mg via INTRAVENOUS
  Administered 2021-03-31: 10 mg via INTRAVENOUS

## 2021-03-31 MED ORDER — FENTANYL CITRATE (PF) 250 MCG/5ML IJ SOLN
INTRAMUSCULAR | Status: DC | PRN
  Administered 2021-03-31: 150 ug via INTRAVENOUS

## 2021-03-31 MED ORDER — LIDOCAINE-EPINEPHRINE 1 %-1:100000 IJ SOLN
INTRAMUSCULAR | Status: DC | PRN
  Administered 2021-03-31: 5 mL

## 2021-03-31 MED ORDER — EPHEDRINE 5 MG/ML INJ
INTRAVENOUS | Status: AC
  Filled 2021-03-31: qty 10

## 2021-03-31 MED ORDER — BUPIVACAINE HCL (PF) 0.5 % IJ SOLN
INTRAMUSCULAR | Status: DC | PRN
  Administered 2021-03-31: 5 mL

## 2021-03-31 MED ORDER — ACETAMINOPHEN 650 MG RE SUPP: 650.0000 mg | RECTAL | Status: DC | PRN

## 2021-03-31 MED ORDER — CHLORHEXIDINE GLUCONATE CLOTH 2 % EX PADS: 6.0000 | MEDICATED_PAD | Freq: Once | CUTANEOUS | Status: DC

## 2021-03-31 MED ORDER — FENTANYL CITRATE (PF) 250 MCG/5ML IJ SOLN
INTRAMUSCULAR | Status: AC
  Filled 2021-03-31: qty 5

## 2021-03-31 MED ORDER — ACETAMINOPHEN 325 MG PO TABS: 650.0000 mg | ORAL_TABLET | ORAL | Status: DC | PRN

## 2021-03-31 MED ORDER — ATENOLOL 25 MG PO TABS
25.0000 mg | ORAL_TABLET | Freq: Every day | ORAL | Status: DC
  Administered 2021-04-01: 25 mg via ORAL
  Filled 2021-03-31: qty 1

## 2021-03-31 MED ORDER — ONDANSETRON HCL 4 MG/2ML IJ SOLN
INTRAMUSCULAR | Status: DC | PRN
  Administered 2021-03-31: 4 mg via INTRAVENOUS

## 2021-03-31 MED ORDER — ROCURONIUM BROMIDE 10 MG/ML (PF) SYRINGE
PREFILLED_SYRINGE | INTRAVENOUS | Status: AC
  Filled 2021-03-31: qty 10

## 2021-03-31 MED ORDER — LACTATED RINGERS IV SOLN: INTRAVENOUS | Status: DC

## 2021-03-31 MED ORDER — HYDROMORPHONE HCL 1 MG/ML IJ SOLN
0.5000 mg | INTRAMUSCULAR | Status: DC | PRN
  Administered 2021-03-31: 0.5 mg via INTRAVENOUS
  Filled 2021-03-31: qty 0.5

## 2021-03-31 MED ORDER — SUGAMMADEX SODIUM 200 MG/2ML IV SOLN
INTRAVENOUS | Status: DC | PRN
  Administered 2021-03-31: 400 mg via INTRAVENOUS

## 2021-03-31 MED ORDER — HYDROCODONE-ACETAMINOPHEN 5-325 MG PO TABS
ORAL_TABLET | ORAL | Status: AC
  Filled 2021-03-31: qty 1

## 2021-03-31 MED ORDER — ONDANSETRON HCL 4 MG PO TABS: 4.0000 mg | ORAL_TABLET | Freq: Four times a day (QID) | ORAL | Status: DC | PRN

## 2021-03-31 MED ORDER — LIDOCAINE HCL (PF) 2 % IJ SOLN
INTRAMUSCULAR | Status: AC
  Filled 2021-03-31: qty 5

## 2021-03-31 MED ORDER — HYDROCODONE-ACETAMINOPHEN 5-325 MG PO TABS
1.0000 | ORAL_TABLET | Freq: Three times a day (TID) | ORAL | Status: DC
  Administered 2021-03-31 – 2021-04-01 (×4): 1 via ORAL
  Filled 2021-03-31 (×3): qty 1

## 2021-03-31 MED ORDER — FLEET ENEMA 7-19 GM/118ML RE ENEM: 1.0000 | ENEMA | Freq: Once | RECTAL | Status: DC | PRN

## 2021-03-31 MED ORDER — GABAPENTIN 600 MG PO TABS
600.0000 mg | ORAL_TABLET | Freq: Three times a day (TID) | ORAL | Status: DC
  Administered 2021-03-31 – 2021-04-01 (×3): 600 mg via ORAL
  Filled 2021-03-31 (×3): qty 1

## 2021-03-31 MED ORDER — SODIUM CHLORIDE 0.9 % IV SOLN: 250.0000 mL | INTRAVENOUS | Status: DC

## 2021-03-31 MED ORDER — BUPIVACAINE HCL (PF) 0.5 % IJ SOLN
INTRAMUSCULAR | Status: AC
  Filled 2021-03-31: qty 30

## 2021-03-31 MED ORDER — METHOCARBAMOL 500 MG PO TABS
500.0000 mg | ORAL_TABLET | Freq: Four times a day (QID) | ORAL | Status: DC | PRN
  Administered 2021-03-31 – 2021-04-01 (×2): 500 mg via ORAL
  Filled 2021-03-31: qty 1

## 2021-03-31 MED ORDER — ONDANSETRON HCL 4 MG/2ML IJ SOLN
INTRAMUSCULAR | Status: AC
  Filled 2021-03-31: qty 2

## 2021-03-31 SURGICAL SUPPLY — 40 items
BLADE CLIPPER SURG (BLADE) IMPLANT
BLADE SURG 11 STRL SS (BLADE) ×3 IMPLANT
BNDG ADH 1X3 SHEER STRL LF (GAUZE/BANDAGES/DRESSINGS) IMPLANT
CEMENT KYPHON C01A KIT/MIXER (Cement) ×3 IMPLANT
CONT SPEC 4OZ CLIKSEAL STRL BL (MISCELLANEOUS) ×3 IMPLANT
COVER WAND RF STERILE (DRAPES) ×3 IMPLANT
DECANTER SPIKE VIAL GLASS SM (MISCELLANEOUS) ×3 IMPLANT
DERMABOND ADVANCED (GAUZE/BANDAGES/DRESSINGS) ×2
DERMABOND ADVANCED .7 DNX12 (GAUZE/BANDAGES/DRESSINGS) ×1 IMPLANT
DRAPE C-ARM 42X72 X-RAY (DRAPES) ×3 IMPLANT
DRAPE HALF SHEET 40X57 (DRAPES) ×3 IMPLANT
DRAPE INCISE IOBAN 66X45 STRL (DRAPES) ×3 IMPLANT
DRAPE LAPAROTOMY 100X72X124 (DRAPES) ×3 IMPLANT
DRAPE WARM FLUID 44X44 (DRAPES) ×3 IMPLANT
DURAPREP 26ML APPLICATOR (WOUND CARE) ×3 IMPLANT
GAUZE 4X4 16PLY RFD (DISPOSABLE) ×3 IMPLANT
GLOVE BIOGEL PI IND STRL 8.5 (GLOVE) ×1 IMPLANT
GLOVE BIOGEL PI INDICATOR 8.5 (GLOVE) ×2
GLOVE ECLIPSE 8.5 STRL (GLOVE) ×3 IMPLANT
GLOVE EXAM NITRILE XL STR (GLOVE) IMPLANT
GLOVE SURG UNDER POLY LF SZ7 (GLOVE) ×6 IMPLANT
GLOVE SURG UNDER POLY LF SZ7.5 (GLOVE) ×6 IMPLANT
GOWN STRL REUS W/ TWL LRG LVL3 (GOWN DISPOSABLE) ×1 IMPLANT
GOWN STRL REUS W/ TWL XL LVL3 (GOWN DISPOSABLE) IMPLANT
GOWN STRL REUS W/TWL 2XL LVL3 (GOWN DISPOSABLE) ×3 IMPLANT
GOWN STRL REUS W/TWL LRG LVL3 (GOWN DISPOSABLE) ×3
GOWN STRL REUS W/TWL XL LVL3 (GOWN DISPOSABLE)
KIT BASIN OR (CUSTOM PROCEDURE TRAY) ×3 IMPLANT
KIT TURNOVER KIT B (KITS) ×3 IMPLANT
NEEDLE HYPO 25X1 1.5 SAFETY (NEEDLE) ×3 IMPLANT
NS IRRIG 1000ML POUR BTL (IV SOLUTION) ×6 IMPLANT
PACK SURGICAL SETUP 50X90 (CUSTOM PROCEDURE TRAY) ×3 IMPLANT
PAD ARMBOARD 7.5X6 YLW CONV (MISCELLANEOUS) ×9 IMPLANT
SPECIMEN JAR SMALL (MISCELLANEOUS) IMPLANT
SUT VIC AB 4-0 RB1 18 (SUTURE) ×3 IMPLANT
SUT VICRYL RAPIDE 4/0 PS 2 (SUTURE) ×3 IMPLANT
SYR CONTROL 10ML LL (SYRINGE) ×6 IMPLANT
TOWEL GREEN STERILE (TOWEL DISPOSABLE) ×3 IMPLANT
TOWEL GREEN STERILE FF (TOWEL DISPOSABLE) ×3 IMPLANT
TRAY KYPHOPAK 15/2 EXPRESS (KITS) ×3 IMPLANT

## 2021-03-31 NOTE — Anesthesia Postprocedure Evaluation (Signed)
Anesthesia Post Note  Patient: [name]  Procedure(s) Performed: Thoracic seven Kyphoplasty     Patient location during evaluation: PACU Anesthesia Type: General Level of consciousness: awake Pain management: pain level controlled Respiratory status: spontaneous breathing Cardiovascular status: stable Postop Assessment: no apparent nausea or vomiting Anesthetic complications: no   No notable events documented.  Last Vitals:  Vitals:   03/31/21 1424 03/31/21 1430  BP: 138/77   Pulse:    Resp: 19   Temp:    SpO2:  100%    Last Pain:  Vitals:   03/31/21 1424  TempSrc:   PainSc: Asleep                 Czarina Gingras

## 2021-03-31 NOTE — H&P (Signed)
[name] is an 75 y.o. female.   Chief Complaint: Back pain secondary to T7 fracture HPI: [name] is a 75 year old individual whose had advanced osteoporosis.  She recently had a T11 kyphoplasty but has a residual T7 fracture and this is showed delayed healing with chronic back pain.  A T7 kyphoplasty is now being performed.  Past Medical History:  Diagnosis Date   Anemia    Arthritis    osteoarthritis   Claustrophobia    Complication of anesthesia    hard time waking up after her gallbladder surgery 28 years ago   DVT (deep venous thrombosis) (HCC)    left leg   GERD (gastroesophageal reflux disease)    hx of   Heart murmur    Dr. Katrinka Blazing follows, 'nothing to worry about.'   History of blood transfusion    History of hiatal hernia    small   History of kidney stones    Hypercholesteremia    under control   Hypertension    Hypothyroidism    Mild aortic stenosis    a. by echo 12/2017.   Panic attack    PONV (postoperative nausea and vomiting)    Pulmonary embolism (HCC)    bilateral lungs - 2008 after disc surgery    Past Surgical History:  Procedure Laterality Date   ABDOMINAL HYSTERECTOMY  1975   partial   APPENDECTOMY  1973   BACK SURGERY     X14 1987-2009   CARDIAC CATHETERIZATION  ~2010   CARPAL TUNNEL RELEASE Bilateral    CHOLECYSTECTOMY  1997   COLONOSCOPY     COLONOSCOPY WITH PROPOFOL N/A 12/05/2017   Procedure: COLONOSCOPY WITH PROPOFOL;  Surgeon: Carman Ching, MD;  Location: [facility];  Service: Endoscopy;  Laterality: N/A;   DILATION AND CURETTAGE OF UTERUS     ENDARTERECTOMY Right 08/10/2013   Procedure: Excision of Venous Aneurysm Right Neck;  Surgeon: Larina Earthly, MD;  Location: [facility] OR;  Service: Vascular;  Laterality: Right;   ESOPHAGOGASTRODUODENOSCOPY (EGD) WITH PROPOFOL N/A 12/05/2017   Procedure: ESOPHAGOGASTRODUODENOSCOPY (EGD) WITH PROPOFOL;  Surgeon: Carman Ching, MD;  Location: [facility];  Service: Endoscopy;  Laterality: N/A;    filter Left 2009   IVC   HIP SURGERY Left 2008   JOINT REPLACEMENT Right 11/2003   (knee)multiple surgeries   JOINT REPLACEMENT Left 12/2009   (knee)   JOINT REPLACEMENT Left 2011, 2012   Hip   KYPHOPLASTY N/A 03/03/2021   Procedure: Thoracic seven Kyphoplasty;  Surgeon: Barnett Abu, MD;  Location: [facility] OR;  Service: Neurosurgery;  Laterality: N/A;   MASS EXCISION  1974   uterus   QUADRICEPS TENDON REPAIR Right 12/06/2016   Procedure: QUAD TENDON RECONSTRUCTION;  Surgeon: Durene Romans, MD;  Location: [facility];  Service: Orthopedics;  Laterality: Right;  Requests for 90 mins   RADIOLOGY WITH ANESTHESIA N/A 01/20/2021   Procedure: MRI WITH [facility] PAIN WITHOUT CONTRAST;  Surgeon: Radiologist, Medication, MD;  Location: [facility] OR;  Service: Radiology;  Laterality: N/A;   TONSILLECTOMY  1986   TOTAL HIP REVISION Left 12/07/2013   Procedure:  REVISION  CONSTRAINED LINER LEFT TOTAL HIP ;  Surgeon: Shelda Pal, MD;  Location: [facility];  Service: Orthopedics;  Laterality: Left;   TOTAL SHOULDER REPLACEMENT Right 11/30/2010   TUBAL LIGATION  1973    Family History  Problem Relation Age of Onset   Diabetes Mother    Heart disease Mother        before age 59 - ?  thick blood - pt does not know if patient had formal heart issue but heart stopped   Hyperlipidemia Mother    Hypertension Mother    Diabetes Father    Hyperlipidemia Father    Hypertension Father    Heart attack Father        in his 21s during adm for flu   Peripheral vascular disease Father    Other Father        amputation   Diabetes Sister    Hyperlipidemia Sister    Hypertension Sister    Deep vein thrombosis Brother    Hyperlipidemia Brother    Hypertension Brother    Social History:  reports that she has never smoked. She has never used smokeless tobacco. She reports that she does not drink alcohol and does not use drugs.  Allergies:  Allergies  Allergen Reactions   Celecoxib Rash    Medications Prior to Admission   Medication Sig Dispense Refill   ALPRAZolam (XANAX) 0.5 MG tablet Take 0.5 mg by mouth 2 (two) times daily.     amLODipine (NORVASC) 5 MG tablet Take 2.5 mg by mouth at bedtime.     atenolol (TENORMIN) 25 MG tablet Take 25 mg by mouth See admin instructions. Take one tablet (25 mg) by mouth every morning, may also take a 2nd tablet (25 mg) if SBP >150     baclofen (LIORESAL) 10 MG tablet Take 1 tablet (10 mg total) by mouth every 8 (eight) hours. 30 each 0   Calcium Carb-Cholecalciferol (CALCIUM-VITAMIN D) 600-400 MG-UNIT TABS Take 2 tablets by mouth daily. Gummies     cefdinir (OMNICEF) 300 MG capsule Take 300 mg by mouth 2 (two) times daily.     escitalopram (LEXAPRO) 5 MG tablet Take 5 mg by mouth at bedtime.     famotidine (PEPCID) 40 MG tablet Take 40 mg by mouth at bedtime.  3   furosemide (LASIX) 20 MG tablet Take 20 mg by mouth See admin instructions. Take one tablet (20 mg) by mouth Monday through Friday     gabapentin (NEURONTIN) 600 MG tablet Take 600 mg by mouth 3 (three) times daily.     HYDROcodone-acetaminophen (NORCO/VICODIN) 5-325 MG tablet Take 1 tablet by mouth 3 (three) times daily.     levothyroxine (SYNTHROID, LEVOTHROID) 50 MCG tablet Take 25 mcg by mouth every Monday, Wednesday, and Friday.  1   losartan (COZAAR) 50 MG tablet Take 50 mg by mouth 2 (two) times daily.     OVER THE COUNTER MEDICATION Take 2 tablets by mouth daily. Multivitamin plus Omega 3 supplement     OVER THE COUNTER MEDICATION Apply 1 application topically 3 (three) times daily as needed (joint pain). [place] Dream Cream- Muscle Spasms     pantoprazole (PROTONIX) 40 MG tablet Take 40 mg by mouth daily.     Polyethyl Glycol-Propyl Glycol (SYSTANE OP) Place 1-2 drops into both eyes at bedtime as needed (for dry eyes).     Probiotic Product (PROBIOTIC PO) Take 1 capsule by mouth daily.     Rivaroxaban (XARELTO) 20 MG TABS tablet Take 20 mg by mouth daily at 3 pm.      simvastatin (ZOCOR) 20 MG tablet  Take 20 mg by mouth at bedtime.     vitamin C (ASCORBIC ACID) 250 MG tablet Take 1,000 mg by mouth daily.     ondansetron (ZOFRAN-ODT) 4 MG disintegrating tablet Take 4 mg by mouth 2 (two) times daily as needed for nausea or vomiting.  3    Results for orders placed or performed during the hospital encounter of 03/31/21 (from the past 48 hour(s))  Basic metabolic panel per protocol     Status: Abnormal   Collection Time: 03/31/21  9:21 AM  Result Value Ref Range   Sodium 139 135 - 145 mmol/L   Potassium 4.9 3.5 - 5.1 mmol/L   Chloride 104 98 - 111 mmol/L   CO2 28 22 - 32 mmol/L   Glucose, Bld 97 70 - 99 mg/dL    Comment: Glucose reference range applies only to samples taken after fasting for at least 8 hours.   BUN 35 (H) 8 - 23 mg/dL   Creatinine, Ser 1.611.68 (H) 0.44 - 1.00 mg/dL   Calcium 9.7 8.9 - 09.610.3 mg/dL   GFR, Estimated 32 (L) >60 mL/min    Comment: (NOTE) Calculated using the CKD-EPI Creatinine Equation (2021)    Anion gap 7 5 - 15    Comment: Performed at [facility], [address] [place], [place] [address]  CBC per protocol     Status: Abnormal   Collection Time: 03/31/21  9:21 AM  Result Value Ref Range   WBC 6.0 4.0 - 10.5 K/uL   RBC 3.53 (L) 3.87 - 5.11 MIL/uL   Hemoglobin 10.7 (L) 12.0 - 15.0 g/dL   HCT 98.133.2 (L) 19.136.0 - 47.846.0 %   MCV 94.1 80.0 - 100.0 fL   MCH 30.3 26.0 - 34.0 pg   MCHC 32.2 30.0 - 36.0 g/dL   RDW 29.512.9 62.111.5 - 30.815.5 %   Platelets 205 150 - 400 K/uL   nRBC 0.0 0.0 - 0.2 %    Comment: Performed at [facility], [address] [place], [place] [address]   No results found.  Review of Systems  Constitutional:  Positive for activity change.  HENT: Negative.    Eyes: Negative.   Respiratory: Negative.    Cardiovascular: Negative.   Gastrointestinal: Negative.   Endocrine: Negative.   Genitourinary: Negative.   Musculoskeletal:  Positive for back pain.  Allergic/Immunologic: Negative.   Neurological:  Positive for weakness.   Hematological: Negative.   Psychiatric/Behavioral: Negative.     Blood pressure (!) 166/60, pulse (!) 52, temperature 97.9 F (36.6 C), temperature source Oral, resp. rate 18, height 5' 2.5" (1.588 m), weight 119.3 kg, SpO2 98 %. Physical Exam Constitutional:      Appearance: She is obese.  HENT:     Head: Normocephalic and atraumatic.     Right Ear: Tympanic membrane normal.     Left Ear: Tympanic membrane normal.     Nose: Nose normal.     Mouth/Throat:     Mouth: Mucous membranes are moist.  Eyes:     Extraocular Movements: Extraocular movements intact.     Conjunctiva/sclera: Conjunctivae normal.     Pupils: Pupils are equal, round, and reactive to light.  Cardiovascular:     Rate and Rhythm: Normal rate and regular rhythm.     Pulses: Normal pulses.     Heart sounds: Normal heart sounds.  Pulmonary:     Effort: Pulmonary effort is normal.     Breath sounds: Normal breath sounds.  Abdominal:     General: Abdomen is flat.     Palpations: Abdomen is soft.  Musculoskeletal:        General: Tenderness present.     Cervical back: Normal range of motion.  Neurological:     Mental Status: She is alert.  Comments: Diffuse strength weakness in the lower extremities at 4 out of 5 bilaterally.  Absent reflexes in the patella and the Achilles.  Upper extremity strength is intact absent reflexes thereto cranial nerve examination is normal.     Assessment/Plan T7 osteoporotic compression fracture.  Plan T7 acrylic balloon kyphoplasty  Stefani Dama, MD 03/31/2021, 12:25 PM

## 2021-03-31 NOTE — Progress Notes (Signed)
Patient ID: [name], female   DOB: 30-Jun-1946, 75 y.o.   MRN: [id number] Patient having significant pain on right side radiating to breast ventrally. Will obtain CT post op. Add dilaudid to pain regimen.

## 2021-03-31 NOTE — Evaluation (Addendum)
Physical Therapy Evaluation Patient Details Name: [name] MRN: [id number] DOB: 08/07/1946 Today's Date: 03/31/2021   History of Present Illness  Pt is a 75 y.o. F who presents s/p T7 kyphoplasty in light of residual compression fracture. Significant PMH: T11 kyphoplasty, DVT, PE.  Clinical Impression  Patient is s/p above surgery resulting in the deficits listed below (see PT Problem List). Prior to admission, pt is modI with mobility using a cane. Does report x 2 falls since January. Pt presents with generalized weakness, decreased functional use of RUE, balance deficits, and decreased endurance. Ambulating x 200 feet with a walker at a min guard assist level. Pt reporting sharp pain inferior to right breast and radiating laterally. RN aware and cleared for mobility. Patient will benefit from skilled PT to increase their independence and safety with mobility (while adhering to their precautions) to allow discharge to the venue listed below.     Follow Up Recommendations Home health PT;Supervision for mobility/OOB    Equipment Recommendations  None recommended by PT    Recommendations for Other Services       Precautions / Restrictions Precautions Precautions: Fall;Back Precaution Booklet Issued: Yes (comment) Precaution Comments: Back precautions for comfort Restrictions Weight Bearing Restrictions: No      Mobility  Bed Mobility Overal bed mobility: Needs Assistance Bed Mobility: Supine to Sit;Sit to Supine     Supine to sit: Supervision Sit to supine: Min assist   General bed mobility comments: No physical assist to progress to edge of bed, minA for LE negotiation back in    Transfers Overall transfer level: Needs assistance Equipment used: Rolling walker (2 wheeled) Transfers: Sit to/from Stand Sit to Stand: Min guard         General transfer comment: Min guard to rise from edge of bed  Ambulation/Gait Ambulation/Gait assistance: Min guard Gait  Distance (Feet): 200 Feet Assistive device: Rolling walker (2 wheeled) Gait Pattern/deviations: Step-through pattern;Antalgic;Decreased stride length;Trunk flexed Gait velocity: decreased Gait velocity interpretation: <1.8 ft/sec, indicate of risk for recurrent falls General Gait Details: Cues for upright posture, activity pacing, min guard for safety. pt with increased R foot ER which she states is baseline  Information systems manager Rankin (Stroke Patients Only)       Balance Overall balance assessment: Needs assistance Sitting-balance support: Feet supported Sitting balance-Leahy Scale: Fair     Standing balance support: Bilateral upper extremity supported Standing balance-Leahy Scale: Poor Standing balance comment: reliant on external support                             Pertinent Vitals/Pain Pain Assessment: Faces Faces Pain Scale: Hurts even more Pain Location: Inferior to right breast and radiating laterally Pain Descriptors / Indicators: Grimacing;Sharp Pain Intervention(s): Limited activity within patient's tolerance;Monitored during session;Other (comment);Premedicated before session (RN, MD aware)    Home Living Family/patient expects to be discharged to:: Private residence Living Arrangements: Spouse/significant other Available Help at Discharge: Family Type of Home: House Home Access: Stairs to enter Entrance Stairs-Rails: Left Entrance Stairs-Number of Steps: 2 Home Layout: One level Home Equipment: Walker - 2 wheels;Cane - single point;Grab bars - tub/shower;Shower seat      Prior Function Level of Independence: Needs assistance   Gait / Transfers Assistance Needed: uses cane  ADL's / Homemaking Assistance Needed: able to assist husband with IADL's i.e. cooking, adapts and sits  and folds laundry        Hand Dominance        Extremity/Trunk Assessment   Upper Extremity Assessment Upper Extremity  Assessment: Defer to OT evaluation    Lower Extremity Assessment Lower Extremity Assessment: Generalized weakness    Cervical / Trunk Assessment Cervical / Trunk Assessment: Other exceptions;Kyphotic Cervical / Trunk Exceptions: increased body habitus  Communication   Communication: No difficulties  Cognition Arousal/Alertness: Awake/alert Behavior During Therapy: WFL for tasks assessed/performed Overall Cognitive Status: Within Functional Limits for tasks assessed                                        General Comments      Exercises     Assessment/Plan    PT Assessment Patient needs continued PT services  PT Problem List Decreased strength;Decreased activity tolerance;Decreased balance;Decreased mobility;Pain       PT Treatment Interventions DME instruction;Gait training;Stair training;Functional mobility training;Therapeutic activities;Balance training;Therapeutic exercise;Patient/family education    PT Goals (Current goals can be found in the Care Plan section)  Acute Rehab PT Goals Patient Stated Goal: remain indepedent, use cane vs walker PT Goal Formulation: With patient Time For Goal Achievement: 04/14/21 Potential to Achieve Goals: Good    Frequency Min 5X/week   Barriers to discharge        Co-evaluation               AM-PAC PT "6 Clicks" Mobility  Outcome Measure Help needed turning from your back to your side while in a flat bed without using bedrails?: None Help needed moving from lying on your back to sitting on the side of a flat bed without using bedrails?: A Little Help needed moving to and from a bed to a chair (including a wheelchair)?: A Little Help needed standing up from a chair using your arms (e.g., wheelchair or bedside chair)?: A Little Help needed to walk in hospital room?: A Little Help needed climbing 3-5 steps with a railing? : A Little 6 Click Score: 19    End of Session Equipment Utilized During  Treatment: Gait belt Activity Tolerance: Patient tolerated treatment well Patient left: in bed;with call bell/phone within reach Nurse Communication: Mobility status PT Visit Diagnosis: Unsteadiness on feet (R26.81);Pain;Difficulty in walking, not elsewhere classified (R26.2) Pain - Right/Left: Right Pain - part of body:  (flank)    Time: 2876-8115 PT Time Calculation (min) (ACUTE ONLY): 24 min   Charges:   PT Evaluation $PT Eval Low Complexity: 1 Low PT Treatments $Gait Training: 8-22 mins        Lillia Pauls, PT, DPT Acute Rehabilitation Services Pager [phone] Office [phone]   Norval Morton 03/31/2021, 5:22 PM

## 2021-03-31 NOTE — Transfer of Care (Signed)
Immediate Anesthesia Transfer of Care Note  Patient: [name]  Procedure(s) Performed: Thoracic seven Kyphoplasty  Patient Location: PACU  Anesthesia Type:General  Level of Consciousness: awake and alert   Airway & Oxygen Therapy: Patient Spontanous Breathing and Patient connected to face mask oxygen  Post-op Assessment: Report given to RN and Post -op Vital signs reviewed and stable  Post vital signs: Reviewed and stable  Last Vitals:  Vitals Value Taken Time  BP 151/69 03/31/21 1353  Temp    Pulse 70 03/31/21 1354  Resp 15 03/31/21 1354  SpO2 98 % 03/31/21 1354  Vitals shown include unvalidated device data.  Last Pain:  Vitals:   03/31/21 1015  TempSrc:   PainSc: 7       Patients Stated Pain Goal: 3 (03/31/21 1015)  Complications: No notable events documented.

## 2021-03-31 NOTE — Op Note (Signed)
Date of surgery: 03/31/2021 Preoperative diagnosis: T7 subacute compression fracture with delayed healing. Postoperative diagnosis: Same Procedure: T7 acrylic balloon kyphoplasty Surgeon: Barnett Abu Anesthesia: General endotracheal Indications: [name] Plate is a 75 year old individuals had significant chronic back pain she has T7 subacute chronic fracture with continued thoracic back pain.  Procedure: Patient was brought to the operating room supine on the stretcher.  After the smooth induction of general endotracheal anesthesia she was carefully turned prone.  Fluoroscopy was brought into view the T7 fracture in the AP and lateral projections and the skin was marked appropriately.  Was then prepped with alcohol DuraPrep and draped in a sterile fashion.  A left-sided approach was chosen initially and after infiltrating the skin with 2 cc of lidocaine with epinephrine a stab incision was created in this region and a Jamshidi needle was then placed into the lateral aspect of the vertebral body on the left side.  The central portion of the vertebral body was then drilled and a balloon was inserted despite inflating the balloon to 250 mmHg pressure no significant cavity was formed.  Through this aperture 2 cc of cement was then injected into the vertebral body filling mostly the left half of the vertebral body the right half was not filled.  Because of this a second stab incision was created on the right side and a Jamshidi needle was inserted into the lateral aspect of the vertebral body on that right side.  No balloon was inflated here as the bone was also fairly hard but 1 cc of acrylic cement was injected into the vertebral body filling the right half.  Good filling was achieved throughout the vertebral body and the height was maintained nicely.  No extravasation of cement was noted.  With a final verification radiographs being obtained the Jamshidi needles were removed after reinserting the trocar.  The  incisions were then closed with a 4-0 Vicryl in interrupted fashion and Dermabond was placed on the skin blood loss was nil patient tolerated procedure well.

## 2021-03-31 NOTE — Anesthesia Procedure Notes (Signed)
Procedure Name: Intubation Date/Time: 03/31/2021 12:44 PM Performed by: Clearnce Sorrel, CRNA Pre-anesthesia Checklist: Patient identified, Emergency Drugs available, Suction available and Patient being monitored Patient Re-evaluated:Patient Re-evaluated prior to induction Oxygen Delivery Method: Circle System Utilized Preoxygenation: Pre-oxygenation with 100% oxygen Induction Type: IV induction Ventilation: Mask ventilation without difficulty Laryngoscope Size: Mac and 3 Grade View: Grade I Tube type: Oral Tube size: 7.0 mm Number of attempts: 1 Airway Equipment and Method: Stylet and Oral airway Placement Confirmation: ETT inserted through vocal cords under direct vision, positive ETCO2 and breath sounds checked- equal and bilateral Secured at: 22 cm Tube secured with: Tape Dental Injury: Teeth and Oropharynx as per pre-operative assessment  Comments: Inserted by Paulina Fusi, SRNA

## 2021-04-01 ENCOUNTER — Encounter (HOSPITAL_COMMUNITY): Payer: Self-pay | Admitting: Neurological Surgery

## 2021-04-01 DIAGNOSIS — M8008XA Age-related osteoporosis with current pathological fracture, vertebra(e), initial encounter for fracture: Secondary | ICD-10-CM | POA: Diagnosis not present

## 2021-04-01 MED ORDER — HYDROCODONE-ACETAMINOPHEN 5-325 MG PO TABS: 1.0000 | ORAL_TABLET | ORAL | 0 refills | Status: DC | PRN

## 2021-04-01 NOTE — Progress Notes (Signed)
Occupational Therapy Evaluation Patient Details Name: [name] MRN: [id number] DOB: December 28, 1945 Today's Date: 04/01/2021    History of Present Illness Pt is a 75 y.o. F who presents s/p T7 kyphoplasty in light of residual compression fracture. Significant PMH: T11 kyphoplasty, DVT, PE.   Clinical Impression   [name] was evaluated s/p the above cervical sx. PTA pt was mod I for ADLs used a cane for ambulation, drives, grocery shops and shares household responsibilities with her husband; they live in a 1 level home with 2 STE. Upon evaluation pt was close min guard for room distance ambulation and transfers with RW, min/mod A for ADLs for lower body assistance. Reviewed all back precautions for safety and comfort, and compensatory techniques for ADLs. Pt reported she has had 2 falls since January. Pt does not required further OT services acutely. Recommend d/c home with supervision/assistance with all mobility and ADLs initially.     Follow Up Recommendations  No OT follow up;Supervision - Intermittent    Equipment Recommendations  None recommended by OT       Precautions / Restrictions Precautions Precautions: Fall;Back Precaution Booklet Issued: Yes (comment) Precaution Comments: Back precautions for comfort Restrictions Weight Bearing Restrictions: No      Mobility Bed Mobility Overal bed mobility: Needs Assistance     General bed mobility comments: pt sitting EOB upon arrival    Transfers Overall transfer level: Needs assistance Equipment used: Rolling walker (2 wheeled) Transfers: Sit to/from Stand Sit to Stand: Min guard         General transfer comment: Min guard to rise from edge of bed    Balance Overall balance assessment: Needs assistance Sitting-balance support: Feet supported Sitting balance-Leahy Scale: Good     Standing balance support: Bilateral upper extremity supported Standing balance-Leahy Scale: Poor Standing balance comment: reliant on  external support         ADL either performed or assessed with clinical judgement   ADL Overall ADL's : Needs assistance/impaired Eating/Feeding: Independent;Sitting   Grooming: Oral care;Wash/dry face;Cueing for compensatory techniques;Min guard;Standing   Upper Body Bathing: Set up;Sitting   Lower Body Bathing: Minimal assistance;Sit to/from stand;Cueing for compensatory techniques;Cueing for safety   Upper Body Dressing : Set up;Sitting   Lower Body Dressing: Moderate assistance;Sit to/from stand   Toilet Transfer: Min guard;RW;Comfort height toilet;Grab bars   Toileting- Architect and Hygiene: Supervision/safety;Cueing for compensatory techniques       Functional mobility during ADLs: Min guard;Rolling walker General ADL Comments: compensatory techniques for back precautions for comfort and safety - min guard for safety during ambulation      Pertinent Vitals/Pain Pain Assessment: Faces Faces Pain Scale: Hurts a little bit Pain Location: soreness throughout lower back Pain Descriptors / Indicators: Grimacing;Sharp Pain Intervention(s): Limited activity within patient's tolerance;Monitored during session     Hand Dominance     Extremity/Trunk Assessment Upper Extremity Assessment Upper Extremity Assessment: Overall WFL for tasks assessed;Generalized weakness   Lower Extremity Assessment Lower Extremity Assessment: Defer to PT evaluation   Cervical / Trunk Assessment Cervical / Trunk Assessment: Other exceptions;Kyphotic Cervical / Trunk Exceptions: increased body habitus   Communication Communication Communication: No difficulties   Cognition Arousal/Alertness: Awake/alert Behavior During Therapy: WFL for tasks assessed/performed Overall Cognitive Status: Within Functional Limits for tasks assessed       General Comments  no concerns noted - pain in breast area subsided     Home Living Family/patient expects to be discharged to:: Private  residence Living Arrangements: Spouse/significant other  Available Help at Discharge: Family Type of Home: House Home Access: Stairs to enter [employer] of Steps: 2 Entrance Stairs-Rails: Left Home Layout: One level     Bathroom Shower/Tub: Producer, television/film/video: Standard     Home Equipment: Environmental consultant - 2 wheels;Cane - single point;Grab bars - tub/shower;Shower seat   Additional Comments: pt reports they have a built in bench in her shower, it is too high, she has a shower seat she can use instead. Husband can assist physically if needed. Son and daughter in law are next door and assit with IADLs      Prior Functioning/Environment Level of Independence: Needs assistance  Gait / Transfers Assistance Needed: uses cane ADL's / Homemaking Assistance Needed: able to assist husband with IADL's i.e. cooking, adapts and sits and folds laundry Communication / Swallowing Assistance Needed: [name] Comments: Per pt report she has fallen twice since january; both outside of the home.        OT Problem List: Decreased activity tolerance;Impaired balance (sitting and/or standing);Decreased knowledge of use of DME or AE;Decreased knowledge of precautions;Decreased safety awareness;Pain      OT Treatment/Interventions:      OT Goals(Current goals can be found in the care plan section) Acute Rehab OT Goals Patient Stated Goal: remain indepedent, use cane vs walker OT Goal Formulation: With patient   AM-PAC OT "6 Clicks" Daily Activity     Outcome Measure Help from another person eating meals?: None Help from another person taking care of personal grooming?: A Little Help from another person toileting, which includes using toliet, bedpan, or urinal?: A Little Help from another person bathing (including washing, rinsing, drying)?: A Little Help from another person to put on and taking off regular upper body clothing?: A Little Help from another person to put on and taking off  regular lower body clothing?: A Lot 6 Click Score: 18   End of Session Equipment Utilized During Treatment: Gait belt;Rolling walker Nurse Communication: Mobility status  Activity Tolerance: Patient tolerated treatment well Patient left: in bed;with call bell/phone within reach  OT Visit Diagnosis: History of falling (Z91.81);Muscle weakness (generalized) (M62.81);Pain Pain - part of body:  (back)                Time: 2993-7169 OT Time Calculation (min): 20 min Charges:  OT General Charges $OT Visit: 1 Visit OT Evaluation $OT Eval Low Complexity: 1 Low    Fronia Depass A Isami Mehra 04/01/2021, 9:16 AM

## 2021-04-01 NOTE — Progress Notes (Signed)
Patient is discharged from room 3C10 at this time. Alert and in stable condition. IV site d/c'd and instructions read to patient and spouse with understanding verbalized and all questions answered. Left unit via wheelchair with all belongings at side. 

## 2021-04-01 NOTE — Progress Notes (Signed)
Physical Therapy Treatment Patient Details Name: [name] MRN: [id number] DOB: [birth year] Today's Date: 04/01/2021    History of Present Illness Pt is a 75 y.o. F who presents s/p T7 kyphoplasty in light of residual compression fracture. Significant PMH: T11 kyphoplasty, DVT, PE.    PT Comments    Pt progressing well with post-op mobility. She was able to demonstrate transfers and ambulation with gross min guard assist. Pt was educated on precautions, appropriate activity progression, and car transfer. Will continue to follow.      Follow Up Recommendations  No PT follow up;Supervision for mobility/OOB     Equipment Recommendations  None recommended by PT    Recommendations for Other Services       Precautions / Restrictions Precautions Precautions: Fall;Back Precaution Booklet Issued: Yes (comment) Precaution Comments: Back precautions for comfort Restrictions Weight Bearing Restrictions: No    Mobility  Bed Mobility Overal bed mobility: Needs Assistance             General bed mobility comments: Pt was received sitting up in recliner chair.    Transfers Overall transfer level: Needs assistance Equipment used: Rolling walker (2 wheeled) Transfers: Sit to/from Stand Sit to Stand: Min guard         General transfer comment: Close guard for power-up to full stand. No assist required however increased time taken.  Ambulation/Gait Ambulation/Gait assistance: Min guard Gait Distance (Feet): 200 Feet Assistive device: Straight cane Gait Pattern/deviations: Step-through pattern;Antalgic;Decreased stride length;Trunk flexed Gait velocity: decreased Gait velocity interpretation: <1.8 ft/sec, indicate of risk for recurrent falls General Gait Details: VC's for improved posture, forward gaze, and scapular retraction. Pt ambulating slow and mildly unsteady however no assist required.   Stairs             Wheelchair Mobility    Modified Rankin  (Stroke Patients Only)       Balance Overall balance assessment: Needs assistance Sitting-balance support: Feet supported Sitting balance-Leahy Scale: Fair     Standing balance support: Single extremity supported;During functional activity Standing balance-Leahy Scale: Poor Standing balance comment: reliant on external support                            Cognition Arousal/Alertness: Awake/alert Behavior During Therapy: WFL for tasks assessed/performed Overall Cognitive Status: Within Functional Limits for tasks assessed                                        Exercises      General Comments General comments (skin integrity, edema, etc.): no concerns noted - pain in breast area subsided      Pertinent Vitals/Pain Pain Assessment: Faces Faces Pain Scale: Hurts a little bit Pain Location: soreness throughout lower back Pain Descriptors / Indicators: Grimacing;Sharp Pain Intervention(s): Limited activity within patient's tolerance;Monitored during session;Repositioned    Home Living Family/patient expects to be discharged to:: Private residence Living Arrangements: Spouse/significant other Available Help at Discharge: Family Type of Home: House Home Access: Stairs to enter Entrance Stairs-Rails: Left Home Layout: One level Home Equipment: Environmental consultant - 2 wheels;Cane - single point;Grab bars - tub/shower;Shower seat Additional Comments: pt reports they have a built in bench in her shower, it is too high, she has a shower seat she can use instead. Husband can assist physically if needed. Son and daughter in law are next door and assit with  IADLs    Prior Function Level of Independence: Needs assistance  Gait / Transfers Assistance Needed: uses cane ADL's / Homemaking Assistance Needed: able to assist husband with IADL's i.e. cooking, adapts and sits and folds laundry Comments: Per pt report she has fallen twice since january; both outside of the home.    PT Goals (current goals can now be found in the care plan section) Acute Rehab PT Goals Patient Stated Goal: Be able to walk without pain. PT Goal Formulation: With patient Time For Goal Achievement: 04/14/21 Potential to Achieve Goals: Good Progress towards PT goals: Progressing toward goals    Frequency    Min 5X/week      PT Plan Current plan remains appropriate    Co-evaluation              AM-PAC PT "6 Clicks" Mobility   Outcome Measure  Help needed turning from your back to your side while in a flat bed without using bedrails?: None Help needed moving from lying on your back to sitting on the side of a flat bed without using bedrails?: A Little Help needed moving to and from a bed to a chair (including a wheelchair)?: A Little Help needed standing up from a chair using your arms (e.g., wheelchair or bedside chair)?: A Little Help needed to walk in hospital room?: A Little Help needed climbing 3-5 steps with a railing? : A Little 6 Click Score: 19    End of Session Equipment Utilized During Treatment: Gait belt Activity Tolerance: Patient tolerated treatment well Patient left: in bed;with call bell/phone within reach Nurse Communication: Mobility status PT Visit Diagnosis: Unsteadiness on feet (R26.81);Pain;Difficulty in walking, not elsewhere classified (R26.2) Pain - Right/Left: Right Pain - part of body:  (flank)     Time: 9485-4627 PT Time Calculation (min) (ACUTE ONLY): 21 min  Charges:  $Gait Training: 8-22 mins                     Conni Slipper, PT, DPT Acute Rehabilitation Services Pager: [phone] Office: [phone]    Marylynn Pearson 04/01/2021, 10:53 AM

## 2021-04-01 NOTE — Discharge Summary (Signed)
Physician Discharge Summary  Patient ID: [name] MRN: [id number] DOB/AGE: 1946-05-25 75 y.o.  Admit date: 03/31/2021 Discharge date: 04/01/2021  Admission Diagnoses: T7 compression fracture with delayed healing  Discharge Diagnoses: T7 compression fracture with delayed healing Active Problems:   Compression fracture of body of thoracic vertebra [facility])   Discharged Condition: fair  Hospital Course: Patient was admitted to undergo acrylic balloon kyphoplasty.  Postoperatively she woke up with severe right chest wall and shoulder pain.  CT scan was performed that showed no evidence of pneumothorax also showed kyphoplasty filling of the T7 vertebrae to the dorsal aspect of the vertebral body near the T7 foramen but not in it.  It is felt that this will create some irritability but should calm down on its own  Consults: None  Significant Diagnostic Studies: None  Treatments: surgery: See op note  Discharge Exam: Blood pressure (!) 146/75, pulse (!) 57, temperature 98.1 F (36.7 C), temperature source Oral, resp. rate 18, height 5' 2.5" (1.588 m), weight 119.3 kg, SpO2 92 %. Incision is clean and dry motor function appears intact.  Station and gait are intact.  Disposition: Discharge disposition: 01-Home or Self Care       Discharge Instructions     Call MD for:  redness, tenderness, or signs of infection (pain, swelling, redness, odor or green/yellow discharge around incision site)   Complete by: As directed    Call MD for:  severe uncontrolled pain   Complete by: As directed    Call MD for:  temperature >100.4   Complete by: As directed    Diet - low sodium heart healthy   Complete by: As directed    Incentive spirometry RT   Complete by: As directed    Increase activity slowly   Complete by: As directed    No dressing needed   Complete by: As directed       Allergies as of 04/01/2021       Reactions   Celecoxib Rash        Medication List     TAKE  these medications    ALPRAZolam 0.5 MG tablet Commonly known as: XANAX Take 0.5 mg by mouth 2 (two) times daily.   amLODipine 5 MG tablet Commonly known as: NORVASC Take 2.5 mg by mouth at bedtime.   atenolol 25 MG tablet Commonly known as: TENORMIN Take 25 mg by mouth See admin instructions. Take one tablet (25 mg) by mouth every morning, may also take a 2nd tablet (25 mg) if SBP >150   baclofen 10 MG tablet Commonly known as: LIORESAL Take 1 tablet (10 mg total) by mouth every 8 (eight) hours.   Calcium-Vitamin D 600-400 MG-UNIT Tabs Take 2 tablets by mouth daily. Gummies   cefdinir 300 MG capsule Commonly known as: OMNICEF Take 300 mg by mouth 2 (two) times daily.   escitalopram 5 MG tablet Commonly known as: LEXAPRO Take 5 mg by mouth at bedtime.   famotidine 40 MG tablet Commonly known as: PEPCID Take 40 mg by mouth at bedtime.   furosemide 20 MG tablet Commonly known as: LASIX Take 20 mg by mouth See admin instructions. Take one tablet (20 mg) by mouth Monday through Friday   gabapentin 600 MG tablet Commonly known as: NEURONTIN Take 600 mg by mouth 3 (three) times daily.   HYDROcodone-acetaminophen 5-325 MG tablet Commonly known as: NORCO/VICODIN Take 1 tablet by mouth every 4 (four) hours as needed for moderate pain. What changed:  when to  take this reasons to take this   levothyroxine 50 MCG tablet Commonly known as: SYNTHROID Take 25 mcg by mouth every Monday, Wednesday, and Friday.   losartan 50 MG tablet Commonly known as: COZAAR Take 50 mg by mouth 2 (two) times daily.   ondansetron 4 MG disintegrating tablet Commonly known as: ZOFRAN-ODT Take 4 mg by mouth 2 (two) times daily as needed for nausea or vomiting.   OVER THE COUNTER MEDICATION Take 2 tablets by mouth daily. Multivitamin plus Omega 3 supplement   OVER THE COUNTER MEDICATION Apply 1 application topically 3 (three) times daily as needed (joint pain). [place] Dream Cream- Muscle  Spasms   pantoprazole 40 MG tablet Commonly known as: PROTONIX Take 40 mg by mouth daily.   PROBIOTIC PO Take 1 capsule by mouth daily.   rivaroxaban 20 MG Tabs tablet Commonly known as: XARELTO Take 20 mg by mouth daily at 3 pm.   simvastatin 20 MG tablet Commonly known as: ZOCOR Take 20 mg by mouth at bedtime.   SYSTANE OP Place 1-2 drops into both eyes at bedtime as needed (for dry eyes).   vitamin C 250 MG tablet Commonly known as: ASCORBIC ACID Take 1,000 mg by mouth daily.               Discharge Care Instructions  (From admission, onward)           Start     Ordered   04/01/21 0000  No dressing needed        04/01/21 8527             Signed: Stefani Dama 04/01/2021, 9:15 AM

## 2021-04-01 NOTE — Discharge Instructions (Addendum)
Wound Care Leave incision open to air. You may shower. Do not scrub directly on incision.  Do not put any creams, lotions, or ointments on incision. Activity Walk each and every day, increasing distance each day. No lifting greater than 5 lbs.  Avoid excessive neck motion. No driving for 2 weeks; may ride as a passenger locally. Diet Resume your normal diet.  Return to Work Will be discussed at you follow up appointment. Call Your Doctor If Any of These Occur Redness, drainage, or swelling at the wound.  Temperature greater than 101 degrees. Severe pain not relieved by pain medication. Increased difficulty swallowing. Incision starts to come apart. Follow Up Appt Call today for appointment in 2-4 weeks ([phone]) or for problems.   

## 2021-04-01 NOTE — Progress Notes (Signed)
Patient ID: [name], female   DOB: 08/01/1946, 75 y.o.   MRN: [id number] CT scan reviewed from last night shows some cement near the dorsal aspect of the vertebral bodies on both sides perhaps more on the right side.  Foramen appears open.  I have advised at this point we should continue to observe [name] situation she is feeling somewhat better this morning and we will send her home on a prescription for hydrocodone.  Hopeful that her pain will settle down in time.

## 2021-04-10 DIAGNOSIS — S22060A Wedge compression fracture of T7-T8 vertebra, initial encounter for closed fracture: Secondary | ICD-10-CM | POA: Diagnosis not present

## 2021-04-13 ENCOUNTER — Other Ambulatory Visit: Payer: Self-pay | Admitting: Neurological Surgery

## 2021-04-15 NOTE — Pre-Procedure Instructions (Signed)
Surgical Instructions    Your procedure is scheduled on Friday July 1st.  Report to [facility] Main Entrance "A" at 05:30 A.M., then check in with the Admitting office.  Call this number if you have problems the morning of surgery:  [phone]   If you have any questions prior to your surgery date call [phone]: Open Monday-Friday 8am-4pm    Remember:  Do not eat or drink after midnight the night before your surgery     Take these medicines the morning of surgery with A SIP OF WATER   ALPRAZolam (XANAX)   atenolol (TENORMIN)  baclofen (LIORESAL)  esomeprazole (NEXIUM)   fluticasone (FLONASE)   gabapentin (NEURONTIN)   HYDROcodone-acetaminophen (NORCO/VICODIN)- If needed  levothyroxine (SYNTHROID, LEVOTHROID)  ondansetron (ZOFRAN-ODT)- If needed  Please follow your surgeon's instructions on when to stop taking Rivaroxaban (XARELTO). If you have not received instructions from your surgeon, please contact your surgeon's office for instructions.   As of today, STOP taking any Aspirin (unless otherwise instructed by your surgeon) Aleve, Naproxen, Ibuprofen, Motrin, Advil, Goody's, BC's, all herbal medications, fish oil, and all vitamins.                     Do NOT Smoke (Tobacco/Vaping) or drink Alcohol 24 hours prior to your procedure.  If you use a CPAP at night, you may bring all equipment for your overnight stay.   Contacts, glasses, piercing's, hearing aid's, dentures or partials may not be worn into surgery, please bring cases for these belongings.    For patients admitted to the hospital, discharge time will be determined by your treatment team.   Patients discharged the day of surgery will not be allowed to drive home, and someone needs to stay with them for 24 hours.  ONLY 1 SUPPORT PERSON MAY BE PRESENT WHILE YOU ARE IN SURGERY. IF YOU ARE TO BE ADMITTED ONCE YOU ARE IN YOUR ROOM YOU WILL BE ALLOWED TWO (2) VISITORS.  Minor children may have two parents present.  Special consideration for safety and communication needs will be reviewed on a case by case basis.   Special instructions:   [facility]- Preparing For Surgery  Before surgery, you can play an important role. Because skin is not sterile, your skin needs to be as free of germs as possible. You can reduce the number of germs on your skin by washing with CHG (chlorahexidine gluconate) Soap before surgery.  CHG is an antiseptic cleaner which kills germs and bonds with the skin to continue killing germs even after washing.    Oral Hygiene is also important to reduce your risk of infection.  Remember - BRUSH YOUR TEETH THE MORNING OF SURGERY WITH YOUR REGULAR TOOTHPASTE  Please do not use if you have an allergy to CHG or antibacterial soaps. If your skin becomes reddened/irritated stop using the CHG.  Do not shave (including legs and underarms) for at least 48 hours prior to first CHG shower. It is OK to shave your face.  Please follow these instructions carefully.   Shower the NIGHT BEFORE SURGERY and the MORNING OF SURGERY  If you chose to wash your hair, wash your hair first as usual with your normal shampoo.  After you shampoo, rinse your hair and body thoroughly to remove the shampoo.  Use CHG Soap as you would any other liquid soap. You can apply CHG directly to the skin and wash gently with a scrungie or a clean washcloth.   Apply the CHG  Soap to your body ONLY FROM THE NECK DOWN.  Do not use on open wounds or open sores. Avoid contact with your eyes, ears, mouth and genitals (private parts). Wash Face and genitals (private parts)  with your normal soap.   Wash thoroughly, paying special attention to the area where your surgery will be performed.  Thoroughly rinse your body with warm water from the neck down.  DO NOT shower/wash with your normal soap after using and rinsing off the CHG Soap.  Pat yourself dry with a CLEAN TOWEL.  Wear CLEAN PAJAMAS to bed the night before  surgery  Place CLEAN SHEETS on your bed the night before your surgery  DO NOT SLEEP WITH PETS.   Day of Surgery: Shower with CHG soap. Do not wear jewelry, make up, nail polish, gel polish, artificial nails, or any other type of covering on natural nails including finger and toenails. If patients have artificial nails, gel coating, etc. that need to be removed by a nail salon please have this removed prior to surgery. Surgery may need to be canceled/delayed if the surgeon/ anesthesia feels like the patient is unable to be adequately monitored. Do not wear lotions, powders, perfumes/colognes, or deodorant. Do not shave 48 hours prior to surgery.  Men may shave face and neck. Do not bring valuables to the hospital. [facility] is not responsible for any belongings or valuables. Wear Clean/Comfortable clothing the morning of surgery Remember to brush your teeth WITH YOUR REGULAR TOOTHPASTE.   Please read over the following fact sheets that you were given.

## 2021-04-16 ENCOUNTER — Encounter (HOSPITAL_COMMUNITY)
Admission: RE | Admit: 2021-04-16 | Discharge: 2021-04-16 | Disposition: A | Discharge status: Home / Self Care | Payer: PPO | Source: Ambulatory Visit | Attending: Neurological Surgery | Admitting: Neurological Surgery

## 2021-04-16 ENCOUNTER — Other Ambulatory Visit: Payer: Self-pay

## 2021-04-16 ENCOUNTER — Encounter (HOSPITAL_COMMUNITY): Payer: Self-pay

## 2021-04-16 DIAGNOSIS — Z01812 Encounter for preprocedural laboratory examination: Secondary | ICD-10-CM | POA: Insufficient documentation

## 2021-04-16 DIAGNOSIS — U071 COVID-19: Secondary | ICD-10-CM | POA: Insufficient documentation

## 2021-04-16 LAB — CBC
HCT: 35.3 % — ABNORMAL LOW (ref 36.0–46.0)
Hemoglobin: 11.1 g/dL — ABNORMAL LOW (ref 12.0–15.0)
MCH: 29.4 pg (ref 26.0–34.0)
MCHC: 31.4 g/dL (ref 30.0–36.0)
MCV: 93.6 fL (ref 80.0–100.0)
Platelets: 229 10*3/uL (ref 150–400)
RBC: 3.77 MIL/uL — ABNORMAL LOW (ref 3.87–5.11)
RDW: 12.6 % (ref 11.5–15.5)
WBC: 6.6 10*3/uL (ref 4.0–10.5)
nRBC: 0 % (ref 0.0–0.2)

## 2021-04-16 LAB — BASIC METABOLIC PANEL
Anion gap: 8 (ref 5–15)
BUN: 25 mg/dL — ABNORMAL HIGH (ref 8–23)
CO2: 27 mmol/L (ref 22–32)
Calcium: 9.9 mg/dL (ref 8.9–10.3)
Chloride: 101 mmol/L (ref 98–111)
Creatinine, Ser: 1.32 mg/dL — ABNORMAL HIGH (ref 0.44–1.00)
GFR, Estimated: 42 mL/min — ABNORMAL LOW (ref >60)
Glucose, Bld: 103 mg/dL — ABNORMAL HIGH (ref 70–99)
Potassium: 5.4 mmol/L — ABNORMAL HIGH (ref 3.5–5.1)
Sodium: 136 mmol/L (ref 135–145)

## 2021-04-16 LAB — SARS CORONAVIRUS 2 BY RT PCR (HOSPITAL ORDER, PERFORMED IN ~~LOC~~ HOSPITAL LAB): SARS Coronavirus 2: POSITIVE — AB

## 2021-04-16 NOTE — Progress Notes (Addendum)
PCP - Dr. Creola Corn Cardiologist - Verdis Prime MD  PPM/ICD - denies Device Orders -  Rep Notified -   Chest x-ray -  EKG - 02/20/21 Stress Test - 08/05/16 ECHO - 01/12/18 Cardiac Cath - denies  Sleep Study - denies CPAP - no  Fasting Blood Sugar - n/a Checks Blood Sugar _____ times a day  Blood Thinner Instructions:last dose Xarelto 04/14/21 per Dr. Verlee Rossetti instructions Aspirin Instructions:n/a  ERAS Protcol -n/a PRE-SURGERY Ensure or G2-   COVID TEST- 04/16/21   Anesthesia review: no  Patient denies shortness of breath, fever, cough and chest pain at PAT appointment   All instructions explained to the patient, with a verbal understanding of the material. Patient agrees to go over the instructions while at home for a better understanding. Patient also instructed to self quarantine after being tested for COVID-19. The opportunity to ask questions was provided.

## 2021-04-16 NOTE — Progress Notes (Addendum)
Anesthesia Chart Review:  Case: [id number] Date/Time: 04/17/21 0715   Procedure: Right Thoracic 7-8 Extraforaminal decompression/foraminotomy (Right: Back)   Anesthesia type: General   Pre-op diagnosis: Pain in thoracic spine   [facility]: [facility] ROOM 18 / [facility]   Surgeons: Barnett Abu, MD       DISCUSSION: Patient is a 75 year old female scheduled for the above procedure. She is s/p T7 kyphoplasty on 03/03/21 and 03/31/21 for delayed healing of compression fracture.   History includes non-smoker, DVT/PE (LLE DVT; post-op RUL/RLL PE 12/11/06; IVC filter 12/11/06), venous aneurysm right IJ (s/p resection 08/10/13), PAD, heart murmur (trivial MR 04/2013 echo; mild AS 12/2017 echo), hypothyroidism, anemia, renal insufficiency, claustrophobia, panic attacks, TKA (left 12/30/09, right TKA 10/22/04, revision 08/16/06), THA (left THA revision 05/05/10, 02/16/11), right reverse total shoulder (11/30/10), back surgery (L3-4 PLIF/posterolateral arthrodesis 10/29/02; T10-L2 arthrodesis 12/01/06, complicated by epidural hematoma s/p decompression left L2 12/06/06; T7 kyphoplasty 03/03/21 & 03/31/21), neck surgery (C3-4 ACDF 08/22/08). 2019 Coronary CT showed mild CAD proximal LAD and distal RCA with no significant stenosis by FFR. BMI is consistent with morbid obesity.     For anesthesia history she reported post-operative N/V and hard time waking up from cholecystectomy > 25 years ago.    Mild AS and no significant CAD by testing in 2019. Last cardiology evaluation on 02/23/21 with Edd Fabian, NP. She was felt "acceptable" risk for kyphoplasty. She is on Xarelto (DVT/PE history). Last Xarelto 04/14/21.   04/16/21 preprocedure COVID-19 test is in process. Of note, she told PAT RN that she had a + home COVID-19 test on 04/02/21 for a cough, but denied symptoms in last 10 days. (UPDATE 04/16/21 4:31 PM: PAT RN reported call from lab indicated 04/16/21 PCR Covid test was +, although result not yet posted. She has notified Sherian Rein, RN  who will call and discuss with Dr. Danielle Dess).     VS: BP 126/61   Pulse (!) 56   Temp 36.8 C (Oral)   Resp 18   Ht 5\' 3"  (1.6 m)   Wt 121.5 kg   LMP  (LMP Unknown)   SpO2 92%   BMI 47.46 kg/m    PROVIDERS: , MD/Hyatt, Creola Corn, NP are PCP [facility]). [facility], MD is primary cardiologist. As needed follow-up recommended 12/12/18. Last visit on 02/23/21 with 04/25/21, NP for preoperative evaluation. She was felt "acceptable" risk for that procedure. 3-6 month follow-up planned.   LABS: CBC and BMET repeated since 03/31/21 labs were prior to kyphoplasty and showed an elevated Cr at 1.68.  Cr now 1.32. H/H stable at 11.1/35.3.  (all labs ordered are listed, but only abnormal results are displayed)  Labs Reviewed  CBC - Abnormal; Notable for the following components:      Result Value   RBC 3.77 (*)    Hemoglobin 11.1 (*)    HCT 35.3 (*)    All other components within normal limits  BASIC METABOLIC PANEL - Abnormal; Notable for the following components:   Potassium 5.4 (*)    Glucose, Bld 103 (*)    BUN 25 (*)    Creatinine, Ser 1.32 (*)    GFR, Estimated 42 (*)    All other components within normal limits  SARS CORONAVIRUS 2 BY RT PCR (HOSPITAL ORDER, PERFORMED IN [facility])    EKG: 02/20/21: SB at 52 bpm. LVH.   CV: Coronary CT 08/19/18: IMPRESSION: 1. The patient's coronary artery calcium score is 135, which places  the patient in the 72 percentile. This is greater than expected for age and gender matched peers. 2. Normal coronary origin with right dominance. 3. Mild CAD, CADRADS = 2. Mild stenosis of the Proximal LAD and the distal RCA.   FFR 08/19/18: 1. Left Main:  No significant stenosis. FFR =0.98 2. LAD: No significant stenosis. Proximal FFR = 0.97, Mid FFR = 0.97, Distal FFR = 0.91 3. LCX: No significant stenosis. Proximal FFR = 0.95, Distal FFR = 0.90 4. RCA: No significant stenosis. Proximal FFR =0.99, Mid FFR  = 0.96, Distal FFR = 0.95, PDA FFR = 0.90, PL proximal FFR = 0.94, PL distal FFR = 0.76 IMPRESSION: 1. CT FFR analysis showed no significant proximal stenosis. Distal posterolateral branch FFR = 0.76.     Echo 01/12/18: Study Conclusions  - Left ventricle: The cavity size was normal. Wall thickness was    normal. Systolic function was normal. The estimated ejection    fraction was in the range of 55% to 60%.  - Aortic valve: AV is difficult to see well It is mildly thickened.    Peak and mean gradients through the valve are 24 and 11 mm Hg    respectively consistent with mild AS. Valve area (VTI): 1.47 cm^2.     Nuclear stress test 08/05/16: The left ventricular ejection fraction is normal (55-65%). Nuclear stress EF: 63%. There was no ST segment deviation noted during stress. Defect 1: There is a large defect of moderate severity present in the basal inferior, mid inferior, mid inferolateral, apical inferior, apical lateral and apex location. This defect is likely due to artifact / chest wall attenuation. The study is normal. This is a low risk study.     Cardiac cath on 01/17/08 showed: 1. Essentially normal coronaries. A minimal insignificant obstruction (20%) is present in the mid-portion of the right coronary.  EF 65-70%. 2. Systemic hypertension. 3. Elevated left ventricular end-diastolic pressure. 4. Minimal atherosclerotic disease of both renal arteries, but each is widely patent. 5. Non-cardiac chest pain.    Past Medical History:  Diagnosis Date   Anemia    Arthritis    osteoarthritis   Claustrophobia    Complication of anesthesia    hard time waking up after her gallbladder surgery 28 years ago   DVT (deep venous thrombosis) (HCC)    left leg   GERD (gastroesophageal reflux disease)    hx of   Heart murmur    Dr. Katrinka Blazing follows, 'nothing to worry about.'   History of blood transfusion    History of hiatal hernia    small   History of kidney stones     Hypercholesteremia    under control   Hypertension    Hypothyroidism    Mild aortic stenosis    a. by echo 12/2017.   Panic attack    PONV (postoperative nausea and vomiting)    Pulmonary embolism (HCC)    bilateral lungs - 2008 after disc surgery    Past Surgical History:  Procedure Laterality Date   ABDOMINAL HYSTERECTOMY  10/18/1973   partial   APPENDECTOMY  10/19/1971   BACK SURGERY     [id number]   CARDIAC CATHETERIZATION  ~2010   CARPAL TUNNEL RELEASE Bilateral    CHOLECYSTECTOMY  10/19/1995   COLONOSCOPY     COLONOSCOPY WITH PROPOFOL N/A 12/05/2017   Procedure: COLONOSCOPY WITH PROPOFOL;  Surgeon: Carman Ching, MD;  Location: [facility];  Service: Endoscopy;  Laterality: N/A;   DILATION AND CURETTAGE OF  UTERUS     ENDARTERECTOMY Right 08/10/2013   Procedure: Excision of Venous Aneurysm Right Neck;  Surgeon: Larina Earthly, MD;  Location: [facility] OR;  Service: Vascular;  Laterality: Right;   ESOPHAGOGASTRODUODENOSCOPY (EGD) WITH PROPOFOL N/A 12/05/2017   Procedure: ESOPHAGOGASTRODUODENOSCOPY (EGD) WITH PROPOFOL;  Surgeon: Carman Ching, MD;  Location: [facility];  Service: Endoscopy;  Laterality: N/A;   filter Left 10/19/2007   IVC   FRACTURE SURGERY Left    left wrist   HIP SURGERY Left 10/18/2006   JOINT REPLACEMENT Right 11/19/2003   (knee)multiple surgeries   JOINT REPLACEMENT Left 12/16/2009   (knee)   JOINT REPLACEMENT Left 2011, 2012   Hip   KYPHOPLASTY N/A 03/03/2021   Procedure: Thoracic seven Kyphoplasty;  Surgeon: Barnett Abu, MD;  Location: [facility] OR;  Service: Neurosurgery;  Laterality: N/A;   KYPHOPLASTY N/A 03/31/2021   Procedure: Thoracic seven Kyphoplasty;  Surgeon: Barnett Abu, MD;  Location: [facility] OR;  Service: Neurosurgery;  Laterality: N/A;   MASS EXCISION  10/18/1972   uterus   QUADRICEPS TENDON REPAIR Right 12/06/2016   Procedure: QUAD TENDON RECONSTRUCTION;  Surgeon: Durene Romans, MD;  Location: [facility];  Service: Orthopedics;  Laterality:  Right;  Requests for 90 mins   RADIOLOGY WITH ANESTHESIA N/A 01/20/2021   Procedure: MRI WITH [facility] PAIN WITHOUT CONTRAST;  Surgeon: Radiologist, Medication, MD;  Location: [facility] OR;  Service: Radiology;  Laterality: N/A;   TONSILLECTOMY  10/18/1984   TOTAL HIP REVISION Left 12/07/2013   Procedure:  REVISION  CONSTRAINED LINER LEFT TOTAL HIP ;  Surgeon: Shelda Pal, MD;  Location: [facility];  Service: Orthopedics;  Laterality: Left;   TOTAL SHOULDER REPLACEMENT Right 11/30/2010   TUBAL LIGATION  10/19/1971    MEDICATIONS:  ALPRAZolam (XANAX) 0.5 MG tablet   amLODipine (NORVASC) 5 MG tablet   atenolol (TENORMIN) 25 MG tablet   Bacillus Coagulans-Inulin (PROBIOTIC-PREBIOTIC PO)   baclofen (LIORESAL) 10 MG tablet   Calcium Carb-Cholecalciferol (CALCIUM-VITAMIN D) 600-400 MG-UNIT TABS   escitalopram (LEXAPRO) 5 MG tablet   esomeprazole (NEXIUM) 40 MG capsule   famotidine (PEPCID) 40 MG tablet   fluticasone (FLONASE) 50 MCG/ACT nasal spray   furosemide (LASIX) 20 MG tablet   gabapentin (NEURONTIN) 600 MG tablet   Histamine Dihydrochloride (AUSTRALIAN DREAM ARTHRITIS) 0.025 % CREA   HYDROcodone-acetaminophen (NORCO/VICODIN) 5-325 MG tablet   levothyroxine (SYNTHROID, LEVOTHROID) 50 MCG tablet   losartan (COZAAR) 50 MG tablet   Multiple Vitamins-Minerals (MULTI + OMEGA-3 ADULT GUMMIES PO)   ondansetron (ZOFRAN-ODT) 4 MG disintegrating tablet   Polyethyl Glycol-Propyl Glycol (SYSTANE OP)   Rivaroxaban (XARELTO) 20 MG TABS tablet   simvastatin (ZOCOR) 20 MG tablet   vitamin C (ASCORBIC ACID) 250 MG tablet   No current facility-administered medications for this encounter.    Shonna Chock, PA-C Surgical Short Stay/Anesthesiology [facility] Phone [phone] [facility] Phone [phone] 04/16/2021 3:30 PM

## 2021-04-16 NOTE — Progress Notes (Signed)
TC to [facility] @ Dr Verlee Rossetti office to notify of positive result. Awaiting response from [email or web address] proceeding as urgent/emergent case

## 2021-04-16 NOTE — Progress Notes (Signed)
Received report  from [facility] that pt's Covid test done today came back positive. During PAT visit, pt reported having a positive result from a home Covid test on 04/02/21. She did not go to a doctor or get tested anywhere else after that date. She stated that a cough was her symptom.She says she has had no symptoms in the last 10 days. After consulting with Antionette Poles PA , Covid test was performed today since we had no verified result of a Covid test since 04/02/21. Message left with scheduler at Dr. Verlee Rossetti office. Also reported to anesthesia PA's.

## 2021-04-16 NOTE — Anesthesia Preprocedure Evaluation (Addendum)
Anesthesia Evaluation  Patient identified by MRN, date of birth, ID band Patient awake    Reviewed: Allergy & Precautions, NPO status , Patient's Chart, lab work & pertinent test results, reviewed documented beta blocker date and time   History of Anesthesia Complications (+) PONV and history of anesthetic complications  Airway Mallampati: II  TM Distance: >3 FB Neck ROM: Full    Dental no notable dental hx.    Pulmonary neg pulmonary ROS, PE (xarelto) COVID positive at home 6/16 w/ cough, hasn't had symptoms in last 10d. Retested yesterday as part of inpatient protocol and test is still positive, pt asymptomatic   Pulmonary exam normal breath sounds clear to auscultation       Cardiovascular hypertension, Pt. on medications and Pt. on home beta blockers + CAD and + DVT  + Valvular Problems/Murmurs (mild AS) AS  Rhythm:Regular Rate:Normal + Systolic murmurs Echo 2019: - Left ventricle: The cavity size was normal. Wall thickness was  normal. Systolic function was normal. The estimated ejection  fraction was in the range of 55% to 60%.  - Aortic valve: AV is difficult to see well It is mildly thickened.  Peak and mean gradients through the valve are 24 and 11 mm Hg  respectively consistent with mild AS.    Neuro/Psych PSYCHIATRIC DISORDERS Anxiety negative neurological ROS  negative psych ROS   GI/Hepatic Neg liver ROS, hiatal hernia, GERD  Medicated and Controlled,  Endo/Other  Hypothyroidism Morbid obesityBMI 47  Renal/GU Renal InsufficiencyRenal diseaseCr 1.32, CKD 2  negative genitourinary   Musculoskeletal negative musculoskeletal ROS (+) Arthritis , Osteoarthritis,  Thoracic spine pain Takes norco 5/325-    Abdominal   Peds negative pediatric ROS (+)  Hematology  (+) Blood dyscrasia, anemia , hct 35.3   Anesthesia Other Findings   Reproductive/Obstetrics negative OB ROS                            Anesthesia Physical Anesthesia Plan  ASA: 3  Anesthesia Plan: General   Post-op Pain Management:    Induction: Intravenous  PONV Risk Score and Plan: 4 or greater and Ondansetron, Dexamethasone and Treatment may vary due to age or medical condition  Airway Management Planned: Oral ETT  Additional Equipment:   Intra-op Plan:   Post-operative Plan: Extubation in OR  Informed Consent: I have reviewed the patients History and Physical, chart, labs and discussed the procedure including the risks, benefits and alternatives for the proposed anesthesia with the patient or authorized representative who has indicated his/her understanding and acceptance.     Dental advisory given  Plan Discussed with: CRNA and Surgeon  Anesthesia Plan Comments: (PAT note written 04/16/2021 by Shonna Chock, PA-C.  Patient reported + home COVID-19 test on 04/02/21 for a cough, but denied symptoms in last 10 days. 04/16/21 4:31 PM: PAT RN reported call from lab indicated 04/16/21 PCR Covid test was +, although result not yet posted. She has notified Sherian Rein, RN who will call and discuss with Dr. Danielle Dess.  )      Anesthesia Quick Evaluation

## 2021-04-16 NOTE — Progress Notes (Signed)
Notified Sherian Rein of pt's positive Covid test result and told her that pt is scheduled to come in tomorrow morning at 0530. Pt's surgery is at 0730. Pam will reach out to Dr. Danielle Dess. Antionette Poles PA will try to reach Dr. Verlee Rossetti office.

## 2021-04-17 ENCOUNTER — Ambulatory Visit (HOSPITAL_COMMUNITY): Payer: PPO | Admitting: Vascular Surgery

## 2021-04-17 ENCOUNTER — Encounter (HOSPITAL_COMMUNITY)
Admission: RE | Disposition: A | Discharge status: Home / Self Care | Payer: Self-pay | Source: Home / Self Care | Attending: Neurological Surgery

## 2021-04-17 ENCOUNTER — Observation Stay (HOSPITAL_COMMUNITY)
Admission: RE | Admit: 2021-04-17 | Discharge: 2021-04-17 | Disposition: A | Discharge status: Home / Self Care | Payer: PPO | Attending: Neurological Surgery | Admitting: Neurological Surgery

## 2021-04-17 ENCOUNTER — Ambulatory Visit (HOSPITAL_COMMUNITY): Payer: PPO

## 2021-04-17 ENCOUNTER — Encounter (HOSPITAL_COMMUNITY): Payer: Self-pay | Admitting: Neurological Surgery

## 2021-04-17 ENCOUNTER — Ambulatory Visit (HOSPITAL_COMMUNITY): Payer: PPO | Admitting: Certified Registered"

## 2021-04-17 DIAGNOSIS — Z96643 Presence of artificial hip joint, bilateral: Secondary | ICD-10-CM | POA: Insufficient documentation

## 2021-04-17 DIAGNOSIS — M546 Pain in thoracic spine: Secondary | ICD-10-CM | POA: Diagnosis not present

## 2021-04-17 DIAGNOSIS — Z96611 Presence of right artificial shoulder joint: Secondary | ICD-10-CM | POA: Diagnosis not present

## 2021-04-17 DIAGNOSIS — T8189XA Other complications of procedures, not elsewhere classified, initial encounter: Secondary | ICD-10-CM | POA: Diagnosis not present

## 2021-04-17 DIAGNOSIS — M5414 Radiculopathy, thoracic region: Secondary | ICD-10-CM | POA: Diagnosis not present

## 2021-04-17 DIAGNOSIS — Z79899 Other long term (current) drug therapy: Secondary | ICD-10-CM | POA: Diagnosis not present

## 2021-04-17 DIAGNOSIS — E039 Hypothyroidism, unspecified: Secondary | ICD-10-CM | POA: Insufficient documentation

## 2021-04-17 DIAGNOSIS — Z419 Encounter for procedure for purposes other than remedying health state, unspecified: Secondary | ICD-10-CM

## 2021-04-17 DIAGNOSIS — I10 Essential (primary) hypertension: Secondary | ICD-10-CM | POA: Insufficient documentation

## 2021-04-17 DIAGNOSIS — M4854XA Collapsed vertebra, not elsewhere classified, thoracic region, initial encounter for fracture: Secondary | ICD-10-CM | POA: Diagnosis not present

## 2021-04-17 DIAGNOSIS — Z8616 Personal history of COVID-19: Secondary | ICD-10-CM | POA: Diagnosis not present

## 2021-04-17 DIAGNOSIS — Z7901 Long term (current) use of anticoagulants: Secondary | ICD-10-CM | POA: Diagnosis not present

## 2021-04-17 DIAGNOSIS — S22069A Unspecified fracture of T7-T8 vertebra, initial encounter for closed fracture: Secondary | ICD-10-CM | POA: Diagnosis not present

## 2021-04-17 DIAGNOSIS — Z96653 Presence of artificial knee joint, bilateral: Secondary | ICD-10-CM | POA: Diagnosis not present

## 2021-04-17 DIAGNOSIS — Z981 Arthrodesis status: Secondary | ICD-10-CM | POA: Diagnosis not present

## 2021-04-17 DIAGNOSIS — E7800 Pure hypercholesterolemia, unspecified: Secondary | ICD-10-CM | POA: Diagnosis not present

## 2021-04-17 DIAGNOSIS — D638 Anemia in other chronic diseases classified elsewhere: Secondary | ICD-10-CM | POA: Diagnosis not present

## 2021-04-17 HISTORY — PX: LUMBAR LAMINECTOMY/ DECOMPRESSION WITH MET-RX: SHX5959

## 2021-04-17 SURGERY — LUMBAR LAMINECTOMY/ DECOMPRESSION WITH MET-RX
Anesthesia: General | Site: Spine Thoracic | Laterality: Right

## 2021-04-17 MED ORDER — LIDOCAINE-EPINEPHRINE 1 %-1:100000 IJ SOLN
INTRAMUSCULAR | Status: DC | PRN
  Administered 2021-04-17: 5 mL

## 2021-04-17 MED ORDER — LACTATED RINGERS IV SOLN: INTRAVENOUS | Status: DC | PRN

## 2021-04-17 MED ORDER — ONDANSETRON HCL 4 MG PO TABS: 4.0000 mg | ORAL_TABLET | Freq: Four times a day (QID) | ORAL | Status: DC | PRN

## 2021-04-17 MED ORDER — METHOCARBAMOL 500 MG PO TABS
500.0000 mg | ORAL_TABLET | Freq: Four times a day (QID) | ORAL | Status: DC | PRN
  Administered 2021-04-17: 500 mg via ORAL

## 2021-04-17 MED ORDER — GLYCOPYRROLATE PF 0.2 MG/ML IJ SOSY
PREFILLED_SYRINGE | INTRAMUSCULAR | Status: AC
  Filled 2021-04-17: qty 1

## 2021-04-17 MED ORDER — LOSARTAN POTASSIUM 50 MG PO TABS: 50.0000 mg | ORAL_TABLET | Freq: Two times a day (BID) | ORAL | Status: DC

## 2021-04-17 MED ORDER — BISACODYL 10 MG RE SUPP: 10.0000 mg | Freq: Every day | RECTAL | Status: DC | PRN

## 2021-04-17 MED ORDER — POLYETHYLENE GLYCOL 3350 17 G PO PACK: 17.0000 g | PACK | Freq: Every day | ORAL | Status: DC | PRN

## 2021-04-17 MED ORDER — SUFENTANIL CITRATE 50 MCG/ML IV SOLN
INTRAVENOUS | Status: DC | PRN
  Administered 2021-04-17 (×2): 10 ug via INTRAVENOUS

## 2021-04-17 MED ORDER — DEXAMETHASONE SODIUM PHOSPHATE 10 MG/ML IJ SOLN
INTRAMUSCULAR | Status: AC
  Filled 2021-04-17: qty 1

## 2021-04-17 MED ORDER — ATENOLOL 25 MG PO TABS: 25.0000 mg | ORAL_TABLET | ORAL | Status: DC

## 2021-04-17 MED ORDER — SUFENTANIL CITRATE 50 MCG/ML IV SOLN
INTRAVENOUS | Status: AC
  Filled 2021-04-17: qty 1

## 2021-04-17 MED ORDER — AMLODIPINE BESYLATE 2.5 MG PO TABS: 2.5000 mg | ORAL_TABLET | Freq: Every day | ORAL | Status: DC

## 2021-04-17 MED ORDER — GLYCOPYRROLATE PF 0.2 MG/ML IJ SOSY
PREFILLED_SYRINGE | INTRAMUSCULAR | Status: DC | PRN
  Administered 2021-04-17: .2 mg via INTRAVENOUS

## 2021-04-17 MED ORDER — POLYVINYL ALCOHOL 1.4 % OP SOLN: 1.0000 [drp] | Freq: Two times a day (BID) | OPHTHALMIC | Status: DC | PRN

## 2021-04-17 MED ORDER — BUPIVACAINE HCL (PF) 0.5 % IJ SOLN
INTRAMUSCULAR | Status: AC
  Filled 2021-04-17: qty 30

## 2021-04-17 MED ORDER — ONDANSETRON 4 MG PO TBDP: 4.0000 mg | ORAL_TABLET | Freq: Two times a day (BID) | ORAL | Status: DC | PRN

## 2021-04-17 MED ORDER — 0.9 % SODIUM CHLORIDE (POUR BTL) OPTIME
TOPICAL | Status: DC | PRN
  Administered 2021-04-17: 1000 mL

## 2021-04-17 MED ORDER — CEFAZOLIN SODIUM-DEXTROSE 2-4 GM/100ML-% IV SOLN
2.0000 g | Freq: Three times a day (TID) | INTRAVENOUS | Status: DC
  Administered 2021-04-17: 2 g via INTRAVENOUS
  Filled 2021-04-17 (×2): qty 100

## 2021-04-17 MED ORDER — BACLOFEN 10 MG PO TABS
10.0000 mg | ORAL_TABLET | Freq: Three times a day (TID) | ORAL | Status: DC
  Administered 2021-04-17: 10 mg via ORAL
  Filled 2021-04-17: qty 1

## 2021-04-17 MED ORDER — GABAPENTIN 600 MG PO TABS
600.0000 mg | ORAL_TABLET | Freq: Three times a day (TID) | ORAL | Status: DC
  Administered 2021-04-17: 600 mg via ORAL
  Filled 2021-04-17: qty 1

## 2021-04-17 MED ORDER — DEXAMETHASONE SODIUM PHOSPHATE 10 MG/ML IJ SOLN
INTRAMUSCULAR | Status: DC | PRN
  Administered 2021-04-17: 10 mg via INTRAVENOUS

## 2021-04-17 MED ORDER — FUROSEMIDE 20 MG PO TABS
20.0000 mg | ORAL_TABLET | ORAL | Status: DC
  Administered 2021-04-17: 20 mg via ORAL
  Filled 2021-04-17: qty 1

## 2021-04-17 MED ORDER — SIMVASTATIN 20 MG PO TABS: 20.0000 mg | ORAL_TABLET | Freq: Every day | ORAL | Status: DC

## 2021-04-17 MED ORDER — SODIUM CHLORIDE 0.9 % IV SOLN: 250.0000 mL | INTRAVENOUS | Status: DC

## 2021-04-17 MED ORDER — ESCITALOPRAM OXALATE 10 MG PO TABS: 5.0000 mg | ORAL_TABLET | Freq: Every day | ORAL | Status: DC

## 2021-04-17 MED ORDER — CEFAZOLIN SODIUM-DEXTROSE 2-3 GM-%(50ML) IV SOLR
INTRAVENOUS | Status: DC | PRN
  Administered 2021-04-17: 2 g via INTRAVENOUS

## 2021-04-17 MED ORDER — PHENYLEPHRINE HCL-NACL 10-0.9 MG/250ML-% IV SOLN
INTRAVENOUS | Status: DC | PRN
  Administered 2021-04-17: 25 ug/min via INTRAVENOUS

## 2021-04-17 MED ORDER — CALCIUM CARBONATE-VITAMIN D 500-200 MG-UNIT PO TABS: 2.0000 | ORAL_TABLET | Freq: Every day | ORAL | Status: DC

## 2021-04-17 MED ORDER — PROPOFOL 10 MG/ML IV BOLUS
INTRAVENOUS | Status: AC
  Filled 2021-04-17: qty 20

## 2021-04-17 MED ORDER — THROMBIN 5000 UNITS EX SOLR: OROMUCOSAL | Status: DC | PRN

## 2021-04-17 MED ORDER — SODIUM CHLORIDE 0.9% FLUSH: 3.0000 mL | INTRAVENOUS | Status: DC | PRN

## 2021-04-17 MED ORDER — ONDANSETRON HCL 4 MG/2ML IJ SOLN: 4.0000 mg | Freq: Four times a day (QID) | INTRAMUSCULAR | Status: DC | PRN

## 2021-04-17 MED ORDER — CHLORHEXIDINE GLUCONATE 0.12 % MT SOLN: 15.0000 mL | Freq: Once | OROMUCOSAL | Status: AC

## 2021-04-17 MED ORDER — PANTOPRAZOLE SODIUM 40 MG PO TBEC: 80.0000 mg | DELAYED_RELEASE_TABLET | Freq: Every day | ORAL | Status: DC

## 2021-04-17 MED ORDER — CHLORHEXIDINE GLUCONATE CLOTH 2 % EX PADS: 6.0000 | MEDICATED_PAD | Freq: Once | CUTANEOUS | Status: DC

## 2021-04-17 MED ORDER — ATENOLOL 25 MG PO TABS: 25.0000 mg | ORAL_TABLET | Freq: Every day | ORAL | Status: DC

## 2021-04-17 MED ORDER — METHOCARBAMOL 500 MG PO TABS
ORAL_TABLET | ORAL | Status: AC
  Filled 2021-04-17: qty 1

## 2021-04-17 MED ORDER — OXYCODONE HCL 5 MG PO TABS
ORAL_TABLET | ORAL | Status: AC
  Administered 2021-04-17: 5 mg
  Filled 2021-04-17: qty 1

## 2021-04-17 MED ORDER — EPHEDRINE SULFATE-NACL 50-0.9 MG/10ML-% IV SOSY
PREFILLED_SYRINGE | INTRAVENOUS | Status: DC | PRN
  Administered 2021-04-17: 15 mg via INTRAVENOUS
  Administered 2021-04-17: 10 mg via INTRAVENOUS

## 2021-04-17 MED ORDER — ALPRAZOLAM 0.5 MG PO TABS: 0.5000 mg | ORAL_TABLET | Freq: Two times a day (BID) | ORAL | Status: DC

## 2021-04-17 MED ORDER — ONDANSETRON HCL 4 MG/2ML IJ SOLN
INTRAMUSCULAR | Status: DC | PRN
  Administered 2021-04-17: 4 mg via INTRAVENOUS

## 2021-04-17 MED ORDER — ORAL CARE MOUTH RINSE: 15.0000 mL | Freq: Once | OROMUCOSAL | Status: AC

## 2021-04-17 MED ORDER — ONDANSETRON HCL 4 MG/2ML IJ SOLN
INTRAMUSCULAR | Status: AC
  Filled 2021-04-17: qty 2

## 2021-04-17 MED ORDER — FLEET ENEMA 7-19 GM/118ML RE ENEM: 1.0000 | ENEMA | Freq: Once | RECTAL | Status: DC | PRN

## 2021-04-17 MED ORDER — ONDANSETRON HCL 4 MG/2ML IJ SOLN: 4.0000 mg | Freq: Once | INTRAMUSCULAR | Status: DC | PRN

## 2021-04-17 MED ORDER — ACETAMINOPHEN 650 MG RE SUPP: 650.0000 mg | RECTAL | Status: DC | PRN

## 2021-04-17 MED ORDER — SODIUM CHLORIDE (PF) 0.9 % IJ SOLN
INTRAMUSCULAR | Status: AC
  Filled 2021-04-17: qty 10

## 2021-04-17 MED ORDER — ATENOLOL 25 MG PO TABS: 25.0000 mg | ORAL_TABLET | Freq: Every day | ORAL | Status: DC | PRN

## 2021-04-17 MED ORDER — HYDROMORPHONE HCL 1 MG/ML IJ SOLN
INTRAMUSCULAR | Status: AC
  Administered 2021-04-17: 0.5 mg via INTRAVENOUS
  Filled 2021-04-17: qty 1

## 2021-04-17 MED ORDER — SENNA 8.6 MG PO TABS
1.0000 | ORAL_TABLET | Freq: Two times a day (BID) | ORAL | Status: DC
  Administered 2021-04-17: 8.6 mg via ORAL
  Filled 2021-04-17: qty 1

## 2021-04-17 MED ORDER — MENTHOL 3 MG MT LOZG: 1.0000 | LOZENGE | OROMUCOSAL | Status: DC | PRN

## 2021-04-17 MED ORDER — PHENOL 1.4 % MT LIQD: 1.0000 | OROMUCOSAL | Status: DC | PRN

## 2021-04-17 MED ORDER — ROCURONIUM BROMIDE 10 MG/ML (PF) SYRINGE
PREFILLED_SYRINGE | INTRAVENOUS | Status: DC | PRN
  Administered 2021-04-17: 100 mg via INTRAVENOUS

## 2021-04-17 MED ORDER — CEFAZOLIN SODIUM 1 G IJ SOLR
INTRAMUSCULAR | Status: AC
  Filled 2021-04-17: qty 20

## 2021-04-17 MED ORDER — LIDOCAINE-EPINEPHRINE 1 %-1:100000 IJ SOLN
INTRAMUSCULAR | Status: AC
  Filled 2021-04-17: qty 1

## 2021-04-17 MED ORDER — PROPOFOL 10 MG/ML IV BOLUS
INTRAVENOUS | Status: DC | PRN
  Administered 2021-04-17: 120 mg via INTRAVENOUS

## 2021-04-17 MED ORDER — THROMBIN 5000 UNITS EX SOLR
CUTANEOUS | Status: AC
  Filled 2021-04-17: qty 5000

## 2021-04-17 MED ORDER — HYDROCODONE-ACETAMINOPHEN 5-325 MG PO TABS: 1.0000 | ORAL_TABLET | ORAL | 0 refills | Status: DC | PRN

## 2021-04-17 MED ORDER — HYDROCODONE-ACETAMINOPHEN 5-325 MG PO TABS
1.0000 | ORAL_TABLET | ORAL | Status: DC | PRN
  Administered 2021-04-17: 1 via ORAL
  Filled 2021-04-17: qty 1

## 2021-04-17 MED ORDER — EPHEDRINE 5 MG/ML INJ
INTRAVENOUS | Status: AC
  Filled 2021-04-17: qty 10

## 2021-04-17 MED ORDER — ACETAMINOPHEN 10 MG/ML IV SOLN
INTRAVENOUS | Status: DC | PRN
  Administered 2021-04-17: 1000 mg via INTRAVENOUS

## 2021-04-17 MED ORDER — BUPIVACAINE LIPOSOME 1.3 % IJ SUSP
INTRAMUSCULAR | Status: DC | PRN
  Administered 2021-04-17: 10 mL

## 2021-04-17 MED ORDER — LACTATED RINGERS IV SOLN: INTRAVENOUS | Status: DC

## 2021-04-17 MED ORDER — FLUTICASONE PROPIONATE 50 MCG/ACT NA SUSP
1.0000 | Freq: Every day | NASAL | Status: DC
  Filled 2021-04-17: qty 16

## 2021-04-17 MED ORDER — LIDOCAINE 2% (20 MG/ML) 5 ML SYRINGE
INTRAMUSCULAR | Status: DC | PRN
  Administered 2021-04-17: 100 mg via INTRAVENOUS

## 2021-04-17 MED ORDER — METHOCARBAMOL 1000 MG/10ML IJ SOLN
500.0000 mg | Freq: Four times a day (QID) | INTRAVENOUS | Status: DC | PRN
  Filled 2021-04-17: qty 5

## 2021-04-17 MED ORDER — MORPHINE SULFATE (PF) 2 MG/ML IV SOLN: 2.0000 mg | INTRAVENOUS | Status: DC | PRN

## 2021-04-17 MED ORDER — SODIUM CHLORIDE 0.9% FLUSH: 3.0000 mL | Freq: Two times a day (BID) | INTRAVENOUS | Status: DC

## 2021-04-17 MED ORDER — HYDROMORPHONE HCL 1 MG/ML IJ SOLN
0.2500 mg | INTRAMUSCULAR | Status: DC | PRN
  Administered 2021-04-17: 0.5 mg via INTRAVENOUS

## 2021-04-17 MED ORDER — DOCUSATE SODIUM 100 MG PO CAPS
100.0000 mg | ORAL_CAPSULE | Freq: Two times a day (BID) | ORAL | Status: DC
  Administered 2021-04-17: 100 mg via ORAL
  Filled 2021-04-17: qty 1

## 2021-04-17 MED ORDER — RISAQUAD PO CAPS
ORAL_CAPSULE | Freq: Every day | ORAL | Status: DC
  Filled 2021-04-17: qty 1

## 2021-04-17 MED ORDER — CEFAZOLIN IN SODIUM CHLORIDE 3-0.9 GM/100ML-% IV SOLN: 3.0000 g | INTRAVENOUS | Status: DC

## 2021-04-17 MED ORDER — LEVOTHYROXINE SODIUM 25 MCG PO TABS: 25.0000 ug | ORAL_TABLET | ORAL | Status: DC

## 2021-04-17 MED ORDER — ROCURONIUM BROMIDE 10 MG/ML (PF) SYRINGE
PREFILLED_SYRINGE | INTRAVENOUS | Status: AC
  Filled 2021-04-17: qty 10

## 2021-04-17 MED ORDER — ACETAMINOPHEN 325 MG PO TABS: 650.0000 mg | ORAL_TABLET | ORAL | Status: DC | PRN

## 2021-04-17 MED ORDER — BUPIVACAINE HCL (PF) 0.5 % IJ SOLN
INTRAMUSCULAR | Status: DC | PRN
  Administered 2021-04-17 (×2): 5 mL

## 2021-04-17 MED ORDER — FAMOTIDINE 20 MG PO TABS: 40.0000 mg | ORAL_TABLET | Freq: Every day | ORAL | Status: DC

## 2021-04-17 MED ORDER — CHLORHEXIDINE GLUCONATE 0.12 % MT SOLN
OROMUCOSAL | Status: AC
  Administered 2021-04-17: 15 mL via OROMUCOSAL
  Filled 2021-04-17: qty 15

## 2021-04-17 SURGICAL SUPPLY — 39 items
BAG COUNTER SPONGE SURGICOUNT (BAG) IMPLANT
BAG SURGICOUNT SPONGE COUNTING (BAG)
BAND RUBBER #18 3X1/16 STRL (MISCELLANEOUS) ×6 IMPLANT
BLADE SURG 15 STRL LF DISP TIS (BLADE) ×1 IMPLANT
BLADE SURG 15 STRL SS (BLADE) ×3
BUR PRECISION MATCH 2.5 (BURR) ×3 IMPLANT
BUR PRECISION MATCH 3.0 13 (BURR) ×2 IMPLANT
BUR PRECISION MATCH 3.0 13CM (BURR) ×1
CANISTER SUCT 3000ML PPV (MISCELLANEOUS) ×3 IMPLANT
DERMABOND ADVANCED (GAUZE/BANDAGES/DRESSINGS) ×2
DERMABOND ADVANCED .7 DNX12 (GAUZE/BANDAGES/DRESSINGS) ×1 IMPLANT
DRAPE C-ARM 42X72 X-RAY (DRAPES) ×6 IMPLANT
DRAPE LAPAROTOMY 100X72X124 (DRAPES) ×3 IMPLANT
DRAPE MICROSCOPE LEICA (MISCELLANEOUS) ×3 IMPLANT
DURAPREP 6ML APPLICATOR 50/CS (WOUND CARE) ×3 IMPLANT
ELECT BLADE 6.5 EXT (BLADE) ×3 IMPLANT
ELECT REM PT RETURN 9FT ADLT (ELECTROSURGICAL) ×3
ELECTRODE REM PT RTRN 9FT ADLT (ELECTROSURGICAL) ×1 IMPLANT
GAUZE 4X4 16PLY ~~LOC~~+RFID DBL (SPONGE) ×3 IMPLANT
GLOVE SURG LTX SZ8.5 (GLOVE) ×3 IMPLANT
GLOVE SURG UNDER POLY LF SZ8.5 (GLOVE) ×3 IMPLANT
GOWN STRL REUS W/ TWL LRG LVL3 (GOWN DISPOSABLE) IMPLANT
GOWN STRL REUS W/ TWL XL LVL3 (GOWN DISPOSABLE) ×2 IMPLANT
GOWN STRL REUS W/TWL 2XL LVL3 (GOWN DISPOSABLE) ×3 IMPLANT
GOWN STRL REUS W/TWL LRG LVL3 (GOWN DISPOSABLE)
GOWN STRL REUS W/TWL XL LVL3 (GOWN DISPOSABLE) ×6
HEMOSTAT POWDER KIT SURGIFOAM (HEMOSTASIS) ×3 IMPLANT
KIT BASIN OR (CUSTOM PROCEDURE TRAY) ×3 IMPLANT
KIT TURNOVER KIT B (KITS) ×3 IMPLANT
NEEDLE HYPO 18GX1.5 BLUNT FILL (NEEDLE) IMPLANT
NEEDLE HYPO 22GX1.5 SAFETY (NEEDLE) ×3 IMPLANT
NEEDLE SPNL 20GX3.5 QUINCKE YW (NEEDLE) ×3 IMPLANT
NS IRRIG 1000ML POUR BTL (IV SOLUTION) ×3 IMPLANT
PACK LAMINECTOMY NEURO (CUSTOM PROCEDURE TRAY) ×3 IMPLANT
SUT VIC AB 3-0 SH 8-18 (SUTURE) ×3 IMPLANT
SUT VIC AB 4-0 RB1 18 (SUTURE) ×3 IMPLANT
TOWEL GREEN STERILE (TOWEL DISPOSABLE) ×3 IMPLANT
TOWEL GREEN STERILE FF (TOWEL DISPOSABLE) ×3 IMPLANT
WATER STERILE IRR 1000ML POUR (IV SOLUTION) ×3 IMPLANT

## 2021-04-17 NOTE — Anesthesia Procedure Notes (Signed)
Procedure Name: Intubation Date/Time: 04/17/2021 7:51 AM Performed by: Claris Che, CRNA Pre-anesthesia Checklist: Patient identified, Emergency Drugs available, Suction available, Patient being monitored and Timeout performed Patient Re-evaluated:Patient Re-evaluated prior to induction Oxygen Delivery Method: Circle system utilized Preoxygenation: Pre-oxygenation with 100% oxygen Induction Type: IV induction and Rapid sequence Ventilation: Mask ventilation without difficulty Laryngoscope Size: Mac and 4 Grade View: Grade II Tube type: Oral Tube size: 7.5 mm Number of attempts: 1 Airway Equipment and Method: Stylet Placement Confirmation: ETT inserted through vocal cords under direct vision, positive ETCO2 and breath sounds checked- equal and bilateral Secured at: 22 cm Tube secured with: Tape Dental Injury: Teeth and Oropharynx as per pre-operative assessment

## 2021-04-17 NOTE — Anesthesia Postprocedure Evaluation (Signed)
Anesthesia Post Note  Patient: [name]  Procedure(s) Performed: Right Thoracic Seven-Eight  Extraforaminal decompression/foraminotomy (Right: Spine Thoracic)     Patient location during evaluation: PACU Anesthesia Type: General Level of consciousness: awake and alert Pain management: pain level controlled Vital Signs Assessment: post-procedure vital signs reviewed and stable Respiratory status: spontaneous breathing, nonlabored ventilation, respiratory function stable and patient connected to nasal cannula oxygen Cardiovascular status: blood pressure returned to baseline and stable Postop Assessment: no apparent nausea or vomiting Anesthetic complications: no   No notable events documented.  Last Vitals:  Vitals:   04/17/21 1123 04/17/21 1211  BP: 133/66 (!) 143/67  Pulse: (!) 49 (!) 55  Resp: (!) 9 12  Temp:  (!) 36.3 C  SpO2: 96% 100%    Last Pain:  Vitals:   04/17/21 1434  TempSrc:   PainSc: Asleep                 [name] S

## 2021-04-17 NOTE — H&P (Signed)
[name] is an 75 y.o. female.   Chief Complaint: Right thoracic radiculopathy  HPI: [name] is a 75 year old individual who several weeks ago underwent an acrylic balloon kyphoplasty of the T7 vertebrae.  Postoperatively she woke up with a severe right thoracic radiculopathy involving the right-sided chest wall.  CT scan demonstrated evidence of some extravasation of cement into the foramen on the right side from the T7 vertebrae this caused significant stenosis on the T7 nerve root.  Despite efforts at conservative management and the passage of time radiculopathy has continued and I have advised a decompression of the T7 foramen to remove the cement and decompress the T7 nerve root.  She is now admitted for this procedure.  Immediately before the kyphoplasty the patient had a negative COVID test.  A few days afterwards she experienced some cough and tested positive on a home kit.  In preparation for this admission she had another positive COVID test although she notes that she was sick for only 2 or 3 days at the time she took a home kit she has not febrile or ill feeling at all this time.  Patient notes that she has had COVID twice before and she is fully vaccinated. Past Medical History:  Diagnosis Date   Anemia    Arthritis    osteoarthritis   Claustrophobia    Complication of anesthesia    hard time waking up after her gallbladder surgery 28 years ago   DVT (deep venous thrombosis) (HCC)    left leg   GERD (gastroesophageal reflux disease)    hx of   Heart murmur    Dr. Tamala Julian follows, 'nothing to worry about.'   History of blood transfusion    History of hiatal hernia    small   History of kidney stones    Hypercholesteremia    under control   Hypertension    Hypothyroidism    Mild aortic stenosis    a. by echo 12/2017.   Panic attack    PONV (postoperative nausea and vomiting)    Pulmonary embolism (HCC)    bilateral lungs - 2008 after disc surgery    Past  Surgical History:  Procedure Laterality Date   ABDOMINAL HYSTERECTOMY  10/18/1973   partial   APPENDECTOMY  10/19/1971   BACK SURGERY     [id number]   CARDIAC CATHETERIZATION  ~2010   CARPAL TUNNEL RELEASE Bilateral    CHOLECYSTECTOMY  10/19/1995   COLONOSCOPY     COLONOSCOPY WITH PROPOFOL N/A 12/05/2017   Procedure: COLONOSCOPY WITH PROPOFOL;  Surgeon: Laurence Spates, MD;  Location: [facility];  Service: Endoscopy;  Laterality: N/A;   DILATION AND CURETTAGE OF UTERUS     ENDARTERECTOMY Right 08/10/2013   Procedure: Excision of Venous Aneurysm Right Neck;  Surgeon: Rosetta Posner, MD;  Location: [facility];  Service: Vascular;  Laterality: Right;   ESOPHAGOGASTRODUODENOSCOPY (EGD) WITH PROPOFOL N/A 12/05/2017   Procedure: ESOPHAGOGASTRODUODENOSCOPY (EGD) WITH PROPOFOL;  Surgeon: Laurence Spates, MD;  Location: [facility];  Service: Endoscopy;  Laterality: N/A;   filter Left 10/19/2007   IVC   FRACTURE SURGERY Left    left wrist   HIP SURGERY Left 10/18/2006   JOINT REPLACEMENT Right 11/19/2003   (knee)multiple surgeries   JOINT REPLACEMENT Left 12/16/2009   (knee)   JOINT REPLACEMENT Left 2011, 2012   Hip   KYPHOPLASTY N/A 03/03/2021   Procedure: Thoracic seven Kyphoplasty;  Surgeon: Kristeen Miss, MD;  Location: [place];  Service: Neurosurgery;  Laterality: N/A;   KYPHOPLASTY N/A 03/31/2021   Procedure: Thoracic seven Kyphoplasty;  Surgeon: Kristeen Miss, MD;  Location: [facility];  Service: Neurosurgery;  Laterality: N/A;   MASS EXCISION  10/18/1972   uterus   QUADRICEPS TENDON REPAIR Right 12/06/2016   Procedure: QUAD TENDON RECONSTRUCTION;  Surgeon: Paralee Cancel, MD;  Location: [facility];  Service: Orthopedics;  Laterality: Right;  Requests for 90 mins   RADIOLOGY WITH ANESTHESIA N/A 01/20/2021   Procedure: MRI WITH [facility] PAIN WITHOUT CONTRAST;  Surgeon: Radiologist, Medication, MD;  Location: [place];  Service: Radiology;  Laterality: N/A;   TONSILLECTOMY  10/18/1984   TOTAL HIP  REVISION Left 12/07/2013   Procedure:  REVISION  CONSTRAINED LINER LEFT TOTAL HIP ;  Surgeon: Mauri Pole, MD;  Location: [facility];  Service: Orthopedics;  Laterality: Left;   TOTAL SHOULDER REPLACEMENT Right 11/30/2010   TUBAL LIGATION  10/19/1971    Family History  Problem Relation Age of Onset   Diabetes Mother    Heart disease Mother        before age 14 - ?thick blood - pt does not know if patient had formal heart issue but heart stopped   Hyperlipidemia Mother    Hypertension Mother    Diabetes Father    Hyperlipidemia Father    Hypertension Father    Heart attack Father        in his 36s during adm for flu   Peripheral vascular disease Father    Other Father        amputation   Diabetes Sister    Hyperlipidemia Sister    Hypertension Sister    Deep vein thrombosis Brother    Hyperlipidemia Brother    Hypertension Brother    Social History:  reports that she has never smoked. She has never used smokeless tobacco. She reports that she does not drink alcohol and does not use drugs.  Allergies:  Allergies  Allergen Reactions   Celecoxib Rash    Medications Prior to Admission  Medication Sig Dispense Refill   ALPRAZolam (XANAX) 0.5 MG tablet Take 0.5 mg by mouth 2 (two) times daily.     amLODipine (NORVASC) 5 MG tablet Take 2.5 mg by mouth at bedtime.     atenolol (TENORMIN) 25 MG tablet Take 25 mg by mouth See admin instructions. Take one tablet (25 mg) by mouth every morning, may also take a 2nd tablet (25 mg) if SBP >150     Bacillus Coagulans-Inulin (PROBIOTIC-PREBIOTIC PO) Take 1 tablet by mouth in the morning.     baclofen (LIORESAL) 10 MG tablet Take 1 tablet (10 mg total) by mouth every 8 (eight) hours. 30 each 0   Calcium Carb-Cholecalciferol (CALCIUM-VITAMIN D) 600-400 MG-UNIT TABS Take 2 tablets by mouth in the morning. Gummies     escitalopram (LEXAPRO) 5 MG tablet Take 5 mg by mouth at bedtime.     esomeprazole (NEXIUM) 40 MG capsule Take 40 mg by mouth in  the morning.     famotidine (PEPCID) 40 MG tablet Take 40 mg by mouth at bedtime.  3   fluticasone (FLONASE) 50 MCG/ACT nasal spray Place 1 spray into both nostrils in the morning.     furosemide (LASIX) 20 MG tablet Take 20 mg by mouth every Monday, Tuesday, Wednesday, Thursday, and Friday. In the morning     gabapentin (NEURONTIN) 600 MG tablet Take 600 mg by mouth 3 (three) times daily.     Histamine Dihydrochloride (AUSTRALIAN DREAM ARTHRITIS) 0.025 %  CREA Apply 1 application topically 4 (four) times daily as needed (spasms/joint pain.).     HYDROcodone-acetaminophen (NORCO/VICODIN) 5-325 MG tablet Take 1 tablet by mouth every 4 (four) hours as needed for moderate pain. 60 tablet 0   levothyroxine (SYNTHROID, LEVOTHROID) 50 MCG tablet Take 25 mcg by mouth every Monday, Wednesday, and Friday.  1   losartan (COZAAR) 50 MG tablet Take 50 mg by mouth 2 (two) times daily.     Multiple Vitamins-Minerals (MULTI + OMEGA-3 ADULT GUMMIES PO) Take 2 tablets by mouth in the morning.     Rivaroxaban (XARELTO) 20 MG TABS tablet Take 20 mg by mouth daily at 4 PM.     simvastatin (ZOCOR) 20 MG tablet Take 20 mg by mouth at bedtime.     vitamin C (ASCORBIC ACID) 250 MG tablet Take 1,000 mg by mouth in the morning.     ondansetron (ZOFRAN-ODT) 4 MG disintegrating tablet Take 4 mg by mouth 2 (two) times daily as needed for nausea or vomiting.  3   Polyethyl Glycol-Propyl Glycol (SYSTANE OP) Place 1-2 drops into both eyes 2 (two) times daily as needed (for dry eyes).      Results for orders placed or performed during the hospital encounter of 04/16/21 (from the past 48 hour(s))  CBC per protocol     Status: Abnormal   Collection Time: 04/16/21  2:02 PM  Result Value Ref Range   WBC 6.6 4.0 - 10.5 K/uL   RBC 3.77 (L) 3.87 - 5.11 MIL/uL   Hemoglobin 11.1 (L) 12.0 - 15.0 g/dL   HCT 35.3 (L) 36.0 - 46.0 %   MCV 93.6 80.0 - 100.0 fL   MCH 29.4 26.0 - 34.0 pg   MCHC 31.4 30.0 - 36.0 g/dL   RDW 12.6 11.5 - 15.5  %   Platelets 229 150 - 400 K/uL   nRBC 0.0 0.0 - 0.2 %    Comment: Performed at [facility], [place] [address] [place], [place] [address]  Basic metabolic panel per protocol     Status: Abnormal   Collection Time: 04/16/21  2:02 PM  Result Value Ref Range   Sodium 136 135 - 145 mmol/L   Potassium 5.4 (H) 3.5 - 5.1 mmol/L   Chloride 101 98 - 111 mmol/L   CO2 27 22 - 32 mmol/L   Glucose, Bld 103 (H) 70 - 99 mg/dL    Comment: Glucose reference range applies only to samples taken after fasting for at least 8 hours.   BUN 25 (H) 8 - 23 mg/dL   Creatinine, Ser 1.32 (H) 0.44 - 1.00 mg/dL   Calcium 9.9 8.9 - 10.3 mg/dL   GFR, Estimated 42 (L) >60 mL/min    Comment: (NOTE) Calculated using the CKD-EPI Creatinine Equation (2021)    Anion gap 8 5 - 15    Comment: Performed at [facility] [address] [place], [place] [address]  SARS Coronavirus 2 by RT PCR (hospital order, performed in [facility] hospital lab) Nasopharyngeal Nasopharyngeal Swab     Status: Abnormal   Collection Time: 04/16/21  2:09 PM   Specimen: Nasopharyngeal Swab  Result Value Ref Range   SARS Coronavirus 2 POSITIVE (A) NEGATIVE    Comment: RESULT CALLED TO, READ BACK BY AND VERIFIED WITHEllwood Handler RN [phone] 04/16/21 A BROWNING (NOTE) SARS-CoV-2 target nucleic acids are DETECTED  SARS-CoV-2 RNA is generally detectable in upper respiratory specimens  during the acute phase of infection.  Positive  results are indicative  of the presence of the identified virus, but do not rule out bacterial infection or co-infection with other pathogens not detected by the test.  Clinical correlation with patient history and  other diagnostic information is necessary to determine patient infection status.  The expected result is negative.  Fact Sheet for Patients:   StrictlyIdeas.no   Fact Sheet for Healthcare Providers:   BankingDealers.co.za    This test is not yet  approved or cleared by the [place] FDA and  has been authorized for detection and/or diagnosis of SARS-CoV-2 by FDA under an Emergency Use Authorization (EUA).  This EUA will remain in effect (meaning this te st can be used) for the duration of  the COVID-19 declaration under Section 564(b)(1) of the Act, 21 U.S.C. section 360-bbb-3(b)(1), unless the authorization is terminated or revoked sooner.  Performed at [facility], [place] [address] [place], [place] [address]    No results found.  Review of Systems  Constitutional:  Positive for activity change.  HENT: Negative.    Eyes: Negative.   Respiratory: Negative.    Cardiovascular: Negative.   Gastrointestinal: Negative.   Endocrine: Negative.   Genitourinary: Negative.   Musculoskeletal:  Positive for back pain.  Allergic/Immunologic: Negative.   Neurological:  Positive for weakness and numbness.  Hematological: Negative.   Psychiatric/Behavioral: Negative.     Blood pressure (!) 145/57, pulse 68, temperature 98.5 F (36.9 C), temperature source Oral, resp. rate 20, height _0  (1.6 m), weight 121.5 kg, SpO2 93 %. Physical Exam Constitutional:      Appearance: Normal appearance.  HENT:     Head: Normocephalic and atraumatic.     Right Ear: Tympanic membrane normal.     Left Ear: Tympanic membrane normal.     Nose: Nose normal.     Mouth/Throat:     Mouth: Mucous membranes are moist.  Eyes:     Pupils: Pupils are equal, round, and reactive to light.  Cardiovascular:     Rate and Rhythm: Normal rate.  Abdominal:     General: Abdomen is flat. Bowel sounds are normal.     Palpations: Abdomen is soft.  Musculoskeletal:        General: Swelling and tenderness present.     Cervical back: Normal range of motion.     Right lower leg: Edema present.     Left lower leg: Edema present.  Skin:    General: Skin is warm and dry.     Capillary Refill: Capillary refill takes less than 2 seconds.  Neurological:      Mental Status: She is alert.     Comments: Patient has significant edema in both lower extremities and is generally weak in the iliopsoas quad tibialis anterior and gastrocs to 4 out of 5 absent reflexes are noted in the patellae and the Achilles.  Upper extremity strength is intact save for grip strength which is decreased to 4 out of 5 bilaterally.  Cranial nerve examination is within the limits of normal.  Psychiatric:        Mood and Affect: Mood normal.        Behavior: Behavior normal.        Thought Content: Thought content normal.        Judgment: Judgment normal.     Assessment/Plan T7-T8 radiculopathy on the right.  Plan decompression of T7 nerve root on the right via Metrix approach with microdissection technique.  Earleen Newport, MD 04/17/2021, 7:40 AM

## 2021-04-17 NOTE — Op Note (Signed)
Date of surgery: 04/17/2021 Preoperative diagnosis: Right T7 radiculopathy status post acrylic balloon kyphoplasty Postoperative diagnosis: Same Procedure: Right T7-T8 nerve root decompression using Metrix technique operating microscope Surgeon: Barnett Abu Anesthesia: General endotracheal Indications: [name] is a 75 year old individual who underwent acrylic balloon kyphoplasty of T7 vertebrae.  Postoperatively she had experienced severe right thoracic radicular pain and subsequent CT demonstrated that there was some extravasation of cement in the region of the foramen on the right side at the T7 level.  After careful consideration of her situation I advised surgical decompression of the right T7 foramen given that the pain was not responding to conservative measures and the passage of time.  Procedure: The patient was brought to the operating room supine on the stretcher.  After the smooth induction of general endotracheal anesthesia she was carefully turned prone.  The back was prepped with alcohol DuraPrep and draped in a sterile fashion.  Fluoroscopic guidance was then used in the AP and lateral projections to isolate the T7-T8 foraminal zone on the right side.  The skin was infiltrated in the area chosen for incision and a small vertical incision measuring 2 cm was created.  The area was identified then positively again by placing a K wire over the chosen zone and a series of dilators was then passed over the K wire to dilate this to the 18 mm size.  An 18 mm x 4 cm cannula was then inserted over the dilators and secured to the operating table with the snake arm.  The operating microscope was brought into the field and under magnification we cleared the soft tissues over the lamina facet complex on the right side at T7-T8.  As the dissection continued the edge of the dura came into view and then the path of the T7 nerve root was noted to be displaced inferiorly.  Dissecting cephalad to this  identified a fragment of cement that was embedded in some soft tissues and epidural veins.  Soft tissues were carefully cleaned.  The area was dissected and a singular fragment of cement was removed from the lateral aspect of the T7 vertebrae.  This freed the distal end of the T7 nerve root.  More medially a second fragment of cement was identified and this was gradually mobilized and after being broken into 2 pieces with a 1 mm Kerrison punch the fragments could be removed.  This allowed for good decompression of the lateral aspect of the dural tube and the T7 nerve root.  Hemostasis in the area was obtained with bipolar cautery some small pledgets of Gelfoam.  Then 5 cc of half percent Marcaine was allowed to bathe the path of the nerve root and the common dural tube.  The Metrix retractor was then removed and the soft tissues were inspected for hemostasis and the fascia was closed with 3-0 Vicryl interrupted fashion.  10 cc of Exparel was then injected into the fascia and the paraspinous muscles.  The subcutaneous tissues were then closed with 3-0 Vicryl and 4-0 Vicryl in the subcuticular skin.  Dermabond was placed on the skin and blood loss for the procedure was estimated at approximately 100 cc.  Patient tolerated procedure well was returned to recovery in stable condition.

## 2021-04-17 NOTE — Discharge Summary (Signed)
Physician Discharge Summary  Patient ID: ARIAL GALLIGAN MRN: [id number] DOB/AGE: 05/22/1946 75 y.o.  Admit date: 04/17/2021 Discharge date: 04/17/2021  Admission Diagnoses: Thoracic radiculopathy T7  Discharge Diagnoses: Thoracic radiculopathy T7 Active Problems:   Thoracic spine pain   Thoracic radiculopathy   Discharged Condition: fair  Hospital Course: Patient was admitted to undergo surgical decompression T7 on the right side.  She tolerated surgery well.  Consults: None  Significant Diagnostic Studies: None  Treatments: surgery: See op note  Discharge Exam: Blood pressure (!) 145/57, pulse 68, temperature 98.5 F (36.9 C), temperature source Oral, resp. rate 20, height 5\' 3"  (1.6 m), weight 121.5 kg, SpO2 93 %. Incision is clean and dry motor function is intact  Disposition: Discharge disposition: 01-Home or Self Care       Discharge Instructions     Call MD for:  redness, tenderness, or signs of infection (pain, swelling, redness, odor or green/yellow discharge around incision site)   Complete by: As directed    Call MD for:  severe uncontrolled pain   Complete by: As directed    Call MD for:  temperature >100.4   Complete by: As directed    Diet - low sodium heart healthy   Complete by: As directed    Incentive spirometry RT   Complete by: As directed    Increase activity slowly   Complete by: As directed       Allergies as of 04/17/2021       Reactions   Celecoxib Rash        Medication List     TAKE these medications    ALPRAZolam 0.5 MG tablet Commonly known as: XANAX Take 0.5 mg by mouth 2 (two) times daily.   amLODipine 5 MG tablet Commonly known as: NORVASC Take 2.5 mg by mouth at bedtime.   atenolol 25 MG tablet Commonly known as: TENORMIN Take 25 mg by mouth See admin instructions. Take one tablet (25 mg) by mouth every morning, may also take a 2nd tablet (25 mg) if SBP >150   06/18/2021 Dream Arthritis 0.025 % Crea Generic  drug: Histamine Dihydrochloride Apply 1 application topically 4 (four) times daily as needed (spasms/joint pain.).   baclofen 10 MG tablet Commonly known as: LIORESAL Take 1 tablet (10 mg total) by mouth every 8 (eight) hours.   Calcium-Vitamin D 600-400 MG-UNIT Tabs Take 2 tablets by mouth in the morning. Gummies   escitalopram 5 MG tablet Commonly known as: LEXAPRO Take 5 mg by mouth at bedtime.   esomeprazole 40 MG capsule Commonly known as: NEXIUM Take 40 mg by mouth in the morning.   famotidine 40 MG tablet Commonly known as: PEPCID Take 40 mg by mouth at bedtime.   fluticasone 50 MCG/ACT nasal spray Commonly known as: FLONASE Place 1 spray into both nostrils in the morning.   furosemide 20 MG tablet Commonly known as: LASIX Take 20 mg by mouth every Monday, Tuesday, Wednesday, Thursday, and Friday. In the morning   gabapentin 600 MG tablet Commonly known as: NEURONTIN Take 600 mg by mouth 3 (three) times daily.   HYDROcodone-acetaminophen 5-325 MG tablet Commonly known as: NORCO/VICODIN Take 1-2 tablets by mouth every 4 (four) hours as needed for moderate pain. What changed: how much to take   levothyroxine 50 MCG tablet Commonly known as: SYNTHROID Take 25 mcg by mouth every Monday, Wednesday, and Friday.   losartan 50 MG tablet Commonly known as: COZAAR Take 50 mg by mouth 2 (two) times daily.  MULTI + OMEGA-3 ADULT GUMMIES PO Take 2 tablets by mouth in the morning.   ondansetron 4 MG disintegrating tablet Commonly known as: ZOFRAN-ODT Take 4 mg by mouth 2 (two) times daily as needed for nausea or vomiting.   PROBIOTIC-PREBIOTIC PO Take 1 tablet by mouth in the morning.   rivaroxaban 20 MG Tabs tablet Commonly known as: XARELTO Take 20 mg by mouth daily at 4 PM.   simvastatin 20 MG tablet Commonly known as: ZOCOR Take 20 mg by mouth at bedtime.   SYSTANE OP Place 1-2 drops into both eyes 2 (two) times daily as needed (for dry eyes).    vitamin C 250 MG tablet Commonly known as: ASCORBIC ACID Take 1,000 mg by mouth in the morning.         Signed: Shary Key Aspen Deterding 04/17/2021, 10:12 AM

## 2021-04-17 NOTE — Evaluation (Signed)
Physical Therapy Evaluation Patient Details Name: [name] MRN: [id number] DOB: 10/24/1945 Today's Date: 04/17/2021   History of Present Illness  75 y/o female recently s/p T7 kyphoplasty on 6/13. Now s/p T7 decompression on 7/1 after extravasation of cement into the foramen on R side from T7 vertebrae causing stenosis on T7 nerve root. Tested positive for COVID in mid June, on precautions. PMH: T11 kyphoplasty, DVT, PE.  Clinical Impression  PTA, patient lives with husband and reports using cane for mobility. Patient currently presents with generalized weakness, decreased activity tolerance, impaired balance. Patient requires min guard for mobility with SPC. Educated patient on back precautions, progressive walking program, and benefits of exercise. Patient will benefit from skilled PT services during acute stay to address listed deficits. No PT follow up recommended at this time.      Follow Up Recommendations No PT follow up;Supervision for mobility/OOB    Equipment Recommendations  None recommended by PT    Recommendations for Other Services       Precautions / Restrictions Precautions Precautions: Fall;Back Precaution Booklet Issued: No Precaution Comments: Back precautions for comfort Restrictions Weight Bearing Restrictions: No      Mobility  Bed Mobility Overal bed mobility: Needs Assistance Bed Mobility: Supine to Sit;Sit to Supine     Supine to sit: Supervision Sit to supine: Min assist   General bed mobility comments: minA for LE management back into bed    Transfers Overall transfer level: Needs assistance Equipment used: Straight cane Transfers: Sit to/from Stand Sit to Stand: Min guard         General transfer comment: min guard for safety. LOB x 1 after rising landing on buttocks on bed  Ambulation/Gait Ambulation/Gait assistance: Min guard Gait Distance (Feet): 40 Feet Assistive device: Straight cane Gait Pattern/deviations: Step-through  pattern;Antalgic;Decreased stride length;Trunk flexed Gait velocity: decreased Gait velocity interpretation: <1.8 ft/sec, indicate of risk for recurrent falls General Gait Details: verbal cues for upright posture, scapular retraction, and forward gaze. Slow steady gait, no assistance required  Stairs            Wheelchair Mobility    Modified Rankin (Stroke Patients Only)       Balance Overall balance assessment: Mild deficits observed, not formally tested                                           Pertinent Vitals/Pain Pain Assessment: Faces Faces Pain Scale: Hurts a little bit Pain Location: mild back pain Pain Descriptors / Indicators: Grimacing Pain Intervention(s): Monitored during session;Limited activity within patient's tolerance    Home Living Family/patient expects to be discharged to:: Private residence Living Arrangements: Spouse/significant other Available Help at Discharge: Family Type of Home: House Home Access: Stairs to enter Entrance Stairs-Rails: Left Entrance Stairs-Number of Steps: 2 Home Layout: One level Home Equipment: Environmental consultant - 2 wheels;Cane - single point;Grab bars - tub/shower;Shower seat Additional Comments: pt reports they have a built in bench in her shower, it is too high, she has a shower seat she can use instead. Husband can assist physically if needed. Son and daughter in law are next door and assit with IADLs    Prior Function Level of Independence: Needs assistance   Gait / Transfers Assistance Needed: uses cane  ADL's / Homemaking Assistance Needed: able to assist husband with IADL's i.e. cooking, adapts and sits and folds laundry  Comments:  Per pt report she has fallen twice since january; both outside of the home.     Hand Dominance        Extremity/Trunk Assessment   Upper Extremity Assessment Upper Extremity Assessment: Generalized weakness    Lower Extremity Assessment Lower Extremity Assessment:  Generalized weakness    Cervical / Trunk Assessment Cervical / Trunk Assessment: Other exceptions;Kyphotic Cervical / Trunk Exceptions: increased body habitus  Communication   Communication: No difficulties  Cognition Arousal/Alertness: Awake/alert Behavior During Therapy: WFL for tasks assessed/performed Overall Cognitive Status: Within Functional Limits for tasks assessed                                        General Comments      Exercises     Assessment/Plan    PT Assessment Patient needs continued PT services  PT Problem List Decreased strength;Decreased activity tolerance;Decreased balance;Decreased mobility;Pain       PT Treatment Interventions DME instruction;Gait training;Stair training;Functional mobility training;Therapeutic activities;Balance training;Therapeutic exercise;Patient/family education    PT Goals (Current goals can be found in the Care Plan section)  Acute Rehab PT Goals Patient Stated Goal: Be able to walk without pain. PT Goal Formulation: With patient Time For Goal Achievement: 05/01/21 Potential to Achieve Goals: Good    Frequency Min 5X/week   Barriers to discharge        Co-evaluation               AM-PAC PT "6 Clicks" Mobility  Outcome Measure Help needed turning from your back to your side while in a flat bed without using bedrails?: None Help needed moving from lying on your back to sitting on the side of a flat bed without using bedrails?: A Little Help needed moving to and from a bed to a chair (including a wheelchair)?: A Little Help needed standing up from a chair using your arms (e.g., wheelchair or bedside chair)?: A Little Help needed to walk in hospital room?: A Little Help needed climbing 3-5 steps with a railing? : A Little 6 Click Score: 19    End of Session Equipment Utilized During Treatment: Gait belt Activity Tolerance: Patient tolerated treatment well Patient left: in bed;with call  bell/phone within reach Nurse Communication: Mobility status PT Visit Diagnosis: Unsteadiness on feet (R26.81);Pain;Difficulty in walking, not elsewhere classified (R26.2) Pain - Right/Left: Right Pain - part of body:  (mid back)    Time: 1505-6979 PT Time Calculation (min) (ACUTE ONLY): 24 min   Charges:   PT Evaluation $PT Eval Low Complexity: 1 Low          Romano Stigger A. Dan Humphreys PT, DPT Acute Rehabilitation Services Pager [phone] Office [phone]   Viviann Spare 04/17/2021, 3:44 PM

## 2021-04-17 NOTE — Transfer of Care (Signed)
Immediate Anesthesia Transfer of Care Note  Patient: [name]  Procedure(s) Performed: Right Thoracic Seven-Eight  Extraforaminal decompression/foraminotomy (Right: Spine Thoracic)  Patient Location: PACU  Anesthesia Type:General  Level of Consciousness: awake, alert , oriented and patient cooperative  Airway & Oxygen Therapy: Patient Spontanous Breathing and Patient connected to nasal cannula oxygen  Post-op Assessment: Report given to RN, Post -op Vital signs reviewed and stable and Patient moving all extremities X 4  Post vital signs: Reviewed and stable  Last Vitals:  Vitals Value Taken Time  BP 148/69 04/17/21 0953  Temp    Pulse 63 04/17/21 1005  Resp 22 04/17/21 1005  SpO2 98 % 04/17/21 1005  Vitals shown include unvalidated device data.  Last Pain:  Vitals:   04/17/21 0622  TempSrc:   PainSc: 5          Complications: No notable events documented.

## 2021-04-18 ENCOUNTER — Encounter (HOSPITAL_COMMUNITY): Payer: Self-pay | Admitting: Neurological Surgery

## 2021-05-08 ENCOUNTER — Other Ambulatory Visit: Payer: Self-pay | Admitting: Neurological Surgery

## 2021-05-08 ENCOUNTER — Ambulatory Visit
Admission: RE | Admit: 2021-05-08 | Discharge: 2021-05-08 | Disposition: A | Discharge status: Home / Self Care | Payer: PPO | Source: Ambulatory Visit | Attending: Neurological Surgery | Admitting: Neurological Surgery

## 2021-05-08 DIAGNOSIS — M546 Pain in thoracic spine: Secondary | ICD-10-CM

## 2021-05-08 DIAGNOSIS — S22060A Wedge compression fracture of T7-T8 vertebra, initial encounter for closed fracture: Secondary | ICD-10-CM | POA: Diagnosis not present

## 2021-05-08 DIAGNOSIS — M4326 Fusion of spine, lumbar region: Secondary | ICD-10-CM | POA: Diagnosis not present

## 2021-05-08 DIAGNOSIS — M4312 Spondylolisthesis, cervical region: Secondary | ICD-10-CM | POA: Diagnosis not present

## 2021-05-08 DIAGNOSIS — I2510 Atherosclerotic heart disease of native coronary artery without angina pectoris: Secondary | ICD-10-CM | POA: Diagnosis not present

## 2021-05-24 ENCOUNTER — Emergency Department (HOSPITAL_COMMUNITY)
Admission: EM | Admit: 2021-05-24 | Discharge: 2021-05-24 | Disposition: A | Discharge status: Home / Self Care | Payer: PPO | Attending: Emergency Medicine | Admitting: Emergency Medicine

## 2021-05-24 ENCOUNTER — Other Ambulatory Visit: Payer: Self-pay

## 2021-05-24 ENCOUNTER — Encounter (HOSPITAL_COMMUNITY): Payer: Self-pay | Admitting: Emergency Medicine

## 2021-05-24 ENCOUNTER — Emergency Department (HOSPITAL_COMMUNITY): Payer: PPO

## 2021-05-24 DIAGNOSIS — Z96611 Presence of right artificial shoulder joint: Secondary | ICD-10-CM | POA: Insufficient documentation

## 2021-05-24 DIAGNOSIS — I2510 Atherosclerotic heart disease of native coronary artery without angina pectoris: Secondary | ICD-10-CM | POA: Diagnosis not present

## 2021-05-24 DIAGNOSIS — R001 Bradycardia, unspecified: Secondary | ICD-10-CM | POA: Diagnosis not present

## 2021-05-24 DIAGNOSIS — Z96653 Presence of artificial knee joint, bilateral: Secondary | ICD-10-CM | POA: Diagnosis not present

## 2021-05-24 DIAGNOSIS — Y92002 Bathroom of unspecified non-institutional (private) residence single-family (private) house as the place of occurrence of the external cause: Secondary | ICD-10-CM | POA: Insufficient documentation

## 2021-05-24 DIAGNOSIS — W19XXXA Unspecified fall, initial encounter: Secondary | ICD-10-CM

## 2021-05-24 DIAGNOSIS — Z79899 Other long term (current) drug therapy: Secondary | ICD-10-CM | POA: Diagnosis not present

## 2021-05-24 DIAGNOSIS — E039 Hypothyroidism, unspecified: Secondary | ICD-10-CM | POA: Insufficient documentation

## 2021-05-24 DIAGNOSIS — Z981 Arthrodesis status: Secondary | ICD-10-CM | POA: Diagnosis not present

## 2021-05-24 DIAGNOSIS — M542 Cervicalgia: Secondary | ICD-10-CM | POA: Diagnosis not present

## 2021-05-24 DIAGNOSIS — R531 Weakness: Secondary | ICD-10-CM

## 2021-05-24 DIAGNOSIS — I6529 Occlusion and stenosis of unspecified carotid artery: Secondary | ICD-10-CM | POA: Diagnosis not present

## 2021-05-24 DIAGNOSIS — I129 Hypertensive chronic kidney disease with stage 1 through stage 4 chronic kidney disease, or unspecified chronic kidney disease: Secondary | ICD-10-CM | POA: Diagnosis not present

## 2021-05-24 DIAGNOSIS — S0990XA Unspecified injury of head, initial encounter: Secondary | ICD-10-CM

## 2021-05-24 DIAGNOSIS — M79603 Pain in arm, unspecified: Secondary | ICD-10-CM | POA: Diagnosis not present

## 2021-05-24 DIAGNOSIS — N182 Chronic kidney disease, stage 2 (mild): Secondary | ICD-10-CM | POA: Insufficient documentation

## 2021-05-24 DIAGNOSIS — G319 Degenerative disease of nervous system, unspecified: Secondary | ICD-10-CM | POA: Diagnosis not present

## 2021-05-24 DIAGNOSIS — W1830XA Fall on same level, unspecified, initial encounter: Secondary | ICD-10-CM | POA: Insufficient documentation

## 2021-05-24 DIAGNOSIS — M40202 Unspecified kyphosis, cervical region: Secondary | ICD-10-CM | POA: Diagnosis not present

## 2021-05-24 DIAGNOSIS — M47812 Spondylosis without myelopathy or radiculopathy, cervical region: Secondary | ICD-10-CM | POA: Diagnosis not present

## 2021-05-24 DIAGNOSIS — M6281 Muscle weakness (generalized): Secondary | ICD-10-CM | POA: Diagnosis not present

## 2021-05-24 DIAGNOSIS — S4991XA Unspecified injury of right shoulder and upper arm, initial encounter: Secondary | ICD-10-CM | POA: Diagnosis not present

## 2021-05-24 DIAGNOSIS — R29810 Facial weakness: Secondary | ICD-10-CM | POA: Diagnosis not present

## 2021-05-24 LAB — CBG MONITORING, ED: Glucose-Capillary: 109 mg/dL — ABNORMAL HIGH (ref 70–99)

## 2021-05-24 MED ORDER — HYDROCODONE-ACETAMINOPHEN 5-325 MG PO TABS
1.0000 | ORAL_TABLET | Freq: Once | ORAL | Status: AC
Start: 2021-05-24 — End: 2021-05-24
  Administered 2021-05-24: 1 via ORAL
  Filled 2021-05-24: qty 1

## 2021-05-24 NOTE — Discharge Instructions (Addendum)
Continue pain medications that she have at home.  Keep your appointment with your neurosurgeon as scheduled for this Wednesday.  Return for any new or worse symptoms.  Be careful with due to the knee pain and weakness in your leg.

## 2021-05-24 NOTE — ED Notes (Signed)
Patient transported to CT 

## 2021-05-24 NOTE — ED Triage Notes (Signed)
Pt arrives via EMS from home with multiple times today. Pt had witnessed fall by EMS in bathroom with pain to back of head and right elbow. Pt recently had back surgery and states she has had intermittent muscle tone loss since then.

## 2021-05-24 NOTE — ED Notes (Signed)
Assisted pt to restroom and lobby. Pt stated she wanted to add Mardelle Matte as her caregiver.

## 2021-05-24 NOTE — ED Notes (Signed)
Pt stated she has had multiple surgeries. She had her knees replaced in 2005, right shoulder 2008, and multiple back surgeries. Pt stated doctor prescribed her 12 day regimen for prednisone. She finished her last dose  05/19/2021. Since then she has been having intermittent weakness that has gradually increased. She can be holding a cup and it falls. Or she can be standing and her legs gives out. Pt was using the bathroom at home when her legs gave out and she hit her head. Upon examining of her head no present injuries.

## 2021-05-24 NOTE — ED Notes (Signed)
Returned pt to the bedside in wheelchair.

## 2021-05-24 NOTE — ED Provider Notes (Signed)
[facility] EMERGENCY DEPARTMENT Provider Note   CSN: [id number] Arrival date & time: 05/24/21  1608     History Chief Complaint  Patient presents with   [name] is a 75 y.o. female.  Patient followed by Dr. Ellene Route from neurosurgery.  Patient has had a few procedures done by him for a T7 thoracic radiculopathy and fracture.  Most recently on July 1.  Patient has unsteady legs longstanding.  Due to some knee replacements.  Patient has had multiple falls.  Fall today in the bathroom with pain to the back of the head originally complained to EMS of pain to the right elbow but when she arrived here right elbow seem to be fine.  Patient did describe some upper extremity weakness bilaterally.  That may have contributed to the fall.  But that has resolved.  There is no numbness.  Patient is just completed a course of prednisone to help with her back pain.  Patient also has a history of neck pain and has had trouble with her cervical spine in the past.  She does not think that that helped very much.  He does not like taking the prednisone.  She has follow-up with Dr. Ellene Route already scheduled for this week on Wednesday.  Patient also states that she has had some of this upper extremity bilateral weakness ongoing for the past week.      Past Medical History:  Diagnosis Date   Anemia    Arthritis    osteoarthritis   Claustrophobia    Complication of anesthesia    hard time waking up after her gallbladder surgery 28 years ago   DVT (deep venous thrombosis) (HCC)    left leg   GERD (gastroesophageal reflux disease)    hx of   Heart murmur    Dr. Tamala Julian follows, 'nothing to worry about.'   History of blood transfusion    History of hiatal hernia    small   History of kidney stones    Hypercholesteremia    under control   Hypertension    Hypothyroidism    Mild aortic stenosis    a. by echo 12/2017.   Panic attack    PONV (postoperative nausea and  vomiting)    Pulmonary embolism (HCC)    bilateral lungs - 2008 after disc surgery    Patient Active Problem List   Diagnosis Date Noted   Thoracic spine pain 04/17/2021   Thoracic radiculopathy 04/17/2021   Compression fracture of body of thoracic vertebra ([place]) 03/03/2021   Coronary artery disease involving native coronary artery 12/12/2018   Chest pain 08/18/2018   Essential hypertension 08/18/2018   Rupture of quadriceps tendon 12/06/2016   Chronic kidney disease, stage II (mild) 07/27/2016   Hiatal hernia 07/27/2016   Hypothyroidism 07/27/2016   Hyperlipidemia 07/27/2016   History of DVT (deep vein thrombosis) 07/27/2016   Recurrent pulmonary emboli ([place]) 07/27/2016   Chronic anticoagulation 07/27/2016   Chronic lower back pain 07/27/2016   Arthrosis of left acromioclavicular joint 03/31/2015   Biceps tendonitis on left 03/31/2015   Chronic right shoulder pain 03/31/2015   Left shoulder pain 03/31/2015   S/P left hip revision 12/07/2013   Visit for suture removal 09/25/2013   Aneurysm of unspecified site [facility]) 08/28/2013    Past Surgical History:  Procedure Laterality Date   ABDOMINAL HYSTERECTOMY  10/18/1973   partial   APPENDECTOMY  10/19/1971   BACK SURGERY     X14 1987-2009  CARDIAC CATHETERIZATION  ~2010   CARPAL TUNNEL RELEASE Bilateral    CHOLECYSTECTOMY  10/19/1995   COLONOSCOPY     COLONOSCOPY WITH PROPOFOL N/A 12/05/2017   Procedure: COLONOSCOPY WITH PROPOFOL;  Surgeon: Laurence Spates, MD;  Location: [facility];  Service: Endoscopy;  Laterality: N/A;   DILATION AND CURETTAGE OF UTERUS     ENDARTERECTOMY Right 08/10/2013   Procedure: Excision of Venous Aneurysm Right Neck;  Surgeon: Rosetta Posner, MD;  Location: [place];  Service: Vascular;  Laterality: Right;   ESOPHAGOGASTRODUODENOSCOPY (EGD) WITH PROPOFOL N/A 12/05/2017   Procedure: ESOPHAGOGASTRODUODENOSCOPY (EGD) WITH PROPOFOL;  Surgeon: Laurence Spates, MD;  Location: [facility];  Service:  Endoscopy;  Laterality: N/A;   filter Left 10/19/2007   IVC   FRACTURE SURGERY Left    left wrist   HIP SURGERY Left 10/18/2006   JOINT REPLACEMENT Right 11/19/2003   (knee)multiple surgeries   JOINT REPLACEMENT Left 12/16/2009   (knee)   JOINT REPLACEMENT Left 2011, 2012   Hip   KYPHOPLASTY N/A 03/03/2021   Procedure: Thoracic seven Kyphoplasty;  Surgeon: Kristeen Miss, MD;  Location: [place];  Service: Neurosurgery;  Laterality: N/A;   KYPHOPLASTY N/A 03/31/2021   Procedure: Thoracic seven Kyphoplasty;  Surgeon: Kristeen Miss, MD;  Location: [place];  Service: Neurosurgery;  Laterality: N/A;   LUMBAR LAMINECTOMY/ DECOMPRESSION WITH MET-RX Right 04/17/2021   Procedure: Right Thoracic Seven-Eight  Extraforaminal decompression/foraminotomy;  Surgeon: Kristeen Miss, MD;  Location: [place];  Service: Neurosurgery;  Laterality: Right;   MASS EXCISION  10/18/1972   uterus   QUADRICEPS TENDON REPAIR Right 12/06/2016   Procedure: QUAD TENDON RECONSTRUCTION;  Surgeon: Paralee Cancel, MD;  Location: [facility];  Service: Orthopedics;  Laterality: Right;  Requests for 90 mins   RADIOLOGY WITH ANESTHESIA N/A 01/20/2021   Procedure: MRI WITH [facility] PAIN WITHOUT CONTRAST;  Surgeon: Radiologist, Medication, MD;  Location: [place];  Service: Radiology;  Laterality: N/A;   TONSILLECTOMY  10/18/1984   TOTAL HIP REVISION Left 12/07/2013   Procedure:  REVISION  CONSTRAINED LINER LEFT TOTAL HIP ;  Surgeon: Mauri Pole, MD;  Location: [facility];  Service: Orthopedics;  Laterality: Left;   TOTAL SHOULDER REPLACEMENT Right 11/30/2010   TUBAL LIGATION  10/19/1971     OB History   No obstetric history on file.     Family History  Problem Relation Age of Onset   Diabetes Mother    Heart disease Mother        before age 47 - ?thick blood - pt does not know if patient had formal heart issue but heart stopped   Hyperlipidemia Mother    Hypertension Mother    Diabetes Father    Hyperlipidemia Father    Hypertension  Father    Heart attack Father        in his 77s during adm for flu   Peripheral vascular disease Father    Other Father        amputation   Diabetes Sister    Hyperlipidemia Sister    Hypertension Sister    Deep vein thrombosis Brother    Hyperlipidemia Brother    Hypertension Brother     Social History   Tobacco Use   Smoking status: Never   Smokeless tobacco: Never  Vaping Use   Vaping Use: Never used  Substance Use Topics   Alcohol use: No   Drug use: No    Home Medications Prior to Admission medications   Medication Sig Start Date End Date  Taking? Authorizing Provider  ALPRAZolam Duanne Moron) 0.5 MG tablet Take 0.5 mg by mouth 2 (two) times daily. 07/17/13   [provider]  amLODipine (NORVASC) 5 MG tablet Take 2.5 mg by mouth at bedtime. 01/24/20   [provider]  atenolol (TENORMIN) 25 MG tablet Take 25 mg by mouth See admin instructions. Take one tablet (25 mg) by mouth every morning, may also take a 2nd tablet (25 mg) if SBP >150 06/11/13   [provider]  Bacillus Coagulans-Inulin (PROBIOTIC-PREBIOTIC PO) Take 1 tablet by mouth in the morning.    [provider]  baclofen (LIORESAL) 10 MG tablet Take 1 tablet (10 mg total) by mouth every 8 (eight) hours. 12/07/13   Danae Orleans, PA-C  Calcium Carb-Cholecalciferol (CALCIUM-VITAMIN D) 600-400 MG-UNIT TABS Take 2 tablets by mouth in the morning. Gummies    [provider]  escitalopram (LEXAPRO) 5 MG tablet Take 5 mg by mouth at bedtime. 01/22/20   [provider]  esomeprazole (NEXIUM) 40 MG capsule Take 40 mg by mouth in the morning. 04/13/21   [provider]  famotidine (PEPCID) 40 MG tablet Take 40 mg by mouth at bedtime. 08/22/18   [provider]  fluticasone (FLONASE) 50 MCG/ACT nasal spray Place 1 spray into both nostrils in the morning. 04/03/21   [provider]  furosemide (LASIX) 20 MG tablet Take 20 mg by mouth every Monday, Tuesday,  Wednesday, Thursday, and Friday. In the morning 05/28/13   [provider]  gabapentin (NEURONTIN) 600 MG tablet Take 600 mg by mouth 3 (three) times daily. 05/25/13   [provider]  Histamine Dihydrochloride (AUSTRALIAN DREAM ARTHRITIS) 0.025 % CREA Apply 1 application topically 4 (four) times daily as needed (spasms/joint pain.).    [provider]  HYDROcodone-acetaminophen (NORCO) 10-325 MG tablet Take 1 tablet by mouth every 6 (six) hours as needed.    [provider]  HYDROcodone-acetaminophen (NORCO/VICODIN) 5-325 MG tablet Take 1-2 tablets by mouth every 4 (four) hours as needed for moderate pain. 04/17/21   Kristeen Miss, MD  levothyroxine (SYNTHROID, LEVOTHROID) 50 MCG tablet Take 25 mcg by mouth every Monday, Wednesday, and Friday. 08/09/18   [provider]  losartan (COZAAR) 50 MG tablet Take 50 mg by mouth 2 (two) times daily. 12/13/19   [provider]  Multiple Vitamins-Minerals (MULTI + OMEGA-3 ADULT GUMMIES PO) Take 2 tablets by mouth in the morning.    [provider]  ondansetron (ZOFRAN-ODT) 4 MG disintegrating tablet Take 4 mg by mouth 2 (two) times daily as needed for nausea or vomiting. 06/07/18   [provider]  Polyethyl Glycol-Propyl Glycol (SYSTANE OP) Place 1-2 drops into both eyes 2 (two) times daily as needed (for dry eyes).    [provider]  Rivaroxaban (XARELTO) 20 MG TABS tablet Take 20 mg by mouth daily at 4 PM. 08/11/13   Early, Arvilla Meres, MD  simvastatin (ZOCOR) 20 MG tablet Take 20 mg by mouth at bedtime. 05/28/13   [provider]  vitamin C (ASCORBIC ACID) 250 MG tablet Take 1,000 mg by mouth in the morning.    [provider]    Allergies    Celecoxib  Review of Systems   Review of Systems  Constitutional:  Negative for chills and fever.  HENT:  Negative for ear pain and sore throat.   Eyes:  Negative for pain and visual disturbance.  Respiratory:  Negative for  cough and shortness of breath.   Cardiovascular:  Negative for chest pain and palpitations.  Gastrointestinal:  Negative for abdominal pain and vomiting.  Genitourinary:  Negative for dysuria and hematuria.  Musculoskeletal:  Positive for back pain and neck pain. Negative for arthralgias.  Skin:  Negative for color change and rash.  Neurological:  Positive for weakness. Negative for dizziness, seizures, syncope and numbness.  All other systems reviewed and are negative.  Physical Exam Updated Vital Signs BP 140/64 (BP Location: Right Arm)   Pulse 60   Temp 99 F (37.2 C) (Oral)   Resp 18   Ht 1.6 m ('5\' 3"' )   Wt 122 kg   LMP  (LMP Unknown)   SpO2 96%   BMI 47.64 kg/m   Physical Exam Vitals and nursing note reviewed.  Constitutional:      General: She is not in acute distress.    Appearance: Normal appearance. She is well-developed.  HENT:     Head: Normocephalic and atraumatic.  Eyes:     Extraocular Movements: Extraocular movements intact.     Conjunctiva/sclera: Conjunctivae normal.     Pupils: Pupils are equal, round, and reactive to light.  Neck:     Comments: No tenderness to posterior cervical spine area. Cardiovascular:     Rate and Rhythm: Normal rate and regular rhythm.     Heart sounds: No murmur heard. Pulmonary:     Effort: Pulmonary effort is normal. No respiratory distress.     Breath sounds: Normal breath sounds.  Abdominal:     Palpations: Abdomen is soft.     Tenderness: There is no abdominal tenderness.  Musculoskeletal:        General: No deformity.     Cervical back: Normal range of motion and neck supple. Tenderness present.  Skin:    General: Skin is warm and dry.  Neurological:     General: No focal deficit present.     Mental Status: She is alert and oriented to person, place, and time.     Cranial Nerves: No cranial nerve deficit.     Sensory: No sensory deficit.     Motor: Weakness present.     Comments: Weakness to the right lower  extremity.  But patient states that is baseline.  And mostly due to her knee.  Good strength in the left lower extremity.  Both upper extremities with good strength currently.    ED Results / Procedures / Treatments   Labs (all labs ordered are listed, but only abnormal results are displayed) Labs Reviewed  CBG MONITORING, ED - Abnormal; Notable for the following components:      Result Value   Glucose-Capillary 109 (*)    All other components within normal limits  CBC WITH DIFFERENTIAL/PLATELET  COMPREHENSIVE METABOLIC PANEL    EKG None ED ECG REPORT   Date: 05/24/2021  Rate: 54  Rhythm: sinus bradycardia  QRS Axis: normal  Intervals: normal  ST/T Wave abnormalities: normal  Conduction Disutrbances:none  Narrative Interpretation:   Old EKG Reviewed: none available  I have personally reviewed the EKG tracing and agree with the computerized printout as noted.   Radiology No results found.  Procedures Procedures   Medications Ordered in ED Medications - No data to display  ED Course  I have reviewed the triage vital signs and the nursing notes.  Pertinent labs & imaging results that were available during my care of the patient were reviewed by me and considered in my medical decision making (see chart for details).    MDM Rules/Calculators/A&P  Work-up here CT head CT cervical spine without any acute injuries.  Initially had some concerns regarding the upper extremity weakness but that has been present for about a week.  But currently is much improved according to the patient.  Do not feel that patient needs MRI of the cervical spine.  Patient has pre-existing weakness to the right lower extremity.  Mostly due to her knee joint.  Patient has follow-up with Dr. Ellene Route her neurosurgeon on Wednesday.  Patient did not want another course of prednisone.  We originally had ordered labs.  But they clotted.  But it is not mandatory that we have to have  those results.  Patient's blood sugar was 109.  Patient very alert.  Very cooperative.  Patient is okay with going home.  Final Clinical Impression(s) / ED Diagnoses Final diagnoses:  None    Rx / DC Orders ED Discharge Orders     None        Fredia Sorrow, MD 05/24/21 2254

## 2021-05-24 NOTE — ED Notes (Signed)
Assisted pt in wheelchair to the restroom.

## 2021-05-24 NOTE — ED Notes (Signed)
Lab called stating blood work clotted. Phlebotomy notified to redo.

## 2021-05-25 DIAGNOSIS — G8250 Quadriplegia, unspecified: Secondary | ICD-10-CM | POA: Diagnosis not present

## 2021-06-04 ENCOUNTER — Other Ambulatory Visit: Payer: Self-pay | Admitting: Neurological Surgery

## 2021-06-04 ENCOUNTER — Other Ambulatory Visit (HOSPITAL_COMMUNITY): Payer: Self-pay | Admitting: Neurological Surgery

## 2021-06-04 DIAGNOSIS — G8250 Quadriplegia, unspecified: Secondary | ICD-10-CM

## 2021-06-17 ENCOUNTER — Ambulatory Visit (HOSPITAL_COMMUNITY)
Admission: RE | Admit: 2021-06-17 | Discharge: 2021-06-17 | Disposition: A | Discharge status: Home / Self Care | Payer: PPO | Source: Ambulatory Visit | Attending: Neurological Surgery | Admitting: Neurological Surgery

## 2021-06-17 ENCOUNTER — Other Ambulatory Visit: Payer: Self-pay

## 2021-06-17 DIAGNOSIS — G8250 Quadriplegia, unspecified: Secondary | ICD-10-CM

## 2021-06-17 DIAGNOSIS — R531 Weakness: Secondary | ICD-10-CM | POA: Diagnosis not present

## 2021-06-17 DIAGNOSIS — M5021 Other cervical disc displacement,  high cervical region: Secondary | ICD-10-CM | POA: Diagnosis not present

## 2021-06-17 DIAGNOSIS — Z981 Arthrodesis status: Secondary | ICD-10-CM | POA: Diagnosis not present

## 2021-06-17 DIAGNOSIS — M47812 Spondylosis without myelopathy or radiculopathy, cervical region: Secondary | ICD-10-CM | POA: Diagnosis not present

## 2021-06-17 DIAGNOSIS — M4322 Fusion of spine, cervical region: Secondary | ICD-10-CM | POA: Diagnosis not present

## 2021-06-17 DIAGNOSIS — M4326 Fusion of spine, lumbar region: Secondary | ICD-10-CM | POA: Diagnosis not present

## 2021-06-17 DIAGNOSIS — M2578 Osteophyte, vertebrae: Secondary | ICD-10-CM | POA: Diagnosis not present

## 2021-06-17 MED ORDER — DIAZEPAM 5 MG PO TABS: 10.0000 mg | ORAL_TABLET | Freq: Once | ORAL | Status: AC

## 2021-06-17 MED ORDER — LIDOCAINE HCL (PF) 1 % IJ SOLN
5.0000 mL | Freq: Once | INTRAMUSCULAR | Status: AC
  Administered 2021-06-17: 5 mL via INTRADERMAL

## 2021-06-17 MED ORDER — ONDANSETRON HCL 4 MG/2ML IJ SOLN: 4.0000 mg | Freq: Four times a day (QID) | INTRAMUSCULAR | Status: DC | PRN

## 2021-06-17 MED ORDER — HYDROCODONE-ACETAMINOPHEN 5-325 MG PO TABS
1.0000 | ORAL_TABLET | ORAL | Status: DC | PRN
  Administered 2021-06-17: 2 via ORAL
  Filled 2021-06-17: qty 2

## 2021-06-17 MED ORDER — DIAZEPAM 5 MG PO TABS
ORAL_TABLET | ORAL | Status: AC
  Administered 2021-06-17: 10 mg via ORAL
  Filled 2021-06-17: qty 2

## 2021-06-17 MED ORDER — IOHEXOL 300 MG/ML  SOLN
10.0000 mL | Freq: Once | INTRAMUSCULAR | Status: AC | PRN
  Administered 2021-06-17: 10 mL via INTRATHECAL

## 2021-06-17 NOTE — Procedures (Signed)
Date of procedure: 06/17/2021 Preoperative diagnosis: Quadriparesis, secondary to diffuse spondylitic disease Postoperative diagnosis: Same Procedure: Total myelogram Surgeon: Danielle Dess Indications: [name] is a 75 year old individual whose had extensive cervical spondylitic disease.  She has undergone previous decompression and fusion in the lumbar spine and then had spondylitic disease across the thoracolumbar junction.  She sustained T7 and T8 11 compression fractures and was treated with kyphoplasty.  She had some extravasation of cement in the T7 region which required local decompression.  Lately she has been experiencing increasing neck shoulder and arm pain.  Because of her extensive history of spondylitic disease, and implanted hardware in the cervical spine imaging has become increasingly difficult.  Total myelogram is now being planned to evaluate the entire neural axis from cervical to the sacrum.  Pre op Dx: Spondylosis cervical thoracic and lumbar, myelopathy Post op Dx: Same Procedure: Total myelogram Surgeon: Rael Yo Puncture level: L3-4 Fluid color: Clear colorless Injection: Iohexol 300, 9 mL Findings: Severe spondylitic disease throughout the cervical thoracic and lumbar spines.  Poor myelographic opacification of the cervical spine.  Further evaluation with CT scanning.

## 2021-07-23 DIAGNOSIS — H31012 Macula scars of posterior pole (postinflammatory) (post-traumatic), left eye: Secondary | ICD-10-CM | POA: Diagnosis not present

## 2021-07-23 DIAGNOSIS — H524 Presbyopia: Secondary | ICD-10-CM | POA: Diagnosis not present

## 2021-07-23 DIAGNOSIS — H25811 Combined forms of age-related cataract, right eye: Secondary | ICD-10-CM | POA: Diagnosis not present

## 2021-07-23 DIAGNOSIS — Z961 Presence of intraocular lens: Secondary | ICD-10-CM | POA: Diagnosis not present

## 2021-07-23 DIAGNOSIS — H5203 Hypermetropia, bilateral: Secondary | ICD-10-CM | POA: Diagnosis not present

## 2021-07-29 DIAGNOSIS — Z6841 Body Mass Index (BMI) 40.0 and over, adult: Secondary | ICD-10-CM | POA: Diagnosis not present

## 2021-07-29 DIAGNOSIS — Z1231 Encounter for screening mammogram for malignant neoplasm of breast: Secondary | ICD-10-CM | POA: Diagnosis not present

## 2021-07-29 DIAGNOSIS — Z01419 Encounter for gynecological examination (general) (routine) without abnormal findings: Secondary | ICD-10-CM | POA: Diagnosis not present

## 2021-07-31 DIAGNOSIS — M8589 Other specified disorders of bone density and structure, multiple sites: Secondary | ICD-10-CM | POA: Diagnosis not present

## 2021-07-31 DIAGNOSIS — I10 Essential (primary) hypertension: Secondary | ICD-10-CM | POA: Diagnosis not present

## 2021-07-31 DIAGNOSIS — E039 Hypothyroidism, unspecified: Secondary | ICD-10-CM | POA: Diagnosis not present

## 2021-08-05 ENCOUNTER — Other Ambulatory Visit: Payer: Self-pay | Admitting: Obstetrics and Gynecology

## 2021-08-05 DIAGNOSIS — R928 Other abnormal and inconclusive findings on diagnostic imaging of breast: Secondary | ICD-10-CM

## 2021-08-07 DIAGNOSIS — R82998 Other abnormal findings in urine: Secondary | ICD-10-CM | POA: Diagnosis not present

## 2021-08-07 DIAGNOSIS — I700 Atherosclerosis of aorta: Secondary | ICD-10-CM | POA: Diagnosis not present

## 2021-08-07 DIAGNOSIS — I10 Essential (primary) hypertension: Secondary | ICD-10-CM | POA: Diagnosis not present

## 2021-08-07 DIAGNOSIS — D649 Anemia, unspecified: Secondary | ICD-10-CM | POA: Diagnosis not present

## 2021-08-07 DIAGNOSIS — Z6841 Body Mass Index (BMI) 40.0 and over, adult: Secondary | ICD-10-CM | POA: Diagnosis not present

## 2021-08-07 DIAGNOSIS — Z7901 Long term (current) use of anticoagulants: Secondary | ICD-10-CM | POA: Diagnosis not present

## 2021-08-07 DIAGNOSIS — R2689 Other abnormalities of gait and mobility: Secondary | ICD-10-CM | POA: Diagnosis not present

## 2021-08-07 DIAGNOSIS — G894 Chronic pain syndrome: Secondary | ICD-10-CM | POA: Diagnosis not present

## 2021-08-07 DIAGNOSIS — Z0000 Encounter for general adult medical examination without abnormal findings: Secondary | ICD-10-CM | POA: Diagnosis not present

## 2021-08-07 DIAGNOSIS — N1832 Chronic kidney disease, stage 3b: Secondary | ICD-10-CM | POA: Diagnosis not present

## 2021-08-07 DIAGNOSIS — I129 Hypertensive chronic kidney disease with stage 1 through stage 4 chronic kidney disease, or unspecified chronic kidney disease: Secondary | ICD-10-CM | POA: Diagnosis not present

## 2021-08-07 DIAGNOSIS — Z1212 Encounter for screening for malignant neoplasm of rectum: Secondary | ICD-10-CM | POA: Diagnosis not present

## 2021-08-07 DIAGNOSIS — G609 Hereditary and idiopathic neuropathy, unspecified: Secondary | ICD-10-CM | POA: Diagnosis not present

## 2021-08-07 DIAGNOSIS — Z23 Encounter for immunization: Secondary | ICD-10-CM | POA: Diagnosis not present

## 2021-08-07 DIAGNOSIS — E042 Nontoxic multinodular goiter: Secondary | ICD-10-CM | POA: Diagnosis not present

## 2021-08-18 DIAGNOSIS — R051 Acute cough: Secondary | ICD-10-CM | POA: Diagnosis not present

## 2021-08-18 DIAGNOSIS — I350 Nonrheumatic aortic (valve) stenosis: Secondary | ICD-10-CM | POA: Diagnosis not present

## 2021-08-18 DIAGNOSIS — I129 Hypertensive chronic kidney disease with stage 1 through stage 4 chronic kidney disease, or unspecified chronic kidney disease: Secondary | ICD-10-CM | POA: Diagnosis not present

## 2021-08-18 DIAGNOSIS — J069 Acute upper respiratory infection, unspecified: Secondary | ICD-10-CM | POA: Diagnosis not present

## 2021-08-18 DIAGNOSIS — N1832 Chronic kidney disease, stage 3b: Secondary | ICD-10-CM | POA: Diagnosis not present

## 2021-08-24 ENCOUNTER — Ambulatory Visit
Admission: RE | Admit: 2021-08-24 | Discharge: 2021-08-24 | Disposition: A | Discharge status: Home / Self Care | Payer: PPO | Source: Ambulatory Visit | Attending: Obstetrics and Gynecology | Admitting: Obstetrics and Gynecology

## 2021-08-24 ENCOUNTER — Other Ambulatory Visit: Payer: Self-pay | Admitting: Obstetrics and Gynecology

## 2021-08-24 DIAGNOSIS — R928 Other abnormal and inconclusive findings on diagnostic imaging of breast: Secondary | ICD-10-CM

## 2021-08-24 DIAGNOSIS — R922 Inconclusive mammogram: Secondary | ICD-10-CM | POA: Diagnosis not present

## 2021-08-24 DIAGNOSIS — N6320 Unspecified lump in the left breast, unspecified quadrant: Secondary | ICD-10-CM

## 2021-09-01 ENCOUNTER — Ambulatory Visit
Admission: RE | Admit: 2021-09-01 | Discharge: 2021-09-01 | Disposition: A | Discharge status: Home / Self Care | Payer: PPO | Source: Ambulatory Visit | Attending: Obstetrics and Gynecology | Admitting: Obstetrics and Gynecology

## 2021-09-01 ENCOUNTER — Other Ambulatory Visit: Payer: Self-pay

## 2021-09-01 DIAGNOSIS — N6012 Diffuse cystic mastopathy of left breast: Secondary | ICD-10-CM | POA: Diagnosis not present

## 2021-09-01 DIAGNOSIS — N6320 Unspecified lump in the left breast, unspecified quadrant: Secondary | ICD-10-CM

## 2021-09-01 DIAGNOSIS — N6321 Unspecified lump in the left breast, upper outer quadrant: Secondary | ICD-10-CM | POA: Diagnosis not present

## 2021-09-01 HISTORY — PX: BREAST BIOPSY: SHX20

## 2021-10-01 ENCOUNTER — Emergency Department (HOSPITAL_COMMUNITY): Payer: PPO

## 2021-10-01 ENCOUNTER — Encounter (HOSPITAL_COMMUNITY): Payer: Self-pay

## 2021-10-01 ENCOUNTER — Inpatient Hospital Stay (HOSPITAL_COMMUNITY)
Admission: EM | Admit: 2021-10-01 | Discharge: 2021-10-09 | DRG: 460 | Disposition: A | Discharge status: Home Health | Payer: PPO | Attending: Neurological Surgery | Admitting: Neurological Surgery

## 2021-10-01 DIAGNOSIS — Z6841 Body Mass Index (BMI) 40.0 and over, adult: Secondary | ICD-10-CM

## 2021-10-01 DIAGNOSIS — M549 Dorsalgia, unspecified: Secondary | ICD-10-CM | POA: Diagnosis not present

## 2021-10-01 DIAGNOSIS — S0990XA Unspecified injury of head, initial encounter: Secondary | ICD-10-CM | POA: Diagnosis not present

## 2021-10-01 DIAGNOSIS — S22060A Wedge compression fracture of T7-T8 vertebra, initial encounter for closed fracture: Secondary | ICD-10-CM | POA: Diagnosis not present

## 2021-10-01 DIAGNOSIS — M25562 Pain in left knee: Secondary | ICD-10-CM | POA: Diagnosis present

## 2021-10-01 DIAGNOSIS — I129 Hypertensive chronic kidney disease with stage 1 through stage 4 chronic kidney disease, or unspecified chronic kidney disease: Secondary | ICD-10-CM | POA: Diagnosis not present

## 2021-10-01 DIAGNOSIS — S22060G Wedge compression fracture of T7-T8 vertebra, subsequent encounter for fracture with delayed healing: Secondary | ICD-10-CM | POA: Diagnosis not present

## 2021-10-01 DIAGNOSIS — Z96653 Presence of artificial knee joint, bilateral: Secondary | ICD-10-CM | POA: Diagnosis not present

## 2021-10-01 DIAGNOSIS — S22068A Other fracture of T7-T8 thoracic vertebra, initial encounter for closed fracture: Principal | ICD-10-CM | POA: Diagnosis present

## 2021-10-01 DIAGNOSIS — M542 Cervicalgia: Secondary | ICD-10-CM | POA: Diagnosis present

## 2021-10-01 DIAGNOSIS — I10 Essential (primary) hypertension: Secondary | ICD-10-CM | POA: Diagnosis not present

## 2021-10-01 DIAGNOSIS — M532X4 Spinal instabilities, thoracic region: Secondary | ICD-10-CM | POA: Diagnosis present

## 2021-10-01 DIAGNOSIS — E039 Hypothyroidism, unspecified: Secondary | ICD-10-CM | POA: Diagnosis present

## 2021-10-01 DIAGNOSIS — K219 Gastro-esophageal reflux disease without esophagitis: Secondary | ICD-10-CM | POA: Diagnosis present

## 2021-10-01 DIAGNOSIS — I2510 Atherosclerotic heart disease of native coronary artery without angina pectoris: Secondary | ICD-10-CM | POA: Diagnosis not present

## 2021-10-01 DIAGNOSIS — Z96642 Presence of left artificial hip joint: Secondary | ICD-10-CM | POA: Diagnosis present

## 2021-10-01 DIAGNOSIS — Z86711 Personal history of pulmonary embolism: Secondary | ICD-10-CM

## 2021-10-01 DIAGNOSIS — M25552 Pain in left hip: Secondary | ICD-10-CM | POA: Diagnosis not present

## 2021-10-01 DIAGNOSIS — M2578 Osteophyte, vertebrae: Secondary | ICD-10-CM | POA: Diagnosis not present

## 2021-10-01 DIAGNOSIS — I700 Atherosclerosis of aorta: Secondary | ICD-10-CM | POA: Diagnosis not present

## 2021-10-01 DIAGNOSIS — Z7901 Long term (current) use of anticoagulants: Secondary | ICD-10-CM

## 2021-10-01 DIAGNOSIS — Z86718 Personal history of other venous thrombosis and embolism: Secondary | ICD-10-CM

## 2021-10-01 DIAGNOSIS — Z79899 Other long term (current) drug therapy: Secondary | ICD-10-CM | POA: Diagnosis not present

## 2021-10-01 DIAGNOSIS — S22069A Unspecified fracture of T7-T8 vertebra, initial encounter for closed fracture: Secondary | ICD-10-CM | POA: Diagnosis not present

## 2021-10-01 DIAGNOSIS — Z90710 Acquired absence of both cervix and uterus: Secondary | ICD-10-CM | POA: Diagnosis not present

## 2021-10-01 DIAGNOSIS — Z96611 Presence of right artificial shoulder joint: Secondary | ICD-10-CM | POA: Diagnosis present

## 2021-10-01 DIAGNOSIS — Z96652 Presence of left artificial knee joint: Secondary | ICD-10-CM | POA: Diagnosis not present

## 2021-10-01 DIAGNOSIS — Z8249 Family history of ischemic heart disease and other diseases of the circulatory system: Secondary | ICD-10-CM

## 2021-10-01 DIAGNOSIS — W01190A Fall on same level from slipping, tripping and stumbling with subsequent striking against furniture, initial encounter: Secondary | ICD-10-CM | POA: Diagnosis present

## 2021-10-01 DIAGNOSIS — Z20822 Contact with and (suspected) exposure to covid-19: Secondary | ICD-10-CM | POA: Diagnosis present

## 2021-10-01 DIAGNOSIS — M545 Low back pain, unspecified: Secondary | ICD-10-CM | POA: Diagnosis not present

## 2021-10-01 DIAGNOSIS — Z95828 Presence of other vascular implants and grafts: Secondary | ICD-10-CM | POA: Diagnosis not present

## 2021-10-01 DIAGNOSIS — R519 Headache, unspecified: Secondary | ICD-10-CM | POA: Diagnosis present

## 2021-10-01 DIAGNOSIS — Z419 Encounter for procedure for purposes other than remedying health state, unspecified: Secondary | ICD-10-CM

## 2021-10-01 DIAGNOSIS — S299XXA Unspecified injury of thorax, initial encounter: Secondary | ICD-10-CM | POA: Diagnosis not present

## 2021-10-01 DIAGNOSIS — Y92009 Unspecified place in unspecified non-institutional (private) residence as the place of occurrence of the external cause: Secondary | ICD-10-CM | POA: Diagnosis not present

## 2021-10-01 DIAGNOSIS — W19XXXA Unspecified fall, initial encounter: Secondary | ICD-10-CM

## 2021-10-01 DIAGNOSIS — N182 Chronic kidney disease, stage 2 (mild): Secondary | ICD-10-CM | POA: Diagnosis not present

## 2021-10-01 DIAGNOSIS — I25110 Atherosclerotic heart disease of native coronary artery with unstable angina pectoris: Secondary | ICD-10-CM | POA: Diagnosis not present

## 2021-10-01 DIAGNOSIS — D631 Anemia in chronic kidney disease: Secondary | ICD-10-CM | POA: Diagnosis not present

## 2021-10-01 DIAGNOSIS — Z83438 Family history of other disorder of lipoprotein metabolism and other lipidemia: Secondary | ICD-10-CM

## 2021-10-01 DIAGNOSIS — Z833 Family history of diabetes mellitus: Secondary | ICD-10-CM

## 2021-10-01 DIAGNOSIS — N183 Chronic kidney disease, stage 3 unspecified: Secondary | ICD-10-CM | POA: Diagnosis not present

## 2021-10-01 DIAGNOSIS — Z043 Encounter for examination and observation following other accident: Secondary | ICD-10-CM | POA: Diagnosis not present

## 2021-10-01 DIAGNOSIS — Z981 Arthrodesis status: Secondary | ICD-10-CM | POA: Diagnosis not present

## 2021-10-01 LAB — COMPREHENSIVE METABOLIC PANEL
ALT: 16 U/L (ref 0–44)
AST: 27 U/L (ref 15–41)
Albumin: 3.8 g/dL (ref 3.5–5.0)
Alkaline Phosphatase: 52 U/L (ref 38–126)
Anion gap: 6 (ref 5–15)
BUN: 21 mg/dL (ref 8–23)
CO2: 29 mmol/L (ref 22–32)
Calcium: 9.7 mg/dL (ref 8.9–10.3)
Chloride: 101 mmol/L (ref 98–111)
Creatinine, Ser: 1.4 mg/dL — ABNORMAL HIGH (ref 0.44–1.00)
GFR, Estimated: 39 mL/min — ABNORMAL LOW (ref >60)
Glucose, Bld: 126 mg/dL — ABNORMAL HIGH (ref 70–99)
Potassium: 5.1 mmol/L (ref 3.5–5.1)
Sodium: 136 mmol/L (ref 135–145)
Total Bilirubin: 0.6 mg/dL (ref 0.3–1.2)
Total Protein: 6.8 g/dL (ref 6.5–8.1)

## 2021-10-01 LAB — CBC
HCT: 35 % — ABNORMAL LOW (ref 36.0–46.0)
Hemoglobin: 11.1 g/dL — ABNORMAL LOW (ref 12.0–15.0)
MCH: 29.4 pg (ref 26.0–34.0)
MCHC: 31.7 g/dL (ref 30.0–36.0)
MCV: 92.6 fL (ref 80.0–100.0)
Platelets: 201 10*3/uL (ref 150–400)
RBC: 3.78 MIL/uL — ABNORMAL LOW (ref 3.87–5.11)
RDW: 12.7 % (ref 11.5–15.5)
WBC: 11.9 10*3/uL — ABNORMAL HIGH (ref 4.0–10.5)
nRBC: 0 % (ref 0.0–0.2)

## 2021-10-01 LAB — APTT: aPTT: 34 seconds (ref 24–36)

## 2021-10-01 LAB — PROTIME-INR
INR: 1.3 — ABNORMAL HIGH (ref 0.8–1.2)
Prothrombin Time: 16.5 seconds — ABNORMAL HIGH (ref 11.4–15.2)

## 2021-10-01 LAB — RESP PANEL BY RT-PCR (FLU A&B, COVID) ARPGX2
Influenza A by PCR: NEGATIVE
Influenza B by PCR: NEGATIVE
SARS Coronavirus 2 by RT PCR: NEGATIVE

## 2021-10-01 LAB — HEPARIN LEVEL (UNFRACTIONATED): Heparin Unfractionated: >1.1 IU/mL — ABNORMAL HIGH (ref 0.30–0.70)

## 2021-10-01 MED ORDER — SODIUM CHLORIDE 0.9 % IV SOLN: INTRAVENOUS | Status: DC

## 2021-10-01 MED ORDER — ACETAMINOPHEN 325 MG PO TABS: 650.0000 mg | ORAL_TABLET | Freq: Four times a day (QID) | ORAL | Status: DC | PRN

## 2021-10-01 MED ORDER — HYDROMORPHONE HCL 1 MG/ML IJ SOLN
0.5000 mg | INTRAMUSCULAR | Status: DC | PRN
  Administered 2021-10-01 – 2021-10-05 (×19): 1 mg via INTRAVENOUS
  Filled 2021-10-01 (×19): qty 1

## 2021-10-01 MED ORDER — ACETAMINOPHEN 650 MG RE SUPP: 650.0000 mg | Freq: Four times a day (QID) | RECTAL | Status: DC | PRN

## 2021-10-01 MED ORDER — BISACODYL 10 MG RE SUPP: 10.0000 mg | Freq: Every day | RECTAL | Status: DC | PRN

## 2021-10-01 MED ORDER — SODIUM CHLORIDE 0.9% FLUSH
3.0000 mL | Freq: Two times a day (BID) | INTRAVENOUS | Status: DC
  Administered 2021-10-01 – 2021-10-09 (×13): 3 mL via INTRAVENOUS

## 2021-10-01 MED ORDER — FLEET ENEMA 7-19 GM/118ML RE ENEM: 1.0000 | ENEMA | Freq: Once | RECTAL | Status: DC | PRN

## 2021-10-01 MED ORDER — HYDROCODONE-ACETAMINOPHEN 5-325 MG PO TABS
1.0000 | ORAL_TABLET | ORAL | Status: DC | PRN
  Administered 2021-10-01 – 2021-10-08 (×19): 2 via ORAL
  Filled 2021-10-01 (×20): qty 2

## 2021-10-01 MED ORDER — DOCUSATE SODIUM 100 MG PO CAPS
100.0000 mg | ORAL_CAPSULE | Freq: Two times a day (BID) | ORAL | Status: DC
  Administered 2021-10-02 – 2021-10-05 (×7): 100 mg via ORAL
  Filled 2021-10-01 (×7): qty 1

## 2021-10-01 MED ORDER — HYDROMORPHONE HCL 1 MG/ML IJ SOLN
1.0000 mg | Freq: Once | INTRAMUSCULAR | Status: AC
  Administered 2021-10-01: 1 mg via INTRAVENOUS
  Filled 2021-10-01: qty 1

## 2021-10-01 MED ORDER — METHOCARBAMOL 1000 MG/10ML IJ SOLN
500.0000 mg | Freq: Four times a day (QID) | INTRAVENOUS | Status: DC | PRN
  Administered 2021-10-04 – 2021-10-07 (×3): 500 mg via INTRAVENOUS
  Filled 2021-10-01: qty 5
  Filled 2021-10-01: qty 500
  Filled 2021-10-01 (×2): qty 5

## 2021-10-01 MED ORDER — FENTANYL CITRATE PF 50 MCG/ML IJ SOSY
25.0000 ug | PREFILLED_SYRINGE | Freq: Once | INTRAMUSCULAR | Status: AC
  Administered 2021-10-01: 25 ug via INTRAVENOUS
  Filled 2021-10-01: qty 1

## 2021-10-01 MED ORDER — HEPARIN (PORCINE) 25000 UT/250ML-% IV SOLN
1450.0000 [IU]/h | INTRAVENOUS | Status: AC
  Administered 2021-10-01: 1400 [IU]/h via INTRAVENOUS
  Administered 2021-10-02: 1500 [IU]/h via INTRAVENOUS
  Administered 2021-10-03 – 2021-10-04 (×3): 1450 [IU]/h via INTRAVENOUS
  Filled 2021-10-01 (×5): qty 250

## 2021-10-01 MED ORDER — POLYETHYLENE GLYCOL 3350 17 G PO PACK: 17.0000 g | PACK | Freq: Every day | ORAL | Status: DC | PRN

## 2021-10-01 NOTE — ED Notes (Signed)
Patient transported to CT 

## 2021-10-01 NOTE — H&P (Signed)
Providing Compassionate, Quality Care - Together  NEUROSURGERY HISTORY & PHYSICAL   [name] is an 75 y.o. female.   Chief Complaint: Fall HPI: This is a 75 yo F with a history of pulmonary emboli, on Xarelto, obesity, hypertension, chronic kidney disease, knee arthropathy bilateral, extensive spine surgical history, most recently T7 kyphoplasty followed by T7 decompression due to radiculopathy in July 2022 by Dr. Ellene Route.  She states that she had a fall today while trying on close and fell onto a bench, which then gave out and she immediately complained of midthoracic back pain.  She was brought to the emergency department and CT scan of the thoracic spine revealed a T7-8 disc base widening and left facet widening concerning for 3 column injury.  She has a history of instrumentation T11-L2 with fusion, including autofusion T8 to pelvis.  At this time she complains of no numbness tingling or weakness, does have some chronic weakness in her right lower extremity.  She denies any truncal decree sensation or sensory level.  She denies any bowel or bladder changes.  Past Medical History:  Diagnosis Date   Anemia    Arthritis    osteoarthritis   Claustrophobia    Complication of anesthesia    hard time waking up after her gallbladder surgery 28 years ago   DVT (deep venous thrombosis) (HCC)    left leg   GERD (gastroesophageal reflux disease)    hx of   Heart murmur    Dr. Tamala Julian follows, 'nothing to worry about.'   History of blood transfusion    History of hiatal hernia    small   History of kidney stones    Hypercholesteremia    under control   Hypertension    Hypothyroidism    Mild aortic stenosis    a. by echo 12/2017.   Panic attack    PONV (postoperative nausea and vomiting)    Pulmonary embolism (HCC)    bilateral lungs - 2008 after disc surgery    Past Surgical History:  Procedure Laterality Date   ABDOMINAL HYSTERECTOMY  10/18/1973   partial   APPENDECTOMY   10/19/1971   BACK SURGERY     [id number]   CARDIAC CATHETERIZATION  ~2010   CARPAL TUNNEL RELEASE Bilateral    CHOLECYSTECTOMY  10/19/1995   COLONOSCOPY     COLONOSCOPY WITH PROPOFOL N/A 12/05/2017   Procedure: COLONOSCOPY WITH PROPOFOL;  Surgeon: Laurence Spates, MD;  Location: [facility];  Service: Endoscopy;  Laterality: N/A;   DILATION AND CURETTAGE OF UTERUS     ENDARTERECTOMY Right 08/10/2013   Procedure: Excision of Venous Aneurysm Right Neck;  Surgeon: Rosetta Posner, MD;  Location: [place];  Service: Vascular;  Laterality: Right;   ESOPHAGOGASTRODUODENOSCOPY (EGD) WITH PROPOFOL N/A 12/05/2017   Procedure: ESOPHAGOGASTRODUODENOSCOPY (EGD) WITH PROPOFOL;  Surgeon: Laurence Spates, MD;  Location: [facility];  Service: Endoscopy;  Laterality: N/A;   filter Left 10/19/2007   IVC   FRACTURE SURGERY Left    left wrist   HIP SURGERY Left 10/18/2006   JOINT REPLACEMENT Right 11/19/2003   (knee)multiple surgeries   JOINT REPLACEMENT Left 12/16/2009   (knee)   JOINT REPLACEMENT Left 2011, 2012   Hip   KYPHOPLASTY N/A 03/03/2021   Procedure: Thoracic seven Kyphoplasty;  Surgeon: Kristeen Miss, MD;  Location: [place];  Service: Neurosurgery;  Laterality: N/A;   KYPHOPLASTY N/A 03/31/2021   Procedure: Thoracic seven Kyphoplasty;  Surgeon: Kristeen Miss, MD;  Location: [place];  Service: Neurosurgery;  Laterality: N/A;   LUMBAR LAMINECTOMY/ DECOMPRESSION WITH MET-RX Right 04/17/2021   Procedure: Right Thoracic Seven-Eight  Extraforaminal decompression/foraminotomy;  Surgeon: Kristeen Miss, MD;  Location: [place];  Service: Neurosurgery;  Laterality: Right;   MASS EXCISION  10/18/1972   uterus   QUADRICEPS TENDON REPAIR Right 12/06/2016   Procedure: QUAD TENDON RECONSTRUCTION;  Surgeon: Paralee Cancel, MD;  Location: [facility];  Service: Orthopedics;  Laterality: Right;  Requests for 90 mins   RADIOLOGY WITH ANESTHESIA N/A 01/20/2021   Procedure: MRI WITH [facility] PAIN WITHOUT CONTRAST;  Surgeon:  Radiologist, Medication, MD;  Location: [place];  Service: Radiology;  Laterality: N/A;   TONSILLECTOMY  10/18/1984   TOTAL HIP REVISION Left 12/07/2013   Procedure:  REVISION  CONSTRAINED LINER LEFT TOTAL HIP ;  Surgeon: Mauri Pole, MD;  Location: [facility];  Service: Orthopedics;  Laterality: Left;   TOTAL SHOULDER REPLACEMENT Right 11/30/2010   TUBAL LIGATION  10/19/1971    Family History  Problem Relation Age of Onset   Diabetes Mother    Heart disease Mother        before age 44 - ?thick blood - pt does not know if patient had formal heart issue but heart stopped   Hyperlipidemia Mother    Hypertension Mother    Diabetes Father    Hyperlipidemia Father    Hypertension Father    Heart attack Father        in his 83s during adm for flu   Peripheral vascular disease Father    Other Father        amputation   Diabetes Sister    Hyperlipidemia Sister    Hypertension Sister    Deep vein thrombosis Brother    Hyperlipidemia Brother    Hypertension Brother    Social History:  reports that she has never smoked. She has never used smokeless tobacco. She reports that she does not drink alcohol and does not use drugs.  Allergies:  Allergies  Allergen Reactions   Celecoxib Rash    (Not in a hospital admission)   No results found for this or any previous visit (from the past 48 hour(s)). CT Head Wo Contrast  Result Date: 10/01/2021 CLINICAL DATA:  Head trauma, moderate/severe. Neck trauma, dangerous injury mechanism. Additional history provided: Fall (hitting back of head). Patient reports neck pain and midline thoracic pain. EXAM: CT HEAD WITHOUT CONTRAST CT CERVICAL SPINE WITHOUT CONTRAST TECHNIQUE: Multidetector CT imaging of the head and cervical spine was performed following the standard protocol without intravenous contrast. Multiplanar CT image reconstructions of the cervical spine were also generated. COMPARISON:  Head CT 05/24/2021. Cervical CT myelogram 06/17/2021. CT  cervical spine 05/24/2021. FINDINGS: CT HEAD FINDINGS Brain: Mildly motion degraded exam. Mild generalized cerebral atrophy. Partially empty sella turcica. There is no acute intracranial hemorrhage. No demarcated cortical infarct. No extra-axial fluid collection. No evidence of an intracranial mass. No midline shift. Vascular: No hyperdense vessel. Atherosclerotic calcifications. Skull: Normal. Negative for fracture or focal lesion. Sinuses/Orbits: Visualized orbits show no acute finding. Small fluid level within the right sphenoid sinus. CT CERVICAL SPINE FINDINGS Mildly motion degraded exam. Alignment: Reversal of the expected cervical lordosis. 2 mm C2-C3 grade 1 retrolisthesis. Trace C6-C7 grade 1 retrolisthesis, unchanged. Skull base and vertebrae: The basion-dental and atlanto-dental intervals are maintained.No evidence of acute fracture to the cervical spine. Sequela of prior fusion at the C3-C5 levels. Ventral plate and screws at H8-I5. Interbody devices at C3-C4 and C4-C5. Facet joint ankylosis  bilaterally at the C3-C4 and C4-C5 levels. Soft tissues and spinal canal: No prevertebral fluid or swelling. No visible canal hematoma. Disc levels: Cervical spondylosis with multilevel disc space narrowing, disc bulges, posterior disc osteophytes, endplate spurring, uncovertebral hypertrophy and facet arthrosis. Redemonstrated small to moderate sized central disc protrusion at C2-C3. Disc space narrowing is greatest at C5-C6 and C6-C7 (advanced at these levels). Multilevel spinal canal stenosis. Most notably, there is apparent moderate spinal canal stenosis at C2-C3. Multilevel bony neural foraminal narrowing. Upper chest: No consolidation within the imaged lung apices. No visible pneumothorax. IMPRESSION: CT head: 1. Mildly motion degraded exam. 2. No evidence of acute intracranial abnormality. 3. Mild generalized cerebral atrophy. 4. Right sphenoid sinusitis. CT cervical spine: 1. Mildly motion degraded exam. 2.  Reversal of the expected cervical lordosis. 3. 2 mm C2-C3 grade 1 retrolisthesis, unchanged. 4. Trace C6-C7 grade 1 retrolisthesis, unchanged. 5. Sequela of prior fusion at the C3-C5 levels, as described. 6. Cervical spondylosis, as outlined. Notably, a small to moderate sized central disc protrusion contributes to apparent moderate spinal canal stenosis at C2-C3. Multilevel bony neural foraminal narrowing. Electronically Signed   By: Kellie Simmering D.O.   On: 10/01/2021 15:23   CT Cervical Spine Wo Contrast  Result Date: 10/01/2021 CLINICAL DATA:  Head trauma, moderate/severe. Neck trauma, dangerous injury mechanism. Additional history provided: Fall (hitting back of head). Patient reports neck pain and midline thoracic pain. EXAM: CT HEAD WITHOUT CONTRAST CT CERVICAL SPINE WITHOUT CONTRAST TECHNIQUE: Multidetector CT imaging of the head and cervical spine was performed following the standard protocol without intravenous contrast. Multiplanar CT image reconstructions of the cervical spine were also generated. COMPARISON:  Head CT 05/24/2021. Cervical CT myelogram 06/17/2021. CT cervical spine 05/24/2021. FINDINGS: CT HEAD FINDINGS Brain: Mildly motion degraded exam. Mild generalized cerebral atrophy. Partially empty sella turcica. There is no acute intracranial hemorrhage. No demarcated cortical infarct. No extra-axial fluid collection. No evidence of an intracranial mass. No midline shift. Vascular: No hyperdense vessel. Atherosclerotic calcifications. Skull: Normal. Negative for fracture or focal lesion. Sinuses/Orbits: Visualized orbits show no acute finding. Small fluid level within the right sphenoid sinus. CT CERVICAL SPINE FINDINGS Mildly motion degraded exam. Alignment: Reversal of the expected cervical lordosis. 2 mm C2-C3 grade 1 retrolisthesis. Trace C6-C7 grade 1 retrolisthesis, unchanged. Skull base and vertebrae: The basion-dental and atlanto-dental intervals are maintained.No evidence of acute  fracture to the cervical spine. Sequela of prior fusion at the C3-C5 levels. Ventral plate and screws at W0-J8. Interbody devices at C3-C4 and C4-C5. Facet joint ankylosis bilaterally at the C3-C4 and C4-C5 levels. Soft tissues and spinal canal: No prevertebral fluid or swelling. No visible canal hematoma. Disc levels: Cervical spondylosis with multilevel disc space narrowing, disc bulges, posterior disc osteophytes, endplate spurring, uncovertebral hypertrophy and facet arthrosis. Redemonstrated small to moderate sized central disc protrusion at C2-C3. Disc space narrowing is greatest at C5-C6 and C6-C7 (advanced at these levels). Multilevel spinal canal stenosis. Most notably, there is apparent moderate spinal canal stenosis at C2-C3. Multilevel bony neural foraminal narrowing. Upper chest: No consolidation within the imaged lung apices. No visible pneumothorax. IMPRESSION: CT head: 1. Mildly motion degraded exam. 2. No evidence of acute intracranial abnormality. 3. Mild generalized cerebral atrophy. 4. Right sphenoid sinusitis. CT cervical spine: 1. Mildly motion degraded exam. 2. Reversal of the expected cervical lordosis. 3. 2 mm C2-C3 grade 1 retrolisthesis, unchanged. 4. Trace C6-C7 grade 1 retrolisthesis, unchanged. 5. Sequela of prior fusion at the C3-C5 levels, as described. 6. Cervical spondylosis,  as outlined. Notably, a small to moderate sized central disc protrusion contributes to apparent moderate spinal canal stenosis at C2-C3. Multilevel bony neural foraminal narrowing. Electronically Signed   By: Kellie Simmering D.O.   On: 10/01/2021 15:23   CT Thoracic Spine Wo Contrast  Result Date: 10/01/2021 CLINICAL DATA:  Fall. EXAM: CT THORACIC AND LUMBAR SPINE WITHOUT CONTRAST TECHNIQUE: Multidetector CT imaging of the thoracic and lumbar spine was performed without contrast. Multiplanar CT image reconstructions were also generated. COMPARISON:  CT total myelogram dated June 17, 2021. FINDINGS: CT  THORACIC SPINE FINDINGS Alignment: No significant listhesis. Vertebrae: Distraction type fracture through the T7-T8 anterior endplate osteophyte with widening of the disc space anteriorly (series 7, image 51). New asymmetric widening of the left facet joint (series 7, image 60). Chronic right-sided laminotomy/foraminotomy. No additional fracture. Prior T7 and T10 vertebroplasties with unchanged ventral epidural cement at T10 more than T7. Prior thoracolumbar posterior fusion. Paraspinal and other soft tissues: Small amount of paravertebral hematoma at T7-T8. Mild cardiomegaly. Trace right pleural effusion. Disc levels: Similar mild multilevel degenerative disc disease and moderate facet arthropathy of the upper and midthoracic spine. CT LUMBAR SPINE FINDINGS Segmentation: Unchanged lumbarization of S1. Alignment: Unchanged mild dextroscoliosis.  No listhesis. Vertebrae: No acute fracture or other focal pathologic process. Paraspinal and other soft tissues: IVC filter. Aortoiliac atherosclerotic vascular calcification. Disc levels: Solid arthrodesis throughout the lumbar spine. IMPRESSION: 1. Hyperextension injury at T7-T8 with distraction type fracture through an anterior endplate osteophyte and the disc space with widening of the disc space anteriorly and asymmetric widening of the left facet joint. 2. No acute lumbar spine fracture. 3. Solid arthrodesis from T9 to the sacrum. 4. Aortic Atherosclerosis (ICD10-I70.0). Electronically Signed   By: Titus Dubin M.D.   On: 10/01/2021 17:07   CT Lumbar Spine Wo Contrast  Result Date: 10/01/2021 CLINICAL DATA:  Fall. EXAM: CT THORACIC AND LUMBAR SPINE WITHOUT CONTRAST TECHNIQUE: Multidetector CT imaging of the thoracic and lumbar spine was performed without contrast. Multiplanar CT image reconstructions were also generated. COMPARISON:  CT total myelogram dated June 17, 2021. FINDINGS: CT THORACIC SPINE FINDINGS Alignment: No significant listhesis. Vertebrae:  Distraction type fracture through the T7-T8 anterior endplate osteophyte with widening of the disc space anteriorly (series 7, image 51). New asymmetric widening of the left facet joint (series 7, image 60). Chronic right-sided laminotomy/foraminotomy. No additional fracture. Prior T7 and T10 vertebroplasties with unchanged ventral epidural cement at T10 more than T7. Prior thoracolumbar posterior fusion. Paraspinal and other soft tissues: Small amount of paravertebral hematoma at T7-T8. Mild cardiomegaly. Trace right pleural effusion. Disc levels: Similar mild multilevel degenerative disc disease and moderate facet arthropathy of the upper and midthoracic spine. CT LUMBAR SPINE FINDINGS Segmentation: Unchanged lumbarization of S1. Alignment: Unchanged mild dextroscoliosis.  No listhesis. Vertebrae: No acute fracture or other focal pathologic process. Paraspinal and other soft tissues: IVC filter. Aortoiliac atherosclerotic vascular calcification. Disc levels: Solid arthrodesis throughout the lumbar spine. IMPRESSION: 1. Hyperextension injury at T7-T8 with distraction type fracture through an anterior endplate osteophyte and the disc space with widening of the disc space anteriorly and asymmetric widening of the left facet joint. 2. No acute lumbar spine fracture. 3. Solid arthrodesis from T9 to the sacrum. 4. Aortic Atherosclerosis (ICD10-I70.0). Electronically Signed   By: Titus Dubin M.D.   On: 10/01/2021 17:07   CT PELVIS WO CONTRAST  Result Date: 10/01/2021 CLINICAL DATA:  Fall. EXAM: CT PELVIS WITHOUT CONTRAST TECHNIQUE: Multidetector CT imaging of the pelvis was  performed following the standard protocol without intravenous contrast. COMPARISON:  CT abdomen and pelvis dated December 15, 2006. FINDINGS: Urinary Tract:  No abnormality visualized. Bowel:  Unremarkable visualized pelvic bowel loops. Vascular/Lymphatic: Aortoiliac atherosclerotic vascular disease. Partially visualized IVC filter. No  enlarged pelvic lymph nodes. Reproductive:  Hysterectomy.  No adnexal mass. Other:  None. Musculoskeletal: No acute fracture or dislocation. Prior left total hip arthroplasty. No evidence of hardware failure or loosening. IMPRESSION: 1. No acute osseous abnormality. 2. Prior left total hip arthroplasty without evidence of hardware failure or loosening. 3. Aortic Atherosclerosis (ICD10-I70.0). Electronically Signed   By: Titus Dubin M.D.   On: 10/01/2021 16:47   DG Chest Portable 1 View  Result Date: 10/01/2021 CLINICAL DATA:  Fall EXAM: PORTABLE CHEST 1 VIEW COMPARISON:  None. FINDINGS: The heart size and mediastinal contours are within normal limits. Atherosclerotic calcification of aortic arch. Both lungs are clear. No evidence of acute fracture. Status post right shoulder arthroplasty. Bibipedicle screws and rod fixation at the thoracolumbar junction. IMPRESSION: No acute cardiopulmonary process. Electronically Signed   By: Keane Police D.O.   On: 10/01/2021 14:55   DG Knee Complete 4 Views Left  Result Date: 10/01/2021 CLINICAL DATA:  Fall EXAM: LEFT KNEE - COMPLETE 4+ VIEW COMPARISON:  None. FINDINGS: Left total knee arthroplasty is present. There is no periprosthetic fracture. No definite joint effusion. Well corticated ossific density superior to the patella. IMPRESSION: No acute fracture. Electronically Signed   By: Macy Mis M.D.   On: 10/01/2021 15:01   DG Hip Unilat W or Wo Pelvis 2-3 Views Left  Result Date: 10/01/2021 CLINICAL DATA:  Fall EXAM: DG HIP (WITH OR WITHOUT PELVIS) 2-3V LEFT COMPARISON:  None. FINDINGS: Status post left hip total arthroplasty. No displaced fracture or dislocation of the pelvis or bilateral proximal femurs, evaluation of the pelvis significantly limited by habitus and underpenetration. IMPRESSION: 1.  Status post left hip total arthroplasty. 2. No displaced fracture or dislocation of the pelvis or bilateral proximal femurs, evaluation of the pelvis  significantly limited by habitus and underpenetration. Recommend CT if there is high clinical suspicion for fracture. Electronically Signed   By: Delanna Ahmadi M.D.   On: 10/01/2021 14:54    ROS All pertinent positives and negatives are listed HPI above  Blood pressure (!) 150/86, pulse 71, temperature 97.8 F (36.6 C), temperature source Oral, resp. rate 19, height _0  (1.6 m), weight 117.9 kg, SpO2 97 %. Physical Exam  Awake alert oriented x3, mild distress due to pain PERRLA EOMI Cranial nerves II through XII intact  Speech fluent and appropriate Bilateral upper extremities, bilateral lower extremities 4/5 throughout, right shoulder is limited due to significant arthropathy. No decreased sensation to light touch throughout the trunk or lower extremities  Assessment/Plan 75 yo F with:  T7-8 three column fracture   -Discussed with the patient and family at bedside.   -Would recommend surgical intervention in the form of instrumentation and fusion to stabilize her fracture.   -We will admit her -pain control -strict bedrest, logroll -I will bridge her with IV heparin due to her Xarelto and history of significant pulmonary emboli. -MRI w anesthesia and CT T spine with mazor robot protocol for surgical planning -at this time she is at her neurologic baseline there does not need emergent surgical intervention -neuro checks   Thank you for allowing me to participate in this patient's care.  Please do not hesitate to call with questions or concerns.   [place] Charli Halle,  [facility] Cell: [phone]

## 2021-10-01 NOTE — Progress Notes (Addendum)
ANTICOAGULATION CONSULT NOTE - Initial Consult  Pharmacy Consult for Heparin Indication: Hx of VTE  Allergies  Allergen Reactions   Celecoxib Rash   Patient Measurements: Height: 5\' 3"  (160 cm) Weight: 117.9 kg (260 lb) IBW/kg (Calculated) : 52.4 Heparin Dosing Weight: 81.2 kg  Vital Signs: Temp: 97.8 F (36.6 C) (12/15 1415) Temp Source: Oral (12/15 1415) BP: 150/86 (12/15 1915) Pulse Rate: 71 (12/15 1915)  Labs: No results for input(s): HGB, HCT, PLT, APTT, LABPROT, INR, HEPARINUNFRC, HEPRLOWMOCWT, CREATININE, CKTOTAL, CKMB, TROPONINIHS in the last 72 hours.  CrCl cannot be calculated (Patient's most recent lab result is older than the maximum 21 days allowed.).   Medical History: Past Medical History:  Diagnosis Date   Anemia    Arthritis    osteoarthritis   Claustrophobia    Complication of anesthesia    hard time waking up after her gallbladder surgery 28 years ago   DVT (deep venous thrombosis) (HCC)    left leg   GERD (gastroesophageal reflux disease)    hx of   Heart murmur    Dr. 02-01-2003 follows, 'nothing to worry about.'   History of blood transfusion    History of hiatal hernia    small   History of kidney stones    Hypercholesteremia    under control   Hypertension    Hypothyroidism    Mild aortic stenosis    a. by echo 12/2017.   Panic attack    PONV (postoperative nausea and vomiting)    Pulmonary embolism (HCC)    bilateral lungs - 2008 after disc surgery    Assessment: 75 yo female with a history of recurrent DVT. Patient is presenting after a fall at home. Heparin per pharmacy consult placed for Hx of VTE.  Patient is on Rivaroxaban 20mg  daily prior to arrival. Last dose yesterday (12/14) at 4PM. Will require aPTT monitoring due to likely falsely high anti-Xa level secondary to DOAC use.  Hgb ordered;plt ordered  Goal of Therapy:  Heparin level 0.3-0.7 units/ml aPTT 66-102 seconds Monitor platelets by anticoagulation protocol: Yes    Plan:  No initial heparin bolus Start heparin infusion at 1400 units/hr Baseline aPTT/Heparin level & CBC w/ diff ordered Check aPTT & anti-Xa level in 8 hours and daily while on heparin Continue to monitor via aPTT until levels are correlated Continue to monitor H&H and platelets  PharmD Candidate

## 2021-10-01 NOTE — Progress Notes (Signed)
Orthopedic Tech Progress Note Patient Details:  [name] 06/04/1946 [id number]  Patient ID: [name], female   DOB: September 16, 1946, 75 y.o.   MRN: [id number]  Delorise Royals Cap Massi 10/01/2021, 2:16 PM Responded to level 2 trauma

## 2021-10-01 NOTE — ED Notes (Signed)
ED Provider at bedside. 

## 2021-10-01 NOTE — ED Notes (Signed)
Provider at bedside

## 2021-10-01 NOTE — ED Provider Notes (Signed)
Care assumed from [facility], [place]. See her note for full H&P.   Per her note, " [name] is a 75 y.o. female medical history of, bilateral knee and left hip replacements, and multiple back procedures presenting today after a fall at home.  Patient was attempting to get dressed with her daughter, tripped on the dress and fell backwards, hitting her head on a wood bench.  Subsequently patient rolled and fell with her left side of her body.  Denies LOC.  Denies visual changes or NV.  She is on Eliquis for recurrent DVT.  No history of arrhythmia, seizure or syncope.  Currently she is complaining of headache, neck pain, left hip and left knee pain."  Physical Exam  BP (!) 150/86    Pulse 71    Temp 97.8 F (36.6 C) (Oral)    Resp 19    Ht 5\' 3"  (1.6 m)    Wt 117.9 kg    LMP  (LMP Unknown)    SpO2 97%    BMI 46.06 kg/m   Physical Exam Vitals and nursing note reviewed.  Constitutional:      General: She is not in acute distress.    Appearance: She is well-developed.  HENT:     Head: Normocephalic and atraumatic.  Eyes:     Conjunctiva/sclera: Conjunctivae normal.  Cardiovascular:     Rate and Rhythm: Normal rate.  Pulmonary:     Effort: Pulmonary effort is normal.  Musculoskeletal:        General: Normal range of motion.     Cervical back: Neck supple.  Skin:    General: Skin is warm and dry.  Neurological:     Mental Status: She is alert.     Comments: Decreased strength to the RLE compared to the LLE. She reports that this is chronic. She reports sensation is intact bilat    ED Course/Procedures     Procedures Results for orders placed or performed during the hospital encounter of 05/24/21  CBG monitoring, ED  Result Value Ref Range   Glucose-Capillary 109 (H) 70 - 99 mg/dL   CT Head Wo Contrast  Result Date: 10/01/2021 CLINICAL DATA:  Head trauma, moderate/severe. Neck trauma, dangerous injury mechanism. Additional history provided: Fall (hitting back of head).  Patient reports neck pain and midline thoracic pain. EXAM: CT HEAD WITHOUT CONTRAST CT CERVICAL SPINE WITHOUT CONTRAST TECHNIQUE: Multidetector CT imaging of the head and cervical spine was performed following the standard protocol without intravenous contrast. Multiplanar CT image reconstructions of the cervical spine were also generated. COMPARISON:  Head CT 05/24/2021. Cervical CT myelogram 06/17/2021. CT cervical spine 05/24/2021. FINDINGS: CT HEAD FINDINGS Brain: Mildly motion degraded exam. Mild generalized cerebral atrophy. Partially empty sella turcica. There is no acute intracranial hemorrhage. No demarcated cortical infarct. No extra-axial fluid collection. No evidence of an intracranial mass. No midline shift. Vascular: No hyperdense vessel. Atherosclerotic calcifications. Skull: Normal. Negative for fracture or focal lesion. Sinuses/Orbits: Visualized orbits show no acute finding. Small fluid level within the right sphenoid sinus. CT CERVICAL SPINE FINDINGS Mildly motion degraded exam. Alignment: Reversal of the expected cervical lordosis. 2 mm C2-C3 grade 1 retrolisthesis. Trace C6-C7 grade 1 retrolisthesis, unchanged. Skull base and vertebrae: The basion-dental and atlanto-dental intervals are maintained.No evidence of acute fracture to the cervical spine. Sequela of prior fusion at the C3-C5 levels. Ventral plate and screws at C3-C4. Interbody devices at C3-C4 and C4-C5. Facet joint ankylosis bilaterally at the C3-C4 and C4-C5 levels. Soft tissues  and spinal canal: No prevertebral fluid or swelling. No visible canal hematoma. Disc levels: Cervical spondylosis with multilevel disc space narrowing, disc bulges, posterior disc osteophytes, endplate spurring, uncovertebral hypertrophy and facet arthrosis. Redemonstrated small to moderate sized central disc protrusion at C2-C3. Disc space narrowing is greatest at C5-C6 and C6-C7 (advanced at these levels). Multilevel spinal canal stenosis. Most notably,  there is apparent moderate spinal canal stenosis at C2-C3. Multilevel bony neural foraminal narrowing. Upper chest: No consolidation within the imaged lung apices. No visible pneumothorax. IMPRESSION: CT head: 1. Mildly motion degraded exam. 2. No evidence of acute intracranial abnormality. 3. Mild generalized cerebral atrophy. 4. Right sphenoid sinusitis. CT cervical spine: 1. Mildly motion degraded exam. 2. Reversal of the expected cervical lordosis. 3. 2 mm C2-C3 grade 1 retrolisthesis, unchanged. 4. Trace C6-C7 grade 1 retrolisthesis, unchanged. 5. Sequela of prior fusion at the C3-C5 levels, as described. 6. Cervical spondylosis, as outlined. Notably, a small to moderate sized central disc protrusion contributes to apparent moderate spinal canal stenosis at C2-C3. Multilevel bony neural foraminal narrowing. Electronically Signed   By: Jackey LogeKyle  Golden D.O.   On: 10/01/2021 15:23   CT Cervical Spine Wo Contrast  Result Date: 10/01/2021 CLINICAL DATA:  Head trauma, moderate/severe. Neck trauma, dangerous injury mechanism. Additional history provided: Fall (hitting back of head). Patient reports neck pain and midline thoracic pain. EXAM: CT HEAD WITHOUT CONTRAST CT CERVICAL SPINE WITHOUT CONTRAST TECHNIQUE: Multidetector CT imaging of the head and cervical spine was performed following the standard protocol without intravenous contrast. Multiplanar CT image reconstructions of the cervical spine were also generated. COMPARISON:  Head CT 05/24/2021. Cervical CT myelogram 06/17/2021. CT cervical spine 05/24/2021. FINDINGS: CT HEAD FINDINGS Brain: Mildly motion degraded exam. Mild generalized cerebral atrophy. Partially empty sella turcica. There is no acute intracranial hemorrhage. No demarcated cortical infarct. No extra-axial fluid collection. No evidence of an intracranial mass. No midline shift. Vascular: No hyperdense vessel. Atherosclerotic calcifications. Skull: Normal. Negative for fracture or focal lesion.  Sinuses/Orbits: Visualized orbits show no acute finding. Small fluid level within the right sphenoid sinus. CT CERVICAL SPINE FINDINGS Mildly motion degraded exam. Alignment: Reversal of the expected cervical lordosis. 2 mm C2-C3 grade 1 retrolisthesis. Trace C6-C7 grade 1 retrolisthesis, unchanged. Skull base and vertebrae: The basion-dental and atlanto-dental intervals are maintained.No evidence of acute fracture to the cervical spine. Sequela of prior fusion at the C3-C5 levels. Ventral plate and screws at C3-C4. Interbody devices at C3-C4 and C4-C5. Facet joint ankylosis bilaterally at the C3-C4 and C4-C5 levels. Soft tissues and spinal canal: No prevertebral fluid or swelling. No visible canal hematoma. Disc levels: Cervical spondylosis with multilevel disc space narrowing, disc bulges, posterior disc osteophytes, endplate spurring, uncovertebral hypertrophy and facet arthrosis. Redemonstrated small to moderate sized central disc protrusion at C2-C3. Disc space narrowing is greatest at C5-C6 and C6-C7 (advanced at these levels). Multilevel spinal canal stenosis. Most notably, there is apparent moderate spinal canal stenosis at C2-C3. Multilevel bony neural foraminal narrowing. Upper chest: No consolidation within the imaged lung apices. No visible pneumothorax. IMPRESSION: CT head: 1. Mildly motion degraded exam. 2. No evidence of acute intracranial abnormality. 3. Mild generalized cerebral atrophy. 4. Right sphenoid sinusitis. CT cervical spine: 1. Mildly motion degraded exam. 2. Reversal of the expected cervical lordosis. 3. 2 mm C2-C3 grade 1 retrolisthesis, unchanged. 4. Trace C6-C7 grade 1 retrolisthesis, unchanged. 5. Sequela of prior fusion at the C3-C5 levels, as described. 6. Cervical spondylosis, as outlined. Notably, a small to moderate sized central  disc protrusion contributes to apparent moderate spinal canal stenosis at C2-C3. Multilevel bony neural foraminal narrowing. Electronically Signed   By:  Kellie Simmering D.O.   On: 10/01/2021 15:23   CT Thoracic Spine Wo Contrast  Result Date: 10/01/2021 CLINICAL DATA:  Fall. EXAM: CT THORACIC AND LUMBAR SPINE WITHOUT CONTRAST TECHNIQUE: Multidetector CT imaging of the thoracic and lumbar spine was performed without contrast. Multiplanar CT image reconstructions were also generated. COMPARISON:  CT total myelogram dated June 17, 2021. FINDINGS: CT THORACIC SPINE FINDINGS Alignment: No significant listhesis. Vertebrae: Distraction type fracture through the T7-T8 anterior endplate osteophyte with widening of the disc space anteriorly (series 7, image 51). New asymmetric widening of the left facet joint (series 7, image 60). Chronic right-sided laminotomy/foraminotomy. No additional fracture. Prior T7 and T10 vertebroplasties with unchanged ventral epidural cement at T10 more than T7. Prior thoracolumbar posterior fusion. Paraspinal and other soft tissues: Small amount of paravertebral hematoma at T7-T8. Mild cardiomegaly. Trace right pleural effusion. Disc levels: Similar mild multilevel degenerative disc disease and moderate facet arthropathy of the upper and midthoracic spine. CT LUMBAR SPINE FINDINGS Segmentation: Unchanged lumbarization of S1. Alignment: Unchanged mild dextroscoliosis.  No listhesis. Vertebrae: No acute fracture or other focal pathologic process. Paraspinal and other soft tissues: IVC filter. Aortoiliac atherosclerotic vascular calcification. Disc levels: Solid arthrodesis throughout the lumbar spine. IMPRESSION: 1. Hyperextension injury at T7-T8 with distraction type fracture through an anterior endplate osteophyte and the disc space with widening of the disc space anteriorly and asymmetric widening of the left facet joint. 2. No acute lumbar spine fracture. 3. Solid arthrodesis from T9 to the sacrum. 4. Aortic Atherosclerosis (ICD10-I70.0). Electronically Signed   By: Titus Dubin M.D.   On: 10/01/2021 17:07   CT Lumbar Spine Wo  Contrast  Result Date: 10/01/2021 CLINICAL DATA:  Fall. EXAM: CT THORACIC AND LUMBAR SPINE WITHOUT CONTRAST TECHNIQUE: Multidetector CT imaging of the thoracic and lumbar spine was performed without contrast. Multiplanar CT image reconstructions were also generated. COMPARISON:  CT total myelogram dated June 17, 2021. FINDINGS: CT THORACIC SPINE FINDINGS Alignment: No significant listhesis. Vertebrae: Distraction type fracture through the T7-T8 anterior endplate osteophyte with widening of the disc space anteriorly (series 7, image 51). New asymmetric widening of the left facet joint (series 7, image 60). Chronic right-sided laminotomy/foraminotomy. No additional fracture. Prior T7 and T10 vertebroplasties with unchanged ventral epidural cement at T10 more than T7. Prior thoracolumbar posterior fusion. Paraspinal and other soft tissues: Small amount of paravertebral hematoma at T7-T8. Mild cardiomegaly. Trace right pleural effusion. Disc levels: Similar mild multilevel degenerative disc disease and moderate facet arthropathy of the upper and midthoracic spine. CT LUMBAR SPINE FINDINGS Segmentation: Unchanged lumbarization of S1. Alignment: Unchanged mild dextroscoliosis.  No listhesis. Vertebrae: No acute fracture or other focal pathologic process. Paraspinal and other soft tissues: IVC filter. Aortoiliac atherosclerotic vascular calcification. Disc levels: Solid arthrodesis throughout the lumbar spine. IMPRESSION: 1. Hyperextension injury at T7-T8 with distraction type fracture through an anterior endplate osteophyte and the disc space with widening of the disc space anteriorly and asymmetric widening of the left facet joint. 2. No acute lumbar spine fracture. 3. Solid arthrodesis from T9 to the sacrum. 4. Aortic Atherosclerosis (ICD10-I70.0). Electronically Signed   By: Titus Dubin M.D.   On: 10/01/2021 17:07   CT PELVIS WO CONTRAST  Result Date: 10/01/2021 CLINICAL DATA:  Fall. EXAM: CT PELVIS  WITHOUT CONTRAST TECHNIQUE: Multidetector CT imaging of the pelvis was performed following the standard protocol without intravenous contrast.  COMPARISON:  CT abdomen and pelvis dated December 15, 2006. FINDINGS: Urinary Tract:  No abnormality visualized. Bowel:  Unremarkable visualized pelvic bowel loops. Vascular/Lymphatic: Aortoiliac atherosclerotic vascular disease. Partially visualized IVC filter. No enlarged pelvic lymph nodes. Reproductive:  Hysterectomy.  No adnexal mass. Other:  None. Musculoskeletal: No acute fracture or dislocation. Prior left total hip arthroplasty. No evidence of hardware failure or loosening. IMPRESSION: 1. No acute osseous abnormality. 2. Prior left total hip arthroplasty without evidence of hardware failure or loosening. 3. Aortic Atherosclerosis (ICD10-I70.0). Electronically Signed   By: Obie Dredge M.D.   On: 10/01/2021 16:47   DG Chest Portable 1 View  Result Date: 10/01/2021 CLINICAL DATA:  Fall EXAM: PORTABLE CHEST 1 VIEW COMPARISON:  None. FINDINGS: The heart size and mediastinal contours are within normal limits. Atherosclerotic calcification of aortic arch. Both lungs are clear. No evidence of acute fracture. Status post right shoulder arthroplasty. Bibipedicle screws and rod fixation at the thoracolumbar junction. IMPRESSION: No acute cardiopulmonary process. Electronically Signed   By: Larose Hires D.O.   On: 10/01/2021 14:55   DG Knee Complete 4 Views Left  Result Date: 10/01/2021 CLINICAL DATA:  Fall EXAM: LEFT KNEE - COMPLETE 4+ VIEW COMPARISON:  None. FINDINGS: Left total knee arthroplasty is present. There is no periprosthetic fracture. No definite joint effusion. Well corticated ossific density superior to the patella. IMPRESSION: No acute fracture. Electronically Signed   By: Guadlupe Spanish M.D.   On: 10/01/2021 15:01   DG Hip Unilat W or Wo Pelvis 2-3 Views Left  Result Date: 10/01/2021 CLINICAL DATA:  Fall EXAM: DG HIP (WITH OR WITHOUT PELVIS)  2-3V LEFT COMPARISON:  None. FINDINGS: Status post left hip total arthroplasty. No displaced fracture or dislocation of the pelvis or bilateral proximal femurs, evaluation of the pelvis significantly limited by habitus and underpenetration. IMPRESSION: 1.  Status post left hip total arthroplasty. 2. No displaced fracture or dislocation of the pelvis or bilateral proximal femurs, evaluation of the pelvis significantly limited by habitus and underpenetration. Recommend CT if there is high clinical suspicion for fracture. Electronically Signed   By: Jearld Lesch M.D.   On: 10/01/2021 14:54     MDM   75 year old female presents for evaluation after mechanical fall that occurred in her home earlier today.  Complaining of mid to low back pain.  Imaging personally reviewed/interpreted and notable for - 1. Hyperextension injury at T7-T8 with distraction type fracture through an anterior endplate osteophyte and the disc space with widening of the disc space anteriorly and asymmetric widening of the left facet joint.   5:47 PM CONSULT with Docia Barrier, neurosurgery NP. He will review films and call back   7:10 PM Discussed case with NP Julien Girt. He states neurosurgery will admit patient. Recommends strict bed rest.      Rayne Du 10/01/21 1946    Benjiman Core, MD 10/02/21 0002

## 2021-10-01 NOTE — ED Triage Notes (Signed)
Fell backwards, hit back of head with hematoma noted, and midline thoracic, also c/o neck pain with c-collar placed, No LOC, on thinners since 2008, had 3 surgeries to back in a 7 week period 7/22.

## 2021-10-01 NOTE — ED Provider Notes (Signed)
[facility] EMERGENCY DEPARTMENT Provider Note   CSN: [id number] Arrival date & time: 10/01/21  1410     History Chief Complaint  Patient presents with   [name] is a 75 y.o. female medical history of, bilateral knee and left hip replacements, and multiple back procedures presenting today after a fall at home.  Patient was attempting to get dressed with her daughter, tripped on the dress and fell backwards, hitting her head on a wood bench.  Subsequently patient rolled and fell with her left side of her body.  Denies LOC.  Denies visual changes or NV.  She is on Eliquis for recurrent DVT.  No history of arrhythmia, seizure or syncope.  Currently she is complaining of headache, neck pain, left hip and left knee pain.   Past Medical History:  Diagnosis Date   Anemia    Arthritis    osteoarthritis   Claustrophobia    Complication of anesthesia    hard time waking up after her gallbladder surgery 28 years ago   DVT (deep venous thrombosis) (HCC)    left leg   GERD (gastroesophageal reflux disease)    hx of   Heart murmur    Dr. Tamala Julian follows, 'nothing to worry about.'   History of blood transfusion    History of hiatal hernia    small   History of kidney stones    Hypercholesteremia    under control   Hypertension    Hypothyroidism    Mild aortic stenosis    a. by echo 12/2017.   Panic attack    PONV (postoperative nausea and vomiting)    Pulmonary embolism (HCC)    bilateral lungs - 2008 after disc surgery    Patient Active Problem List   Diagnosis Date Noted   Thoracic spine pain 04/17/2021   Thoracic radiculopathy 04/17/2021   Compression fracture of body of thoracic vertebra ([place]) 03/03/2021   Coronary artery disease involving native coronary artery 12/12/2018   Chest pain 08/18/2018   Essential hypertension 08/18/2018   Rupture of quadriceps tendon 12/06/2016   Chronic kidney disease, stage II (mild) 07/27/2016   Hiatal  hernia 07/27/2016   Hypothyroidism 07/27/2016   Hyperlipidemia 07/27/2016   History of DVT (deep vein thrombosis) 07/27/2016   Recurrent pulmonary emboli ([place]) 07/27/2016   Chronic anticoagulation 07/27/2016   Chronic lower back pain 07/27/2016   Arthrosis of left acromioclavicular joint 03/31/2015   Biceps tendonitis on left 03/31/2015   Chronic right shoulder pain 03/31/2015   Left shoulder pain 03/31/2015   S/P left hip revision 12/07/2013   Visit for suture removal 09/25/2013   Aneurysm of unspecified site [facility]) 08/28/2013    Past Surgical History:  Procedure Laterality Date   ABDOMINAL HYSTERECTOMY  10/18/1973   partial   APPENDECTOMY  10/19/1971   BACK SURGERY     [id number]   CARDIAC CATHETERIZATION  ~2010   CARPAL TUNNEL RELEASE Bilateral    CHOLECYSTECTOMY  10/19/1995   COLONOSCOPY     COLONOSCOPY WITH PROPOFOL N/A 12/05/2017   Procedure: COLONOSCOPY WITH PROPOFOL;  Surgeon: Laurence Spates, MD;  Location: [facility];  Service: Endoscopy;  Laterality: N/A;   DILATION AND CURETTAGE OF UTERUS     ENDARTERECTOMY Right 08/10/2013   Procedure: Excision of Venous Aneurysm Right Neck;  Surgeon: Rosetta Posner, MD;  Location: [facility] OR;  Service: Vascular;  Laterality: Right;   ESOPHAGOGASTRODUODENOSCOPY (EGD) WITH PROPOFOL N/A 12/05/2017   Procedure: ESOPHAGOGASTRODUODENOSCOPY (EGD) WITH  PROPOFOL;  Surgeon: Laurence Spates, MD;  Location: [facility];  Service: Endoscopy;  Laterality: N/A;   filter Left 10/19/2007   IVC   FRACTURE SURGERY Left    left wrist   HIP SURGERY Left 10/18/2006   JOINT REPLACEMENT Right 11/19/2003   (knee)multiple surgeries   JOINT REPLACEMENT Left 12/16/2009   (knee)   JOINT REPLACEMENT Left 2011, 2012   Hip   KYPHOPLASTY N/A 03/03/2021   Procedure: Thoracic seven Kyphoplasty;  Surgeon: Kristeen Miss, MD;  Location: [facility];  Service: Neurosurgery;  Laterality: N/A;   KYPHOPLASTY N/A 03/31/2021   Procedure: Thoracic seven Kyphoplasty;  Surgeon:  Kristeen Miss, MD;  Location: [facility];  Service: Neurosurgery;  Laterality: N/A;   LUMBAR LAMINECTOMY/ DECOMPRESSION WITH MET-RX Right 04/17/2021   Procedure: Right Thoracic Seven-Eight  Extraforaminal decompression/foraminotomy;  Surgeon: Kristeen Miss, MD;  Location: [facility];  Service: Neurosurgery;  Laterality: Right;   MASS EXCISION  10/18/1972   uterus   QUADRICEPS TENDON REPAIR Right 12/06/2016   Procedure: QUAD TENDON RECONSTRUCTION;  Surgeon: Paralee Cancel, MD;  Location: [facility];  Service: Orthopedics;  Laterality: Right;  Requests for 90 mins   RADIOLOGY WITH ANESTHESIA N/A 01/20/2021   Procedure: MRI WITH [facility] PAIN WITHOUT CONTRAST;  Surgeon: Radiologist, Medication, MD;  Location: [facility];  Service: Radiology;  Laterality: N/A;   TONSILLECTOMY  10/18/1984   TOTAL HIP REVISION Left 12/07/2013   Procedure:  REVISION  CONSTRAINED LINER LEFT TOTAL HIP ;  Surgeon: Mauri Pole, MD;  Location: [facility];  Service: Orthopedics;  Laterality: Left;   TOTAL SHOULDER REPLACEMENT Right 11/30/2010   TUBAL LIGATION  10/19/1971     OB History   No obstetric history on file.     Family History  Problem Relation Age of Onset   Diabetes Mother    Heart disease Mother        before age 9 - ?thick blood - pt does not know if patient had formal heart issue but heart stopped   Hyperlipidemia Mother    Hypertension Mother    Diabetes Father    Hyperlipidemia Father    Hypertension Father    Heart attack Father        in his 75s during adm for flu   Peripheral vascular disease Father    Other Father        amputation   Diabetes Sister    Hyperlipidemia Sister    Hypertension Sister    Deep vein thrombosis Brother    Hyperlipidemia Brother    Hypertension Brother     Social History   Tobacco Use   Smoking status: Never   Smokeless tobacco: Never  Vaping Use   Vaping Use: Never used  Substance Use Topics   Alcohol use: No   Drug use: No    Home Medications Prior to Admission  medications   Medication Sig Start Date End Date Taking? Authorizing Provider  ALPRAZolam Duanne Moron) 0.5 MG tablet Take 0.5 mg by mouth 2 (two) times daily. 07/17/13   [provider]  amLODipine (NORVASC) 5 MG tablet Take 2.5 mg by mouth at bedtime. 01/24/20   [provider]  Ascorbic Acid (VITAMIN C) 250 MG CHEW Chew 500 mg by mouth daily.    [provider]  atenolol (TENORMIN) 25 MG tablet Take 25 mg by mouth See admin instructions. Take one tablet (25 mg) by mouth every morning, may also take a 2nd tablet (25 mg) if SBP >150 06/11/13   [provider]  Bacillus Coagulans-Inulin (PROBIOTIC-PREBIOTIC PO) Take 1 tablet by mouth in the morning.    [provider]  baclofen (LIORESAL) 10 MG tablet Take 1 tablet (10 mg total) by mouth every 8 (eight) hours. 12/07/13   Danae Orleans, PA-C  Calcium Carb-Cholecalciferol (CALCIUM 500 + D PO) Take 1 tablet by mouth 2 (two) times daily.    [provider]  escitalopram (LEXAPRO) 5 MG tablet Take 5 mg by mouth at bedtime. 01/22/20   [provider]  esomeprazole (NEXIUM) 40 MG capsule Take 40 mg by mouth in the morning. 04/13/21   [provider]  famotidine (PEPCID) 40 MG tablet Take 40 mg by mouth at bedtime. 08/22/18   [provider]  furosemide (LASIX) 20 MG tablet Take 20 mg by mouth every Monday, Tuesday, Wednesday, Thursday, and Friday. Skip Saturday and Sunday 05/28/13   [provider]  gabapentin (NEURONTIN) 600 MG tablet Take 600 mg by mouth 3 (three) times daily. 05/25/13   [provider]  Histamine Dihydrochloride (AUSTRALIAN DREAM ARTHRITIS) 0.025 % CREA Apply 1 application topically 4 (four) times daily as needed (spasms/joint pain.).    [provider]  HYDROcodone-acetaminophen (NORCO/VICODIN) 5-325 MG tablet Take 1-2 tablets by mouth every 4 (four) hours as needed for moderate pain. Patient taking differently: Take 1 tablet by mouth every 6  (six) hours as needed for moderate pain. 04/17/21   Kristeen Miss, MD  levothyroxine (SYNTHROID, LEVOTHROID) 50 MCG tablet Take 25 mcg by mouth every Monday, Wednesday, and Friday. Skip Sunday, Tuesday, Thursday, Saturday 08/09/18   [provider]  losartan (COZAAR) 50 MG tablet Take 50 mg by mouth 2 (two) times daily. 12/13/19   [provider]  Multiple Vitamins-Minerals (MULTI + OMEGA-3 ADULT GUMMIES PO) Take 2 tablets by mouth in the morning.    [provider]  ondansetron (ZOFRAN-ODT) 4 MG disintegrating tablet Take 4 mg by mouth 2 (two) times daily as needed for nausea or vomiting. 06/07/18   [provider]  Polyethyl Glycol-Propyl Glycol (SYSTANE OP) Place 1-2 drops into both eyes 2 (two) times daily as needed (for dry eyes).    [provider]  Rivaroxaban (XARELTO) 20 MG TABS tablet Take 20 mg by mouth daily at 4 PM. 08/11/13   Early, Arvilla Meres, MD  simvastatin (ZOCOR) 20 MG tablet Take 20 mg by mouth at bedtime. 05/28/13   [provider]    Allergies    Celecoxib  Review of Systems   Review of Systems  Eyes:  Negative for visual disturbance.  Respiratory:  Negative for shortness of breath.   Musculoskeletal:  Positive for arthralgias, back pain and neck pain.  Skin:  Negative for wound.  Neurological:  Negative for headaches.  All other systems reviewed and are negative.  Physical Exam Updated Vital Signs BP (!) 175/51    Pulse (!) 57    Temp 97.8 F (36.6 C) (Oral)    Resp (!) 22    Ht _0  (1.6 m)    Wt 117.9 kg    LMP  (LMP Unknown)    SpO2 99%    BMI 46.06 kg/m   Physical Exam Vitals and nursing note reviewed.  Constitutional:      General: She is not in acute distress.    Appearance: Normal appearance. She is obese. She is not ill-appearing.  HENT:     Head: Normocephalic.     Comments: Small hematoma to posterior skull. Eyes:     General:  No scleral icterus.    Conjunctiva/sclera: Conjunctivae normal.      Pupils: Pupils are equal, round, and reactive to light.  Cardiovascular:     Rate and Rhythm: Normal rate and regular rhythm.  Pulmonary:     Effort: Pulmonary effort is normal. No respiratory distress.     Breath sounds: No wheezing.  Abdominal:     General: Abdomen is flat.     Tenderness: There is no abdominal tenderness.  Musculoskeletal:     Cervical back: Tenderness (upper cervical) present.  Skin:    General: Skin is warm and dry.     Findings: No bruising or rash.  Neurological:     Mental Status: She is alert.     Comments: Moving all 4 extremities.  5 out of 5 strength in all.  Psychiatric:        Mood and Affect: Mood normal.        Behavior: Behavior normal.    ED Results / Procedures / Treatments   Labs (all labs ordered are listed, but only abnormal results are displayed) Labs Reviewed - No data to display  EKG EKG Interpretation  Date/Time:  Thursday October 01 2021 14:20:53 EST Ventricular Rate:  65 PR Interval:  61 QRS Duration: 95 QT Interval:  402 QTC Calculation: 418 R Axis:   -8 Text Interpretation: Sinus rhythm Short PR interval Low voltage, precordial leads Abnormal R-wave progression, early transition Artifact Otherwise no significant change Confirmed by Fredia Sorrow [phone]) on 10/01/2021 2:29:18 PM  Radiology DG Chest Portable 1 View  Result Date: 10/01/2021 CLINICAL DATA:  Fall EXAM: PORTABLE CHEST 1 VIEW COMPARISON:  None. FINDINGS: The heart size and mediastinal contours are within normal limits. Atherosclerotic calcification of aortic arch. Both lungs are clear. No evidence of acute fracture. Status post right shoulder arthroplasty. Bibipedicle screws and rod fixation at the thoracolumbar junction. IMPRESSION: No acute cardiopulmonary process. Electronically Signed   By: Keane Police D.O.   On: 10/01/2021 14:55   DG Knee Complete 4 Views Left  Result Date: 10/01/2021 CLINICAL DATA:  Fall EXAM: LEFT KNEE - COMPLETE 4+ VIEW COMPARISON:   None. FINDINGS: Left total knee arthroplasty is present. There is no periprosthetic fracture. No definite joint effusion. Well corticated ossific density superior to the patella. IMPRESSION: No acute fracture. Electronically Signed   By: Macy Mis M.D.   On: 10/01/2021 15:01   DG Hip Unilat W or Wo Pelvis 2-3 Views Left  Result Date: 10/01/2021 CLINICAL DATA:  Fall EXAM: DG HIP (WITH OR WITHOUT PELVIS) 2-3V LEFT COMPARISON:  None. FINDINGS: Status post left hip total arthroplasty. No displaced fracture or dislocation of the pelvis or bilateral proximal femurs, evaluation of the pelvis significantly limited by habitus and underpenetration. IMPRESSION: 1.  Status post left hip total arthroplasty. 2. No displaced fracture or dislocation of the pelvis or bilateral proximal femurs, evaluation of the pelvis significantly limited by habitus and underpenetration. Recommend CT if there is high clinical suspicion for fracture. Electronically Signed   By: Delanna Ahmadi M.D.   On: 10/01/2021 14:54    Procedures Procedures   Medications Ordered in ED Medications  HYDROmorphone (DILAUDID) injection 1 mg (has no administration in time range)  fentaNYL (SUBLIMAZE) injection 25 mcg (25 mcg Intravenous Given 10/01/21 1510)    ED Course  I have reviewed the triage vital signs and the nursing notes.  Pertinent labs & imaging results that were available during my care of the patient were reviewed by me and considered  in my medical decision making (see chart for details).    MDM Rules/Calculators/A&P Patient evaluated by me.  Came in as a level 2 trauma due to falling on blood thinners.  History suggests a purely mechanical fall.  Hematoma noted to the back of her head.  CT of the head and neck negative.  X-ray of left hip and pelvis as well as left knee negative.  Chest x-ray also negative.  Patient's nurse stated that she is now complaining of lower back pain.  Fentanyl did not help her very much.  Will  image lower spine as well as CT pelvis for any occult fracture missed on x-ray.  1 of Dilaudid given.  At this time patient has been signed out to oncoming PA [employer]. See her note for results.  Final Clinical Impression(s) / ED Diagnoses Final diagnoses:  Fall, initial encounter    Rx / DC Orders Signed out to PA [employer] at shift change, see her note for further work-up and dispo.    Darliss Ridgel 10/01/21 1548    Fredia Sorrow, MD 10/07/21 1312

## 2021-10-01 NOTE — ED Notes (Addendum)
Ortho techs at bedside. Neuro surgery at bedside.

## 2021-10-02 ENCOUNTER — Encounter (HOSPITAL_COMMUNITY)
Admission: EM | Disposition: A | Discharge status: Home Health | Payer: Self-pay | Source: Home / Self Care | Attending: Neurological Surgery

## 2021-10-02 ENCOUNTER — Inpatient Hospital Stay (HOSPITAL_COMMUNITY): Payer: PPO | Admitting: Certified Registered"

## 2021-10-02 ENCOUNTER — Inpatient Hospital Stay (HOSPITAL_COMMUNITY): Payer: PPO

## 2021-10-02 HISTORY — PX: RADIOLOGY WITH ANESTHESIA: SHX6223

## 2021-10-02 LAB — APTT
aPTT: 65 seconds — ABNORMAL HIGH (ref 24–36)
aPTT: 71 seconds — ABNORMAL HIGH (ref 24–36)

## 2021-10-02 LAB — HEPARIN LEVEL (UNFRACTIONATED): Heparin Unfractionated: >1.1 IU/mL — ABNORMAL HIGH (ref 0.30–0.70)

## 2021-10-02 SURGERY — MRI WITH ANESTHESIA
Anesthesia: General

## 2021-10-02 MED ORDER — PROPOFOL 10 MG/ML IV BOLUS
INTRAVENOUS | Status: DC | PRN
  Administered 2021-10-02: 100 mg via INTRAVENOUS

## 2021-10-02 MED ORDER — CHLORHEXIDINE GLUCONATE CLOTH 2 % EX PADS
6.0000 | MEDICATED_PAD | Freq: Every day | CUTANEOUS | Status: DC
  Administered 2021-10-03 – 2021-10-09 (×7): 6 via TOPICAL

## 2021-10-02 MED ORDER — ONDANSETRON HCL 4 MG/2ML IJ SOLN
INTRAMUSCULAR | Status: DC | PRN
  Administered 2021-10-02: 4 mg via INTRAVENOUS

## 2021-10-02 MED ORDER — DEXAMETHASONE SODIUM PHOSPHATE 10 MG/ML IJ SOLN
INTRAMUSCULAR | Status: DC | PRN
  Administered 2021-10-02: 4 mg via INTRAVENOUS

## 2021-10-02 MED ORDER — LACTATED RINGERS IV SOLN
INTRAVENOUS | Status: DC | PRN
Start: 2021-10-02 — End: 2021-10-02

## 2021-10-02 MED ORDER — MIDAZOLAM HCL 2 MG/2ML IJ SOLN
INTRAMUSCULAR | Status: DC | PRN
  Administered 2021-10-02: 2 mg via INTRAVENOUS

## 2021-10-02 MED ORDER — LIDOCAINE 2% (20 MG/ML) 5 ML SYRINGE
INTRAMUSCULAR | Status: DC | PRN
  Administered 2021-10-02: 60 mg via INTRAVENOUS

## 2021-10-02 MED ORDER — SUGAMMADEX SODIUM 200 MG/2ML IV SOLN
INTRAVENOUS | Status: DC | PRN
  Administered 2021-10-02: 200 mg via INTRAVENOUS

## 2021-10-02 MED ORDER — EPHEDRINE SULFATE-NACL 50-0.9 MG/10ML-% IV SOSY
PREFILLED_SYRINGE | INTRAVENOUS | Status: DC | PRN
  Administered 2021-10-02 (×2): 10 mg via INTRAVENOUS

## 2021-10-02 MED ORDER — ROCURONIUM BROMIDE 10 MG/ML (PF) SYRINGE
PREFILLED_SYRINGE | INTRAVENOUS | Status: DC | PRN
  Administered 2021-10-02: 50 mg via INTRAVENOUS

## 2021-10-02 MED ORDER — PANTOPRAZOLE SODIUM 40 MG PO TBEC
80.0000 mg | DELAYED_RELEASE_TABLET | Freq: Every day | ORAL | Status: DC
  Administered 2021-10-02: 80 mg via ORAL
  Filled 2021-10-02 (×2): qty 2

## 2021-10-02 MED ORDER — FENTANYL CITRATE (PF) 250 MCG/5ML IJ SOLN
INTRAMUSCULAR | Status: DC | PRN
  Administered 2021-10-02 (×2): 50 ug via INTRAVENOUS

## 2021-10-02 MED ORDER — PHENYLEPHRINE HCL-NACL 20-0.9 MG/250ML-% IV SOLN
INTRAVENOUS | Status: DC | PRN
  Administered 2021-10-02: 50 ug/min via INTRAVENOUS

## 2021-10-02 MED ORDER — PHENYLEPHRINE 40 MCG/ML (10ML) SYRINGE FOR IV PUSH (FOR BLOOD PRESSURE SUPPORT)
PREFILLED_SYRINGE | INTRAVENOUS | Status: DC | PRN
  Administered 2021-10-02: 120 ug via INTRAVENOUS
  Administered 2021-10-02: 160 ug via INTRAVENOUS

## 2021-10-02 NOTE — Anesthesia Postprocedure Evaluation (Signed)
Anesthesia Post Note  Patient: [name]  Procedure(s) Performed: MRI WITH ANESTHESIA     Patient location during evaluation: PACU Anesthesia Type: General Level of consciousness: awake and alert Pain management: pain level controlled Vital Signs Assessment: post-procedure vital signs reviewed and stable Respiratory status: spontaneous breathing, nonlabored ventilation and respiratory function stable Cardiovascular status: blood pressure returned to baseline and stable Postop Assessment: no apparent nausea or vomiting Anesthetic complications: no   No notable events documented.  Last Vitals:  Vitals:   10/02/21 1000 10/02/21 1019  BP: (!) 147/57 130/66  Pulse: 83 83  Resp: (!) 9 15  Temp: 36.7 C 36.4 C  SpO2: 94% 96%    Last Pain:  Vitals:   10/02/21 1019  TempSrc: Oral  PainSc: [address] [place]

## 2021-10-02 NOTE — Progress Notes (Signed)
Patient ID: [name], female   DOB: 1945-12-05, 75 y.o.   MRN: [id number] I have reviewed the history and physical of Dr. Jake Samples.  I have known [name] for a long time and of perform numerous procedures on her spine both in the cervical spine and most recently in the thoracic spine for thoracic compression fracture at T7 level.  She now has a fracture dislocation at the T7-T8 level with what appears to be a 3 column injury.  There is minimal spinal stenosis secondary to edema and contusion in the ligaments.  This will however require surgical stabilization.  It does appear that her posterior longitudinal ligament remains intact.  On examination today I note that she has good proximal strength albeit considerable pain when lifting her legs the pain is mostly in her thoracic spine.  She notes that she is very uncomfortable with any movement in bed turning side to side or otherwise trying to be comfortable.  Her distal lower extremity function appears intact in the tibialis anterior and gastrocs.  I have advised posterior stabilization from T6-T9 with pedicle screw fixation and rod fixation across those levels in a neutral construct.  Posterior arthrodesis will be performed with allograft.  This should help stabilize the fractured fragment.  I do not believe a formal decompression needs to be considered in the situation.  We will plan on scheduling the surgery for Monday.  She will be in the hospital at bedrest and receiving heparin as she has been on Xarelto and this has been stopped since her admission.

## 2021-10-02 NOTE — Progress Notes (Addendum)
Pt. Arrived to [facility]NP06 from ED with one bag of belongings, CHG bath completed, sacral foam placed, A&O x4, complaining of 10/10 pain in back, given 1mg   Dilaudid IV, educated on how to use call bell, left with call bell, hospital phone and personal phone in reach

## 2021-10-02 NOTE — Anesthesia Preprocedure Evaluation (Addendum)
Anesthesia Evaluation  Patient identified by MRN, date of birth, ID band Patient awake    Reviewed: Allergy & Precautions, NPO status , Patient's Chart, lab work & pertinent test results  History of Anesthesia Complications Negative for: history of anesthetic complications  Airway Mallampati: II  TM Distance: >3 FB Neck ROM: Full    Dental  (+) Dental Advisory Given   Pulmonary PE   Pulmonary exam normal        Cardiovascular hypertension, + CAD (mild by coronary CT) and + DVT  Normal cardiovascular exam+ Valvular Problems/Murmurs (mild) AS    Echo 2019: EF 55-60%, mild AS (MG 11) Normal stress test 2017 Cleared earlier this year by cardiology for surgery   Neuro/Psych s/p mechanical fall with thoracic spine injury (T7-8 three column fx)- strict logroll precautions, currently at neurologic baseline    GI/Hepatic Neg liver ROS, GERD  ,  Endo/Other  Hypothyroidism Morbid obesity  Renal/GU CRFRenal disease (Cr 1.40)  negative genitourinary   Musculoskeletal  (+) Arthritis ,   Abdominal   Peds  Hematology  (+) anemia , Xarelto Hgb 11.1   Anesthesia Other Findings   Reproductive/Obstetrics                            Anesthesia Physical Anesthesia Plan  ASA: 3  Anesthesia Plan: General   Post-op Pain Management: Minimal or no pain anticipated   Induction: Intravenous  PONV Risk Score and Plan: 3 and Ondansetron, Dexamethasone, Midazolam and Treatment may vary due to age or medical condition  Airway Management Planned: LMA  Additional Equipment: None  Intra-op Plan:   Post-operative Plan: Extubation in OR  Informed Consent: I have reviewed the patients History and Physical, chart, labs and discussed the procedure including the risks, benefits and alternatives for the proposed anesthesia with the patient or authorized representative who has indicated his/her understanding and  acceptance.     Dental advisory given  Plan Discussed with:   Anesthesia Plan Comments:        Anesthesia Quick Evaluation

## 2021-10-02 NOTE — Anesthesia Procedure Notes (Addendum)
Procedure Name: Intubation Date/Time: 10/02/2021 8:16 AM Performed by: Leonor Liv, CRNA Pre-anesthesia Checklist: Patient identified, Emergency Drugs available, Suction available and Patient being monitored Patient Re-evaluated:Patient Re-evaluated prior to induction Oxygen Delivery Method: Circle System Utilized Preoxygenation: Pre-oxygenation with 100% oxygen Induction Type: IV induction Ventilation: Mask ventilation without difficulty Laryngoscope Size: Mac and 3 Grade View: Grade I Tube type: Oral Tube size: 7.0 mm Number of attempts: 1 Airway Equipment and Method: Stylet and Oral airway Placement Confirmation: ETT inserted through vocal cords under direct vision, positive ETCO2 and breath sounds checked- equal and bilateral Secured at: 21 cm Tube secured with: Tape Dental Injury: Teeth and Oropharynx as per pre-operative assessment

## 2021-10-02 NOTE — Transfer of Care (Signed)
Immediate Anesthesia Transfer of Care Note  Patient: [name]  Procedure(s) Performed: MRI WITH ANESTHESIA  Patient Location: PACU  Anesthesia Type:General  Level of Consciousness: awake, alert  and oriented  Airway & Oxygen Therapy: Patient Spontanous Breathing and Patient connected to nasal cannula oxygen  Post-op Assessment: Report given to RN, Post -op Vital signs reviewed and stable and Patient moving all extremities  Post vital signs: Reviewed and stable  Last Vitals:  Vitals Value Taken Time  BP 149/60 10/02/21 0930  Temp 36.7 C 10/02/21 0927  Pulse 85 10/02/21 0942  Resp 8 10/02/21 0942  SpO2 92 % 10/02/21 0942  Vitals shown include unvalidated device data.  Last Pain:  Vitals:   10/02/21 0715  TempSrc:   PainSc: 0-No pain      Patients Stated Pain Goal: 2 (10/02/21 0510)  Complications: No notable events documented.

## 2021-10-02 NOTE — Progress Notes (Signed)
ANTICOAGULATION CONSULT NOTE  Pharmacy Consult for Heparin Indication: Hx of VTE  Allergies  Allergen Reactions   Celecoxib Rash   Patient Measurements: Height: 5\' 3"  (160 cm) Weight: 117.9 kg (260 lb) IBW/kg (Calculated) : 52.4 Heparin Dosing Weight: 81.2 kg  Vital Signs: Temp: 98.5 F (36.9 C) (12/16 0305) Temp Source: Oral (12/16 0305) BP: 123/56 (12/16 0305) Pulse Rate: 57 (12/16 0305)  Labs: Recent Labs    10/01/21 1934 10/01/21 1935 10/02/21 0353  HGB 11.1*  --   --   HCT 35.0*  --   --   PLT 201  --   --   APTT 34  --  65*  LABPROT 16.5*  --   --   INR 1.3*  --   --   HEPARINUNFRC  --  >1.10* >1.10*  CREATININE 1.40*  --   --     Estimated Creatinine Clearance: 43.1 mL/min (A) (by C-G formula based on SCr of 1.4 mg/dL (H)).   Medical History: Past Medical History:  Diagnosis Date   Anemia    Arthritis    osteoarthritis   Claustrophobia    Complication of anesthesia    hard time waking up after her gallbladder surgery 28 years ago   DVT (deep venous thrombosis) (HCC)    left leg   GERD (gastroesophageal reflux disease)    hx of   Heart murmur    Dr. 10/04/21 follows, 'nothing to worry about.'   History of blood transfusion    History of hiatal hernia    small   History of kidney stones    Hypercholesteremia    under control   Hypertension    Hypothyroidism    Mild aortic stenosis    a. by echo 12/2017.   Panic attack    PONV (postoperative nausea and vomiting)    Pulmonary embolism (HCC)    bilateral lungs - 2008 after disc surgery    Assessment: 75 yo female with a history of recurrent DVT. Patient is presenting after a fall at home. Heparin per pharmacy consult placed for Hx of VTE.  Patient is on Rivaroxaban 20mg  daily prior to arrival. Last dose 12/14 at 4PM. Will require aPTT monitoring due to likely falsely high anti-Xa level secondary to DOAC use. -aPTT= 65   Goal of Therapy:  Heparin level 0.3-0.7 units/ml aPTT 66-102  seconds Monitor platelets by anticoagulation protocol: Yes   Plan:  -Increase heparin to 1500 units/hr -Heparin level in 8 hrs  , PharmD Clinical Pharmacist **Pharmacist phone directory can now be found on [email or web address] ([email or web address]).  Listed under [facility].

## 2021-10-02 NOTE — Progress Notes (Signed)
°  Transition of Care [facility]) Screening Note   Patient Details  Name: [name] Date of Birth: [birth year]   Transition of Care [facility]) CM/SW Contact:    Darrold Span, RN Phone Number: 10/02/2021, 1:56 PM    Transition of Care Department [facility]) has reviewed patient and no TOC needs have been identified at this time. We will continue to monitor patient advancement through interdisciplinary progression rounds. If new patient transition needs arise, please place a TOC consult.

## 2021-10-02 NOTE — Progress Notes (Signed)
ANTICOAGULATION CONSULT NOTE  Pharmacy Consult for Heparin Indication: Hx of VTE  Allergies  Allergen Reactions   Celecoxib Rash   Patient Measurements: Height: 5\' 3"  (160 cm) Weight: 117.9 kg (260 lb) IBW/kg (Calculated) : 52.4 Heparin Dosing Weight: 81.2 kg  Vital Signs: Temp: 98.2 F (36.8 C) (12/16 1147) Temp Source: Oral (12/16 1147) BP: 130/63 (12/16 1251) Pulse Rate: 88 (12/16 1251)  Labs: Recent Labs    10/01/21 1934 10/01/21 1935 10/02/21 0353 10/02/21 1423  HGB 11.1*  --   --   --   HCT 35.0*  --   --   --   PLT 201  --   --   --   APTT 34  --  65* 71*  LABPROT 16.5*  --   --   --   INR 1.3*  --   --   --   HEPARINUNFRC  --  >1.10* >1.10*  --   CREATININE 1.40*  --   --   --      Estimated Creatinine Clearance: 43.1 mL/min (A) (by C-G formula based on SCr of 1.4 mg/dL (H)).   Medical History: Past Medical History:  Diagnosis Date   Anemia    Arthritis    osteoarthritis   Claustrophobia    Complication of anesthesia    hard time waking up after her gallbladder surgery 28 years ago   DVT (deep venous thrombosis) (HCC)    left leg   GERD (gastroesophageal reflux disease)    hx of   Heart murmur    Dr. 10/04/21 follows, 'nothing to worry about.'   History of blood transfusion    History of hiatal hernia    small   History of kidney stones    Hypercholesteremia    under control   Hypertension    Hypothyroidism    Mild aortic stenosis    a. by echo 12/2017.   Panic attack    PONV (postoperative nausea and vomiting)    Pulmonary embolism (HCC)    bilateral lungs - 2008 after disc surgery    Assessment: 75 yo female with a history of recurrent DVT. Patient is presenting after a fall at home. Heparin per pharmacy consult placed for Hx of VTE.  Patient is on Rivaroxaban 20mg  daily prior to arrival. Last dose 12/14 at 4PM. Will require aPTT monitoring due to likely falsely high anti-Xa level secondary to DOAC use.  After adjustment to 1500  units/hr, aPTT therapeutic at 71 seconds  Goal of Therapy:  Heparin level 0.3-0.7 units/ml aPTT 66-102 seconds Monitor platelets by anticoagulation protocol: Yes   Plan:  Continue heparin gtt at 1500 units/hr Daily aPTT/HL, CBC, s/s bleeding F/u post-op ability to transition back to PO  , PharmD Clinical Pharmacist ED Pharmacist Phone # [phone] 10/02/2021 3:38 PM

## 2021-10-02 NOTE — TOC CAGE-AID Note (Signed)
Transition of Care [facility]) - CAGE-AID Screening   Patient Details  Name: [name] MRN: [id number] Date of Birth: [birth year]  Transition of Care [facility]) CM/SW Contact:    Izayiah Tibbitts C Tarpley-Carter, LCSWA Phone Number: 10/02/2021, 10:37 AM   Clinical Narrative: Pt is unable to participate in Cage Aid.  Analilia Geddis Tarpley-Carter, MSW, LCSW-A Pronouns:  She/Her/[place] Transitions of Care Clinical Social Worker Direct Number:  [phone] [email or web address]  CAGE-AID Screening: Substance Abuse Screening unable to be completed due to: : Patient unable to participate             Substance Abuse Education Offered: No

## 2021-10-03 LAB — APTT
aPTT: 110 seconds — ABNORMAL HIGH (ref 24–36)
aPTT: 80 seconds — ABNORMAL HIGH (ref 24–36)

## 2021-10-03 LAB — CBC
HCT: 33.3 % — ABNORMAL LOW (ref 36.0–46.0)
Hemoglobin: 10.2 g/dL — ABNORMAL LOW (ref 12.0–15.0)
MCH: 28.6 pg (ref 26.0–34.0)
MCHC: 30.6 g/dL (ref 30.0–36.0)
MCV: 93.3 fL (ref 80.0–100.0)
Platelets: 171 10*3/uL (ref 150–400)
RBC: 3.57 MIL/uL — ABNORMAL LOW (ref 3.87–5.11)
RDW: 12.6 % (ref 11.5–15.5)
WBC: 7.6 10*3/uL (ref 4.0–10.5)
nRBC: 0 % (ref 0.0–0.2)

## 2021-10-03 LAB — HEPARIN LEVEL (UNFRACTIONATED): Heparin Unfractionated: 0.96 IU/mL — ABNORMAL HIGH (ref 0.30–0.70)

## 2021-10-03 MED ORDER — ALBUTEROL SULFATE (2.5 MG/3ML) 0.083% IN NEBU: 3.0000 mL | INHALATION_SOLUTION | Freq: Four times a day (QID) | RESPIRATORY_TRACT | Status: DC | PRN

## 2021-10-03 MED ORDER — AMLODIPINE BESYLATE 2.5 MG PO TABS
2.5000 mg | ORAL_TABLET | Freq: Every day | ORAL | Status: DC
  Administered 2021-10-03 – 2021-10-08 (×6): 2.5 mg via ORAL
  Filled 2021-10-03 (×6): qty 1

## 2021-10-03 MED ORDER — ATENOLOL 25 MG PO TABS
25.0000 mg | ORAL_TABLET | Freq: Every day | ORAL | Status: DC
  Administered 2021-10-03 – 2021-10-09 (×7): 25 mg via ORAL
  Filled 2021-10-03 (×7): qty 1

## 2021-10-03 MED ORDER — OYSTER SHELL CALCIUM/D3 500-5 MG-MCG PO TABS
1.0000 | ORAL_TABLET | Freq: Every morning | ORAL | Status: DC
  Administered 2021-10-03 – 2021-10-09 (×7): 1 via ORAL
  Filled 2021-10-03 (×7): qty 1

## 2021-10-03 MED ORDER — ONDANSETRON 4 MG PO TBDP
4.0000 mg | ORAL_TABLET | Freq: Two times a day (BID) | ORAL | Status: DC | PRN
  Administered 2021-10-03 – 2021-10-04 (×2): 4 mg via ORAL
  Filled 2021-10-03: qty 1

## 2021-10-03 MED ORDER — LEVOTHYROXINE SODIUM 25 MCG PO TABS
25.0000 ug | ORAL_TABLET | ORAL | Status: DC
  Administered 2021-10-05 – 2021-10-09 (×3): 25 ug via ORAL
  Filled 2021-10-03 (×3): qty 1

## 2021-10-03 MED ORDER — GABAPENTIN 600 MG PO TABS
600.0000 mg | ORAL_TABLET | Freq: Three times a day (TID) | ORAL | Status: DC
  Administered 2021-10-03 – 2021-10-09 (×18): 600 mg via ORAL
  Filled 2021-10-03 (×18): qty 1

## 2021-10-03 MED ORDER — BACLOFEN 10 MG PO TABS
10.0000 mg | ORAL_TABLET | Freq: Three times a day (TID) | ORAL | Status: DC
  Administered 2021-10-03 – 2021-10-09 (×18): 10 mg via ORAL
  Filled 2021-10-03 (×18): qty 1

## 2021-10-03 MED ORDER — LOSARTAN POTASSIUM 50 MG PO TABS
50.0000 mg | ORAL_TABLET | Freq: Two times a day (BID) | ORAL | Status: DC
  Administered 2021-10-03 – 2021-10-09 (×10): 50 mg via ORAL
  Filled 2021-10-03 (×12): qty 1

## 2021-10-03 MED ORDER — ASCORBIC ACID 500 MG PO TABS
500.0000 mg | ORAL_TABLET | Freq: Every day | ORAL | Status: DC
  Administered 2021-10-03 – 2021-10-09 (×7): 500 mg via ORAL
  Filled 2021-10-03 (×7): qty 1

## 2021-10-03 MED ORDER — FAMOTIDINE 20 MG PO TABS
40.0000 mg | ORAL_TABLET | Freq: Every day | ORAL | Status: DC
  Administered 2021-10-03 – 2021-10-08 (×6): 40 mg via ORAL
  Filled 2021-10-03 (×6): qty 2

## 2021-10-03 MED ORDER — ADULT MULTIVITAMIN W/MINERALS CH
1.0000 | ORAL_TABLET | Freq: Every morning | ORAL | Status: DC
  Administered 2021-10-03 – 2021-10-09 (×7): 1 via ORAL
  Filled 2021-10-03 (×7): qty 1

## 2021-10-03 MED ORDER — ALPRAZOLAM 0.25 MG PO TABS
0.5000 mg | ORAL_TABLET | Freq: Two times a day (BID) | ORAL | Status: DC
  Administered 2021-10-03 – 2021-10-09 (×13): 0.5 mg via ORAL
  Filled 2021-10-03 (×13): qty 2

## 2021-10-03 MED ORDER — ESCITALOPRAM OXALATE 10 MG PO TABS
5.0000 mg | ORAL_TABLET | Freq: Every day | ORAL | Status: DC
  Administered 2021-10-03 – 2021-10-08 (×6): 5 mg via ORAL
  Filled 2021-10-03 (×6): qty 1

## 2021-10-03 MED ORDER — FUROSEMIDE 20 MG PO TABS
20.0000 mg | ORAL_TABLET | ORAL | Status: DC
  Administered 2021-10-06 – 2021-10-09 (×4): 20 mg via ORAL
  Filled 2021-10-03 (×5): qty 1

## 2021-10-03 NOTE — Progress Notes (Signed)
ANTICOAGULATION CONSULT NOTE  Pharmacy Consult for Heparin Indication: Hx of VTE  Allergies  Allergen Reactions   Celecoxib Rash   Patient Measurements: Height: 5\' 3"  (160 cm) Weight: 117.9 kg (260 lb) IBW/kg (Calculated) : 52.4 Heparin Dosing Weight: 81.2 kg  Vital Signs: Temp: 98.7 F (37.1 C) (12/17 1525) Temp Source: Oral (12/17 1525) BP: 138/61 (12/17 1525) Pulse Rate: 71 (12/17 1525)  Labs: Recent Labs    10/01/21 1934 10/01/21 1935 10/02/21 0353 10/02/21 1423 10/03/21 0039 10/03/21 1557  HGB 11.1*  --   --   --  10.2*  --   HCT 35.0*  --   --   --  33.3*  --   PLT 201  --   --   --  171  --   APTT 34  --  65* 71* 110* 80*  LABPROT 16.5*  --   --   --   --   --   INR 1.3*  --   --   --   --   --   HEPARINUNFRC  --  >1.10* >1.10*  --  0.96*  --   CREATININE 1.40*  --   --   --   --   --      Estimated Creatinine Clearance: 43.1 mL/min (A) (by C-G formula based on SCr of 1.4 mg/dL (H)).   Medical History: Past Medical History:  Diagnosis Date   Anemia    Arthritis    osteoarthritis   Claustrophobia    Complication of anesthesia    hard time waking up after her gallbladder surgery 28 years ago   DVT (deep venous thrombosis) (HCC)    left leg   GERD (gastroesophageal reflux disease)    hx of   Heart murmur    Dr. 10/05/21 follows, 'nothing to worry about.'   History of blood transfusion    History of hiatal hernia    small   History of kidney stones    Hypercholesteremia    under control   Hypertension    Hypothyroidism    Mild aortic stenosis    a. by echo 12/2017.   Panic attack    PONV (postoperative nausea and vomiting)    Pulmonary embolism (HCC)    bilateral lungs - 2008 after disc surgery    Assessment: 75 yo female with a history of recurrent DVT. Patient is presenting after a fall at home. Heparin per pharmacy consult placed for Hx of VTE.  Patient is on Rivaroxaban 20mg  daily prior to arrival. Last dose 12/14 at 4PM. Will require  aPTT monitoring due to likely falsely high anti-Xa level secondary to DOAC use.  Repeat aPTT this afternoon is therapeutic at 80 seconds.  Goal of Therapy:  Heparin level 0.3-0.7 units/ml aPTT 66-102 seconds Monitor platelets by anticoagulation protocol: Yes   Plan:  Continue heparin 1450 units/h Daily aPTT, heparin level, CBC  , PharmD, [place], [facility] Clinical Pharmacist [phone] Please check AMION for all [facility] Pharmacy numbers 10/03/2021

## 2021-10-03 NOTE — Plan of Care (Signed)

## 2021-10-03 NOTE — Progress Notes (Signed)
ANTICOAGULATION CONSULT NOTE  Pharmacy Consult for Heparin Indication: Hx of VTE  Allergies  Allergen Reactions   Celecoxib Rash   Patient Measurements: Height: 5\' 3"  (160 cm) Weight: 117.9 kg (260 lb) IBW/kg (Calculated) : 52.4 Heparin Dosing Weight: 81.2 kg  Vital Signs: Temp: 98.1 F (36.7 C) (12/17 0449) Temp Source: Oral (12/17 0449) BP: 157/62 (12/17 0449) Pulse Rate: 82 (12/17 0449)  Labs: Recent Labs    10/01/21 1934 10/01/21 1935 10/02/21 0353 10/02/21 1423 10/03/21 0039  HGB 11.1*  --   --   --  10.2*  HCT 35.0*  --   --   --  33.3*  PLT 201  --   --   --  171  APTT 34  --  65* 71* 110*  LABPROT 16.5*  --   --   --   --   INR 1.3*  --   --   --   --   HEPARINUNFRC  --  >1.10* >1.10*  --  0.96*  CREATININE 1.40*  --   --   --   --      Estimated Creatinine Clearance: 43.1 mL/min (A) (by C-G formula based on SCr of 1.4 mg/dL (H)).   Medical History: Past Medical History:  Diagnosis Date   Anemia    Arthritis    osteoarthritis   Claustrophobia    Complication of anesthesia    hard time waking up after her gallbladder surgery 28 years ago   DVT (deep venous thrombosis) (HCC)    left leg   GERD (gastroesophageal reflux disease)    hx of   Heart murmur    Dr. 10/05/21 follows, 'nothing to worry about.'   History of blood transfusion    History of hiatal hernia    small   History of kidney stones    Hypercholesteremia    under control   Hypertension    Hypothyroidism    Mild aortic stenosis    a. by echo 12/2017.   Panic attack    PONV (postoperative nausea and vomiting)    Pulmonary embolism (HCC)    bilateral lungs - 2008 after disc surgery    Assessment: 75 yo female with a history of recurrent DVT. Patient is presenting after a fall at home. Heparin per pharmacy consult placed for Hx of VTE.  Patient is on Rivaroxaban 20mg  daily prior to arrival. Last dose 12/14 at 4PM. Will require aPTT monitoring due to likely falsely high anti-Xa  level secondary to DOAC use.  Anti-Xa level elevated as expected with Xarelto dose but downtrend, aPTT slightly supratherapeutic on 1500 units/hr, previously subtherapeutic on 1400 units/hr  Goal of Therapy:  Heparin level 0.3-0.7 units/ml aPTT 66-102 seconds Monitor platelets by anticoagulation protocol: Yes   Plan:  Decrease heparin gtt slightly to 1450 units/hr F/u 8 hour aPTT to confirm F/u ability to transition back to PO  , PharmD Clinical Pharmacist ED Pharmacist Phone # [phone] 10/03/2021 7:26 AM

## 2021-10-03 NOTE — Progress Notes (Signed)
She is stable.  She has no real complaints other than she needs her home medications reconciled.  She is scheduled for surgery Monday for stabilization of her fracture.  She has no questions at this time

## 2021-10-04 LAB — CBC
HCT: 31.9 % — ABNORMAL LOW (ref 36.0–46.0)
Hemoglobin: 9.8 g/dL — ABNORMAL LOW (ref 12.0–15.0)
MCH: 28.5 pg (ref 26.0–34.0)
MCHC: 30.7 g/dL (ref 30.0–36.0)
MCV: 92.7 fL (ref 80.0–100.0)
Platelets: 189 10*3/uL (ref 150–400)
RBC: 3.44 MIL/uL — ABNORMAL LOW (ref 3.87–5.11)
RDW: 12.7 % (ref 11.5–15.5)
WBC: 10 10*3/uL (ref 4.0–10.5)
nRBC: 0 % (ref 0.0–0.2)

## 2021-10-04 LAB — APTT: aPTT: 74 seconds — ABNORMAL HIGH (ref 24–36)

## 2021-10-04 LAB — HEPARIN LEVEL (UNFRACTIONATED): Heparin Unfractionated: 0.59 IU/mL (ref 0.30–0.70)

## 2021-10-04 NOTE — Progress Notes (Signed)
ANTICOAGULATION CONSULT NOTE  Pharmacy Consult for Heparin Indication: Hx of VTE  Allergies  Allergen Reactions   Celecoxib Rash   Patient Measurements: Height: 5\' 3"  (160 cm) Weight: 117.9 kg (260 lb) IBW/kg (Calculated) : 52.4 Heparin Dosing Weight: 81.2 kg  Vital Signs: Temp: 98.4 F (36.9 C) (12/18 0719) Temp Source: Oral (12/18 0719) BP: 121/52 (12/18 0719) Pulse Rate: 61 (12/18 0719)  Labs: Recent Labs    10/01/21 1934 10/01/21 1935 10/02/21 0353 10/02/21 1423 10/03/21 0039 10/03/21 1557 10/04/21 0324  HGB 11.1*  --   --   --  10.2*  --  9.8*  HCT 35.0*  --   --   --  33.3*  --  31.9*  PLT 201  --   --   --  171  --  189  APTT 34  --  65*   < > 110* 80* 74*  LABPROT 16.5*  --   --   --   --   --   --   INR 1.3*  --   --   --   --   --   --   HEPARINUNFRC  --    < > >1.10*  --  0.96*  --  0.59  CREATININE 1.40*  --   --   --   --   --   --    < > = values in this interval not displayed.    Estimated Creatinine Clearance: 43.1 mL/min (A) (by C-G formula based on SCr of 1.4 mg/dL (H)).   Medical History: Past Medical History:  Diagnosis Date   Anemia    Arthritis    osteoarthritis   Claustrophobia    Complication of anesthesia    hard time waking up after her gallbladder surgery 28 years ago   DVT (deep venous thrombosis) (HCC)    left leg   GERD (gastroesophageal reflux disease)    hx of   Heart murmur    Dr. 10/06/21 follows, 'nothing to worry about.'   History of blood transfusion    History of hiatal hernia    small   History of kidney stones    Hypercholesteremia    under control   Hypertension    Hypothyroidism    Mild aortic stenosis    a. by echo 12/2017.   Panic attack    PONV (postoperative nausea and vomiting)    Pulmonary embolism (HCC)    bilateral lungs - 2008 after disc surgery    Assessment: 75 yo female with a history of recurrent DVT. Patient is presenting after a fall at home. Heparin per pharmacy consult placed for Hx of  VTE.  Patient is on Rivaroxaban 20mg  daily prior to arrival. Last dose 12/14 at 4PM. Will require aPTT monitoring due to likely falsely high anti-Xa level secondary to DOAC use.   aPTT is therapeutic at 74 seconds, HL 0.59 not quite correlating yet. H/H, plt stable on IVF.  Goal of Therapy:  Heparin level 0.3-0.7 units/ml aPTT 66-102 seconds Monitor platelets by anticoagulation protocol: Yes   Plan:  Continue heparin 1450 units/hr F/u aPTT until correlates with heparin level  Monitor daily aPTT, HL, CBC/plt Monitor for signs/symptoms of bleeding     , PharmD, BCPS, BCCP Clinical Pharmacist  Please check AMION for all [facility] Pharmacy phone numbers After 10:00 PM, call Main Pharmacy [phone]

## 2021-10-04 NOTE — Plan of Care (Signed)

## 2021-10-04 NOTE — Progress Notes (Signed)
Subjective: Patient reports condition of back pain but stable neurologically  Objective: Vital signs in last 24 hours: Temp:  [98.4 F (36.9 C)-98.8 F (37.1 C)] 98.4 F (36.9 C) (12/18 0421) Pulse Rate:  [64-71] 65 (12/18 0421) Resp:  [15-16] 16 (12/18 0421) BP: (129-166)/(60-65) 129/60 (12/18 0421) SpO2:  [95 %-97 %] 95 % (12/18 0421)  Intake/Output from previous day: 12/17 0701 - 12/18 0700 In: 123 [P.O.:120; I.V.:3] Out: 1450 [Urine:1450] Intake/Output this shift: No intake/output data recorded.  Strength 5-5 lower extremities distally bilaterally pain limited proximal exam  Lab Results: Recent Labs    10/03/21 0039 10/04/21 0324  WBC 7.6 10.0  HGB 10.2* 9.8*  HCT 33.3* 31.9*  PLT 171 189   BMET Recent Labs    10/01/21 1934  NA 136  K 5.1  CL 101  CO2 29  GLUCOSE 126*  BUN 21  CREATININE 1.40*  CALCIUM 9.7    Studies/Results: MR THORACIC SPINE WO CONTRAST  Result Date: 10/02/2021 CLINICAL DATA:  T7-T8 three column injury after fall. EXAM: MRI THORACIC SPINE WITHOUT CONTRAST TECHNIQUE: Multiplanar, multisequence MR imaging of the thoracic spine was performed. No intravenous contrast was administered. COMPARISON:  CT thoracic spine from yesterday. FINDINGS: Alignment:  No significant listhesis. Vertebrae: Distraction type fracture again noted through the T7-T8 anterior endplate osteophytes with widening of the disc space anteriorly, intradiscal fluid, disruption of the anterior longitudinal ligament. The posterior longitudinal ligament is intact. Widening of the bilateral T7-T8 facet joints with associated effusion and likely disruption of the ligamentum flavum. Small dorsal and left lateral and left lateral epidural fluid collection extending from T6-T7 to T8, likely a small hematoma (series 19, images 21-27). This results in mild spinal canal stenosis. No additional acute fracture. Prior T7 and T10 vertebroplasties with unchanged ventral epidural cement most  prominently at T10. No evidence of discitis. No suspicious bone lesion. Cord: Normal signal. Unchanged triangular tenting of the ventral cord at T10, presumably related to tethering from adjacent ventral epidural cement. Paraspinal and other soft tissues: Small right-greater-than-left pleural effusions adjacent lower lobe atelectasis. Disc levels: Prior thoracolumbar posterior fusion beginning at T11. Auto fusion at T8-T9 and T9-T10. Similar multilevel spondylosis of the non fused upper and midthoracic spine. Mild spinal canal stenosis at T2-T3 due to disc bulging and posterior element hypertrophy. Unchanged mild-to-moderate bilateral neuroforaminal stenosis at T1-T2. IMPRESSION: 1. Hyperextension injury at T7-T8 with disruption of the anterior longitudinal ligament, ligamentum flavum, and widening of the anterior disc space and bilateral facet joints. Posterior longitudinal ligament appears intact. 2. Small dorsal and left lateral epidural hematoma extending from T6-T7 to T8. This results in mild spinal canal stenosis. No spinal cord injury. 3. Prior T7 and T10 vertebroplasties with unchanged triangular tenting of the ventral cord at T10, presumably related to tethering from adjacent ventral epidural cement. 4. Small bilateral pleural effusions. Electronically Signed   By: Obie Dredge M.D.   On: 10/02/2021 09:57    Assessment/Plan: Patient awaiting surgical stabilization tomorrow  LOS: 3 days     Mariam Dollar 10/04/2021, 8:12 AM

## 2021-10-05 ENCOUNTER — Inpatient Hospital Stay (HOSPITAL_COMMUNITY)
Admission: EM | Disposition: A | Discharge status: Home Health | Payer: Self-pay | Source: Home / Self Care | Attending: Neurological Surgery

## 2021-10-05 ENCOUNTER — Inpatient Hospital Stay (HOSPITAL_COMMUNITY): Payer: PPO

## 2021-10-05 ENCOUNTER — Encounter (HOSPITAL_COMMUNITY): Payer: Self-pay | Admitting: Radiology

## 2021-10-05 HISTORY — PX: APPLICATION OF ROBOTIC ASSISTANCE FOR SPINAL PROCEDURE: SHX6753

## 2021-10-05 LAB — CBC
HCT: 29.9 % — ABNORMAL LOW (ref 36.0–46.0)
Hemoglobin: 9.3 g/dL — ABNORMAL LOW (ref 12.0–15.0)
MCH: 28.6 pg (ref 26.0–34.0)
MCHC: 31.1 g/dL (ref 30.0–36.0)
MCV: 92 fL (ref 80.0–100.0)
Platelets: 176 10*3/uL (ref 150–400)
RBC: 3.25 MIL/uL — ABNORMAL LOW (ref 3.87–5.11)
RDW: 12.7 % (ref 11.5–15.5)
WBC: 7.3 10*3/uL (ref 4.0–10.5)
nRBC: 0 % (ref 0.0–0.2)

## 2021-10-05 LAB — SURGICAL PCR SCREEN
MRSA, PCR: NEGATIVE
Staphylococcus aureus: NEGATIVE

## 2021-10-05 LAB — TYPE AND SCREEN
ABO/RH(D): A POS
Antibody Screen: NEGATIVE

## 2021-10-05 SURGERY — POSTERIOR LUMBAR FUSION 2 LEVEL
Anesthesia: General | Site: Back

## 2021-10-05 MED ORDER — HYDRALAZINE HCL 20 MG/ML IJ SOLN
INTRAMUSCULAR | Status: DC | PRN
  Administered 2021-10-05: 5 mg via INTRAVENOUS

## 2021-10-05 MED ORDER — SUGAMMADEX SODIUM 200 MG/2ML IV SOLN
INTRAVENOUS | Status: DC | PRN
  Administered 2021-10-05: 250 mg via INTRAVENOUS

## 2021-10-05 MED ORDER — ALUM & MAG HYDROXIDE-SIMETH 200-200-20 MG/5ML PO SUSP
30.0000 mL | Freq: Four times a day (QID) | ORAL | Status: DC | PRN
  Administered 2021-10-07 – 2021-10-09 (×2): 30 mL via ORAL
  Filled 2021-10-05 (×2): qty 30

## 2021-10-05 MED ORDER — ACETAMINOPHEN 325 MG PO TABS
650.0000 mg | ORAL_TABLET | ORAL | Status: DC | PRN
  Administered 2021-10-08: 20:00:00 650 mg via ORAL
  Filled 2021-10-05 (×2): qty 2

## 2021-10-05 MED ORDER — DOCUSATE SODIUM 100 MG PO CAPS
100.0000 mg | ORAL_CAPSULE | Freq: Two times a day (BID) | ORAL | Status: DC
  Administered 2021-10-05 – 2021-10-09 (×8): 100 mg via ORAL
  Filled 2021-10-05 (×8): qty 1

## 2021-10-05 MED ORDER — HYDROMORPHONE HCL 1 MG/ML IJ SOLN
1.0000 mg | INTRAMUSCULAR | Status: DC | PRN
  Administered 2021-10-06 – 2021-10-08 (×14): 1 mg via INTRAVENOUS
  Filled 2021-10-05 (×14): qty 1

## 2021-10-05 MED ORDER — ONDANSETRON HCL 4 MG/2ML IJ SOLN: 4.0000 mg | Freq: Once | INTRAMUSCULAR | Status: DC | PRN

## 2021-10-05 MED ORDER — CHLORHEXIDINE GLUCONATE 0.12 % MT SOLN
15.0000 mL | Freq: Once | OROMUCOSAL | Status: AC
  Administered 2021-10-05: 13:00:00 15 mL via OROMUCOSAL

## 2021-10-05 MED ORDER — ONDANSETRON HCL 4 MG PO TABS
4.0000 mg | ORAL_TABLET | Freq: Four times a day (QID) | ORAL | Status: DC | PRN
  Administered 2021-10-09: 03:00:00 4 mg via ORAL
  Filled 2021-10-05: qty 1

## 2021-10-05 MED ORDER — ROCURONIUM BROMIDE 10 MG/ML (PF) SYRINGE
PREFILLED_SYRINGE | INTRAVENOUS | Status: AC
  Filled 2021-10-05: qty 10

## 2021-10-05 MED ORDER — HYDROMORPHONE HCL 1 MG/ML IJ SOLN
INTRAMUSCULAR | Status: AC
  Filled 2021-10-05: qty 0.5

## 2021-10-05 MED ORDER — ESOMEPRAZOLE MAGNESIUM 20 MG PO CPDR
40.0000 mg | DELAYED_RELEASE_CAPSULE | Freq: Every day | ORAL | Status: DC
  Administered 2021-10-06 – 2021-10-09 (×4): 40 mg via ORAL
  Filled 2021-10-05: qty 2
  Filled 2021-10-05: qty 1
  Filled 2021-10-05 (×3): qty 2

## 2021-10-05 MED ORDER — LIDOCAINE 2% (20 MG/ML) 5 ML SYRINGE
INTRAMUSCULAR | Status: AC
  Filled 2021-10-05: qty 5

## 2021-10-05 MED ORDER — LIDOCAINE 2% (20 MG/ML) 5 ML SYRINGE
INTRAMUSCULAR | Status: DC | PRN
  Administered 2021-10-05: 100 mg via INTRAVENOUS

## 2021-10-05 MED ORDER — DEXAMETHASONE SODIUM PHOSPHATE 10 MG/ML IJ SOLN
INTRAMUSCULAR | Status: DC | PRN
Start: 2021-10-05 — End: 2021-10-05
  Administered 2021-10-05: 10 mg via INTRAVENOUS

## 2021-10-05 MED ORDER — ROCURONIUM BROMIDE 10 MG/ML (PF) SYRINGE
PREFILLED_SYRINGE | INTRAVENOUS | Status: DC | PRN
  Administered 2021-10-05: 20 mg via INTRAVENOUS
  Administered 2021-10-05: 70 mg via INTRAVENOUS
  Administered 2021-10-05: 20 mg via INTRAVENOUS
  Administered 2021-10-05: 30 mg via INTRAVENOUS

## 2021-10-05 MED ORDER — CEFAZOLIN SODIUM-DEXTROSE 2-3 GM-%(50ML) IV SOLR
INTRAVENOUS | Status: DC | PRN
  Administered 2021-10-05: 2 g via INTRAVENOUS

## 2021-10-05 MED ORDER — DEXAMETHASONE SODIUM PHOSPHATE 10 MG/ML IJ SOLN
INTRAMUSCULAR | Status: AC
  Filled 2021-10-05: qty 1

## 2021-10-05 MED ORDER — LACTATED RINGERS IV SOLN: INTRAVENOUS | Status: DC | PRN

## 2021-10-05 MED ORDER — LABETALOL HCL 5 MG/ML IV SOLN: 5.0000 mg | INTRAVENOUS | Status: DC | PRN

## 2021-10-05 MED ORDER — BISACODYL 10 MG RE SUPP: 10.0000 mg | Freq: Every day | RECTAL | Status: DC | PRN

## 2021-10-05 MED ORDER — FENTANYL CITRATE (PF) 250 MCG/5ML IJ SOLN
INTRAMUSCULAR | Status: AC
  Filled 2021-10-05: qty 5

## 2021-10-05 MED ORDER — LIDOCAINE-EPINEPHRINE 1 %-1:100000 IJ SOLN
INTRAMUSCULAR | Status: AC
  Filled 2021-10-05: qty 1

## 2021-10-05 MED ORDER — LACTATED RINGERS IV SOLN: INTRAVENOUS | Status: DC

## 2021-10-05 MED ORDER — FLEET ENEMA 7-19 GM/118ML RE ENEM
1.0000 | ENEMA | Freq: Once | RECTAL | Status: AC | PRN
  Administered 2021-10-09: 16:00:00 1 via RECTAL
  Filled 2021-10-05: qty 1

## 2021-10-05 MED ORDER — POLYETHYLENE GLYCOL 3350 17 G PO PACK
17.0000 g | PACK | Freq: Every day | ORAL | Status: DC | PRN
  Administered 2021-10-09: 16:00:00 17 g via ORAL
  Filled 2021-10-05: qty 1

## 2021-10-05 MED ORDER — HYDRALAZINE HCL 20 MG/ML IJ SOLN
INTRAMUSCULAR | Status: AC
  Filled 2021-10-05: qty 1

## 2021-10-05 MED ORDER — SODIUM CHLORIDE 0.9% FLUSH: 3.0000 mL | INTRAVENOUS | Status: DC | PRN

## 2021-10-05 MED ORDER — ACETAMINOPHEN 500 MG PO TABS: 1000.0000 mg | ORAL_TABLET | Freq: Once | ORAL | Status: DC

## 2021-10-05 MED ORDER — CEFAZOLIN SODIUM 1 G IJ SOLR
INTRAMUSCULAR | Status: AC
  Filled 2021-10-05: qty 40

## 2021-10-05 MED ORDER — ORAL CARE MOUTH RINSE: 15.0000 mL | Freq: Once | OROMUCOSAL | Status: AC

## 2021-10-05 MED ORDER — THROMBIN 5000 UNITS EX SOLR
CUTANEOUS | Status: AC
  Filled 2021-10-05: qty 5000

## 2021-10-05 MED ORDER — BUPIVACAINE HCL (PF) 0.5 % IJ SOLN
INTRAMUSCULAR | Status: DC | PRN
  Administered 2021-10-05: 5 mL
  Administered 2021-10-05: 25 mL

## 2021-10-05 MED ORDER — THROMBIN 5000 UNITS EX SOLR
OROMUCOSAL | Status: DC | PRN
  Administered 2021-10-05 (×2): 5 mL

## 2021-10-05 MED ORDER — EPHEDRINE SULFATE-NACL 50-0.9 MG/10ML-% IV SOSY
PREFILLED_SYRINGE | INTRAVENOUS | Status: DC | PRN
  Administered 2021-10-05: 5 mg via INTRAVENOUS
  Administered 2021-10-05: 10 mg via INTRAVENOUS
  Administered 2021-10-05: 5 mg via INTRAVENOUS

## 2021-10-05 MED ORDER — 0.9 % SODIUM CHLORIDE (POUR BTL) OPTIME
TOPICAL | Status: DC | PRN
  Administered 2021-10-05: 15:00:00 1000 mL

## 2021-10-05 MED ORDER — SENNA 8.6 MG PO TABS
1.0000 | ORAL_TABLET | Freq: Two times a day (BID) | ORAL | Status: DC
  Administered 2021-10-05 – 2021-10-09 (×8): 8.6 mg via ORAL
  Filled 2021-10-05 (×8): qty 1

## 2021-10-05 MED ORDER — ONDANSETRON HCL 4 MG/2ML IJ SOLN
4.0000 mg | Freq: Four times a day (QID) | INTRAMUSCULAR | Status: DC | PRN
  Administered 2021-10-09: 10:00:00 4 mg via INTRAVENOUS
  Filled 2021-10-05: qty 2

## 2021-10-05 MED ORDER — SODIUM CHLORIDE 0.9 % IV SOLN: 250.0000 mL | INTRAVENOUS | Status: DC

## 2021-10-05 MED ORDER — PROPOFOL 10 MG/ML IV BOLUS
INTRAVENOUS | Status: AC
  Filled 2021-10-05: qty 20

## 2021-10-05 MED ORDER — RIVAROXABAN 20 MG PO TABS
20.0000 mg | ORAL_TABLET | Freq: Every day | ORAL | Status: DC
Start: 2021-10-06 — End: 2021-10-10
  Administered 2021-10-06 – 2021-10-09 (×4): 20 mg via ORAL
  Filled 2021-10-05 (×4): qty 1

## 2021-10-05 MED ORDER — GLYCOPYRROLATE PF 0.2 MG/ML IJ SOSY
PREFILLED_SYRINGE | INTRAMUSCULAR | Status: AC
  Filled 2021-10-05: qty 1

## 2021-10-05 MED ORDER — PHENYLEPHRINE HCL-NACL 20-0.9 MG/250ML-% IV SOLN
INTRAVENOUS | Status: DC | PRN
Start: 2021-10-05 — End: 2021-10-05
  Administered 2021-10-05: 40 ug/min via INTRAVENOUS

## 2021-10-05 MED ORDER — LIDOCAINE-EPINEPHRINE 1 %-1:100000 IJ SOLN
INTRAMUSCULAR | Status: DC | PRN
  Administered 2021-10-05: 5 mL

## 2021-10-05 MED ORDER — HYDROMORPHONE HCL 1 MG/ML IJ SOLN
INTRAMUSCULAR | Status: DC | PRN
  Administered 2021-10-05: .5 mg via INTRAVENOUS

## 2021-10-05 MED ORDER — GLYCOPYRROLATE PF 0.2 MG/ML IJ SOSY
PREFILLED_SYRINGE | INTRAMUSCULAR | Status: DC | PRN
  Administered 2021-10-05: .2 mg via INTRAVENOUS

## 2021-10-05 MED ORDER — MENTHOL 3 MG MT LOZG: 1.0000 | LOZENGE | OROMUCOSAL | Status: DC | PRN

## 2021-10-05 MED ORDER — BUPIVACAINE HCL (PF) 0.5 % IJ SOLN
INTRAMUSCULAR | Status: AC
  Filled 2021-10-05: qty 30

## 2021-10-05 MED ORDER — ONDANSETRON HCL 4 MG/2ML IJ SOLN
INTRAMUSCULAR | Status: DC | PRN
  Administered 2021-10-05: 4 mg via INTRAVENOUS

## 2021-10-05 MED ORDER — ONDANSETRON HCL 4 MG/2ML IJ SOLN
INTRAMUSCULAR | Status: AC
  Filled 2021-10-05: qty 2

## 2021-10-05 MED ORDER — FENTANYL CITRATE (PF) 250 MCG/5ML IJ SOLN
INTRAMUSCULAR | Status: DC | PRN
  Administered 2021-10-05 (×5): 50 ug via INTRAVENOUS

## 2021-10-05 MED ORDER — SODIUM CHLORIDE 0.9% FLUSH
3.0000 mL | Freq: Two times a day (BID) | INTRAVENOUS | Status: DC
  Administered 2021-10-05 – 2021-10-09 (×7): 3 mL via INTRAVENOUS

## 2021-10-05 MED ORDER — PHENOL 1.4 % MT LIQD: 1.0000 | OROMUCOSAL | Status: DC | PRN

## 2021-10-05 MED ORDER — OXYCODONE HCL 5 MG PO TABS: 5.0000 mg | ORAL_TABLET | Freq: Once | ORAL | Status: DC | PRN

## 2021-10-05 MED ORDER — METHOCARBAMOL 500 MG PO TABS: 500.0000 mg | ORAL_TABLET | Freq: Four times a day (QID) | ORAL | Status: DC | PRN

## 2021-10-05 MED ORDER — OXYCODONE HCL 5 MG/5ML PO SOLN: 5.0000 mg | Freq: Once | ORAL | Status: DC | PRN

## 2021-10-05 MED ORDER — CEFAZOLIN SODIUM-DEXTROSE 2-4 GM/100ML-% IV SOLN
2.0000 g | Freq: Three times a day (TID) | INTRAVENOUS | Status: AC
  Administered 2021-10-05 – 2021-10-06 (×2): 2 g via INTRAVENOUS
  Filled 2021-10-05 (×2): qty 100

## 2021-10-05 MED ORDER — FENTANYL CITRATE (PF) 100 MCG/2ML IJ SOLN: 25.0000 ug | INTRAMUSCULAR | Status: DC | PRN

## 2021-10-05 MED ORDER — ACETAMINOPHEN 650 MG RE SUPP: 650.0000 mg | RECTAL | Status: DC | PRN

## 2021-10-05 MED ORDER — METHOCARBAMOL 1000 MG/10ML IJ SOLN: 500.0000 mg | Freq: Four times a day (QID) | INTRAVENOUS | Status: DC | PRN

## 2021-10-05 MED ORDER — EPHEDRINE 5 MG/ML INJ
INTRAVENOUS | Status: AC
  Filled 2021-10-05: qty 5

## 2021-10-05 MED ORDER — PROPOFOL 10 MG/ML IV BOLUS
INTRAVENOUS | Status: DC | PRN
  Administered 2021-10-05: 40 mg via INTRAVENOUS
  Administered 2021-10-05: 80 mg via INTRAVENOUS

## 2021-10-05 MED ORDER — APREPITANT 40 MG PO CAPS
40.0000 mg | ORAL_CAPSULE | Freq: Once | ORAL | Status: AC
  Administered 2021-10-05: 13:00:00 40 mg via ORAL
  Filled 2021-10-05: qty 1

## 2021-10-05 SURGICAL SUPPLY — 94 items
BAG COUNTER SPONGE SURGICOUNT (BAG) ×6 IMPLANT
BAG SURGICOUNT SPONGE COUNTING (BAG) ×2
BASKET BONE COLLECTION (BASKET) ×2 IMPLANT
BIT DRILL LONG 3.0X30 (BIT) ×1 IMPLANT
BIT DRILL LONG 3.0X30MM (BIT) ×1
BIT DRILL LONG 3X80 (BIT) IMPLANT
BIT DRILL LONG 3X80MM (BIT)
BIT DRILL LONG 4X80 (BIT) IMPLANT
BIT DRILL LONG 4X80MM (BIT)
BIT DRILL SHORT 3.0X30 (BIT) IMPLANT
BIT DRILL SHORT 3.0X30MM (BIT)
BIT DRILL SHORT 3X80 (BIT) IMPLANT
BIT DRILL SHORT 3X80MM (BIT)
BLADE CLIPPER SURG (BLADE) IMPLANT
BLADE SURG 11 STRL SS (BLADE) ×4 IMPLANT
BONE CANC CHIPS 20CC PCAN1/4 (Bone Implant) ×4 IMPLANT
BUR MATCHSTICK NEURO 3.0 LAGG (BURR) ×4 IMPLANT
CANISTER SUCT 3000ML PPV (MISCELLANEOUS) ×4 IMPLANT
CEMENT KYPHON C01A KIT/MIXER (Cement) ×2 IMPLANT
CHIPS CANC BONE 20CC PCAN1/4 (Bone Implant) ×2 IMPLANT
CNTNR URN SCR LID CUP LEK RST (MISCELLANEOUS) ×2 IMPLANT
CONT SPEC 4OZ STRL OR WHT (MISCELLANEOUS) ×4
COVER BACK TABLE 60X90IN (DRAPES) ×4 IMPLANT
DECANTER SPIKE VIAL GLASS SM (MISCELLANEOUS) ×4 IMPLANT
DERMABOND ADVANCED (GAUZE/BANDAGES/DRESSINGS) ×2
DERMABOND ADVANCED .7 DNX12 (GAUZE/BANDAGES/DRESSINGS) ×2 IMPLANT
DEVICE DISSECT PLASMABLAD 3.0S (MISCELLANEOUS) ×2 IMPLANT
DRAPE C-ARM 42X72 X-RAY (DRAPES) ×8 IMPLANT
DRAPE HALF SHEET 40X57 (DRAPES) IMPLANT
DRAPE LAPAROTOMY 100X72X124 (DRAPES) ×4 IMPLANT
DRAPE SHEET LG 3/4 BI-LAMINATE (DRAPES) ×4 IMPLANT
DRAPE WARM FLUID 44X44 (DRAPES) ×2 IMPLANT
DRSG OPSITE POSTOP 4X8 (GAUZE/BANDAGES/DRESSINGS) ×2 IMPLANT
DURAPREP 26ML APPLICATOR (WOUND CARE) ×4 IMPLANT
DURASEAL APPLICATOR TIP (TIP) IMPLANT
DURASEAL SPINE SEALANT 3ML (MISCELLANEOUS) IMPLANT
ELECT BLADE 4.0 EZ CLEAN MEGAD (MISCELLANEOUS)
ELECT REM PT RETURN 9FT ADLT (ELECTROSURGICAL) ×4
ELECTRODE BLDE 4.0 EZ CLN MEGD (MISCELLANEOUS) IMPLANT
ELECTRODE REM PT RTRN 9FT ADLT (ELECTROSURGICAL) ×2 IMPLANT
GAUZE 4X4 16PLY ~~LOC~~+RFID DBL (SPONGE) ×2 IMPLANT
GAUZE SPONGE 4X4 12PLY STRL (GAUZE/BANDAGES/DRESSINGS) ×4 IMPLANT
GLOVE SURG LTX SZ8.5 (GLOVE) ×8 IMPLANT
GLOVE SURG UNDER POLY LF SZ6.5 (GLOVE) ×2 IMPLANT
GLOVE SURG UNDER POLY LF SZ8.5 (GLOVE) ×8 IMPLANT
GOWN STRL REUS W/ TWL LRG LVL3 (GOWN DISPOSABLE) IMPLANT
GOWN STRL REUS W/ TWL XL LVL3 (GOWN DISPOSABLE) IMPLANT
GOWN STRL REUS W/TWL 2XL LVL3 (GOWN DISPOSABLE) ×8 IMPLANT
GOWN STRL REUS W/TWL LRG LVL3 (GOWN DISPOSABLE) ×4
GOWN STRL REUS W/TWL XL LVL3 (GOWN DISPOSABLE)
GRAFT BNE CANC CHIPS 1-8 20CC (Bone Implant) IMPLANT
GRAFT BONE PROTEIOS LRG 5CC (Orthopedic Implant) ×2 IMPLANT
GUIDEWIRE NITINOL BEVEL TIP (WIRE) ×16 IMPLANT
HEMOSTAT POWDER KIT SURGIFOAM (HEMOSTASIS) ×4 IMPLANT
KIT BASIN OR (CUSTOM PROCEDURE TRAY) ×4 IMPLANT
KIT GRAFTMAG DEL NEURO DISP (NEUROSURGERY SUPPLIES) ×4 IMPLANT
KIT SPINE MAZOR X ROBO DISP (MISCELLANEOUS) ×4 IMPLANT
KIT TURNOVER KIT B (KITS) ×4 IMPLANT
MILL MEDIUM DISP (BLADE) ×2 IMPLANT
NDL RELINE-OR FENS 16 (NEEDLE) IMPLANT
NDL SPNL 18GX3.5 QUINCKE PK (NEEDLE) IMPLANT
NEEDLE HYPO 22GX1.5 SAFETY (NEEDLE) ×4 IMPLANT
NEEDLE RELINE-OR FENS 16 (NEEDLE) ×40 IMPLANT
NEEDLE SPNL 18GX3.5 QUINCKE PK (NEEDLE) IMPLANT
NS IRRIG 1000ML POUR BTL (IV SOLUTION) ×4 IMPLANT
PACK LAMINECTOMY NEURO (CUSTOM PROCEDURE TRAY) ×4 IMPLANT
PAD ARMBOARD 7.5X6 YLW CONV (MISCELLANEOUS) ×12 IMPLANT
PATTIES SURGICAL .5 X1 (DISPOSABLE) ×4 IMPLANT
PIN HEAD 2.5X60MM (PIN) IMPLANT
PLASMABLADE 3.0S (MISCELLANEOUS) ×4
PUSHER RELINE FENS 36 OR (MISCELLANEOUS) ×12 IMPLANT
ROD RELINE-O LORD 5.5X110MM (Rod) ×4 IMPLANT
SCREW LOCK RELINE 5.5 TULIP (Screw) ×16 IMPLANT
SCREW PA RELINE 4.5X45 (Screw) ×8 IMPLANT
SCREW RELINE 2FC POLY 4.5X35 (Screw) ×4 IMPLANT
SCREW RELINE 4.5X40 2FC POLY (Screw) ×4 IMPLANT
SCREW SCHANZ SA 4.0MM (MISCELLANEOUS) IMPLANT
SPONGE SURGIFOAM ABS GEL 100 (HEMOSTASIS) ×4 IMPLANT
SPONGE T-LAP 4X18 ~~LOC~~+RFID (SPONGE) ×2 IMPLANT
SUT PROLENE 6 0 BV (SUTURE) IMPLANT
SUT VIC AB 1 CT1 18XBRD ANBCTR (SUTURE) ×2 IMPLANT
SUT VIC AB 1 CT1 8-18 (SUTURE) ×4
SUT VIC AB 2-0 CP2 18 (SUTURE) ×4 IMPLANT
SUT VIC AB 3-0 SH 8-18 (SUTURE) ×4 IMPLANT
SUT VIC AB 4-0 RB1 18 (SUTURE) ×8 IMPLANT
SYR 3ML LL SCALE MARK (SYRINGE) ×16 IMPLANT
SYR 5ML LL (SYRINGE) IMPLANT
TIP FENS REPLACE RELINE (MISCELLANEOUS) ×12 IMPLANT
TOWEL GREEN STERILE (TOWEL DISPOSABLE) ×4 IMPLANT
TOWEL GREEN STERILE FF (TOWEL DISPOSABLE) ×4 IMPLANT
TRAY FOLEY MTR SLVR 14FR STAT (SET/KITS/TRAYS/PACK) IMPLANT
TRAY FOLEY MTR SLVR 16FR STAT (SET/KITS/TRAYS/PACK) ×2 IMPLANT
TUBE MAZOR SA REDUCTION (TUBING) ×4 IMPLANT
WATER STERILE IRR 1000ML POUR (IV SOLUTION) ×4 IMPLANT

## 2021-10-05 NOTE — Anesthesia Preprocedure Evaluation (Addendum)
Anesthesia Evaluation  Patient identified by MRN, date of birth, ID band Patient awake    Reviewed: Allergy & Precautions, NPO status , Patient's Chart, lab work & pertinent test results, reviewed documented beta blocker date and time   History of Anesthesia Complications (+) PONV, PROLONGED EMERGENCE and history of anesthetic complications  Airway Mallampati: II  TM Distance: >3 FB Neck ROM: Full    Dental  (+) Dental Advisory Given   Pulmonary PE   Pulmonary exam normal        Cardiovascular hypertension, Pt. on medications and Pt. on home beta blockers + CAD and + DVT  Normal cardiovascular exam   '19 TTE - EF 55% to 60%. AV is difficult to see well.Peak and mean gradients through the valve are 24 and 11 mm Hg respectively consistent with mild AS. Trivial TR.    Neuro/Psych PSYCHIATRIC DISORDERS Anxiety negative neurological ROS     GI/Hepatic Neg liver ROS, hiatal hernia, GERD  Controlled,  Endo/Other  Hypothyroidism Morbid obesity  Renal/GU CRFRenal disease     Musculoskeletal  (+) Arthritis ,   Abdominal   Peds  Hematology  (+) anemia ,  On xarelto     Anesthesia Other Findings   Reproductive/Obstetrics                            Anesthesia Physical Anesthesia Plan  ASA: 3  Anesthesia Plan: General   Post-op Pain Management: Tylenol PO (pre-op)   Induction: Intravenous  PONV Risk Score and Plan: 4 or greater and Treatment may vary due to age or medical condition, Ondansetron and Dexamethasone  Airway Management Planned: Oral ETT  Additional Equipment: Arterial line  Intra-op Plan:   Post-operative Plan: Extubation in OR  Informed Consent: I have reviewed the patients History and Physical, chart, labs and discussed the procedure including the risks, benefits and alternatives for the proposed anesthesia with the patient or authorized representative who has indicated  his/her understanding and acceptance.     Dental advisory given  Plan Discussed with: CRNA and Anesthesiologist  Anesthesia Plan Comments:        Anesthesia Quick Evaluation

## 2021-10-05 NOTE — Anesthesia Procedure Notes (Signed)
Procedure Name: Intubation Date/Time: 10/05/2021 2:14 PM Performed by: Erick Colace, CRNA Pre-anesthesia Checklist: Patient identified, Emergency Drugs available, Suction available and Patient being monitored Patient Re-evaluated:Patient Re-evaluated prior to induction Oxygen Delivery Method: Circle system utilized Preoxygenation: Pre-oxygenation with 100% oxygen Induction Type: IV induction Ventilation: Mask ventilation without difficulty and Oral airway inserted - appropriate to patient size Laryngoscope Size: Mac and 4 Grade View: Grade I Tube type: Oral Tube size: 7.5 mm Number of attempts: 1 Airway Equipment and Method: Stylet and Oral airway Placement Confirmation: ETT inserted through vocal cords under direct vision, positive ETCO2 and breath sounds checked- equal and bilateral Secured at: 21 cm Tube secured with: Tape Dental Injury: Teeth and Oropharynx as per pre-operative assessment

## 2021-10-05 NOTE — Progress Notes (Signed)
Patient ID: [name], female   DOB: 10/19/1945, 75 y.o.   MRN: [id number] Vital signs are stable Patient complaining of left hip and left leg pain T7-T8 fracture dislocation will be stabilized posteriorly with pedicle fixation using robotic assistance.

## 2021-10-05 NOTE — Transfer of Care (Signed)
Immediate Anesthesia Transfer of Care Note  Patient: [name]  Procedure(s) Performed: Thoracic six-Thoracic nine Pedicle Screw Fixation With Mazor (Back) APPLICATION OF ROBOTIC ASSISTANCE FOR SPINAL PROCEDURE  Patient Location: PACU  Anesthesia Type:General  Level of Consciousness: awake and alert   Airway & Oxygen Therapy: Patient Spontanous Breathing  Post-op Assessment: Report given to RN, Post -op Vital signs reviewed and stable and Patient moving all extremities X 4  Post vital signs: Reviewed and stable  Last Vitals:  Vitals Value Taken Time  BP 169/80 10/05/21 1802  Temp 37.1 C 10/05/21 1802  Pulse 81 10/05/21 1806  Resp 15 10/05/21 1806  SpO2 95 % 10/05/21 1806  Vitals shown include unvalidated device data.  Last Pain:  Vitals:   10/05/21 1248  TempSrc:   PainSc: 9       Patients Stated Pain Goal: 2 (10/05/21 1248)  Complications: No notable events documented.

## 2021-10-05 NOTE — Progress Notes (Signed)
To O.R. via bed

## 2021-10-05 NOTE — Plan of Care (Signed)

## 2021-10-05 NOTE — Anesthesia Procedure Notes (Signed)
Arterial Line Insertion Start/End12/19/2022 12:50 PM, 10/05/2021 1:00 PM Performed by: Lelon Perla, CRNA, CRNA  Patient location: Pre-op. Preanesthetic checklist: patient identified, IV checked, site marked, risks and benefits discussed, surgical consent, monitors and equipment checked, pre-op evaluation, timeout performed and anesthesia consent Lidocaine 1% used for infiltration Left, radial was placed Catheter size: 20 G Hand hygiene performed  and maximum sterile barriers used   Attempts: 1 Procedure performed without using ultrasound guided technique. Following insertion, dressing applied and Biopatch. Post procedure assessment: normal and unchanged  Patient tolerated the procedure well with no immediate complications.

## 2021-10-05 NOTE — Op Note (Signed)
Date of surgery: 10/05/2021 Preoperative diagnosis: T7-T8 fracture dislocation.history of T7 compression fracture Postoperative diagnosis: Same Procedure: Posterior fixation from T6-T9 of T7-T8 fracture dislocation posterior arthrodesis with allograft T6-T9, Proteoss.  Mazor robotic assistance.  Methylmethacrylate augmentation. Surgeon: Barnett Abu Anesthesia: General endotracheal Indications: [name] is a 75 year old individual who sustained a fall from standing height she was on an elevated platform having a dress fitted.  She sustained a T7-T8 fracture dislocation and she has had a previous acrylic balloon kyphoplasty in the T7 vertebrae for compression fracture that was not healing.  She is advised regarding surgical stabilization which would be from T6-T9 with augmented pedicle screws.  Procedure: Patient was brought to the operating room supine on the stretcher.  After the smooth induction of general endotracheal anesthesia she was carefully turned onto the Monument table in the prone position.  The bony prominences were appropriately padded and protected.  The back was prepped with alcohol DuraPrep and draped in a sterile fashion.  A midline incision was created overlying the thoracic region after marking the appropriate vertebrae preoperatively.  The dissection was carried down to the thoracodorsal fascia which was opened on either side of midline and a subperiosteal dissection was performed to expose out to the facet joints from T6 down to T9.  The T10 spinous process was exposed so that placement of a spinous process clamp to attach the robotic arm could be applied.  Once this was applied and the dissection had been carried out the robotic arm was registered to preoperatively obtain CT scan.  Then using robotic assistance we placed K wires into the vertebrae at T6-T7-T8 and T9.  Care was taken to avoid medial skiving which was apparent from the projected plan.  Once the K wires were  placed then pedicle screws were placed after checking radiographically for placement of the wires.  4.5 x 40 mm screws were placed in T6 4.5 x 35 mm screws were placed in T7 where there is a previous kyphoplasty 4.5 x 45 mm screws were placed in T8 and T9 respectively.  Once the screws were placed the heads were placed for administration of methacrylate augmentation to the screws this was done with 1 cc boluses of methacrylate after it was allowed to harden to the appropriate consistency fluoroscopic imaging was obtained during this placement.  Once the cement was placed and allowed to harden screw head heights were adjusted and a mildly contoured rod measuring 110 mm in length was used to connect the screws in a neutral construct.  The spinous regions and the facet joints were then decorticated and a total of 20 cc of cortical cancellous allograft was admixed with 5 cc of Proteus and this was carefully packed into the lateral gutters from T6 down to T9.  Once the graft was packed final radiographs were obtained in AP and lateral projection demonstrating good placement of the hardware and good alignment of the spine in the neutral construct.  With this closure was performed using #1 Vicryl in interrupted fashion thoracodorsal fascia 2-0 Vicryl was used in the subcutaneous tissues and 3-0 and 4-0 Vicryl was used in the subcuticular closure.  Dermabond was placed on the skin and blood loss was estimated at about 100 cc.

## 2021-10-06 ENCOUNTER — Encounter (HOSPITAL_COMMUNITY): Payer: Self-pay | Admitting: Neurological Surgery

## 2021-10-06 LAB — CBC
HCT: 28.6 % — ABNORMAL LOW (ref 36.0–46.0)
Hemoglobin: 9.2 g/dL — ABNORMAL LOW (ref 12.0–15.0)
MCH: 28.8 pg (ref 26.0–34.0)
MCHC: 32.2 g/dL (ref 30.0–36.0)
MCV: 89.4 fL (ref 80.0–100.0)
Platelets: 186 10*3/uL (ref 150–400)
RBC: 3.2 MIL/uL — ABNORMAL LOW (ref 3.87–5.11)
RDW: 12.4 % (ref 11.5–15.5)
WBC: 10.7 10*3/uL — ABNORMAL HIGH (ref 4.0–10.5)
nRBC: 0 % (ref 0.0–0.2)

## 2021-10-06 LAB — BASIC METABOLIC PANEL
Anion gap: 9 (ref 5–15)
BUN: 18 mg/dL (ref 8–23)
CO2: 26 mmol/L (ref 22–32)
Calcium: 9.2 mg/dL (ref 8.9–10.3)
Chloride: 99 mmol/L (ref 98–111)
Creatinine, Ser: 1.04 mg/dL — ABNORMAL HIGH (ref 0.44–1.00)
GFR, Estimated: 56 mL/min — ABNORMAL LOW (ref >60)
Glucose, Bld: 133 mg/dL — ABNORMAL HIGH (ref 70–99)
Potassium: 4.4 mmol/L (ref 3.5–5.1)
Sodium: 134 mmol/L — ABNORMAL LOW (ref 135–145)

## 2021-10-06 MED FILL — Thrombin (Recombinant) For Soln 5000 Unit: CUTANEOUS | Qty: 5000 | Status: AC

## 2021-10-06 NOTE — Anesthesia Postprocedure Evaluation (Signed)
Anesthesia Post Note  Patient: [name]  Procedure(s) Performed: Thoracic six-Thoracic nine Pedicle Screw Fixation With Mazor (Back) APPLICATION OF ROBOTIC ASSISTANCE FOR SPINAL PROCEDURE     Patient location during evaluation: PACU Anesthesia Type: General Level of consciousness: awake and alert Pain management: pain level controlled Vital Signs Assessment: post-procedure vital signs reviewed and stable Respiratory status: spontaneous breathing, nonlabored ventilation, respiratory function stable and patient connected to nasal cannula oxygen Cardiovascular status: stable and blood pressure returned to baseline Anesthetic complications: no   No notable events documented.  Last Vitals:  Vitals:   10/06/21 0600 10/06/21 0800  BP:  (!) 128/53  Pulse: 65 64  Resp: 14 13  Temp:    SpO2: 93% 95%    Last Pain:  Vitals:   10/06/21 0603  TempSrc:   PainSc: 5                  Beryle Lathe

## 2021-10-06 NOTE — Evaluation (Signed)
Physical Therapy Evaluation Patient Details Name: [name] MRN: [id number] DOB: [birth year] Today's Date: 10/06/2021  History of Present Illness  [name] is a 75 year old individual admitted 10/01/21 after a fall from standing height she was on an elevated platform having a dress fitted.  She sustained a T7-T8 fracture dislocation and she has had a previous acrylic balloon kyphoplasty in the T7 vertebrae for compression fracture that was not healing. On 10/05/21 s/p Posterior fixation from T6-T9 of T7-T8 fracture dislocation posterior arthrodesis with allograft T6-T9. PMH includes but not limited to: 14 back surgeries, bil TKA, DVT, PE.  Clinical Impression  Pt admitted with above diagnosis. At baseline, pt ambulates with RW, has some assist with lower body ADLs, and is able to go up her 2 steps. She has DME and will have assist from spouse at d/c.  Pt has had multiple back surgeries and done SNF in past - she is wanting to go home at d/c as she is familiar with recovery from back surgery and has support.  Today, pt requiring mod A for bed mobility (but sleeps in recliner at home), minA to stand , and ambulated 100' with min guard.  She does have back pain and L hip pain with activity. Pt with potential to progress to home with HHPT.  She will need to be able to go up 2 steps to get in her home. Pt currently with functional limitations due to the deficits listed below (see PT Problem List). Pt will benefit from skilled PT to increase their independence and safety with mobility to allow discharge to the venue listed below.          Recommendations for follow up therapy are one component of a multi-disciplinary discharge planning process, led by the attending physician.  Recommendations may be updated based on patient status, additional functional criteria and insurance authorization.  Follow Up Recommendations Home health PT    Assistance Recommended at Discharge Frequent or constant  Supervision/Assistance  Functional Status Assessment Patient has had a recent decline in their functional status and demonstrates the ability to make significant improvements in function in a reasonable and predictable amount of time.  Equipment Recommendations  None recommended by PT    Recommendations for Other Services       Precautions / Restrictions Precautions Precautions: Fall;Back Precaution Booklet Issued: Yes (comment) Precaution Comments: watch O2 Required Braces or Orthoses: Other Brace Other Brace: No brace needed      Mobility  Bed Mobility Overal bed mobility: Needs Assistance Bed Mobility: Sidelying to Sit;Sit to Sidelying   Sidelying to sit: Mod assist;HOB elevated     Sit to sidelying: Mod assist;HOB elevated General bed mobility comments: Increased time and cues for log roll; Pt does not sleep in bed at home - uses recliner    Transfers Overall transfer level: Needs assistance Equipment used: Rolling walker (2 wheels) Transfers: Sit to/from Stand Sit to Stand: Min assist;From elevated surface           General transfer comment: Min A from elevated bed and toilet (with grab bar); increased time    Ambulation/Gait Ambulation/Gait assistance: Min guard Gait Distance (Feet): 100 Feet Assistive device: Rolling walker (2 wheels) Gait Pattern/deviations: Step-to pattern;Decreased stride length Gait velocity: decreased     General Gait Details: Low foot clearance but not shuffling; min cues for RW proxmity and posture as able  Information systems manager  Rankin (Stroke Patients Only)       Balance Overall balance assessment: Needs assistance Sitting-balance support: Feet supported Sitting balance-Leahy Scale: Good     Standing balance support: Bilateral upper extremity supported;Reliant on assistive device for balance Standing balance-Leahy Scale: Poor Standing balance comment: requiring RW                              Pertinent Vitals/Pain Pain Assessment: 0-10 Pain Score: 8  Pain Location: back incision and L hip (5/10 rest, 8/10 activity) Pain Descriptors / Indicators: Grimacing;Discomfort;Sore Pain Intervention(s): Limited activity within patient's tolerance;Monitored during session;Repositioned;Patient requesting pain meds-RN notified    Home Living Family/patient expects to be discharged to:: Private residence Living Arrangements: Spouse/significant other Available Help at Discharge: Family;Available 24 hours/day Type of Home: House Home Access: Stairs to enter Entrance Stairs-Rails: Left;Right (uses L) Entrance Stairs-Number of Steps: 2 to porch, then one into house   Home Layout: One level Home Equipment: Agricultural consultant (2 wheels);Cane - single point;Shower seat - built in;Hand held shower head;Wheelchair - manual Additional Comments: pt reports they have a built in bench in her shower, it is too high, she has a shower seat she can use instead. Husband can assist physically if needed. Son and daughter in law are next door and assit with IADLs    Prior Function Prior Level of Function : Needs assist       Physical Assist : ADLs (physical)   ADLs (physical): Dressing Mobility Comments: has a WC but typically uses RW ADLs Comments: not uncommon for her husband to assist with socks and shoes while she is in her recliner     Hand Dominance   Dominant Hand: Right    Extremity/Trunk Assessment   Upper Extremity Assessment Upper Extremity Assessment: Defer to OT evaluation    Lower Extremity Assessment Lower Extremity Assessment: LLE deficits/detail;RLE deficits/detail RLE Deficits / Details: ROM WFL; MMT: hip 2/5, knee 3/5 (not further tested), ankle 5/5 LLE Deficits / Details: ROM WFL; MMT: hip 2/5, knee 3/5 (not further tested), ankle 5/5; hip painful    Cervical / Trunk Assessment Cervical / Trunk Assessment: Back Surgery  Communication   Communication:  No difficulties  Cognition Arousal/Alertness: Awake/alert Behavior During Therapy: WFL for tasks assessed/performed Overall Cognitive Status: Within Functional Limits for tasks assessed                                          General Comments General comments (skin integrity, edema, etc.): VSS on RA    Exercises     Assessment/Plan    PT Assessment Patient needs continued PT services  PT Problem List Decreased strength;Decreased mobility;Decreased safety awareness;Decreased range of motion;Decreased knowledge of precautions;Decreased activity tolerance;Decreased balance;Decreased knowledge of use of DME;Pain       PT Treatment Interventions DME instruction;Therapeutic activities;Modalities;Gait training;Therapeutic exercise;Patient/family education;Stair training;Balance training;Functional mobility training    PT Goals (Current goals can be found in the Care Plan section)  Acute Rehab PT Goals Patient Stated Goal: return home - states "been through this before, I want to go home"; go to grandsons wedding Jan 7th PT Goal Formulation: With patient/family Time For Goal Achievement: 10/20/21 Potential to Achieve Goals: Good    Frequency Min 5X/week   Barriers to discharge        Co-evaluation  AM-PAC PT "6 Clicks" Mobility  Outcome Measure Help needed turning from your back to your side while in a flat bed without using bedrails?: A Lot Help needed moving from lying on your back to sitting on the side of a flat bed without using bedrails?: A Lot Help needed moving to and from a bed to a chair (including a wheelchair)?: A Little Help needed standing up from a chair using your arms (e.g., wheelchair or bedside chair)?: A Little Help needed to walk in hospital room?: A Little Help needed climbing 3-5 steps with a railing? : Total 6 Click Score: 14    End of Session Equipment Utilized During Treatment: Gait belt Activity Tolerance:  Patient tolerated treatment well Patient left: in bed;with call bell/phone within reach (notified RN SCDs do not fit) Nurse Communication: Mobility status PT Visit Diagnosis: Other abnormalities of gait and mobility (R26.89);Muscle weakness (generalized) (M62.81)    Time: 1657-9038 PT Time Calculation (min) (ACUTE ONLY): 37 min   Charges:   PT Evaluation $PT Eval Moderate Complexity: 1 Mod PT Treatments $Gait Training: 8-22 mins        Anise Salvo, PT Acute Rehab Services Pager [phone] [facility] Rehab [phone]   Rayetta Humphrey 10/06/2021, 4:59 PM

## 2021-10-06 NOTE — Progress Notes (Signed)
Foley catheter removed POD # 1 at this time. Patient tolerated procedure without difficulty. Patient made aware of need to void within 6 hours. Purewick implemented.

## 2021-10-06 NOTE — Evaluation (Signed)
Occupational Therapy Evaluation Patient Details Name: [name] MRN: [id number] DOB: December 30, 1945 Today's Date: 10/06/2021   History of Present Illness [name] is a 75 year old individual admitted 10/01/21 after a fall from standing height she was on an elevated platform having a dress fitted.  She sustained a T7-T8 fracture dislocation and she has had a previous acrylic balloon kyphoplasty in the T7 vertebrae for compression fracture that was not healing. Now s/p Posterior fixation from T6-T9 of T7-T8 fracture dislocation posterior arthrodesis with allograft T6-T9. PMH includes: T11 kyphoplasty, DVT, PE.   Clinical Impression   Pt is typically mostly mod I with occasional assist for LB dressing from her husband. She sits for showering, and uses DME (SPC or RW) for mobility. She does endorse frequent falls. Today she is max A for LB ADL, min A for UB ADL, min guard for standing grooming (shorter activity tolerance), and mod A for boost with transfers for toileting. She was max A - able to maintain standing and OT performed peri care for Pt. At this time, Pt will benefit from skilled OT in the acute setting as well as afterwards at the [facility] level - she has assist from family 24/7, and REALLY wants to be able to attend her grandson's wedding on the 7th of Jan. She did endorse pain in the L hip and at the incision site. OT will continue to monitor closely for progression to ensure that [facility] is the safest option that will still maximize independence.       Recommendations for follow up therapy are one component of a multi-disciplinary discharge planning process, led by the attending physician.  Recommendations may be updated based on patient status, additional functional criteria and insurance authorization.   Follow Up Recommendations  Home health OT    Assistance Recommended at Discharge Frequent or constant Supervision/Assistance  Functional Status Assessment  Patient has had a recent  decline in their functional status and demonstrates the ability to make significant improvements in function in a reasonable and predictable amount of time.  Equipment Recommendations  Tub/shower bench    Recommendations for Other Services PT consult     Precautions / Restrictions Precautions Precautions: Fall Precaution Comments: watch O2 Restrictions Weight Bearing Restrictions: No      Mobility Bed Mobility               General bed mobility comments: in chair at entry and at exit    Transfers Overall transfer level: Needs assistance Equipment used: Rolling walker (2 wheels) Transfers: Sit to/from Stand Sit to Stand: Mod assist           General transfer comment: mos A for boost, min guard once in standing, good hand placement, requires increased time      Balance Overall balance assessment: Needs assistance Sitting-balance support: Feet supported Sitting balance-Leahy Scale: Fair     Standing balance support: Bilateral upper extremity supported;Reliant on assistive device for balance Standing balance-Leahy Scale: Poor                             ADL either performed or assessed with clinical judgement   ADL Overall ADL's : Needs assistance/impaired Eating/Feeding: Set up;Sitting Eating/Feeding Details (indicate cue type and reason): clear diet Grooming: Wash/dry hands;Wash/dry face;Min guard;Standing   Upper Body Bathing: Moderate assistance;Sitting   Lower Body Bathing: Maximal assistance   Upper Body Dressing : Minimal assistance;Sitting   Lower Body Dressing: Maximal assistance  Toilet Transfer: Moderate assistance;Ambulation;Rolling walker (2 wheels);Requires wide/bariatric Toilet Transfer Details (indicate cue type and reason): mod A to boost up, then min guard for ambulation Toileting- Clothing Manipulation and Hygiene: Maximal assistance;Sit to/from stand Toileting - Clothing Manipulation Details (indicate cue type and  reason): Pt able to maintain standing for OT to perform peri care Tub/ Shower Transfer: Minimal assistance;Shower seat;Rolling walker (2 wheels)   Functional mobility during ADLs: Minimal assistance;Rolling walker (2 wheels) (after mod A to stand) General ADL Comments: has used AE in the past for LB dressing/bathing     Vision Baseline Vision/History: 1 Wears glasses Ability to See in Adequate Light: 0 Adequate Patient Visual Report: No change from baseline Vision Assessment?: No apparent visual deficits     Perception     Praxis      Pertinent Vitals/Pain Pain Assessment: 0-10 Pain Score: 4  Pain Location: back incision and L hip Pain Descriptors / Indicators: Constant;Grimacing;Discomfort;Sore Pain Intervention(s): Monitored during session;Repositioned     Hand Dominance Right   Extremity/Trunk Assessment Upper Extremity Assessment Upper Extremity Assessment: RUE deficits/detail RUE Deficits / Details: previous shoulder replacement a few years ago with limited shoulder FF at baseline now   Lower Extremity Assessment Lower Extremity Assessment: Defer to PT evaluation   Cervical / Trunk Assessment Cervical / Trunk Assessment: Back Surgery   Communication Communication Communication: No difficulties   Cognition Arousal/Alertness: Awake/alert Behavior During Therapy: WFL for tasks assessed/performed Overall Cognitive Status: Within Functional Limits for tasks assessed                                       General Comments  Pt on RA throughout session, SpO2 was 92% with good pleth    Exercises     Shoulder Instructions      Home Living Family/patient expects to be discharged to:: Private residence Living Arrangements: Spouse/significant other Available Help at Discharge: Family;Available 24 hours/day Type of Home: House Home Access: Stairs to enter [employer] of Steps: 2 to porch, then one into house Entrance Stairs-Rails:  Left;Right (uses left) Home Layout: One level     Bathroom Shower/Tub: Tub/shower unit;Walk-in shower   Bathroom Toilet: Handicapped height Bathroom Accessibility: Yes How Accessible: Accessible via walker Home Equipment: Rolling Walker (2 wheels);Cane - single point;Shower seat - built in;Hand held shower head;Wheelchair - manual   Additional Comments: pt reports they have a built in bench in her shower, it is too high, she has a shower seat she can use instead. Husband can assist physically if needed. Son and daughter in law are next door and assit with IADLs      Prior Functioning/Environment Prior Level of Function : Needs assist       Physical Assist : ADLs (physical)   ADLs (physical): Dressing Mobility Comments: has a WC but typically uses RW ADLs Comments: not uncommon for her husband to assist with socks and shoes while she is in her recliner        OT Problem List: Decreased activity tolerance;Impaired balance (sitting and/or standing);Decreased knowledge of precautions;Obesity      OT Treatment/Interventions: Self-care/ADL training;Energy conservation;DME and/or AE instruction;Therapeutic activities;Patient/family education;Balance training    OT Goals(Current goals can be found in the care plan section) Acute Rehab OT Goals Patient Stated Goal: to get to go to her grandson's wedding on Jan 7th OT Goal Formulation: With patient Time For Goal Achievement: 10/20/21 Potential to Achieve Goals:  Good ADL Goals Pt Will Perform Grooming: with supervision;standing Pt Will Perform Upper Body Dressing: with set-up;sitting Pt Will Perform Lower Body Dressing: with min assist;sit to/from stand Pt Will Transfer to Toilet: with supervision;ambulating Pt Will Perform Toileting - Clothing Manipulation and hygiene: with min assist;sit to/from stand Additional ADL Goal #1: Pt will recall and maintain back precautions during ADL routine with 1 or less cues  OT Frequency: Min  2X/week   Barriers to D/C:            Co-evaluation              AM-PAC OT "6 Clicks" Daily Activity     Outcome Measure Help from another person eating meals?: None Help from another person taking care of personal grooming?: A Little Help from another person toileting, which includes using toliet, bedpan, or urinal?: A Lot Help from another person bathing (including washing, rinsing, drying)?: A Lot Help from another person to put on and taking off regular upper body clothing?: A Little Help from another person to put on and taking off regular lower body clothing?: A Lot 6 Click Score: 16   End of Session Equipment Utilized During Treatment: Gait belt;Rolling walker (2 wheels) Nurse Communication: Mobility status;Other (comment) (no O2 and no chair alarm)  Activity Tolerance: Patient tolerated treatment well Patient left: in chair;with call bell/phone within reach  OT Visit Diagnosis: Unsteadiness on feet (R26.81);Other abnormalities of gait and mobility (R26.89);Repeated falls (R29.6);Muscle weakness (generalized) (M62.81);History of falling (Z91.81)                Time: 4970-2637 OT Time Calculation (min): 24 min Charges:  OT General Charges $OT Visit: 1 Visit OT Evaluation $OT Eval Moderate Complexity: 1 Mod OT Treatments $Self Care/Home Management : 8-22 mins  Nyoka Cowden OTR/L Acute Rehabilitation Services Pager: [phone] Office: [phone]  Evern Bio Jovanny Stephanie 10/06/2021, 1:05 PM

## 2021-10-06 NOTE — Progress Notes (Signed)
Patient ID: [name], female   DOB: 08-13-1946, 75 y.o.   MRN: [id number] Vital signs are stable Patient is a little bit anemic with a hematocrit of 29 however she is only about 34 preoperatively Incision is clean and dry Patient is ambulated about 100 feet today We will continue to mobilize and restart her anticoagulant tonight. As she is eager to to be able to go home If she progresses with therapy hopefully this will be possible towards the end of the week.

## 2021-10-07 ENCOUNTER — Other Ambulatory Visit: Payer: Self-pay

## 2021-10-07 LAB — CBC
HCT: 28.7 % — ABNORMAL LOW (ref 36.0–46.0)
Hemoglobin: 9.2 g/dL — ABNORMAL LOW (ref 12.0–15.0)
MCH: 29.1 pg (ref 26.0–34.0)
MCHC: 32.1 g/dL (ref 30.0–36.0)
MCV: 90.8 fL (ref 80.0–100.0)
Platelets: 181 10*3/uL (ref 150–400)
RBC: 3.16 MIL/uL — ABNORMAL LOW (ref 3.87–5.11)
RDW: 12.9 % (ref 11.5–15.5)
WBC: 8.9 10*3/uL (ref 4.0–10.5)
nRBC: 0 % (ref 0.0–0.2)

## 2021-10-07 NOTE — Progress Notes (Signed)
Physical Therapy Treatment Patient Details Name: BASSY FETTERLY MRN: [id number] DOB: 01-27-1946 Today's Date: 10/07/2021   History of Present Illness Ms. Ahuva Poynor is a 75 year old individual admitted 10/01/21 after a fall from standing height she was on an elevated platform having a dress fitted.  She sustained a T7-T8 fracture dislocation and she has had a previous acrylic balloon kyphoplasty in the T7 vertebrae for compression fracture that was not healing. On 10/05/21 s/p Posterior fixation from T6-T9 of T7-T8 fracture dislocation posterior arthrodesis with allograft T6-T9. PMH includes but not limited to: 14 back surgeries, bil TKA, DVT, PE.    PT Comments    Pt reporting moderate back pain with mobility today, still tolerated good hallway distance ambulation. Pt requires the most support and cuing for bed mobility at this time, and per pt she sleeps in a recliner so this will not be an issue at home. Pt needs to progress gait distance and practice steps prior to d/c home. Will continue to follow.   Of note, pt dyspneic during gait, SPO2 87-92% on RA during gait when pleth is accurate. 2LO2 reapplied at end of session.    Recommendations for follow up therapy are one component of a multi-disciplinary discharge planning process, led by the attending physician.  Recommendations may be updated based on patient status, additional functional criteria and insurance authorization.  Follow Up Recommendations  Home health PT     Assistance Recommended at Discharge Frequent or constant Supervision/Assistance  Equipment Recommendations  None recommended by PT    Recommendations for Other Services       Precautions / Restrictions Precautions Precautions: Fall;Back Precaution Booklet Issued: Yes (comment) Precaution Comments: watch O2 Required Braces or Orthoses: Other Brace Other Brace: No brace needed     Mobility  Bed Mobility Overal bed mobility: Needs Assistance Bed  Mobility: Sidelying to Sit;Rolling Rolling: Mod assist Sidelying to sit: Mod assist;HOB elevated       General bed mobility comments: assist for log roll to L, trunk elevation off bed, and scooting to EOB. Increased time and effort.    Transfers Overall transfer level: Needs assistance Equipment used: Rolling walker (2 wheels) Transfers: Sit to/from Stand Sit to Stand: Min assist;From elevated surface           General transfer comment: boost assist, STS x2 from EOB and toilet.    Ambulation/Gait Ambulation/Gait assistance: Min guard Gait Distance (Feet): 100 Feet Assistive device: Rolling walker (2 wheels) Gait Pattern/deviations: Decreased stride length;Step-through pattern Gait velocity: decreased     General Gait Details: close guard for safety, verbal cuing for upright posture, placement in RW   Stairs             Wheelchair Mobility    Modified Rankin (Stroke Patients Only)       Balance Overall balance assessment: Needs assistance Sitting-balance support: Feet supported Sitting balance-Leahy Scale: Good     Standing balance support: Bilateral upper extremity supported;Reliant on assistive device for balance Standing balance-Leahy Scale: Poor Standing balance comment: requiring RW                            Cognition Arousal/Alertness: Awake/alert Behavior During Therapy: WFL for tasks assessed/performed Overall Cognitive Status: Within Functional Limits for tasks assessed  Exercises      General Comments        Pertinent Vitals/Pain Pain Assessment: Faces Faces Pain Scale: Hurts even more Pain Location: back incision and L hip Pain Descriptors / Indicators: Grimacing;Discomfort;Sore Pain Intervention(s): Limited activity within patient's tolerance;Monitored during session;Repositioned    Home Living                          Prior Function             PT Goals (current goals can now be found in the care plan section) Acute Rehab PT Goals Patient Stated Goal: return home - states "been through this before, I want to go home"; go to grandsons wedding Jan 7th PT Goal Formulation: With patient/family Time For Goal Achievement: 10/20/21 Potential to Achieve Goals: Good Progress towards PT goals: Progressing toward goals    Frequency    Min 5X/week      PT Plan Current plan remains appropriate    Co-evaluation              AM-PAC PT "6 Clicks" Mobility   Outcome Measure  Help needed turning from your back to your side while in a flat bed without using bedrails?: A Lot Help needed moving from lying on your back to sitting on the side of a flat bed without using bedrails?: A Lot Help needed moving to and from a bed to a chair (including a wheelchair)?: A Little Help needed standing up from a chair using your arms (e.g., wheelchair or bedside chair)?: A Little Help needed to walk in hospital room?: A Little Help needed climbing 3-5 steps with a railing? : A Lot 6 Click Score: 15    End of Session   Activity Tolerance: Patient limited by fatigue Patient left: with call bell/phone within reach;in chair;with family/visitor present (pt states she will press call button and wait for assist prior to mobilizing back to bed) Nurse Communication: Mobility status PT Visit Diagnosis: Other abnormalities of gait and mobility (R26.89);Muscle weakness (generalized) (M62.81)     Time: 8413-2440 PT Time Calculation (min) (ACUTE ONLY): 23 min  Charges:  $Gait Training: 8-22 mins $Therapeutic Activity: 8-22 mins                    Marye Round, PT DPT Acute Rehabilitation Services Pager [phone]  Office [phone]    Tyrone Apple E Christain [place] 10/07/2021, 4:12 PM

## 2021-10-07 NOTE — Progress Notes (Signed)
Patient ID: [name], female   DOB: 1946/08/30, 75 y.o.   MRN: [id number] Vital signs are stable Motor function appears to be doing good Biggest problem is her back pain and left hip pain We will see how she mobilized with physical therapy She is motivated to get home Will likely need home health PT OT Look for recommendations from those services

## 2021-10-08 MED ORDER — HYDROMORPHONE HCL 2 MG PO TABS
2.0000 mg | ORAL_TABLET | ORAL | Status: DC | PRN
  Administered 2021-10-08 – 2021-10-09 (×4): 2 mg via ORAL
  Filled 2021-10-08 (×6): qty 1

## 2021-10-08 NOTE — TOC Initial Note (Signed)
Transition of Care (TOC) - Initial/Assessment Note  Marvetta Gibbons RN,BSN Transitions of Care Unit 4NP (Non Trauma)- RN Case Manager See Treatment Team for direct Phone #    Patient Details  Name: [name] MRN: [id number] Date of Birth: 30-Oct-1945  Transition of Care [facility]) CM/SW Contact:    Dawayne Patricia, RN Phone Number: 10/08/2021, 4:24 PM  Clinical Narrative:                 Pt s/p fall with surgical repair for Posterior fixation from T6-T9 of T7-T8 fracture, from home w/ spouse.  Noted orders placed for HHPT/OT.  CM in to speak with pt at bedside regarding transition of care needs.  Per conversation with pt, she has needed DME at home, pt agreeable to [facility] services. List provided for [facility] choice Per CMS guidelines from medicare.gov website with star ratings (copy placed in shadow chart). After review pt has selected [facility] as first choice, with [facility] as backup.   Address, phone #s and PCP all confirmed with pt in epic. Per pt she will have transportation home.   [facility] for [facility] referral- referral pending review.   Expected Discharge Plan: [place] Barriers to Discharge: Continued Medical Work up   Patient Goals and CMS Choice Patient states their goals for this hospitalization and ongoing recovery are:: return home CMS Medicare.gov Compare Post Acute Care list provided to:: Patient Choice offered to / list presented to : Patient  Expected Discharge Plan and Services Expected Discharge Plan: [place]   Discharge Planning Services: CM Consult Post Acute Care Choice: [place] arrangements for the past 2 months: Single Family Home                 DME Arranged: N/A DME Agency: NA       HH Arranged: PT, OT [place] Agency: [place] ([place]) Date HH Agency Contacted: 10/08/21 Time [place]: 1624 Representative spoke with at [place]: [place]  Arrangements/Services Living arrangements for the past 2 months: [place] with:: Spouse, Self Patient language and need for interpreter reviewed:: Yes Do you feel safe going back to the place where you live?: Yes      Need for Family Participation in Patient Care: Yes (Comment) Care giver support system in place?: Yes (comment) Current home services: DME Criminal Activity/Legal Involvement Pertinent to Current Situation/Hospitalization: No - Comment as needed  Activities of Daily Living Home Assistive Devices/Equipment: Gilford Rile (specify type) ADL Screening (condition at time of admission) Patient's cognitive ability adequate to safely complete daily activities?: Yes Is the patient deaf or have difficulty hearing?: No Does the patient have difficulty seeing, even when wearing glasses/contacts?: No Does the patient have difficulty concentrating, remembering, or making decisions?: No Patient able to express need for assistance with ADLs?: Yes Does the patient have difficulty dressing or bathing?: Yes Independently performs ADLs?: No Communication: Independent Dressing (OT): Needs assistance Is this a change from baseline?: Pre-admission baseline Grooming: Needs assistance Is this a change from baseline?: Change from baseline, expected to last >3 days Feeding: Needs assistance Is this a change from baseline?: Change from baseline, expected to last >3 days Bathing: Needs assistance Is this a change from baseline?: Pre-admission baseline Toileting: Needs assistance Is this a change from baseline?: Change from baseline, expected to last >3days In/Out Bed: Needs assistance Is this a change from baseline?: Change from baseline, expected to  last >3 days Walks in Home: Needs assistance Is this a change from baseline?: Change from baseline, expected to last >3 days Does the patient have difficulty walking or climbing stairs?: Yes Weakness of Legs: Both Weakness of Arms/Hands:  None  Permission Sought/Granted Permission sought to share information with : Facility Industrial/product designer granted to share information with : Yes, Verbal Permission Granted     Permission granted to share info w AGENCY: HH        Emotional Assessment Appearance:: Appears stated age Attitude/Demeanor/Rapport: Engaged Affect (typically observed): Accepting, Appropriate, Pleasant Orientation: : Oriented to Self, Oriented to Place, Oriented to  Time, Oriented to Situation Alcohol / Substance Use: Not Applicable Psych Involvement: No (comment)  Admission diagnosis:  Thoracic spine instability [M53.2X4] Fall, initial encounter [W19.XXXA] Patient Active Problem List   Diagnosis Date Noted   Thoracic spine instability 10/01/2021   Thoracic spine pain 04/17/2021   Thoracic radiculopathy 04/17/2021   Compression fracture of body of thoracic vertebra (HCC) 03/03/2021   Coronary artery disease involving native coronary artery 12/12/2018   Chest pain 08/18/2018   Essential hypertension 08/18/2018   Rupture of quadriceps tendon 12/06/2016   Chronic kidney disease, stage II (mild) 07/27/2016   Hiatal hernia 07/27/2016   Hypothyroidism 07/27/2016   Hyperlipidemia 07/27/2016   History of DVT (deep vein thrombosis) 07/27/2016   Recurrent pulmonary emboli (HCC) 07/27/2016   Chronic anticoagulation 07/27/2016   Chronic lower back pain 07/27/2016   Arthrosis of left acromioclavicular joint 03/31/2015   Biceps tendonitis on left 03/31/2015   Chronic right shoulder pain 03/31/2015   Left shoulder pain 03/31/2015   S/P left hip revision 12/07/2013   Visit for suture removal 09/25/2013   Aneurysm of unspecified site (HCC) 08/28/2013   PCP:  Creola Corn, MD Pharmacy:   [id number] - [place], [place] - [address] [place] [address] Phone: [phone] Fax: [phone]     Social Determinants of Health (SDOH)  Interventions    Readmission Risk Interventions No flowsheet data found.

## 2021-10-08 NOTE — Progress Notes (Signed)
Occupational Therapy Treatment Patient Details Name: [name] MRN: [id number] DOB: January 04, 1946 Today's Date: 10/08/2021   History of present illness [name] is a 75 year old individual admitted 10/01/21 after a fall from standing height she was on an elevated platform having a dress fitted.  She sustained a T7-T8 fracture dislocation and she has had a previous acrylic balloon kyphoplasty in the T7 vertebrae for compression fracture that was not healing. On 10/05/21 s/p Posterior fixation from T6-T9 of T7-T8 fracture dislocation posterior arthrodesis with allograft T6-T9. PMH includes but not limited to: 14 back surgeries, bil TKA, DVT, PE.   OT comments  Patient with good progress toward patient focused goals.  Able to roll with cues and supervision, basic bed mobility with Min A and use of side rails with HOB elevated.  Able to transition to recliner with expressed discomfort and Min guard with RW.  Deficits surround pain from injuries.  OT continues to follow, but home with assist as needed from the spouse and [facility] is appropriate.  Patient appears to have the needed DME.     Recommendations for follow up therapy are one component of a multi-disciplinary discharge planning process, led by the attending physician.  Recommendations may be updated based on patient status, additional functional criteria and insurance authorization.    Follow Up Recommendations  Home health OT    Assistance Recommended at Discharge Frequent or constant Supervision/Assistance  Equipment Recommendations  Tub/shower bench    Recommendations for Other Services      Precautions / Restrictions Precautions Precautions: Fall;Back Other Brace: No brace needed Restrictions Weight Bearing Restrictions: No       Mobility Bed Mobility Overal bed mobility: Needs Assistance Bed Mobility: Rolling;Sidelying to Sit Rolling: Supervision Sidelying to sit: HOB elevated;Supervision             Transfers Overall transfer level: Needs assistance Equipment used: Rolling walker (2 wheels) Transfers: Bed to chair/wheelchair/BSC;Sit to/from Stand Sit to Stand: Min assist;From elevated surface   Step pivot transfers: Min guard             Balance Overall balance assessment: Needs assistance Sitting-balance support: Feet supported Sitting balance-Leahy Scale: Good     Standing balance support: Bilateral upper extremity supported;Reliant on assistive device for balance Standing balance-Leahy Scale: Fair                             ADL either performed or assessed with clinical judgement   ADL       Grooming: Wash/dry hands;Wash/dry face;Set up;Sitting           Upper Body Dressing : Minimal assistance;Sitting   Lower Body Dressing: Moderate assistance;Sit to/from stand   Toilet Transfer: Ambulation;Rolling walker (2 wheels);Requires wide/bariatric;Minimal assistance           Functional mobility during ADLs: Minimal assistance;Rolling walker (2 wheels)      Extremity/Trunk Assessment Upper Extremity Assessment RUE Deficits / Details: previous shoulder replacement a few years ago with limited shoulder FF at baseline now  Pertinent Vitals/ Pain       Faces Pain Scale: Hurts even more Pain Location: back incision and L hip Pain Descriptors / Indicators: Grimacing;Discomfort;Sore Pain Intervention(s): Monitored during session;Premedicated before session                                                          Frequency  Min 2X/week        Progress Toward Goals  OT Goals(current goals can now be found in the care plan section)  Progress towards OT goals: Progressing toward goals  Acute Rehab OT Goals OT Goal Formulation: With patient Time For Goal Achievement:  10/20/21 Potential to Achieve Goals: Good  Plan      Co-evaluation                 AM-PAC OT "6 Clicks" Daily Activity     Outcome Measure   Help from another person eating meals?: None Help from another person taking care of personal grooming?: A Little Help from another person toileting, which includes using toliet, bedpan, or urinal?: A Lot Help from another person bathing (including washing, rinsing, drying)?: A Lot Help from another person to put on and taking off regular upper body clothing?: A Little Help from another person to put on and taking off regular lower body clothing?: A Lot 6 Click Score: 16    End of Session Equipment Utilized During Treatment: Gait belt;Rolling walker (2 wheels)  OT Visit Diagnosis: Unsteadiness on feet (R26.81);Other abnormalities of gait and mobility (R26.89);Repeated falls (R29.6);Muscle weakness (generalized) (M62.81);History of falling (Z91.81)   Activity Tolerance Patient tolerated treatment well   Patient Left in chair;with call bell/phone within reach   Nurse Communication Mobility status;Other (comment)        Time: 1696-7893 OT Time Calculation (min): 24 min  Charges: OT General Charges $OT Visit: 1 Visit OT Treatments $Self Care/Home Management : 23-37 mins  10/08/2021  RP, OTR/L  Acute Rehabilitation Services  Office:  [phone]   Suzanna Obey 10/08/2021, 10:16 AM

## 2021-10-08 NOTE — Plan of Care (Signed)

## 2021-10-08 NOTE — Progress Notes (Addendum)
Patient ID: [name], female   DOB: 1946-01-07, 75 y.o.   MRN: [id number] Vital signs are stable Patient's ambulatory status is quite limited Pain also difficult to control.  Has been receiving parenteral Dilaudid.  Hydrocodone does not seem to be very effective.  I will try to change her over to oral Dilaudid.  Patient is adamant about wanting to go home.  She will require home health PT and OT.  We need to make sure she is comfortable on oral analgesics.  We will see how she does with oral Dilaudid.  Of note patient was concerned of sternal and rib fractures.  I noted that she does not have sternal or rib fractures.  She had a fracture dislocation of the T7-T8 vertebrae.  The ribs do not appear to be involved.  Apparently she was informed of this by some of her therapists and I had confronted the nurse to inform them that there is no evidence of rib or sternal fractures.

## 2021-10-08 NOTE — Progress Notes (Signed)
Physical Therapy Treatment Patient Details Name: [name] MRN: [id number] DOB: 12-Feb-1946 Today's Date: 10/08/2021   History of Present Illness [name] is a 75 year old individual admitted 10/01/21 after a fall from standing height she was on an elevated platform having a dress fitted.  She sustained a T7-T8 fracture dislocation and she has had a previous acrylic balloon kyphoplasty in the T7 vertebrae for compression fracture that was not healing. On 10/05/21 s/p Posterior fixation from T6-T9 of T7-T8 fracture dislocation posterior arthrodesis with allograft T6-T9. PMH includes but not limited to: 14 back surgeries, bil TKA, DVT, PE.    PT Comments    Pt on toilet upon PT arrival to room, assisted off of toilet but pt able to perform her own hygiene. Pt ambulatory in hallway today, requiring increased time and light steadying assist. Pt complaining of back and L hip pain throughout mobility. Of note, pt with SpO2 reading of 82% with good pleth post-gait, when sPO2 sensor changed pt with reading of 95%. Pt would benefit from a walking sat test prior to d/c. Will continue to follow, needs to practice steps tomorrow prior to d/c.     Recommendations for follow up therapy are one component of a multi-disciplinary discharge planning process, led by the attending physician.  Recommendations may be updated based on patient status, additional functional criteria and insurance authorization.  Follow Up Recommendations  Home health PT     Assistance Recommended at Discharge Frequent or constant Supervision/Assistance  Equipment Recommendations  None recommended by PT    Recommendations for Other Services       Precautions / Restrictions Precautions Precautions: Fall;Back Precaution Booklet Issued: Yes (comment) Precaution Comments: watch O2 Required Braces or Orthoses: Other Brace Other Brace: No brace needed Restrictions Weight Bearing Restrictions: No     Mobility   Bed Mobility Overal bed mobility: Needs Assistance Bed Mobility: Rolling;Sit to Sidelying Rolling: Min assist       Sit to sidelying: Min assist;HOB elevated General bed mobility comments: assist for LE lifting into bed, roll onto back, repositioning.    Transfers Overall transfer level: Needs assistance Equipment used: Rolling walker (2 wheels) Transfers: Bed to chair/wheelchair/BSC;Sit to/from Stand Sit to Stand: Min assist;From elevated surface           General transfer comment: light boost assist to rise to standing    Ambulation/Gait Ambulation/Gait assistance: Min assist Gait Distance (Feet): 120 Feet Assistive device: Rolling walker (2 wheels) Gait Pattern/deviations: Decreased stride length;Step-through pattern;Staggering left;Antalgic Gait velocity: decr     General Gait Details: assist to steady, cues for upright posture, placement in RW, correcting veer L in hallway. Pt with worsening antalgic gait with further distance due to L hip pain   Stairs             Wheelchair Mobility    Modified Rankin (Stroke Patients Only)       Balance Overall balance assessment: Needs assistance Sitting-balance support: Feet supported Sitting balance-Leahy Scale: Good     Standing balance support: Bilateral upper extremity supported;Reliant on assistive device for balance Standing balance-Leahy Scale: Fair                              Cognition Arousal/Alertness: Awake/alert Behavior During Therapy: WFL for tasks assessed/performed Overall Cognitive Status: Within Functional Limits for tasks assessed  Exercises      General Comments        Pertinent Vitals/Pain Pain Assessment: Faces Faces Pain Scale: Hurts even more Pain Location: back incision and L hip Pain Descriptors / Indicators: Grimacing;Discomfort;Sore Pain Intervention(s): Limited activity within patient's  tolerance;Monitored during session;Repositioned    Home Living                          Prior Function            PT Goals (current goals can now be found in the care plan section) Acute Rehab PT Goals Patient Stated Goal: return home - states "been through this before, I want to go home"; go to grandsons wedding Jan 7th PT Goal Formulation: With patient/family Time For Goal Achievement: 10/20/21 Potential to Achieve Goals: Good Progress towards PT goals: Progressing toward goals    Frequency    Min 5X/week      PT Plan Current plan remains appropriate    Co-evaluation              AM-PAC PT "6 Clicks" Mobility   Outcome Measure  Help needed turning from your back to your side while in a flat bed without using bedrails?: A Little Help needed moving from lying on your back to sitting on the side of a flat bed without using bedrails?: A Lot Help needed moving to and from a bed to a chair (including a wheelchair)?: A Little Help needed standing up from a chair using your arms (e.g., wheelchair or bedside chair)?: A Little Help needed to walk in hospital room?: A Little Help needed climbing 3-5 steps with a railing? : A Lot 6 Click Score: 16    End of Session   Activity Tolerance: Patient limited by fatigue Patient left: with call bell/phone within reach;in chair;with family/visitor present (pt states she will press call button and wait for assist prior to mobilizing back to bed) Nurse Communication: Mobility status PT Visit Diagnosis: Other abnormalities of gait and mobility (R26.89);Muscle weakness (generalized) (M62.81)     Time: 3151-7616 PT Time Calculation (min) (ACUTE ONLY): 25 min  Charges:  $Gait Training: 8-22 mins                     Marye Round, PT DPT Acute Rehabilitation Services Pager [phone]  Office [phone]    Tyrone Apple E Christain [place] 10/08/2021, 3:15 PM

## 2021-10-09 MED ORDER — HYDROCODONE-ACETAMINOPHEN 10-325 MG PO TABS
1.0000 | ORAL_TABLET | ORAL | Status: DC | PRN
  Administered 2021-10-09 (×2): 1 via ORAL
  Filled 2021-10-09 (×2): qty 1

## 2021-10-09 MED ORDER — HYDROCODONE-ACETAMINOPHEN 10-325 MG PO TABS: 1.0000 | ORAL_TABLET | ORAL | 0 refills | Status: DC | PRN

## 2021-10-09 NOTE — Discharge Summary (Addendum)
Physician Discharge Summary  Patient ID: [name] MRN: [id number] DOB/AGE: 75-Nov-1947 75 y.o.  Admit date: 10/01/2021 Discharge date: 10/09/2021  Admission Diagnoses: Thoracic fracture dislocation T7-T8  Discharge Diagnoses: Fracture dislocation of thoracic vertebrae T7-T8 with instability. Principal Problem:   Thoracic spine instability   Discharged Condition: fair  Hospital Course: Patient was admitted after sustaining a fall from standing height.  She had a fracture dislocation of all 3 columns from T7-T8.  She was neurologically intact.  She had been on anticoagulation with Xarelto and this was stopped and the patient was taken to the operating room on 19 December to undergo surgical stabilization of T7-T8 with fixation from T6-T9.  She tolerated surgery well.  Pain control has been an issue.  Patient mobility has been an issue.  She is improving.  Consults: None  Significant Diagnostic Studies: None  Treatments: surgery: See op note  Discharge Exam: Blood pressure 120/67, pulse 73, temperature 98.5 F (36.9 C), temperature source Oral, resp. rate 19, height 5\' 3"  (1.6 m), weight 117.9 kg, SpO2 100 %. Incision is clean and dry.  Motor function reveals strength of 4 out of 5 in iliopsoas quad tibialis anterior and gastroc.  Sensation is intact.  Disposition: Discharge disposition: 01-Home or Self Care       Discharge Instructions     Call MD for:  redness, tenderness, or signs of infection (pain, swelling, redness, odor or green/yellow discharge around incision site)   Complete by: As directed    Call MD for:  severe uncontrolled pain   Complete by: As directed    Call MD for:  temperature >100.4   Complete by: As directed    Diet - low sodium heart healthy   Complete by: As directed    Discharge wound care:   Complete by: As directed    Okay to shower. Do not apply salves or appointments to incision. No heavy lifting with the upper extremities greater than  10 pounds. May resume driving when not requiring pain medication and patient feels comfortable with doing so.   Incentive spirometry RT   Complete by: As directed    Increase activity slowly   Complete by: As directed       Allergies as of 10/09/2021       Reactions   Celecoxib Rash        Medication List     STOP taking these medications    HYDROcodone-acetaminophen 5-325 MG tablet Commonly known as: NORCO/VICODIN Replaced by: HYDROcodone-acetaminophen 10-325 MG tablet       TAKE these medications    albuterol 108 (90 Base) MCG/ACT inhaler Commonly known as: VENTOLIN HFA Inhale 2 puffs into the lungs every 6 (six) hours as needed for wheezing or shortness of breath.   ALPRAZolam 0.5 MG tablet Commonly known as: XANAX Take 0.5 mg by mouth 2 (two) times daily.   amLODipine 5 MG tablet Commonly known as: NORVASC Take 2.5 mg by mouth at bedtime.   atenolol 25 MG tablet Commonly known as: TENORMIN Take 25 mg by mouth See admin instructions. Take one tablet (25 mg) by mouth every morning, may also take a 2nd tablet (25 mg) if SBP >150   10/11/2021 Dream Arthritis 0.025 % Crea Generic drug: Histamine Dihydrochloride Apply 1 application topically 4 (four) times daily as needed (spasms/joint pain.).   baclofen 10 MG tablet Commonly known as: LIORESAL Take 1 tablet (10 mg total) by mouth every 8 (eight) hours.   CALCIUM 500 + D PO  Take 2 tablets by mouth in the morning.   escitalopram 5 MG tablet Commonly known as: LEXAPRO Take 5 mg by mouth at bedtime.   famotidine 40 MG tablet Commonly known as: PEPCID Take 40 mg by mouth at bedtime.   fluticasone 50 MCG/ACT nasal spray Commonly known as: FLONASE Place 1 spray into both nostrils daily as needed for allergies or rhinitis.   furosemide 20 MG tablet Commonly known as: LASIX Take 20 mg by mouth every Monday, Tuesday, Wednesday, Thursday, and Friday. Skip Saturday and Sunday   gabapentin 600 MG  tablet Commonly known as: NEURONTIN Take 600 mg by mouth 3 (three) times daily.   HYDROcodone-acetaminophen 10-325 MG tablet Commonly known as: NORCO Take 1 tablet by mouth every 3 (three) hours as needed for moderate pain or severe pain. Replaces: HYDROcodone-acetaminophen 5-325 MG tablet   levothyroxine 50 MCG tablet Commonly known as: SYNTHROID Take 25 mcg by mouth every Monday, Wednesday, and Friday. Skip Sunday, Tuesday, Thursday, Saturday   losartan 50 MG tablet Commonly known as: COZAAR Take 50 mg by mouth 2 (two) times daily.   MULTI + OMEGA-3 ADULT GUMMIES PO Take 2 tablets by mouth in the morning.   ondansetron 4 MG disintegrating tablet Commonly known as: ZOFRAN-ODT Take 4 mg by mouth 2 (two) times daily as needed for nausea or vomiting.   PROBIOTIC-PREBIOTIC PO Take 1 tablet by mouth in the morning.   rivaroxaban 20 MG Tabs tablet Commonly known as: XARELTO Take 20 mg by mouth daily at 4 PM.   simvastatin 20 MG tablet Commonly known as: ZOCOR Take 20 mg by mouth at bedtime.   SYSTANE OP Place 1-2 drops into both eyes 2 (two) times daily as needed (for dry eyes).   Vitamin C 250 MG Chew Chew 500 mg by mouth daily.               Discharge Care Instructions  (From admission, onward)           Start     Ordered   10/09/21 0000  Discharge wound care:       Comments: Okay to shower. Do not apply salves or appointments to incision. No heavy lifting with the upper extremities greater than 10 pounds. May resume driving when not requiring pain medication and patient feels comfortable with doing so.   10/09/21 1429            Follow-up Information     [facility] Follow up.   Specialty: Home Health Services Why: Iantha Fallen)- HHPT/OT arranged-they will contact you to schedule home visits Contact information: [address] [place] [address] [phone]                 Signed: Stefani Dama 10/09/2021, 2:29  PM

## 2021-10-09 NOTE — Progress Notes (Signed)
Patient ID: [name], female   DOB: 26-Dec-1945, 75 y.o.   MRN: [id number] Vital signs are stable Patient seems very somnolent on Dilaudid She is also complaining of nausea Her bowels have not moved since she has been in the hospital for 7 days We will add some enemas today and see if we can get her bowels free Patient desires to go home but she notes that the Dilaudid does not seem to be serving her well.  I have advised that we switch her to hydrocodone 10 mg and see if this medication gives her better pain control.  We will see if we can get her discharged home tomorrow is her 59th anniversary with her husband.  She is eager to get back to the home place.

## 2021-10-09 NOTE — Progress Notes (Addendum)
PT Cancellation Note  Patient Details Name: [name] MRN: [id number] DOB: 11/27/1945   Cancelled Treatment:    Reason Eval/Treat Not Completed: Medical issues which prohibited therapy. Pt reports not sleeping well last night due to abdominal pain and now with unrelenting nausea this AM. Politely declining OOB x 2 attempts. Encouraged pt to get OOB with nursing if nausea subsides.  1136 addendum: Pt with continued c/o nausea and pain on 3rd attempt.   Ilda Foil 10/09/2021, 10:05 AM  Aida Raider, PT  Office # [phone] Pager [phone]

## 2021-10-09 NOTE — TOC Progression Note (Signed)
Transition of Care (TOC) - Progression Note  Donn Pierini RN,BSN Transitions of Care Unit 4NP (Non Trauma)- RN Case Manager See Treatment Team for direct Phone #    Patient Details  Name: [name] MRN: [id number] Date of Birth: [birth year]  Transition of Care [facility]) CM/SW Contact  Zenda Alpers Lenn Sink, RN Phone Number: 10/09/2021, 12:09 PM  Clinical Narrative:    Follow up done with Pearson Grippe at [facility] regarding referral for [facility] needs- per Pearson Grippe they are unable to accept at this time.  Call made to pt's backup choice- [facility]- spoke with Amy. Iantha Fallen is able to accept for HHPT/OT needs and will contact pt post discharge to schedule start of care visit.   TOC will follow for any further transition needs.    Expected Discharge Plan: Home w Home Health Services Barriers to Discharge: Continued Medical Work up  Expected Discharge Plan and Services Expected Discharge Plan: Home w Home Health Services   Discharge Planning Services: CM Consult Post Acute Care Choice: Home Health Living arrangements for the past 2 months: Single Family Home                 DME Arranged: N/A DME Agency: NA       HH Arranged: PT, OT HH Agency: [facility] Date HH Agency Contacted: 10/09/21 Time HH Agency Contacted: 1208 Representative spoke with at [facility] Agency: Amy   Social Determinants of Health (SDOH) Interventions    Readmission Risk Interventions No flowsheet data found.

## 2021-10-09 NOTE — Progress Notes (Signed)
Patient discharged on 12/23 on verbal order via on call neurosurgery nurse practitioner in relation to the stipulation to the patients discharge that Dr. Danielle Dess made saying patient could be discharged today after having a BM or tomorrow if no BM was made today. Discharge orders were placed in the chart. Instructions have been read to the patient and her husband and all belonging were taken with them out the room.  Mikki Harbor, RN

## 2021-10-14 MED FILL — Heparin Sodium (Porcine) Inj 1000 Unit/ML: INTRAMUSCULAR | Qty: 30 | Status: AC

## 2021-10-14 MED FILL — Sodium Chloride IV Soln 0.9%: INTRAVENOUS | Qty: 1000 | Status: AC

## 2021-10-28 DIAGNOSIS — M546 Pain in thoracic spine: Secondary | ICD-10-CM | POA: Diagnosis not present

## 2021-11-15 DIAGNOSIS — I700 Atherosclerosis of aorta: Secondary | ICD-10-CM | POA: Diagnosis not present

## 2021-11-15 DIAGNOSIS — M1990 Unspecified osteoarthritis, unspecified site: Secondary | ICD-10-CM | POA: Diagnosis not present

## 2021-11-15 DIAGNOSIS — E039 Hypothyroidism, unspecified: Secondary | ICD-10-CM | POA: Diagnosis not present

## 2021-11-15 DIAGNOSIS — I10 Essential (primary) hypertension: Secondary | ICD-10-CM | POA: Diagnosis not present

## 2021-11-27 DIAGNOSIS — S22009A Unspecified fracture of unspecified thoracic vertebra, initial encounter for closed fracture: Secondary | ICD-10-CM | POA: Diagnosis not present

## 2022-01-29 ENCOUNTER — Other Ambulatory Visit: Payer: Self-pay | Admitting: Obstetrics and Gynecology

## 2022-01-29 DIAGNOSIS — N6320 Unspecified lump in the left breast, unspecified quadrant: Secondary | ICD-10-CM

## 2022-02-01 DIAGNOSIS — N1832 Chronic kidney disease, stage 3b: Secondary | ICD-10-CM | POA: Diagnosis not present

## 2022-02-01 DIAGNOSIS — D649 Anemia, unspecified: Secondary | ICD-10-CM | POA: Diagnosis not present

## 2022-02-01 DIAGNOSIS — I350 Nonrheumatic aortic (valve) stenosis: Secondary | ICD-10-CM | POA: Diagnosis not present

## 2022-02-01 DIAGNOSIS — I129 Hypertensive chronic kidney disease with stage 1 through stage 4 chronic kidney disease, or unspecified chronic kidney disease: Secondary | ICD-10-CM | POA: Diagnosis not present

## 2022-02-01 DIAGNOSIS — R0602 Shortness of breath: Secondary | ICD-10-CM | POA: Diagnosis not present

## 2022-02-01 DIAGNOSIS — R600 Localized edema: Secondary | ICD-10-CM | POA: Diagnosis not present

## 2022-02-02 DIAGNOSIS — D649 Anemia, unspecified: Secondary | ICD-10-CM | POA: Diagnosis not present

## 2022-02-04 ENCOUNTER — Other Ambulatory Visit (HOSPITAL_COMMUNITY): Payer: Self-pay | Admitting: Registered Nurse

## 2022-02-04 DIAGNOSIS — I350 Nonrheumatic aortic (valve) stenosis: Secondary | ICD-10-CM

## 2022-02-04 DIAGNOSIS — I129 Hypertensive chronic kidney disease with stage 1 through stage 4 chronic kidney disease, or unspecified chronic kidney disease: Secondary | ICD-10-CM

## 2022-02-05 DIAGNOSIS — I350 Nonrheumatic aortic (valve) stenosis: Secondary | ICD-10-CM | POA: Diagnosis not present

## 2022-02-05 DIAGNOSIS — D649 Anemia, unspecified: Secondary | ICD-10-CM | POA: Diagnosis not present

## 2022-02-05 DIAGNOSIS — R0602 Shortness of breath: Secondary | ICD-10-CM | POA: Diagnosis not present

## 2022-02-05 DIAGNOSIS — I129 Hypertensive chronic kidney disease with stage 1 through stage 4 chronic kidney disease, or unspecified chronic kidney disease: Secondary | ICD-10-CM | POA: Diagnosis not present

## 2022-02-05 DIAGNOSIS — N1832 Chronic kidney disease, stage 3b: Secondary | ICD-10-CM | POA: Diagnosis not present

## 2022-02-05 DIAGNOSIS — R600 Localized edema: Secondary | ICD-10-CM | POA: Diagnosis not present

## 2022-02-10 DIAGNOSIS — J3089 Other allergic rhinitis: Secondary | ICD-10-CM | POA: Diagnosis not present

## 2022-02-10 DIAGNOSIS — D649 Anemia, unspecified: Secondary | ICD-10-CM | POA: Diagnosis not present

## 2022-02-10 DIAGNOSIS — R600 Localized edema: Secondary | ICD-10-CM | POA: Diagnosis not present

## 2022-02-10 DIAGNOSIS — I129 Hypertensive chronic kidney disease with stage 1 through stage 4 chronic kidney disease, or unspecified chronic kidney disease: Secondary | ICD-10-CM | POA: Diagnosis not present

## 2022-02-10 DIAGNOSIS — N1832 Chronic kidney disease, stage 3b: Secondary | ICD-10-CM | POA: Diagnosis not present

## 2022-02-10 DIAGNOSIS — R0602 Shortness of breath: Secondary | ICD-10-CM | POA: Diagnosis not present

## 2022-02-10 DIAGNOSIS — I350 Nonrheumatic aortic (valve) stenosis: Secondary | ICD-10-CM | POA: Diagnosis not present

## 2022-02-16 DIAGNOSIS — Z86711 Personal history of pulmonary embolism: Secondary | ICD-10-CM | POA: Diagnosis not present

## 2022-02-16 DIAGNOSIS — D649 Anemia, unspecified: Secondary | ICD-10-CM | POA: Diagnosis not present

## 2022-02-16 DIAGNOSIS — I129 Hypertensive chronic kidney disease with stage 1 through stage 4 chronic kidney disease, or unspecified chronic kidney disease: Secondary | ICD-10-CM | POA: Diagnosis not present

## 2022-02-16 DIAGNOSIS — Z7901 Long term (current) use of anticoagulants: Secondary | ICD-10-CM | POA: Diagnosis not present

## 2022-02-16 DIAGNOSIS — I700 Atherosclerosis of aorta: Secondary | ICD-10-CM | POA: Diagnosis not present

## 2022-02-16 DIAGNOSIS — I350 Nonrheumatic aortic (valve) stenosis: Secondary | ICD-10-CM | POA: Diagnosis not present

## 2022-02-16 DIAGNOSIS — E781 Pure hyperglyceridemia: Secondary | ICD-10-CM | POA: Diagnosis not present

## 2022-02-16 DIAGNOSIS — F1120 Opioid dependence, uncomplicated: Secondary | ICD-10-CM | POA: Diagnosis not present

## 2022-02-16 DIAGNOSIS — N1832 Chronic kidney disease, stage 3b: Secondary | ICD-10-CM | POA: Diagnosis not present

## 2022-02-17 DIAGNOSIS — E039 Hypothyroidism, unspecified: Secondary | ICD-10-CM | POA: Diagnosis not present

## 2022-02-18 ENCOUNTER — Ambulatory Visit (HOSPITAL_COMMUNITY): Payer: PPO | Attending: Cardiology

## 2022-02-18 DIAGNOSIS — I129 Hypertensive chronic kidney disease with stage 1 through stage 4 chronic kidney disease, or unspecified chronic kidney disease: Secondary | ICD-10-CM | POA: Diagnosis not present

## 2022-02-18 DIAGNOSIS — I350 Nonrheumatic aortic (valve) stenosis: Secondary | ICD-10-CM | POA: Insufficient documentation

## 2022-02-18 LAB — ECHOCARDIOGRAM COMPLETE
AR max vel: 2.49 cm2
AV Area VTI: 2.48 cm2
AV Area mean vel: 2.44 cm2
AV Mean grad: 13 mmHg
AV Peak grad: 23.4 mmHg
Ao pk vel: 2.42 m/s
Area-P 1/2: 2.82 cm2
S' Lateral: 2.8 cm

## 2022-02-24 DIAGNOSIS — M546 Pain in thoracic spine: Secondary | ICD-10-CM | POA: Diagnosis not present

## 2022-02-25 ENCOUNTER — Ambulatory Visit
Admission: RE | Admit: 2022-02-25 | Discharge: 2022-02-25 | Disposition: A | Discharge status: Home / Self Care | Payer: PPO | Source: Ambulatory Visit | Attending: Obstetrics and Gynecology | Admitting: Obstetrics and Gynecology

## 2022-02-25 ENCOUNTER — Other Ambulatory Visit: Payer: Self-pay | Admitting: Obstetrics and Gynecology

## 2022-02-25 DIAGNOSIS — N6320 Unspecified lump in the left breast, unspecified quadrant: Secondary | ICD-10-CM

## 2022-02-25 DIAGNOSIS — N6322 Unspecified lump in the left breast, upper inner quadrant: Secondary | ICD-10-CM | POA: Diagnosis not present

## 2022-02-25 DIAGNOSIS — R928 Other abnormal and inconclusive findings on diagnostic imaging of breast: Secondary | ICD-10-CM | POA: Diagnosis not present

## 2022-02-25 DIAGNOSIS — N6321 Unspecified lump in the left breast, upper outer quadrant: Secondary | ICD-10-CM | POA: Diagnosis not present

## 2022-03-05 IMAGING — RF DG THORACIC SPINE 2V
1 series · 1 of 1 positions shown · non-contrast
Comparison: CT March 31, 2021.

CLINICAL DATA: T7-T8 decompression.

EXAM:
DG C-ARM 1-60 MIN; THORACIC SPINE 2 VIEWS
FLUOROSCOPY TIME:  Fluoroscopy Time:  17 seconds.
Radiation Exposure Index (if provided by the fluoroscopic device):
5.68 mGy.
Number of Acquired Spot Images: 1

[Series 1: run · 1 of 1 slices shown]
[im 1/1]
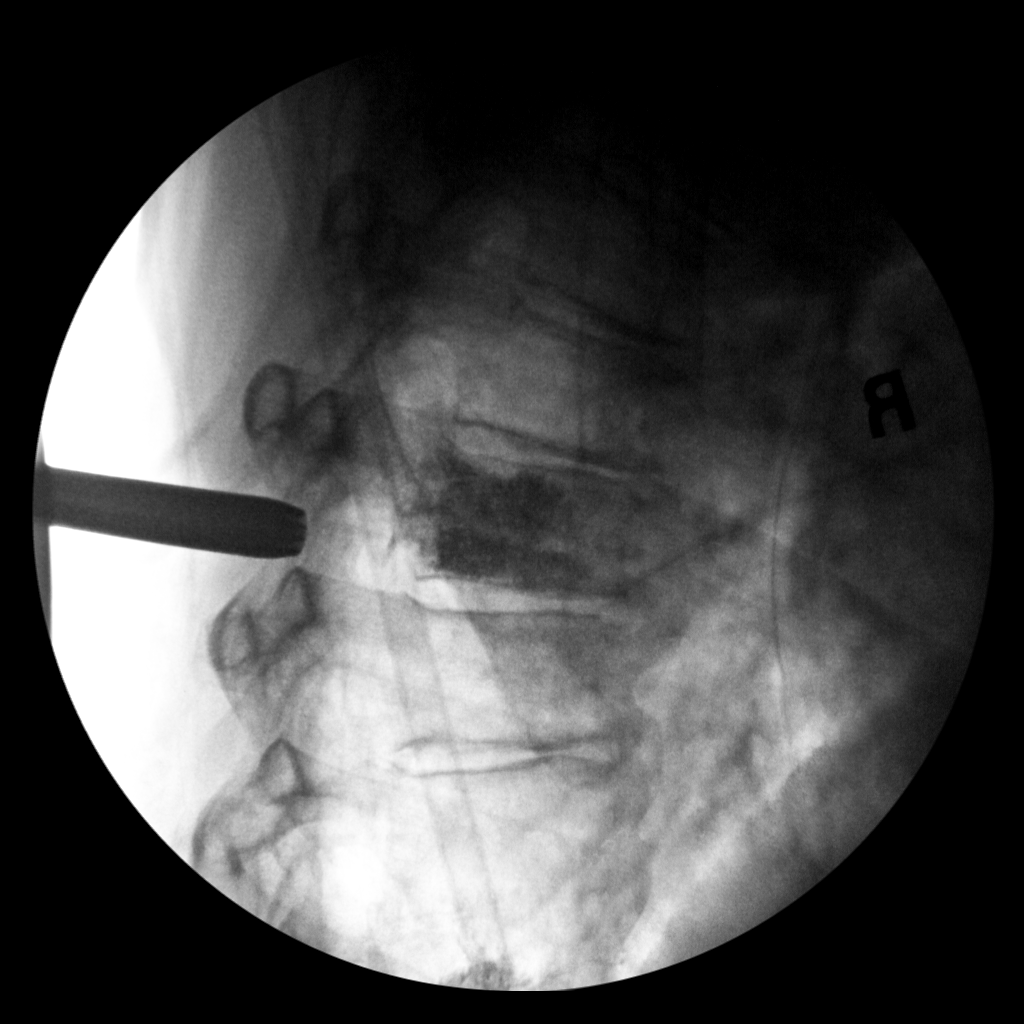

[1 of 1 positions shown; findings below may reference images not displayed]

FINDINGS: A single C-arm fluoroscopic images was obtained intraoperatively and
submitted for post operative interpretation. This image demonstrates
surgical probe projecting posteriorly at the level of the inferior
T7 vertebral body. Redemonstrated T7 fracture status post
kyphoplasty, better characterized on prior CT. Please see the
performing provider's procedural report for further detail.
IMPRESSION: Intraoperative fluoroscopy, as detailed above.

## 2022-05-05 IMAGING — RF DG MYELOGRAM 2+ REGIONS
9 series · 9 of 9 positions shown · non-contrast
Comparison: none

CLINICAL DATA: Multiple prior back surgeries.  Weakness.
TECHNIQUE: Contiguous axial images were obtained through the Cervical,
Thoracic, and Lumbar spine after the intrathecal infusion of
infusion. Coronal and sagittal reconstructions were obtained of the
axial image sets.

[Series 1: cp_standard · 0.26mm/px · 1 of 1 slices shown (1 of 6)]
[im 1/1]
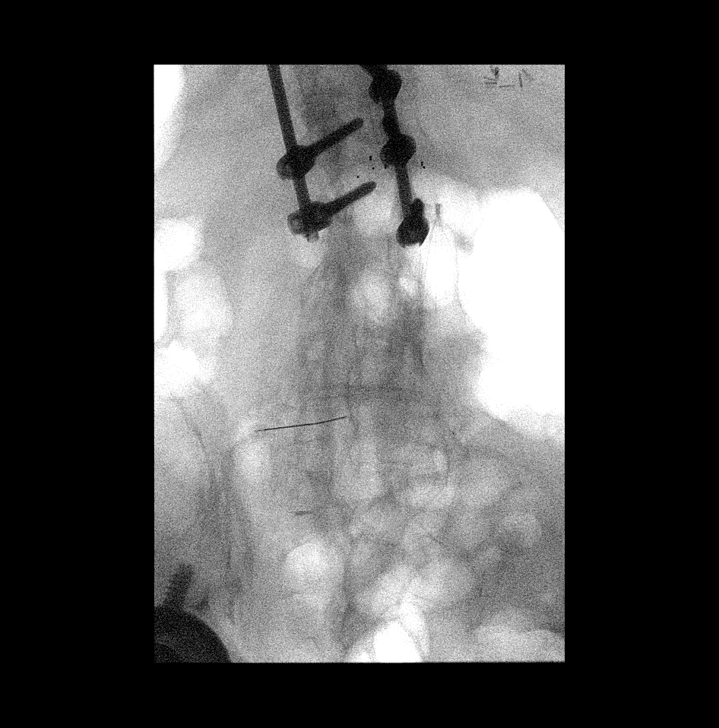

[Series 2: cp_standard · 0.26mm/px · 1 of 1 slices shown (2 of 6)]
[im 1/1]
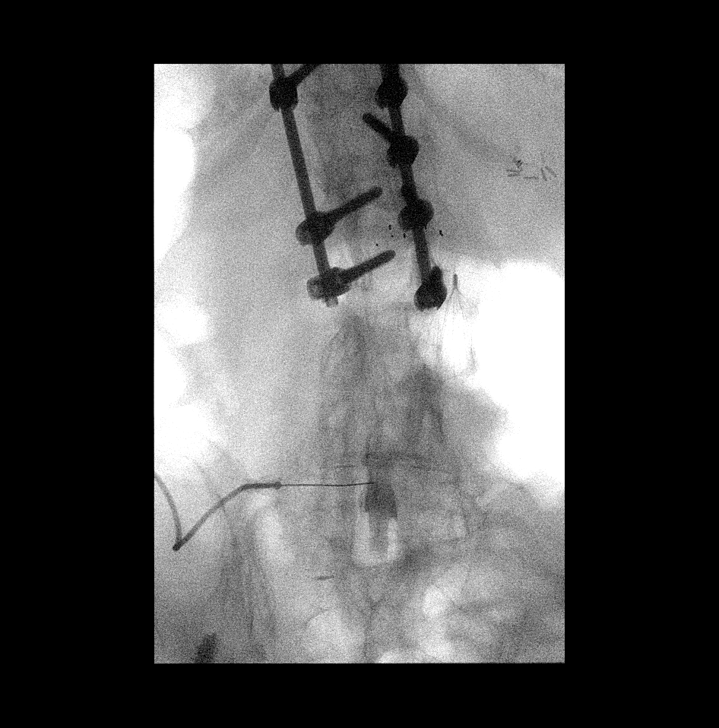

[Series 3: cp_standard · 0.25mm/px · 1 of 1 slices shown (3 of 6)]
[im 1/1]
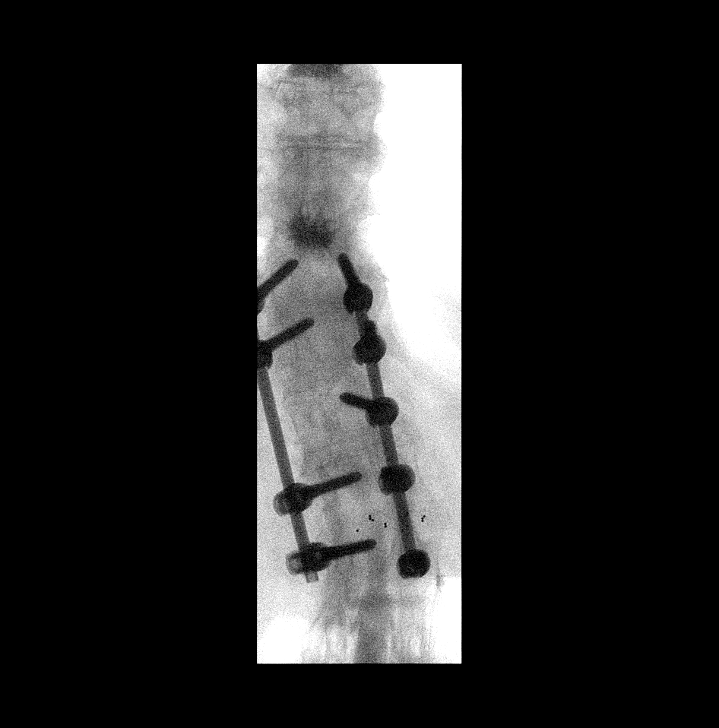

[Series 4: fluoro_myelogram_singleshot_bw · 0.17mm/px · 1 of 1 slices shown (1 of 3)]
[im 1/1]
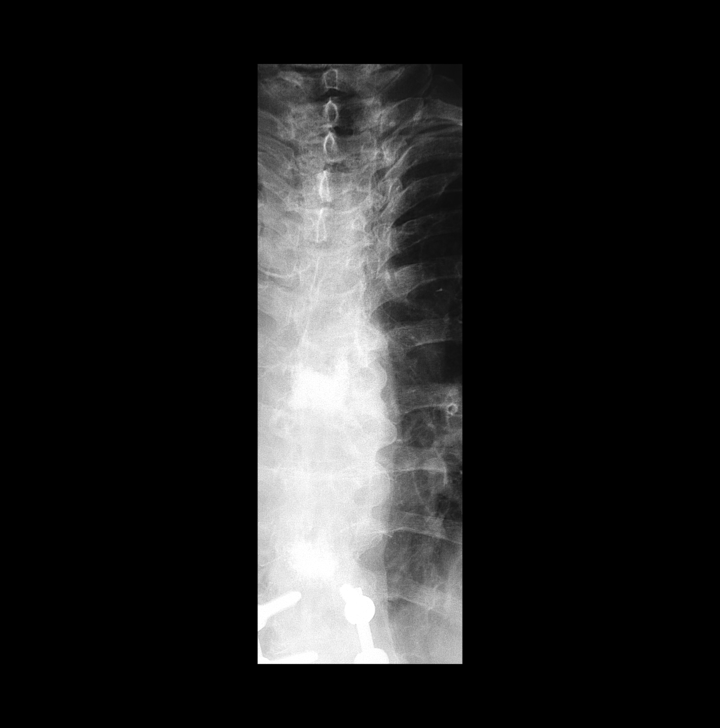

[Series 5: cp_standard · 0.25mm/px · 1 of 1 slices shown (4 of 6)]
[im 1/1]
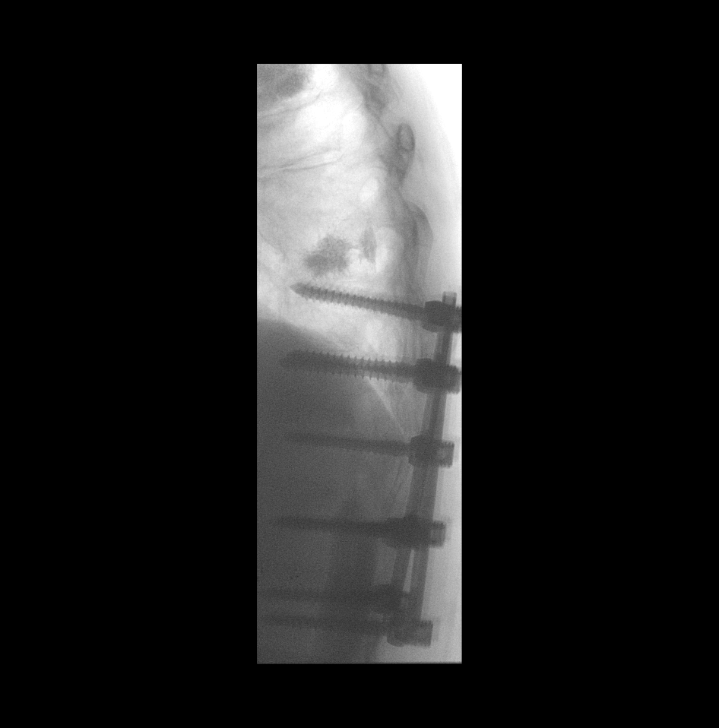

[Series 6: cp_standard · 0.25mm/px · 1 of 1 slices shown (5 of 6)]
[im 1/1]
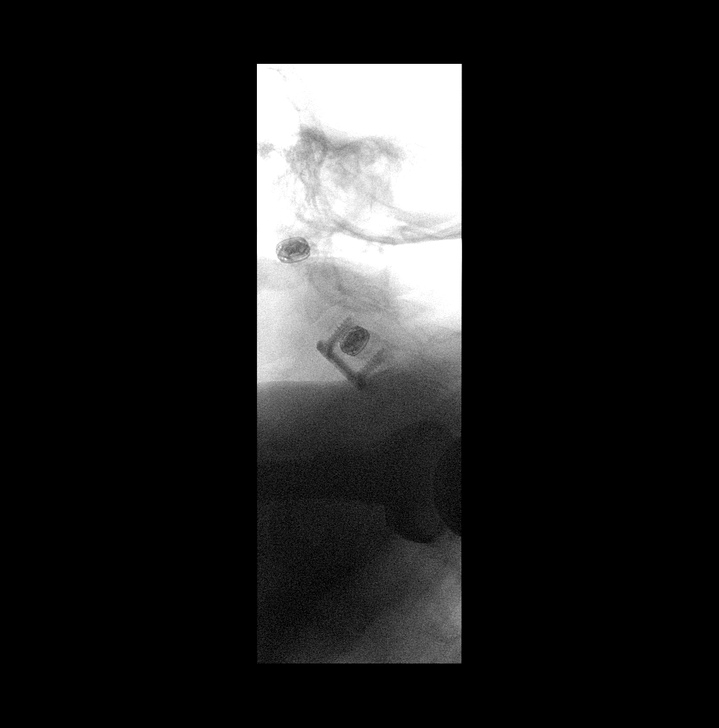

[Series 7: fluoro_myelogram_singleshot_bw · 0.17mm/px · 1 of 1 slices shown (2 of 3)]
[im 1/1]
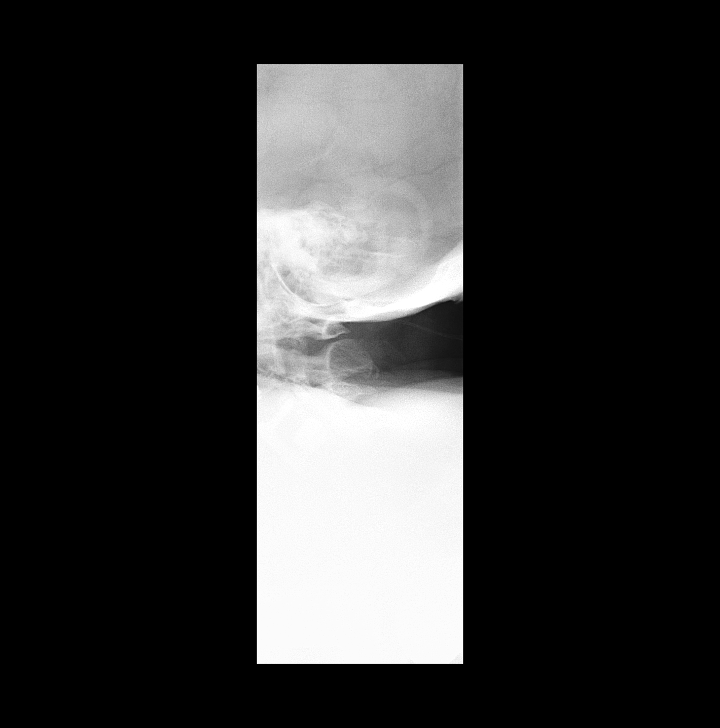

[Series 8: fluoro_myelogram_singleshot_bw · 0.17mm/px · 1 of 1 slices shown (3 of 3)]
[im 1/1]
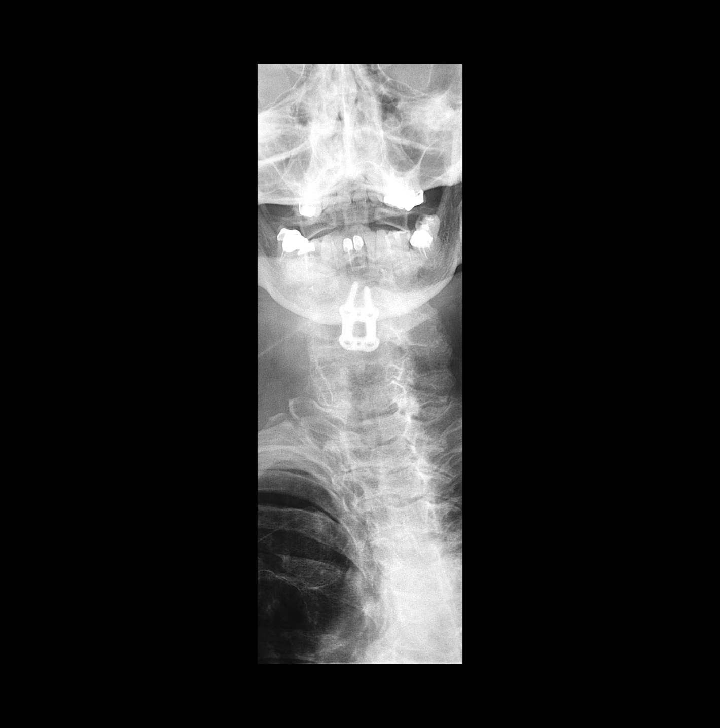

[Series 9: cp_standard · 0.25mm/px · 1 of 1 slices shown (6 of 6)]
[im 1/1]
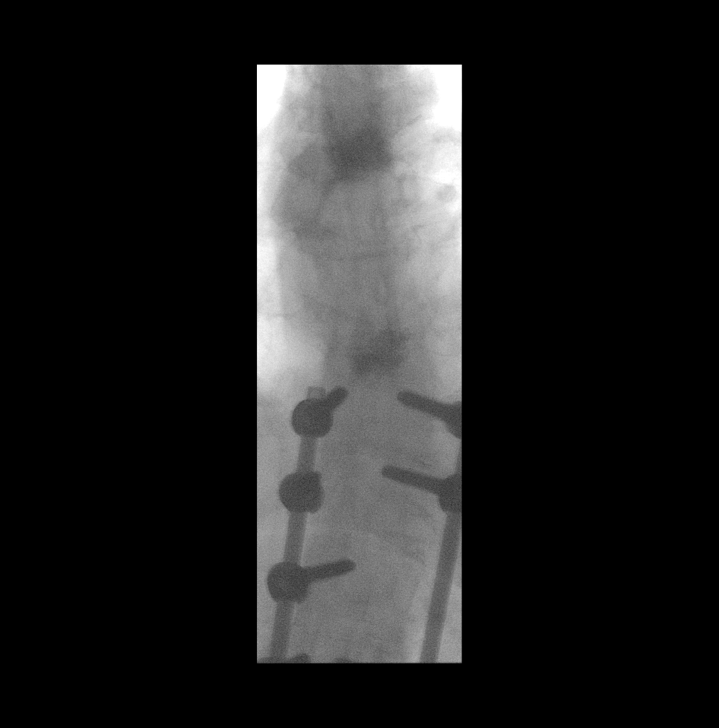

[9 of 9 positions shown; findings below may reference images not displayed]

FLUOROSCOPY TIME:  dictate in minues and seconds

PROCEDURE:
LUMBAR PUNCTURE FOR CERVICAL LUMBAR AND THORACIC MYELOGRAM

CERVICAL AND LUMBAR AND THORACIC MYELOGRAM

CT CERVICAL MYELOGRAM

CT LUMBAR MYELOGRAM

CT THORACIC MYELOGRAM

Lumbar puncture and intrathecal contrast administration were
performed by Dr Jean Francher who will separately report for the portion of
the procedure. I personally supervised acquisition of the myelogram
images.
FINDINGS: CERVICAL AND LUMBAR MYELOGRAM FINDINGS:

No diagnostic images were obtained due to body habitus and
deconditioning, fluoroscopy was used to document flow of contrast to
the craniocervical junction

CT CERVICAL MYELOGRAM FINDINGS:

Alignment: Degenerative reversal of cervical lordosis mild
retrolisthesis at C2-3 and C6-7.

Vertebrae: No evidence of fracture, discitis, or aggressive bone
lesion. Postoperative findings are described above.

Cord: Normal morphology and bulk.

Extra-spinal: No perispinal mass or inflammation noted.

Disc levels:

C2-3: Disc narrowing and bulging with central protrusion. Ligamentum
flavum thickening and facet spurring on the right more than left.
Mild right foraminal narrowing. Patent spinal canal

C3-4: ACDF with solid arthrodesis. Bridging bone causes moderate
bilateral foraminal narrowing. Patent canal.

C4-5: ACDF with solid arthrodesis and no bony impingement

C5-6: Disc collapse and endplate degeneration uncovertebral and
facet spurring worse on the right. Right more than left foraminal
impingement. Patent spinal canal

C6-7: Disc collapse and endplate degeneration. Disc bulging and
ridging with facet spurring asymmetric to the left. Left more than
right moderate foraminal impingement. Patent spinal canal

C7-T1:Unremarkable.

CT LUMBAR MYELOGRAM FINDINGS:

Segmentation: Transitional S1 vertebra when numbered from above.

Alignment: Solid fusion.

Vertebrae: Solid arthrodesis throughout the lumbar spine. No acute
fracture or bone lesion.

Extra-spinal no perispinal mass or inflammation. No acute findings
in the retroperitoneum.

Conus and cauda equina: Faint appearance of the cauda equina
distally, which could be related to image quality or generalized
nerve root thinning/atrophy. The canal is diffusely patent.

CT THORACIC MYELOGRAM FINDINGS:

Alignment: Mild kyphosis.

Vertebrae: T7 and T10 vertebroplasty. Ventral epidural cement at T10
more than T7. Prior right-sided laminotomy/foraminotomy for
decompression. Bridging osteophytes span T7 to T10.
Posterior-lateral arthrodesis hardware spans T11 to L3.

Cord: There is apparent triangular deformation of the cord at the
level of T10 with pointing ventrally at the level of the basilar
vertebral plexus and ventral epidural cement. The posterior cord is
mildly flattened at this level based on axial slices.

Extra-spinal no acute or aggressive finding.

Disc levels: At levels which are not fused there is disc collapse
and mainly ventral spurring. Mild-to-moderate facet spurring
greatest superiorly. Mild to moderate left foraminal narrowing at
T1-2. Diffusely patent spinal canal.
IMPRESSION: 1. Suspected cord deformity with ventral positioning/tethering at
the level of T10, associated with ventral epidural cement. Detail is
diminished by body habitus and cement artifact. Thoracic MRI would
likely be contributory, if possible.
2. Degenerative foraminal impingement on the right more than left at
C5-6. Moderate foraminal narrowing bilaterally at C6-7.
3. Solid arthrodesis at C3-C5.
4. Ankylosis or solid arthrodesis from T7 to the sacrum.

## 2022-07-14 DIAGNOSIS — Z6841 Body Mass Index (BMI) 40.0 and over, adult: Secondary | ICD-10-CM | POA: Diagnosis not present

## 2022-07-14 DIAGNOSIS — M546 Pain in thoracic spine: Secondary | ICD-10-CM | POA: Diagnosis not present

## 2022-07-30 ENCOUNTER — Ambulatory Visit
Admission: RE | Admit: 2022-07-30 | Discharge: 2022-07-30 | Disposition: A | Discharge status: Home / Self Care | Payer: PPO | Source: Ambulatory Visit | Attending: Obstetrics and Gynecology | Admitting: Obstetrics and Gynecology

## 2022-07-30 DIAGNOSIS — N6322 Unspecified lump in the left breast, upper inner quadrant: Secondary | ICD-10-CM | POA: Diagnosis not present

## 2022-07-30 DIAGNOSIS — N6320 Unspecified lump in the left breast, unspecified quadrant: Secondary | ICD-10-CM

## 2022-07-30 DIAGNOSIS — R928 Other abnormal and inconclusive findings on diagnostic imaging of breast: Secondary | ICD-10-CM | POA: Diagnosis not present

## 2022-07-30 DIAGNOSIS — N6321 Unspecified lump in the left breast, upper outer quadrant: Secondary | ICD-10-CM | POA: Diagnosis not present

## 2022-08-02 DIAGNOSIS — H524 Presbyopia: Secondary | ICD-10-CM | POA: Diagnosis not present

## 2022-08-02 DIAGNOSIS — H5203 Hypermetropia, bilateral: Secondary | ICD-10-CM | POA: Diagnosis not present

## 2022-08-02 DIAGNOSIS — H25011 Cortical age-related cataract, right eye: Secondary | ICD-10-CM | POA: Diagnosis not present

## 2022-08-02 DIAGNOSIS — H31012 Macula scars of posterior pole (postinflammatory) (post-traumatic), left eye: Secondary | ICD-10-CM | POA: Diagnosis not present

## 2022-08-02 DIAGNOSIS — Z961 Presence of intraocular lens: Secondary | ICD-10-CM | POA: Diagnosis not present

## 2022-08-02 DIAGNOSIS — H2511 Age-related nuclear cataract, right eye: Secondary | ICD-10-CM | POA: Diagnosis not present

## 2022-08-17 DIAGNOSIS — I10 Essential (primary) hypertension: Secondary | ICD-10-CM | POA: Diagnosis not present

## 2022-08-17 DIAGNOSIS — D649 Anemia, unspecified: Secondary | ICD-10-CM | POA: Diagnosis not present

## 2022-08-17 DIAGNOSIS — R7989 Other specified abnormal findings of blood chemistry: Secondary | ICD-10-CM | POA: Diagnosis not present

## 2022-08-17 DIAGNOSIS — E781 Pure hyperglyceridemia: Secondary | ICD-10-CM | POA: Diagnosis not present

## 2022-08-17 DIAGNOSIS — E039 Hypothyroidism, unspecified: Secondary | ICD-10-CM | POA: Diagnosis not present

## 2022-08-17 DIAGNOSIS — N1832 Chronic kidney disease, stage 3b: Secondary | ICD-10-CM | POA: Diagnosis not present

## 2022-08-17 DIAGNOSIS — Z1212 Encounter for screening for malignant neoplasm of rectum: Secondary | ICD-10-CM | POA: Diagnosis not present

## 2022-08-24 DIAGNOSIS — I700 Atherosclerosis of aorta: Secondary | ICD-10-CM | POA: Diagnosis not present

## 2022-08-24 DIAGNOSIS — Z23 Encounter for immunization: Secondary | ICD-10-CM | POA: Diagnosis not present

## 2022-08-24 DIAGNOSIS — G609 Hereditary and idiopathic neuropathy, unspecified: Secondary | ICD-10-CM | POA: Diagnosis not present

## 2022-08-24 DIAGNOSIS — Z1212 Encounter for screening for malignant neoplasm of rectum: Secondary | ICD-10-CM | POA: Diagnosis not present

## 2022-08-24 DIAGNOSIS — R2689 Other abnormalities of gait and mobility: Secondary | ICD-10-CM | POA: Diagnosis not present

## 2022-08-24 DIAGNOSIS — I350 Nonrheumatic aortic (valve) stenosis: Secondary | ICD-10-CM | POA: Diagnosis not present

## 2022-08-24 DIAGNOSIS — Z1331 Encounter for screening for depression: Secondary | ICD-10-CM | POA: Diagnosis not present

## 2022-08-24 DIAGNOSIS — F1120 Opioid dependence, uncomplicated: Secondary | ICD-10-CM | POA: Diagnosis not present

## 2022-08-24 DIAGNOSIS — Z0000 Encounter for general adult medical examination without abnormal findings: Secondary | ICD-10-CM | POA: Diagnosis not present

## 2022-08-24 DIAGNOSIS — G894 Chronic pain syndrome: Secondary | ICD-10-CM | POA: Diagnosis not present

## 2022-08-24 DIAGNOSIS — N1832 Chronic kidney disease, stage 3b: Secondary | ICD-10-CM | POA: Diagnosis not present

## 2022-08-24 DIAGNOSIS — Z7901 Long term (current) use of anticoagulants: Secondary | ICD-10-CM | POA: Diagnosis not present

## 2022-08-24 DIAGNOSIS — D649 Anemia, unspecified: Secondary | ICD-10-CM | POA: Diagnosis not present

## 2022-08-24 DIAGNOSIS — I129 Hypertensive chronic kidney disease with stage 1 through stage 4 chronic kidney disease, or unspecified chronic kidney disease: Secondary | ICD-10-CM | POA: Diagnosis not present

## 2022-08-24 DIAGNOSIS — I10 Essential (primary) hypertension: Secondary | ICD-10-CM | POA: Diagnosis not present

## 2022-08-24 DIAGNOSIS — M1990 Unspecified osteoarthritis, unspecified site: Secondary | ICD-10-CM | POA: Diagnosis not present

## 2022-08-24 DIAGNOSIS — Z1389 Encounter for screening for other disorder: Secondary | ICD-10-CM | POA: Diagnosis not present

## 2023-01-05 DIAGNOSIS — M546 Pain in thoracic spine: Secondary | ICD-10-CM | POA: Diagnosis not present

## 2023-01-05 DIAGNOSIS — M5412 Radiculopathy, cervical region: Secondary | ICD-10-CM | POA: Diagnosis not present

## 2023-01-07 ENCOUNTER — Other Ambulatory Visit: Payer: Self-pay | Admitting: Neurological Surgery

## 2023-01-07 DIAGNOSIS — M5412 Radiculopathy, cervical region: Secondary | ICD-10-CM

## 2023-01-14 ENCOUNTER — Ambulatory Visit
Admission: RE | Admit: 2023-01-14 | Discharge: 2023-01-14 | Disposition: A | Discharge status: Home / Self Care | Payer: PPO | Source: Ambulatory Visit | Attending: Neurological Surgery | Admitting: Neurological Surgery

## 2023-01-14 DIAGNOSIS — R200 Anesthesia of skin: Secondary | ICD-10-CM | POA: Diagnosis not present

## 2023-01-14 DIAGNOSIS — M5412 Radiculopathy, cervical region: Secondary | ICD-10-CM

## 2023-01-14 DIAGNOSIS — M542 Cervicalgia: Secondary | ICD-10-CM | POA: Diagnosis not present

## 2023-02-03 DIAGNOSIS — M5412 Radiculopathy, cervical region: Secondary | ICD-10-CM | POA: Diagnosis not present

## 2023-02-11 ENCOUNTER — Ambulatory Visit (HOSPITAL_COMMUNITY)
Admission: RE | Admit: 2023-02-11 | Discharge: 2023-02-11 | Disposition: A | Discharge status: Home / Self Care | Payer: PPO | Source: Ambulatory Visit | Attending: Family Medicine | Admitting: Family Medicine

## 2023-02-11 ENCOUNTER — Other Ambulatory Visit (HOSPITAL_COMMUNITY): Payer: Self-pay | Admitting: Family Medicine

## 2023-02-11 DIAGNOSIS — R109 Unspecified abdominal pain: Secondary | ICD-10-CM

## 2023-02-11 DIAGNOSIS — W19XXXA Unspecified fall, initial encounter: Secondary | ICD-10-CM | POA: Diagnosis not present

## 2023-02-11 DIAGNOSIS — I129 Hypertensive chronic kidney disease with stage 1 through stage 4 chronic kidney disease, or unspecified chronic kidney disease: Secondary | ICD-10-CM | POA: Diagnosis not present

## 2023-02-11 DIAGNOSIS — M1990 Unspecified osteoarthritis, unspecified site: Secondary | ICD-10-CM | POA: Diagnosis not present

## 2023-02-11 DIAGNOSIS — N1832 Chronic kidney disease, stage 3b: Secondary | ICD-10-CM | POA: Diagnosis not present

## 2023-02-11 DIAGNOSIS — N390 Urinary tract infection, site not specified: Secondary | ICD-10-CM | POA: Diagnosis not present

## 2023-02-11 DIAGNOSIS — Z86711 Personal history of pulmonary embolism: Secondary | ICD-10-CM | POA: Diagnosis not present

## 2023-02-11 DIAGNOSIS — F1120 Opioid dependence, uncomplicated: Secondary | ICD-10-CM | POA: Diagnosis not present

## 2023-02-11 DIAGNOSIS — G894 Chronic pain syndrome: Secondary | ICD-10-CM | POA: Diagnosis not present

## 2023-03-04 DIAGNOSIS — R600 Localized edema: Secondary | ICD-10-CM | POA: Diagnosis not present

## 2023-03-04 DIAGNOSIS — N1832 Chronic kidney disease, stage 3b: Secondary | ICD-10-CM | POA: Diagnosis not present

## 2023-03-04 DIAGNOSIS — E039 Hypothyroidism, unspecified: Secondary | ICD-10-CM | POA: Diagnosis not present

## 2023-03-04 DIAGNOSIS — D649 Anemia, unspecified: Secondary | ICD-10-CM | POA: Diagnosis not present

## 2023-03-04 DIAGNOSIS — Z7901 Long term (current) use of anticoagulants: Secondary | ICD-10-CM | POA: Diagnosis not present

## 2023-03-04 DIAGNOSIS — I350 Nonrheumatic aortic (valve) stenosis: Secondary | ICD-10-CM | POA: Diagnosis not present

## 2023-03-04 DIAGNOSIS — I2510 Atherosclerotic heart disease of native coronary artery without angina pectoris: Secondary | ICD-10-CM | POA: Diagnosis not present

## 2023-03-04 DIAGNOSIS — G609 Hereditary and idiopathic neuropathy, unspecified: Secondary | ICD-10-CM | POA: Diagnosis not present

## 2023-03-04 DIAGNOSIS — R0602 Shortness of breath: Secondary | ICD-10-CM | POA: Diagnosis not present

## 2023-03-04 DIAGNOSIS — I129 Hypertensive chronic kidney disease with stage 1 through stage 4 chronic kidney disease, or unspecified chronic kidney disease: Secondary | ICD-10-CM | POA: Diagnosis not present

## 2023-03-04 DIAGNOSIS — I700 Atherosclerosis of aorta: Secondary | ICD-10-CM | POA: Diagnosis not present

## 2023-03-04 DIAGNOSIS — Z86711 Personal history of pulmonary embolism: Secondary | ICD-10-CM | POA: Diagnosis not present

## 2023-03-07 DIAGNOSIS — D649 Anemia, unspecified: Secondary | ICD-10-CM | POA: Diagnosis not present

## 2023-03-07 DIAGNOSIS — I10 Essential (primary) hypertension: Secondary | ICD-10-CM | POA: Diagnosis not present

## 2023-03-18 DIAGNOSIS — I129 Hypertensive chronic kidney disease with stage 1 through stage 4 chronic kidney disease, or unspecified chronic kidney disease: Secondary | ICD-10-CM | POA: Diagnosis not present

## 2023-03-18 DIAGNOSIS — Z7901 Long term (current) use of anticoagulants: Secondary | ICD-10-CM | POA: Diagnosis not present

## 2023-03-18 DIAGNOSIS — Z7689 Persons encountering health services in other specified circumstances: Secondary | ICD-10-CM | POA: Diagnosis not present

## 2023-03-18 DIAGNOSIS — I517 Cardiomegaly: Secondary | ICD-10-CM | POA: Diagnosis not present

## 2023-03-18 DIAGNOSIS — Z86711 Personal history of pulmonary embolism: Secondary | ICD-10-CM | POA: Diagnosis not present

## 2023-03-18 DIAGNOSIS — N1832 Chronic kidney disease, stage 3b: Secondary | ICD-10-CM | POA: Diagnosis not present

## 2023-03-18 DIAGNOSIS — I350 Nonrheumatic aortic (valve) stenosis: Secondary | ICD-10-CM | POA: Diagnosis not present

## 2023-03-18 DIAGNOSIS — D649 Anemia, unspecified: Secondary | ICD-10-CM | POA: Diagnosis not present

## 2023-03-18 DIAGNOSIS — R0602 Shortness of breath: Secondary | ICD-10-CM | POA: Diagnosis not present

## 2023-03-18 DIAGNOSIS — R600 Localized edema: Secondary | ICD-10-CM | POA: Diagnosis not present

## 2023-03-25 DIAGNOSIS — K219 Gastro-esophageal reflux disease without esophagitis: Secondary | ICD-10-CM | POA: Diagnosis not present

## 2023-03-25 DIAGNOSIS — D509 Iron deficiency anemia, unspecified: Secondary | ICD-10-CM | POA: Diagnosis not present

## 2023-04-01 DIAGNOSIS — Z7901 Long term (current) use of anticoagulants: Secondary | ICD-10-CM | POA: Diagnosis not present

## 2023-04-01 DIAGNOSIS — I129 Hypertensive chronic kidney disease with stage 1 through stage 4 chronic kidney disease, or unspecified chronic kidney disease: Secondary | ICD-10-CM | POA: Diagnosis not present

## 2023-04-01 DIAGNOSIS — E871 Hypo-osmolality and hyponatremia: Secondary | ICD-10-CM | POA: Diagnosis not present

## 2023-04-01 DIAGNOSIS — Z86711 Personal history of pulmonary embolism: Secondary | ICD-10-CM | POA: Diagnosis not present

## 2023-04-01 DIAGNOSIS — D649 Anemia, unspecified: Secondary | ICD-10-CM | POA: Diagnosis not present

## 2023-04-01 DIAGNOSIS — I350 Nonrheumatic aortic (valve) stenosis: Secondary | ICD-10-CM | POA: Diagnosis not present

## 2023-04-01 DIAGNOSIS — I517 Cardiomegaly: Secondary | ICD-10-CM | POA: Diagnosis not present

## 2023-04-01 DIAGNOSIS — N1832 Chronic kidney disease, stage 3b: Secondary | ICD-10-CM | POA: Diagnosis not present

## 2023-04-01 DIAGNOSIS — R0602 Shortness of breath: Secondary | ICD-10-CM | POA: Diagnosis not present

## 2023-04-01 DIAGNOSIS — R600 Localized edema: Secondary | ICD-10-CM | POA: Diagnosis not present

## 2023-04-07 DIAGNOSIS — D649 Anemia, unspecified: Secondary | ICD-10-CM | POA: Diagnosis not present

## 2023-04-07 DIAGNOSIS — N1832 Chronic kidney disease, stage 3b: Secondary | ICD-10-CM | POA: Diagnosis not present

## 2023-04-07 DIAGNOSIS — Z86711 Personal history of pulmonary embolism: Secondary | ICD-10-CM | POA: Diagnosis not present

## 2023-04-07 DIAGNOSIS — I129 Hypertensive chronic kidney disease with stage 1 through stage 4 chronic kidney disease, or unspecified chronic kidney disease: Secondary | ICD-10-CM | POA: Diagnosis not present

## 2023-04-07 DIAGNOSIS — E871 Hypo-osmolality and hyponatremia: Secondary | ICD-10-CM | POA: Diagnosis not present

## 2023-04-07 DIAGNOSIS — I350 Nonrheumatic aortic (valve) stenosis: Secondary | ICD-10-CM | POA: Diagnosis not present

## 2023-04-07 DIAGNOSIS — I517 Cardiomegaly: Secondary | ICD-10-CM | POA: Diagnosis not present

## 2023-04-07 DIAGNOSIS — R0602 Shortness of breath: Secondary | ICD-10-CM | POA: Diagnosis not present

## 2023-04-07 DIAGNOSIS — Z7901 Long term (current) use of anticoagulants: Secondary | ICD-10-CM | POA: Diagnosis not present

## 2023-04-07 DIAGNOSIS — R600 Localized edema: Secondary | ICD-10-CM | POA: Diagnosis not present

## 2023-04-11 ENCOUNTER — Inpatient Hospital Stay: Payer: PPO

## 2023-04-11 ENCOUNTER — Encounter: Payer: Self-pay | Admitting: Hematology and Oncology

## 2023-04-11 ENCOUNTER — Inpatient Hospital Stay: Payer: PPO | Attending: Hematology and Oncology | Admitting: Hematology and Oncology

## 2023-04-11 ENCOUNTER — Other Ambulatory Visit: Payer: Self-pay

## 2023-04-11 VITALS — BP 147/56 | HR 68 | Temp 98.0°F | Resp 18 | Ht 63.0 in | Wt 279.0 lb

## 2023-04-11 DIAGNOSIS — Z79899 Other long term (current) drug therapy: Secondary | ICD-10-CM | POA: Diagnosis not present

## 2023-04-11 DIAGNOSIS — D500 Iron deficiency anemia secondary to blood loss (chronic): Secondary | ICD-10-CM | POA: Diagnosis present

## 2023-04-11 DIAGNOSIS — N1831 Chronic kidney disease, stage 3a: Secondary | ICD-10-CM

## 2023-04-11 DIAGNOSIS — N183 Chronic kidney disease, stage 3 unspecified: Secondary | ICD-10-CM | POA: Diagnosis not present

## 2023-04-11 DIAGNOSIS — D539 Nutritional anemia, unspecified: Secondary | ICD-10-CM

## 2023-04-11 DIAGNOSIS — Z86718 Personal history of other venous thrombosis and embolism: Secondary | ICD-10-CM | POA: Diagnosis not present

## 2023-04-11 DIAGNOSIS — Z7901 Long term (current) use of anticoagulants: Secondary | ICD-10-CM | POA: Diagnosis not present

## 2023-04-11 DIAGNOSIS — N182 Chronic kidney disease, stage 2 (mild): Secondary | ICD-10-CM

## 2023-04-11 MED ORDER — FUROSEMIDE 40 MG PO TABS: 40.0000 mg | ORAL_TABLET | Freq: Two times a day (BID) | ORAL | Status: DC

## 2023-04-11 NOTE — Assessment & Plan Note (Signed)
She has been having intermittent GERD like symptoms Prior EGD evaluation in 2019 did not reveal signs of ulceration or significant gastritis She is taking antiacid medicine that could potentially reduced/inhibit absorption of iron She does not eat much iron rich food She has taken oral iron supplement for almost a month and did not feel much better  We discussed risk and benefits of checking her blood today and since her blood work was only done 10 days ago, I am not in favor of repeating labs today The most likely cause of her anemia is due to chronic blood loss/malabsorption syndrome. We discussed some of the risks, benefits, and alternatives of intravenous iron infusions. The patient is symptomatic from anemia and the iron level is critically low. She tolerated oral iron supplement poorly and desires to achieved higher levels of iron faster for adequate hematopoesis. Some of the side-effects to be expected including risks of infusion reactions, phlebitis, headaches, nausea and fatigue.  The patient is willing to proceed. Patient education material was dispensed.  Goal is to keep ferritin level greater than 50 and resolution of anemia She is in agreement to proceed with intravenous iron and I will prescribe 2 doses of IV iron Feraheme I plan to repeat blood work again end of August She is scheduled for repeat endoscopy evaluation with gastroenterologist next month

## 2023-04-11 NOTE — Assessment & Plan Note (Signed)
She had history of provoked DVT but due to her reduced mobility, I am in agreement with her primary care doctor for her to remain on Xarelto for secondary prevention

## 2023-04-11 NOTE — Progress Notes (Signed)
[facility] CONSULT NOTE  Patient Care Team: Creola Corn, MD as PCP - General (Internal Medicine) Lyn Records, MD (Inactive) as PCP - Cardiology (Cardiology) Barnett Abu, MD as Consulting Physician (Neurosurgery)  ASSESSMENT & PLAN:  Iron deficiency anemia due to chronic blood loss She has been having intermittent GERD like symptoms Prior EGD evaluation in 2019 did not reveal signs of ulceration or significant gastritis She is taking antiacid medicine that could potentially reduced/inhibit absorption of iron She does not eat much iron rich food She has taken oral iron supplement for almost a month and did not feel much better  We discussed risk and benefits of checking her blood today and since her blood work was only done 10 days ago, I am not in favor of repeating labs today The most likely cause of her anemia is due to chronic blood loss/malabsorption syndrome. We discussed some of the risks, benefits, and alternatives of intravenous iron infusions. The patient is symptomatic from anemia and the iron level is critically low. She tolerated oral iron supplement poorly and desires to achieved higher levels of iron faster for adequate hematopoesis. Some of the side-effects to be expected including risks of infusion reactions, phlebitis, headaches, nausea and fatigue.  The patient is willing to proceed. Patient education material was dispensed.  Goal is to keep ferritin level greater than 50 and resolution of anemia She is in agreement to proceed with intravenous iron and I will prescribe 2 doses of IV iron Feraheme I plan to repeat blood work again end of August She is scheduled for repeat endoscopy evaluation with gastroenterologist next month   Chronic kidney disease, stage III (moderate) (HCC) She is noted to have borderline reduced kidney function, chronic kidney disease stage III likely due to her age There is possibility she can have anemia of chronic disease I  will recheck iron studies and EPO level in her next visit  History of DVT (deep vein thrombosis) She had history of provoked DVT but due to her reduced mobility, I am in agreement with her primary care doctor for her to remain on Xarelto for secondary prevention Orders Placed This Encounter  Procedures   Iron and Iron Binding Capacity ([facility] only)    Standing Status:   Future    Standing Expiration Date:   04/10/2024   Ferritin    Standing Status:   Future    Standing Expiration Date:   04/10/2024   Erythropoietin    Standing Status:   Future    Standing Expiration Date:   04/10/2024   CBC with Differential (Cancer Center Only)    Standing Status:   Future    Standing Expiration Date:   04/10/2024   Reticulocytes    Standing Status:   Future    Standing Expiration Date:   04/10/2024   Sedimentation rate    Standing Status:   Future    Standing Expiration Date:   04/10/2024   Vitamin B12    Standing Status:   Future    Standing Expiration Date:   04/10/2024   Basic Metabolic Panel - Cancer Center Only    Standing Status:   Future    Standing Expiration Date:   04/10/2024   ABO/Rh    Standing Status:   Future    Standing Expiration Date:   04/10/2024    All questions were answered. The patient knows to call the clinic with any problems, questions or concerns.  The total time spent in the appointment  was 60 minutes encounter with patients including review of chart and various tests results, discussions about plan of care and coordination of care plan  Artis Delay, MD 6/24/202412:56 PM   CHIEF COMPLAINTS/PURPOSE OF CONSULTATION:  Anemia  HISTORY OF PRESENTING ILLNESS:  [name] 77 y.o. female is here because of anemia  She was found to have abnormal CBC from recent blood work I have the opportunity to review her electronic records dated back to 2008 Her hemoglobin was as low as 8.6 in April 2009 Her last normal blood count was on August 18, 2018 with a hemoglobin of  12.8 Most recently, on April 08, 2023, she have hemoglobin of 10.1, MCV of 88, platelet count 311 and her white count was 6.6 She is noted to have borderline renal function with a creatinine of 1.3 Iron studies came back abnormal with ferritin of 13  She denies recent chest pain on exertion, but does complain of shortness of breath on minimal exertion.  She complained of excessive fatigue but denies pre-syncopal episodes, or palpitations. She had not noticed any recent bleeding such as epistaxis, hematuria or hematochezia The patient denies over the counter NSAID ingestion. She is on chronic anticoagulation therapy for many years due to history of provoked DVT and PE She remained on anticoagulation therapy for secondary prevention due to poor mobility Her last colonoscopy was in 2019 She had both EGD and colonoscopy performed, results were within normal range She had no prior history or diagnosis of cancer. Her age appropriate screening programs are up-to-date. She has pica with ice craving.  She eats a variety of diet but in general does not eat a lot of meat. She never donated blood but had blood transfusion in the past The patient was prescribed oral iron supplements and she takes daily.  She has occasional constipation  MEDICAL HISTORY:  Past Medical History:  Diagnosis Date   Anemia    Arthritis    osteoarthritis   Claustrophobia    Complication of anesthesia    hard time waking up after her gallbladder surgery 28 years ago   DVT (deep venous thrombosis) (HCC)    left leg   GERD (gastroesophageal reflux disease)    hx of   Heart murmur    Dr. Katrinka Blazing follows, 'nothing to worry about.'   History of blood transfusion    History of hiatal hernia    small   History of kidney stones    Hypercholesteremia    under control   Hypertension    Hypothyroidism    Mild aortic stenosis    a. by echo 12/2017.   Panic attack    PONV (postoperative nausea and vomiting)    Pulmonary  embolism (HCC)    bilateral lungs - 2008 after disc surgery    SURGICAL HISTORY: Past Surgical History:  Procedure Laterality Date   ABDOMINAL HYSTERECTOMY  10/18/1973   partial   APPENDECTOMY  10/19/1971   APPLICATION OF ROBOTIC ASSISTANCE FOR SPINAL PROCEDURE N/A 10/05/2021   Procedure: APPLICATION OF ROBOTIC ASSISTANCE FOR SPINAL PROCEDURE;  Surgeon: Barnett Abu, MD;  Location: [facility] OR;  Service: Neurosurgery;  Laterality: N/A;   BACK SURGERY     [phone]   CARDIAC CATHETERIZATION  ~2010   CARPAL TUNNEL RELEASE Bilateral    CHOLECYSTECTOMY  10/19/1995   COLONOSCOPY     COLONOSCOPY WITH PROPOFOL N/A 12/05/2017   Procedure: COLONOSCOPY WITH PROPOFOL;  Surgeon: Carman Ching, MD;  Location: [facility];  Service: Endoscopy;  Laterality: N/A;  DILATION AND CURETTAGE OF UTERUS     ENDARTERECTOMY Right 08/10/2013   Procedure: Excision of Venous Aneurysm Right Neck;  Surgeon: Larina Earthly, MD;  Location: [facility] OR;  Service: Vascular;  Laterality: Right;   ESOPHAGOGASTRODUODENOSCOPY (EGD) WITH PROPOFOL N/A 12/05/2017   Procedure: ESOPHAGOGASTRODUODENOSCOPY (EGD) WITH PROPOFOL;  Surgeon: Carman Ching, MD;  Location: [facility] END[facility];  Service: Endoscopy;  Laterality: N/A;   filter Left 10/19/2007   IVC   FRACTURE SURGERY Left    left wrist   HIP SURGERY Left 10/18/2006   JOINT REPLACEMENT Right 11/19/2003   (knee)multiple surgeries   JOINT REPLACEMENT Left 12/16/2009   (knee)   JOINT REPLACEMENT Left 2011, 2012   Hip   KYPHOPLASTY N/A 03/03/2021   Procedure: Thoracic seven Kyphoplasty;  Surgeon: Barnett Abu, MD;  Location: [facility] OR;  Service: Neurosurgery;  Laterality: N/A;   KYPHOPLASTY N/A 03/31/2021   Procedure: Thoracic seven Kyphoplasty;  Surgeon: Barnett Abu, MD;  Location: [facility] OR;  Service: Neurosurgery;  Laterality: N/A;   LUMBAR LAMINECTOMY/ DECOMPRESSION WITH MET-RX Right 04/17/2021   Procedure: Right Thoracic Seven-Eight  Extraforaminal decompression/foraminotomy;   Surgeon: Barnett Abu, MD;  Location: [facility] OR;  Service: Neurosurgery;  Laterality: Right;   MASS EXCISION  10/18/1972   uterus   QUADRICEPS TENDON REPAIR Right 12/06/2016   Procedure: QUAD TENDON RECONSTRUCTION;  Surgeon: Durene Romans, MD;  Location: [facility] ORS;  Service: Orthopedics;  Laterality: Right;  Requests for 90 mins   RADIOLOGY WITH ANESTHESIA N/A 01/20/2021   Procedure: MRI WITH [facility] PAIN WITHOUT CONTRAST;  Surgeon: Radiologist, Medication, MD;  Location: [facility] OR;  Service: Radiology;  Laterality: N/A;   RADIOLOGY WITH ANESTHESIA N/A 10/02/2021   Procedure: MRI WITH ANESTHESIA;  Surgeon: Radiologist, Medication, MD;  Location: [facility] OR;  Service: Radiology;  Laterality: N/A;   TONSILLECTOMY  10/18/1984   TOTAL HIP REVISION Left 12/07/2013   Procedure:  REVISION  CONSTRAINED LINER LEFT TOTAL HIP ;  Surgeon: Shelda Pal, MD;  Location: [facility];  Service: Orthopedics;  Laterality: Left;   TOTAL SHOULDER REPLACEMENT Right 11/30/2010   TUBAL LIGATION  10/19/1971    SOCIAL HISTORY: Social History   Socioeconomic History   Marital status: Married    Spouse name: Not on file   Number of children: Not on file   Years of education: Not on file   Highest education level: Not on file  Occupational History   Not on file  Tobacco Use   Smoking status: Never   Smokeless tobacco: Never  Vaping Use   Vaping Use: Never used  Substance and Sexual Activity   Alcohol use: No   Drug use: No   Sexual activity: Not Currently    Birth control/protection: Surgical    Comment: Hysterectomy  Other Topics Concern   Not on file  Social History Narrative   Not on file   Social Determinants of Health   Financial Resource Strain: Not on file  Food Insecurity: Not on file  Transportation Needs: Not on file  Physical Activity: Not on file  Stress: Not on file  Social Connections: Not on file  Intimate Partner Violence: Not on file    FAMILY HISTORY: Family History  Problem Relation Age of  Onset   Diabetes Mother    Heart disease Mother        before age 71 - ?thick blood - pt does not know if patient had formal heart issue but heart stopped   Hyperlipidemia Mother    Hypertension Mother  Diabetes Father    Hyperlipidemia Father    Hypertension Father    Heart attack Father        in his 25s during adm for flu   Peripheral vascular disease Father    Other Father        amputation   Diabetes Sister    Hyperlipidemia Sister    Hypertension Sister    Deep vein thrombosis Brother    Hyperlipidemia Brother    Hypertension Brother     ALLERGIES:  is allergic to celecoxib.  MEDICATIONS:  Current Outpatient Medications  Medication Sig Dispense Refill   ferrous sulfate 325 (65 FE) MG EC tablet Take 325 mg by mouth 2 (two) times daily.     promethazine (PHENERGAN) 25 MG tablet Take 25 mg by mouth every 8 (eight) hours as needed for nausea.     ALPRAZolam (XANAX) 0.5 MG tablet Take 0.5 mg by mouth 2 (two) times daily.     Ascorbic Acid (VITAMIN C) 250 MG CHEW Chew 500 mg by mouth daily.     atenolol (TENORMIN) 25 MG tablet Take 25 mg by mouth See admin instructions. Take one tablet (25 mg) by mouth every morning, may also take a 2nd tablet (25 mg) if SBP >150     Bacillus Coagulans-Inulin (PROBIOTIC-PREBIOTIC PO) Take 1 tablet by mouth in the morning.     baclofen (LIORESAL) 10 MG tablet Take 1 tablet (10 mg total) by mouth every 8 (eight) hours. 30 each 0   escitalopram (LEXAPRO) 5 MG tablet Take 2.5 mg by mouth at bedtime.     famotidine (PEPCID) 40 MG tablet Take 40 mg by mouth at bedtime.  3   fluticasone (FLONASE) 50 MCG/ACT nasal spray Place 1 spray into both nostrils daily as needed for allergies or rhinitis.     furosemide (LASIX) 40 MG tablet Take 1 tablet (40 mg total) by mouth 2 (two) times daily.     gabapentin (NEURONTIN) 600 MG tablet Take 600 mg by mouth 3 (three) times daily.     gabapentin (NEURONTIN) 600 MG tablet Take 1 tablet by mouth 3 (three) times  daily.     Histamine Dihydrochloride (AUSTRALIAN DREAM ARTHRITIS) 0.025 % CREA Apply 1 application topically 4 (four) times daily as needed (spasms/joint pain.).     HYDROcodone-acetaminophen (NORCO) 10-325 MG tablet Take 1 tablet by mouth every 3 (three) hours as needed for moderate pain or severe pain. 50 tablet 0   levothyroxine (SYNTHROID, LEVOTHROID) 50 MCG tablet Take 25 mcg by mouth every Monday, Wednesday, and Friday. Skip Sunday, Tuesday, Thursday, Saturday  1   losartan (COZAAR) 50 MG tablet Take 50 mg by mouth 2 (two) times daily.     montelukast (SINGULAIR) 10 MG tablet Take 10 mg by mouth daily as needed.     Multiple Vitamins-Minerals (MULTI + OMEGA-3 ADULT GUMMIES PO) Take 2 tablets by mouth in the morning.     omeprazole (PRILOSEC) 40 MG capsule Take 40 mg by mouth daily.     Rivaroxaban (XARELTO) 20 MG TABS tablet Take 20 mg by mouth daily at 4 PM.     simvastatin (ZOCOR) 20 MG tablet Take 20 mg by mouth at bedtime.     No current facility-administered medications for this visit.    REVIEW OF SYSTEMS: She had accidental fall last Friday but did not injure herself. Constitutional: Denies fevers, chills or abnormal night sweats Eyes: Denies blurriness of vision, double vision or watery eyes Ears, nose, mouth, throat, and face:  Denies mucositis or sore throat Respiratory: Denies cough Cardiovascular: Denies palpitation, chest discomfort but she has chronic moderate bilateral lower extremity swelling Gastrointestinal:  Denies nausea, heartburn or change in bowel habits Skin: Denies abnormal skin rashes Lymphatics: Denies new lymphadenopathy or easy bruising Neurological:Denies numbness, tingling or new weaknesses Behavioral/Psych: Mood is stable, no new changes  All other systems were reviewed with the patient and are negative.  PHYSICAL EXAMINATION: ECOG PERFORMANCE STATUS: 2 - Symptomatic, <50% confined to bed  Vitals:   04/11/23 1209  BP: (!) 147/56  Pulse: 68  Resp:  18  Temp: 98 F (36.7 C)  SpO2: 95%   Filed Weights   04/11/23 1209  Weight: 279 lb (126.6 kg)    GENERAL:alert, no distress and comfortable.  She looks pale.  Limited examination due to her body habitus SKIN: skin color, texture, turgor are normal, no rashes or significant lesions EYES: normal, conjunctiva are pink and non-injected, sclera clear OROPHARYNX:no exudate, no erythema and lips, buccal mucosa, and tongue normal  NECK: supple, thyroid normal size, non-tender, without nodularity LYMPH:  no palpable lymphadenopathy in the cervical, axillary or inguinal LUNGS: clear to auscultation and percussion with normal breathing effort HEART: regular rate & rhythm and no murmurs and no lower extremity edema ABDOMEN:abdomen soft, non-tender and normal bowel sounds Musculoskeletal:no cyanosis of digits and no clubbing  PSYCH: alert & oriented x 3 with fluent speech NEURO: no focal motor/sensory deficits

## 2023-04-11 NOTE — Assessment & Plan Note (Signed)
She is noted to have borderline reduced kidney function, chronic kidney disease stage III likely due to her age There is possibility she can have anemia of chronic disease I will recheck iron studies and EPO level in her next visit

## 2023-04-25 ENCOUNTER — Other Ambulatory Visit: Payer: Self-pay

## 2023-04-25 ENCOUNTER — Inpatient Hospital Stay: Payer: PPO | Attending: Hematology and Oncology

## 2023-04-25 VITALS — BP 156/54 | HR 71 | Temp 98.1°F | Resp 16

## 2023-04-25 DIAGNOSIS — D500 Iron deficiency anemia secondary to blood loss (chronic): Secondary | ICD-10-CM | POA: Diagnosis not present

## 2023-04-25 DIAGNOSIS — N183 Chronic kidney disease, stage 3 unspecified: Secondary | ICD-10-CM | POA: Diagnosis not present

## 2023-04-25 MED ORDER — SODIUM CHLORIDE 0.9 % IV SOLN: Freq: Once | INTRAVENOUS | Status: AC

## 2023-04-25 MED ORDER — SODIUM CHLORIDE 0.9 % IV SOLN
510.0000 mg | Freq: Once | INTRAVENOUS | Status: AC
  Administered 2023-04-25: 510 mg via INTRAVENOUS
  Filled 2023-04-25: qty 510

## 2023-04-25 NOTE — Progress Notes (Signed)
Pt observed for 30 minutes post feraheme infusion. Tolerated well with no adverse s/s noted. VS obtained at time of discharge. Pt left via ambulation with Rolator to lobby with family.

## 2023-04-25 NOTE — Patient Instructions (Signed)

## 2023-04-27 DIAGNOSIS — D509 Iron deficiency anemia, unspecified: Secondary | ICD-10-CM | POA: Diagnosis not present

## 2023-04-27 DIAGNOSIS — K31A19 Gastric intestinal metaplasia without dysplasia, unspecified site: Secondary | ICD-10-CM | POA: Diagnosis not present

## 2023-04-27 DIAGNOSIS — K2940 Chronic atrophic gastritis without bleeding: Secondary | ICD-10-CM | POA: Diagnosis not present

## 2023-04-27 DIAGNOSIS — K2930 Chronic superficial gastritis without bleeding: Secondary | ICD-10-CM | POA: Diagnosis not present

## 2023-05-02 ENCOUNTER — Other Ambulatory Visit: Payer: Self-pay

## 2023-05-02 ENCOUNTER — Inpatient Hospital Stay: Payer: PPO

## 2023-05-02 VITALS — BP 155/60 | HR 62 | Temp 98.0°F | Resp 19

## 2023-05-02 DIAGNOSIS — D500 Iron deficiency anemia secondary to blood loss (chronic): Secondary | ICD-10-CM | POA: Diagnosis not present

## 2023-05-02 MED ORDER — SODIUM CHLORIDE 0.9 % IV SOLN: Freq: Once | INTRAVENOUS | Status: AC

## 2023-05-02 MED ORDER — SODIUM CHLORIDE 0.9 % IV SOLN
510.0000 mg | Freq: Once | INTRAVENOUS | Status: AC
  Administered 2023-05-02: 510 mg via INTRAVENOUS
  Filled 2023-05-02: qty 510

## 2023-05-02 NOTE — Patient Instructions (Signed)
Ferumoxytol Injection What is this medication? FERUMOXYTOL (FER ue MOX i tol) treats low levels of iron in your body (iron deficiency anemia). Iron is a mineral that plays an important role in making red blood cells, which carry oxygen from your lungs to the rest of your body. This medicine may be used for other purposes; ask your health care provider or pharmacist if you have questions. COMMON BRAND NAME(S): Feraheme What should I tell my care team before I take this medication? They need to know if you have any of these conditions: Anemia not caused by low iron levels High levels of iron in the blood Magnetic resonance imaging (MRI) test scheduled An unusual or allergic reaction to iron, other medications, foods, dyes, or preservatives Pregnant or trying to get pregnant Breastfeeding How should I use this medication? This medication is injected into a vein. It is given by your care team in a hospital or clinic setting. Talk to your care team the use of this medication in children. Special care may be needed. Overdosage: If you think you have taken too much of this medicine contact a poison control center or emergency room at once. NOTE: This medicine is only for you. Do not share this medicine with others. What if I miss a dose? It is important not to miss your dose. Call your care team if you are unable to keep an appointment. What may interact with this medication? Other iron products This list may not describe all possible interactions. Give your health care provider a list of all the medicines, herbs, non-prescription drugs, or dietary supplements you use. Also tell them if you smoke, drink alcohol, or use illegal drugs. Some items may interact with your medicine. What should I watch for while using this medication? Visit your care team regularly. Tell your care team if your symptoms do not start to get better or if they get worse. You may need blood work done while you are taking this  medication. You may need to follow a special diet. Talk to your care team. Foods that contain iron include: whole grains/cereals, dried fruits, beans, or peas, leafy green vegetables, and organ meats (liver, kidney). What side effects may I notice from receiving this medication? Side effects that you should report to your care team as soon as possible: Allergic reactions--skin rash, itching, hives, swelling of the face, lips, tongue, or throat Low blood pressure--dizziness, feeling faint or lightheaded, blurry vision Shortness of breath Side effects that usually do not require medical attention (report to your care team if they continue or are bothersome): Flushing Headache Joint pain Muscle pain Nausea Pain, redness, or irritation at injection site This list may not describe all possible side effects. Call your doctor for medical advice about side effects. You may report side effects to FDA at 1-800-FDA-1088. Where should I keep my medication? This medication is given in a hospital or clinic and will not be stored at home. NOTE: This sheet is a summary. It may not cover all possible information. If you have questions about this medicine, talk to your doctor, pharmacist, or health care provider.  2024 Elsevier/Gold Standard (2022-04-12 00:00:00)  

## 2023-05-02 NOTE — Progress Notes (Signed)
Pt declined to stay for 30 min post obs, observed for 15 min, ambulatory to lobby with VSS upon discharge

## 2023-05-04 DIAGNOSIS — K2940 Chronic atrophic gastritis without bleeding: Secondary | ICD-10-CM | POA: Diagnosis not present

## 2023-05-04 DIAGNOSIS — K31A19 Gastric intestinal metaplasia without dysplasia, unspecified site: Secondary | ICD-10-CM | POA: Diagnosis not present

## 2023-05-30 ENCOUNTER — Other Ambulatory Visit: Payer: Self-pay

## 2023-05-30 ENCOUNTER — Emergency Department (HOSPITAL_BASED_OUTPATIENT_CLINIC_OR_DEPARTMENT_OTHER): Payer: PPO

## 2023-05-30 ENCOUNTER — Encounter (HOSPITAL_BASED_OUTPATIENT_CLINIC_OR_DEPARTMENT_OTHER): Payer: Self-pay

## 2023-05-30 ENCOUNTER — Emergency Department (HOSPITAL_BASED_OUTPATIENT_CLINIC_OR_DEPARTMENT_OTHER): Payer: PPO | Admitting: Radiology

## 2023-05-30 ENCOUNTER — Emergency Department (HOSPITAL_BASED_OUTPATIENT_CLINIC_OR_DEPARTMENT_OTHER)
Admission: EM | Admit: 2023-05-30 | Discharge: 2023-05-30 | Disposition: A | Discharge status: Home / Self Care | Payer: PPO | Source: Home / Self Care | Attending: Emergency Medicine | Admitting: Emergency Medicine

## 2023-05-30 ENCOUNTER — Ambulatory Visit: Payer: Self-pay

## 2023-05-30 DIAGNOSIS — M79602 Pain in left arm: Secondary | ICD-10-CM | POA: Diagnosis not present

## 2023-05-30 DIAGNOSIS — S0990XA Unspecified injury of head, initial encounter: Secondary | ICD-10-CM | POA: Diagnosis not present

## 2023-05-30 DIAGNOSIS — R9082 White matter disease, unspecified: Secondary | ICD-10-CM | POA: Diagnosis not present

## 2023-05-30 DIAGNOSIS — M85812 Other specified disorders of bone density and structure, left shoulder: Secondary | ICD-10-CM | POA: Diagnosis not present

## 2023-05-30 DIAGNOSIS — R079 Chest pain, unspecified: Secondary | ICD-10-CM | POA: Diagnosis not present

## 2023-05-30 DIAGNOSIS — I700 Atherosclerosis of aorta: Secondary | ICD-10-CM | POA: Diagnosis not present

## 2023-05-30 DIAGNOSIS — K219 Gastro-esophageal reflux disease without esophagitis: Secondary | ICD-10-CM | POA: Diagnosis not present

## 2023-05-30 DIAGNOSIS — Z96611 Presence of right artificial shoulder joint: Secondary | ICD-10-CM | POA: Diagnosis not present

## 2023-05-30 DIAGNOSIS — Z981 Arthrodesis status: Secondary | ICD-10-CM | POA: Diagnosis not present

## 2023-05-30 DIAGNOSIS — Z043 Encounter for examination and observation following other accident: Secondary | ICD-10-CM | POA: Diagnosis not present

## 2023-05-30 DIAGNOSIS — D509 Iron deficiency anemia, unspecified: Secondary | ICD-10-CM | POA: Diagnosis not present

## 2023-05-30 DIAGNOSIS — W19XXXA Unspecified fall, initial encounter: Secondary | ICD-10-CM

## 2023-05-30 DIAGNOSIS — M25712 Osteophyte, left shoulder: Secondary | ICD-10-CM | POA: Diagnosis not present

## 2023-05-30 DIAGNOSIS — M19012 Primary osteoarthritis, left shoulder: Secondary | ICD-10-CM | POA: Diagnosis not present

## 2023-05-30 DIAGNOSIS — M25512 Pain in left shoulder: Secondary | ICD-10-CM | POA: Diagnosis not present

## 2023-05-30 NOTE — ED Notes (Signed)
Patient up to restroom with assistance of walker.

## 2023-05-30 NOTE — Telephone Encounter (Signed)
[name] coming out of GI office and was holding walker- and the walker took off and fell and hit head - No loc//head hurts left shoulder - no speech, weakness or hurts //  5=6 constant- blood thinner - fell 15 minutes - repeated story back once again and recalled and was thankful to bystanders This encounter was created in error - please disregard.

## 2023-05-30 NOTE — ED Provider Notes (Addendum)
[place] [facility]MENT AT [facility] Provider Note   CSN: [id number] Arrival date & time: 05/30/23  1450     History  Chief Complaint  Patient presents with   Fall    Blood thinner   Shoulder Injury    left    [name] is a 77 y.o. female.  Patient was leaving her doctor's office today when she fell with her walker.  Patient stumbled.  Landing on her left side hitting her left side of her head and left shoulder.  No loss of consciousness.  No concerns about hip or leg injury.  Patient is on Xarelto has been on Xarelto since 2008 following DVT and pulmonary embolus.  Her primary care doctor has wanted to keep her on it.  Patient has some mobility issues particularly some limitation of range of motion of the right shoulder area.  Following shoulder replacement.  Past medical history significant for high cholesterol hypertension pulmonary embolism deep vein thrombosis in 2008.       Home Medications Prior to Admission medications   Medication Sig Start Date End Date Taking? Authorizing Provider  ALPRAZolam Prudy Feeler) 0.5 MG tablet Take 0.5 mg by mouth 2 (two) times daily. 07/17/13   [provider]  Ascorbic Acid (VITAMIN C) 250 MG CHEW Chew 500 mg by mouth daily.    [provider]  atenolol (TENORMIN) 25 MG tablet Take 25 mg by mouth See admin instructions. Take one tablet (25 mg) by mouth every morning, may also take a 2nd tablet (25 mg) if SBP >150 06/11/13   [provider]  Bacillus Coagulans-Inulin (PROBIOTIC-PREBIOTIC PO) Take 1 tablet by mouth in the morning.    [provider]  baclofen (LIORESAL) 10 MG tablet Take 1 tablet (10 mg total) by mouth every 8 (eight) hours. 12/07/13   Lanney Gins, PA-C  escitalopram (LEXAPRO) 5 MG tablet Take 2.5 mg by mouth at bedtime. 01/22/20   [provider]  famotidine (PEPCID) 40 MG tablet Take 40 mg by mouth at bedtime. 08/22/18   [provider]  ferrous sulfate  325 (65 FE) MG EC tablet Take 325 mg by mouth 2 (two) times daily.    [provider]  fluticasone (FLONASE) 50 MCG/ACT nasal spray Place 1 spray into both nostrils daily as needed for allergies or rhinitis.    [provider]  furosemide (LASIX) 40 MG tablet Take 1 tablet (40 mg total) by mouth 2 (two) times daily. 04/11/23   Artis Delay, MD  gabapentin (NEURONTIN) 600 MG tablet Take 600 mg by mouth 3 (three) times daily. 05/25/13   [provider]  gabapentin (NEURONTIN) 600 MG tablet Take 1 tablet by mouth 3 (three) times daily.    [provider]  Histamine Dihydrochloride (AUSTRALIAN DREAM ARTHRITIS) 0.025 % CREA Apply 1 application topically 4 (four) times daily as needed (spasms/joint pain.).    [provider]  HYDROcodone-acetaminophen (NORCO) 10-325 MG tablet Take 1 tablet by mouth every 3 (three) hours as needed for moderate pain or severe pain. 10/09/21   Barnett Abu, MD  levothyroxine (SYNTHROID, LEVOTHROID) 50 MCG tablet Take 25 mcg by mouth every Monday, Wednesday, and Friday. Skip Sunday, Tuesday, Thursday, Saturday 08/09/18   [provider]  losartan (COZAAR) 50 MG tablet Take 50 mg by mouth 2 (two) times daily. 12/13/19   [provider]  montelukast (SINGULAIR) 10 MG tablet Take 10 mg by mouth daily as needed.    [provider]  Multiple  Vitamins-Minerals (MULTI + OMEGA-3 ADULT GUMMIES PO) Take 2 tablets by mouth in the morning.    [provider]  omeprazole (PRILOSEC) 40 MG capsule Take 40 mg by mouth daily.    [provider]  promethazine (PHENERGAN) 25 MG tablet Take 25 mg by mouth every 8 (eight) hours as needed for nausea. 10/27/22   [provider]  Rivaroxaban (XARELTO) 20 MG TABS tablet Take 20 mg by mouth daily at 4 PM. 08/11/13   Early, Kristen Loader, MD  simvastatin (ZOCOR) 20 MG tablet Take 20 mg by mouth at bedtime. 05/28/13   [provider]      Allergies     Celecoxib    Review of Systems   Review of Systems  Constitutional:  Negative for chills and fever.  HENT:  Negative for ear pain and sore throat.   Eyes:  Negative for pain and visual disturbance.  Respiratory:  Negative for cough and shortness of breath.   Cardiovascular:  Negative for chest pain and palpitations.  Gastrointestinal:  Negative for abdominal pain and vomiting.  Genitourinary:  Negative for dysuria and hematuria.  Musculoskeletal:  Negative for arthralgias and back pain.  Skin:  Negative for color change and rash.  Neurological:  Negative for seizures, syncope and headaches.  Hematological:  Bruises/bleeds easily.  All other systems reviewed and are negative.   Physical Exam Updated Vital Signs BP (!) 173/65   Pulse (!) 57   Temp 98 F (36.7 C)   Resp 16   Ht 1.6 m (5\' 3" )   Wt 127.5 kg   LMP  (LMP Unknown)   SpO2 97%   BMI 49.78 kg/m  Physical Exam Vitals and nursing note reviewed.  Constitutional:      General: She is not in acute distress.    Appearance: Normal appearance. She is well-developed.  HENT:     Head: Normocephalic and atraumatic.     Comments: No obvious trauma to the left side of the head or face. Eyes:     Extraocular Movements: Extraocular movements intact.     Conjunctiva/sclera: Conjunctivae normal.     Pupils: Pupils are equal, round, and reactive to light.  Cardiovascular:     Rate and Rhythm: Normal rate and regular rhythm.     Heart sounds: No murmur heard. Pulmonary:     Effort: Pulmonary effort is normal. No respiratory distress.     Breath sounds: Normal breath sounds.  Abdominal:     Palpations: Abdomen is soft.     Tenderness: There is no abdominal tenderness.  Musculoskeletal:        General: Tenderness present. No swelling.     Cervical back: Normal range of motion and neck supple. No rigidity or tenderness.     Comments: Tenderness to the proximal left humerus.  No evidence of shoulder dislocation clinically  clavicle seems to be intact.  Good range of motion of the elbow good range of motion at the wrist.  Radial pulse is 1+.  Good cap refill.  Sensation intact to the fingers of the left hand.  No thoracic or lumbar back tenderness to palpation.  Skin:    General: Skin is warm and dry.     Capillary Refill: Capillary refill takes less than 2 seconds.  Neurological:     Mental Status: She is alert and oriented to person, place, and time. Mental status is at baseline.  Psychiatric:        Mood and Affect: Mood normal.  ED Results / Procedures / Treatments   Labs (all labs ordered are listed, but only abnormal results are displayed) Labs Reviewed - No data to display  EKG None  Radiology CT HUMERUS LEFT WO CONTRAST  Result Date: 05/30/2023 CLINICAL DATA:  Fall, left shoulder pain, negative radiographs EXAM: CT OF THE UPPER LEFT EXTREMITY WITHOUT CONTRAST TECHNIQUE: Multidetector CT imaging of the upper left extremity was performed according to the standard protocol. RADIATION DOSE REDUCTION: This exam was performed according to the departmental dose-optimization program which includes automated exposure control, adjustment of the mA and/or kV according to patient size and/or use of iterative reconstruction technique. COMPARISON:  None Available. FINDINGS: No fracture or dislocation is seen. Mild degenerative changes of the glenohumeral joint. Decreased subacromial space, suggesting chronic rotator cuff injury. No fluid collection/abscess. IMPRESSION: No fracture or dislocation is seen. Mild degenerative changes, as above. Electronically Signed   By: Charline Bills M.D.   On: 05/30/2023 19:38   DG Humerus Left  Result Date: 05/30/2023 CLINICAL DATA:  Fall EXAM: LEFT HUMERUS - 2+ VIEW COMPARISON:  Shoulder radiograph 05/30/2023 FINDINGS: Heterogenous appearance of the left humeral head/neck. Mid to distal humerus demonstrate no fracture or malalignment. No dislocation is evident. High-riding  humeral head consistent with rotator cuff disease. IMPRESSION: Heterogenous appearance of the left humeral head/neck without convincing acute fracture lucency, however if persistent clinical concern for acute osseous abnormality, would correlate with CT. Electronically Signed   By: Jasmine Pang M.D.   On: 05/30/2023 18:04   CT Head Wo Contrast  Result Date: 05/30/2023 CLINICAL DATA:  Trip and fall EXAM: CT HEAD WITHOUT CONTRAST CT CERVICAL SPINE WITHOUT CONTRAST TECHNIQUE: Multidetector CT imaging of the head and cervical spine was performed following the standard protocol without intravenous contrast. Multiplanar CT image reconstructions of the cervical spine were also generated. RADIATION DOSE REDUCTION: This exam was performed according to the departmental dose-optimization program which includes automated exposure control, adjustment of the mA and/or kV according to patient size and/or use of iterative reconstruction technique. COMPARISON:  01/14/2023 FINDINGS: CT HEAD FINDINGS Brain: No evidence of acute infarction, hemorrhage, hydrocephalus, extra-axial collection or mass lesion/mass effect. Mild periventricular white matter hypodensity. Vascular: No hyperdense vessel or unexpected calcification. Skull: Normal. Negative for fracture or focal lesion. Sinuses/Orbits: No acute finding. Other: None. CT CERVICAL SPINE FINDINGS Alignment: Degenerative and postoperative straightening and reversal of the normal cervical lordosis. Skull base and vertebrae: No acute fracture. No primary bone lesion or focal pathologic process. Soft tissues and spinal canal: No prevertebral fluid or swelling. No visible canal hematoma. Disc levels: Status post anterior cervical discectomy and fusion of C3 through C5. Severe disc degenerative disease of C5-C7. Upper chest: Negative. Other: None. IMPRESSION: 1. No acute intracranial pathology. Small-vessel white matter disease. 2. No fracture or static subluxation of the cervical  spine. 3. Status post anterior cervical discectomy and fusion of C3-C5. Severe disc degenerative disease of C5-C7. Electronically Signed   By: Jearld Lesch M.D.   On: 05/30/2023 16:49   CT Cervical Spine Wo Contrast  Result Date: 05/30/2023 CLINICAL DATA:  Trip and fall EXAM: CT HEAD WITHOUT CONTRAST CT CERVICAL SPINE WITHOUT CONTRAST TECHNIQUE: Multidetector CT imaging of the head and cervical spine was performed following the standard protocol without intravenous contrast. Multiplanar CT image reconstructions of the cervical spine were also generated. RADIATION DOSE REDUCTION: This exam was performed according to the departmental dose-optimization program which includes automated exposure control, adjustment of the mA and/or kV according to patient size  and/or use of iterative reconstruction technique. COMPARISON:  01/14/2023 FINDINGS: CT HEAD FINDINGS Brain: No evidence of acute infarction, hemorrhage, hydrocephalus, extra-axial collection or mass lesion/mass effect. Mild periventricular white matter hypodensity. Vascular: No hyperdense vessel or unexpected calcification. Skull: Normal. Negative for fracture or focal lesion. Sinuses/Orbits: No acute finding. Other: None. CT CERVICAL SPINE FINDINGS Alignment: Degenerative and postoperative straightening and reversal of the normal cervical lordosis. Skull base and vertebrae: No acute fracture. No primary bone lesion or focal pathologic process. Soft tissues and spinal canal: No prevertebral fluid or swelling. No visible canal hematoma. Disc levels: Status post anterior cervical discectomy and fusion of C3 through C5. Severe disc degenerative disease of C5-C7. Upper chest: Negative. Other: None. IMPRESSION: 1. No acute intracranial pathology. Small-vessel white matter disease. 2. No fracture or static subluxation of the cervical spine. 3. Status post anterior cervical discectomy and fusion of C3-C5. Severe disc degenerative disease of C5-C7. Electronically  Signed   By: Jearld Lesch M.D.   On: 05/30/2023 16:49   DG Chest 1 View  Result Date: 05/30/2023 CLINICAL DATA:  Pain after fall.  Patient on blood thinners EXAM: CHEST  1 VIEW COMPARISON:  Chest x-ray 10/01/2021 FINDINGS: No consolidation, pneumothorax or effusion. No edema normal cardiopericardial silhouette. Calcified aorta. Fixation hardware along the spine at multiple locations. Right shoulder reverse arthroplasty as well. IMPRESSION: No acute cardiopulmonary disease. Electronically Signed   By: Karen Kays M.D.   On: 05/30/2023 16:39   DG Shoulder Left  Result Date: 05/30/2023 CLINICAL DATA:  Pain after fall.  Patient on blood thinners EXAM: LEFT SHOULDER - 3 VIEW COMPARISON:  05/16/2014 FINDINGS: Osteopenia. Joint space loss of the glenohumeral joint and AC joint with small osteophytes. No acute fracture or dislocation. Elevated humeral head. Please correlate for any evidence of a rotator cuff tear. IMPRESSION: Osteopenia.  Moderate degenerative change Electronically Signed   By: Karen Kays M.D.   On: 05/30/2023 16:36    Procedures Procedures    Medications Ordered in ED Medications - No data to display  ED Course/ Medical Decision Making/ A&P                                 Medical Decision Making Amount and/or Complexity of Data Reviewed Radiology: ordered.   X-ray of the left shoulder moderate degenerative changes osteopenia.  No bony abnormalities.  Chest x-ray pending CT head and neck pending.  X-ray of the shoulder is a little bit limited to where some of her humerus tenderness is.  May do regular humerus x-ray.  X-ray of the humerus raise concerns about possible fracture lucency.  So we will do CT scan without.  Patient's CT head without any acute findings and also CT cervical spine without any acute findings.  CT of left humerus without evidence of any bony abnormality.  Will offer patient sling if she needs it and then follow-up with her doctors.  Patient stable  for discharge home.  Patient tends to use a walker so sling will not be very helpful to her.  Patient followed by [facility] orthopedics.  She will call and make an appointment.   Final Clinical Impression(s) / ED Diagnoses Final diagnoses:  Fall, initial encounter  Minor head injury, initial encounter  Pain of left upper extremity    Rx / DC Orders ED Discharge Orders     None         Vanetta Mulders, MD 05/30/23  1643    Vanetta Mulders, MD 05/30/23 1910    Vanetta Mulders, MD 05/30/23 Rushie Goltz    Vanetta Mulders, MD 05/30/23 1958

## 2023-05-30 NOTE — Discharge Instructions (Addendum)
Make an appointment follow-up with your doctor.  CT scan shows no evidence of any fracture to the left arm.  Other CT scans head neck without any acute findings.  Follow-up with your orthopedic doctor.

## 2023-05-30 NOTE — Telephone Encounter (Signed)
  Chief Complaint: fall Symptoms: left shoulder pain/ left side of head near temple pain, headache 6/10 Frequency: 15 minutes before call  Pertinent Negatives: Patient denies LOC, speech or vision changes, dizziness, nausea Disposition: [x] ED /[] Urgent Care (no appt availability in office) / [] Appointment(In office/virtual)/ []  [facility]/ [] Home Care/ [] Refused Recommended Disposition /[] [facility]/ []  Follow-up with PCP Additional Notes: pt called enroute to ED [name] coming out of GI office and was holding walker- and the walker took off and fell and hit head - No loc//head hurts left shoulder - no speech, weakness or hurts //  5=6 constant- blood thinner - fell 15 minutes - repeated story back once again and recalled and was thankful to bystanders  Reason for Disposition  Taking Coumadin (warfarin) or other strong blood thinner, or known bleeding disorder (e.g., thrombocytopenia)  Answer Assessment - Initial Assessment Questions 1. MECHANISM: "How did the injury happen?" For falls, ask: "What height did you fall from?" and "What surface did you fall against?"      Walker went down ramp pt lost balance and fell  2. ONSET: "When did the injury happen?" (Minutes or hours ago)      15 minutes before call 3. NEUROLOGIC SYMPTOMS: "Was there any loss of consciousness?" "Are there any other neurological symptoms?"      No/no 4. MENTAL STATUS: "Does the person know who they are, who you are, and where they are?"      yes 5. LOCATION: "What part of the head was hit?"      Left temple 6. SCALP APPEARANCE: "What does the scalp look like? Is it bleeding now?" If Yes, ask: "Is it difficult to stop?"      unremarkable 7. SIZE: For cuts, bruises, or swelling, ask: "How large is it?" (e.g., inches or centimeters)      N/a 8. PAIN: "Is there any pain?" If Yes, ask: "How bad is it?"  (e.g., Scale 1-10; or mild, moderate, severe)     moderate 10. OTHER SYMPTOMS: "Do you have any  other symptoms?" (e.g., neck pain, vomiting)       Left shoulder pain 11. PREGNANCY: "Is there any chance you are pregnant?" "When was your last menstrual period?"       N/a  Protocols used: Head Injury-A-AH

## 2023-05-30 NOTE — ED Notes (Signed)
Reviewed AVS with patient, patient expressed understanding of directions, denies further questions at this time. 

## 2023-05-30 NOTE — ED Triage Notes (Addendum)
Pt to ED via POV c/o fall. Reports was walking out of doctors office with walker and trip and fell. Pt did hit head. No LOC. C/O left shoulder pain. Takes Xarelto

## 2023-06-03 ENCOUNTER — Inpatient Hospital Stay: Payer: PPO | Attending: Hematology and Oncology

## 2023-06-03 ENCOUNTER — Other Ambulatory Visit: Payer: Self-pay

## 2023-06-03 DIAGNOSIS — D500 Iron deficiency anemia secondary to blood loss (chronic): Secondary | ICD-10-CM | POA: Diagnosis not present

## 2023-06-03 DIAGNOSIS — Z86711 Personal history of pulmonary embolism: Secondary | ICD-10-CM | POA: Insufficient documentation

## 2023-06-03 DIAGNOSIS — D539 Nutritional anemia, unspecified: Secondary | ICD-10-CM

## 2023-06-03 DIAGNOSIS — N183 Chronic kidney disease, stage 3 unspecified: Secondary | ICD-10-CM | POA: Insufficient documentation

## 2023-06-03 DIAGNOSIS — N182 Chronic kidney disease, stage 2 (mild): Secondary | ICD-10-CM

## 2023-06-03 LAB — CBC WITH DIFFERENTIAL (CANCER CENTER ONLY)
Abs Immature Granulocytes: 0 10*3/uL (ref 0.00–0.07)
Basophils Absolute: 0 10*3/uL (ref 0.0–0.1)
Basophils Relative: 0 %
Eosinophils Absolute: 0.1 10*3/uL (ref 0.0–0.5)
Eosinophils Relative: 2 %
HCT: 33.4 % — ABNORMAL LOW (ref 36.0–46.0)
Hemoglobin: 11.2 g/dL — ABNORMAL LOW (ref 12.0–15.0)
Immature Granulocytes: 0 %
Lymphocytes Relative: 20 %
Lymphs Abs: 1.2 10*3/uL (ref 0.7–4.0)
MCH: 30 pg (ref 26.0–34.0)
MCHC: 33.5 g/dL (ref 30.0–36.0)
MCV: 89.5 fL (ref 80.0–100.0)
Monocytes Absolute: 0.7 10*3/uL (ref 0.1–1.0)
Monocytes Relative: 11 %
Neutro Abs: 4 10*3/uL (ref 1.7–7.7)
Neutrophils Relative %: 67 %
Platelet Count: 191 10*3/uL (ref 150–400)
RBC: 3.73 MIL/uL — ABNORMAL LOW (ref 3.87–5.11)
RDW: 15.5 % (ref 11.5–15.5)
WBC Count: 6.1 10*3/uL (ref 4.0–10.5)
nRBC: 0 % (ref 0.0–0.2)

## 2023-06-03 LAB — BASIC METABOLIC PANEL - CANCER CENTER ONLY
Anion gap: 6 (ref 5–15)
BUN: 24 mg/dL — ABNORMAL HIGH (ref 8–23)
CO2: 28 mmol/L (ref 22–32)
Calcium: 9.7 mg/dL (ref 8.9–10.3)
Chloride: 99 mmol/L (ref 98–111)
Creatinine: 1.29 mg/dL — ABNORMAL HIGH (ref 0.44–1.00)
GFR, Estimated: 43 mL/min — ABNORMAL LOW (ref >60)
Glucose, Bld: 109 mg/dL — ABNORMAL HIGH (ref 70–99)
Potassium: 4.5 mmol/L (ref 3.5–5.1)
Sodium: 133 mmol/L — ABNORMAL LOW (ref 135–145)

## 2023-06-03 LAB — RETICULOCYTES
Immature Retic Fract: 10.7 % (ref 2.3–15.9)
RBC.: 3.85 MIL/uL — ABNORMAL LOW (ref 3.87–5.11)
Retic Count, Absolute: 68.9 10*3/uL (ref 19.0–186.0)
Retic Ct Pct: 1.8 % (ref 0.4–3.1)

## 2023-06-03 LAB — ABO/RH: ABO/RH(D): A POS

## 2023-06-03 LAB — VITAMIN B12: Vitamin B-12: 219 pg/mL (ref 180–914)

## 2023-06-03 LAB — FERRITIN: Ferritin: 133 ng/mL (ref 11–307)

## 2023-06-03 LAB — SEDIMENTATION RATE: Sed Rate: 6 mm/hr (ref 0–22)

## 2023-06-04 LAB — ERYTHROPOIETIN: Erythropoietin: 9.8 m[IU]/mL (ref 2.6–18.5)

## 2023-06-06 ENCOUNTER — Telehealth: Payer: Self-pay

## 2023-06-06 NOTE — Telephone Encounter (Signed)
Transition Care Management Follow-up Telephone Call Date of discharge and from where: [place] 8/12 How have you been since you were released from the hospital? Doing better just a sore shoulder Any questions or concerns? No  Items Reviewed: Did the pt receive and understand the discharge instructions provided? Yes  Medications obtained and verified? No  Other? No  Any new allergies since your discharge? No  Dietary orders reviewed? No Do you have support at home? Yes     Follow up appointments reviewed:  PCP Hospital f/u appt confirmed? Yes  Scheduled to see PCP on THE PHONE  @ . Specialist Hospital f/u appt confirmed? No  Scheduled to see  on  @ . Are transportation arrangements needed? No  If their condition worsens, is the pt aware to call PCP or go to the Emergency Dept.? No Was the patient provided with contact information for the PCP's office or ED? No Was to pt encouraged to call back with questions or concerns? No

## 2023-06-09 ENCOUNTER — Encounter: Payer: Self-pay | Admitting: Hematology and Oncology

## 2023-06-09 ENCOUNTER — Inpatient Hospital Stay: Payer: PPO | Admitting: Hematology and Oncology

## 2023-06-09 VITALS — BP 143/58 | HR 61 | Resp 18 | Ht 63.0 in | Wt 288.0 lb

## 2023-06-09 DIAGNOSIS — N1831 Chronic kidney disease, stage 3a: Secondary | ICD-10-CM

## 2023-06-09 DIAGNOSIS — D500 Iron deficiency anemia secondary to blood loss (chronic): Secondary | ICD-10-CM

## 2023-06-09 DIAGNOSIS — Z86718 Personal history of other venous thrombosis and embolism: Secondary | ICD-10-CM | POA: Diagnosis not present

## 2023-06-09 NOTE — Assessment & Plan Note (Signed)
She is noted to have borderline reduced kidney function, chronic kidney disease stage III likely due to her age There is possibility she can have anemia of chronic disease With a hemoglobin above 11, she does not qualify for ESA We can consider ESA injections in the future if needed

## 2023-06-09 NOTE — Assessment & Plan Note (Signed)
She had history of provoked DVT but due to her reduced mobility, I am in agreement with her primary care doctor for her to remain on Xarelto for secondary prevention

## 2023-06-09 NOTE — Progress Notes (Signed)
[facility] OFFICE PROGRESS NOTE  Gail Corn, MD  ASSESSMENT & PLAN:  Iron deficiency anemia due to chronic blood loss Her iron deficiency anemia has resolved She has persistent anemia secondary to component of anemia of chronic kidney disease She does not need ESA I recommend close monitoring and follow-up with her primary care doctor with iron panel every 3 to 6 months for the next few years If she needs iron infusion again, she will call me and let me know  Chronic kidney disease, stage III (moderate) (HCC) She is noted to have borderline reduced kidney function, chronic kidney disease stage III likely due to her age There is possibility she can have anemia of chronic disease With a hemoglobin above 11, she does not qualify for ESA We can consider ESA injections in the future if needed  History of DVT (deep vein thrombosis) She had history of provoked DVT but due to her reduced mobility, I am in agreement with her primary care doctor for her to remain on Xarelto for secondary prevention  No orders of the defined types were placed in this encounter.   The total time spent in the appointment was 25 minutes encounter with patients including review of chart and various tests results, discussions about plan of care and coordination of care plan   All questions were answered. The patient knows to call the clinic with any problems, questions or concerns. No barriers to learning was detected.    Artis Delay, MD 8/22/202412:00 PM  INTERVAL HISTORY: [name] 77 y.o. female returns for further follow-up and review of test results She is here accompanied by her husband She had recent fall requiring ER visit but did not have major injuries She went and had repeat EGD evaluation with her gastroenterologist since last time I saw her.  I do not have a copy of her report but she was told she does not need to return and all findings were within normal limits She  tolerated intravenous iron infusion well We discussed test results and future follow-up  SUMMARY OF HEMATOLOGIC HISTORY:  [name] 77 y.o. female is here because of anemia  She was found to have abnormal CBC from recent blood work I have the opportunity to review her electronic records dated back to 2008 Her hemoglobin was as low as 8.6 in April 2009 Her last normal blood count was on August 18, 2018 with a hemoglobin of 12.8 Most recently, on April 08, 2023, she have hemoglobin of 10.1, MCV of 88, platelet count 311 and her white count was 6.6 She is noted to have borderline renal function with a creatinine of 1.3 Iron studies came back abnormal with ferritin of 13  She denies recent chest pain on exertion, but does complain of shortness of breath on minimal exertion.  She complained of excessive fatigue but denies pre-syncopal episodes, or palpitations. She had not noticed any recent bleeding such as epistaxis, hematuria or hematochezia The patient denies over the counter NSAID ingestion. She is on chronic anticoagulation therapy for many years due to history of provoked DVT and PE She remained on anticoagulation therapy for secondary prevention due to poor mobility Her last colonoscopy was in 2019 She had both EGD and colonoscopy performed, results were within normal range She had no prior history or diagnosis of cancer. Her age appropriate screening programs are up-to-date. She has pica with ice craving.  She eats a variety of diet but in general does not eat  a lot of meat. She never donated blood but had blood transfusion in the past She received 2 doses of intravenous iron Feraheme in July.  Oral iron was discontinued.  She had repeat EGD evaluation in July 2024  I have reviewed the past medical history, past surgical history, social history and family history with the patient and they are unchanged from previous note.  ALLERGIES:  is allergic to celecoxib.  MEDICATIONS:   Current Outpatient Medications  Medication Sig Dispense Refill   ALPRAZolam (XANAX) 0.5 MG tablet Take 0.5 mg by mouth 2 (two) times daily.     Ascorbic Acid (VITAMIN C) 250 MG CHEW Chew 500 mg by mouth daily.     atenolol (TENORMIN) 25 MG tablet Take 25 mg by mouth See admin instructions. Take one tablet (25 mg) by mouth every morning, may also take a 2nd tablet (25 mg) if SBP >150     Bacillus Coagulans-Inulin (PROBIOTIC-PREBIOTIC PO) Take 1 tablet by mouth in the morning.     baclofen (LIORESAL) 10 MG tablet Take 1 tablet (10 mg total) by mouth every 8 (eight) hours. 30 each 0   escitalopram (LEXAPRO) 5 MG tablet Take 2.5 mg by mouth at bedtime.     famotidine (PEPCID) 40 MG tablet Take 40 mg by mouth at bedtime.  3   fluticasone (FLONASE) 50 MCG/ACT nasal spray Place 1 spray into both nostrils daily as needed for allergies or rhinitis.     furosemide (LASIX) 40 MG tablet Take 1 tablet (40 mg total) by mouth 2 (two) times daily.     gabapentin (NEURONTIN) 600 MG tablet Take 600 mg by mouth 3 (three) times daily.     gabapentin (NEURONTIN) 600 MG tablet Take 1 tablet by mouth 3 (three) times daily.     Histamine Dihydrochloride (AUSTRALIAN DREAM ARTHRITIS) 0.025 % CREA Apply 1 application topically 4 (four) times daily as needed (spasms/joint pain.).     HYDROcodone-acetaminophen (NORCO) 10-325 MG tablet Take 1 tablet by mouth every 3 (three) hours as needed for moderate pain or severe pain. 50 tablet 0   levothyroxine (SYNTHROID, LEVOTHROID) 50 MCG tablet Take 25 mcg by mouth every Monday, Wednesday, and Friday. Skip Sunday, Tuesday, Thursday, Saturday  1   losartan (COZAAR) 50 MG tablet Take 50 mg by mouth 2 (two) times daily.     montelukast (SINGULAIR) 10 MG tablet Take 10 mg by mouth daily as needed.     Multiple Vitamins-Minerals (MULTI + OMEGA-3 ADULT GUMMIES PO) Take 2 tablets by mouth in the morning.     omeprazole (PRILOSEC) 40 MG capsule Take 40 mg by mouth daily.     promethazine  (PHENERGAN) 25 MG tablet Take 25 mg by mouth every 8 (eight) hours as needed for nausea.     Rivaroxaban (XARELTO) 20 MG TABS tablet Take 20 mg by mouth daily at 4 PM.     simvastatin (ZOCOR) 20 MG tablet Take 20 mg by mouth at bedtime.     No current facility-administered medications for this visit.     REVIEW OF SYSTEMS:   Constitutional: Denies fevers, chills or night sweats Eyes: Denies blurriness of vision Ears, nose, mouth, throat, and face: Denies mucositis or sore throat Respiratory: Denies cough, dyspnea or wheezes Cardiovascular: Denies palpitation, chest discomfort or lower extremity swelling Gastrointestinal:  Denies nausea, heartburn or change in bowel habits Skin: Denies abnormal skin rashes Lymphatics: Denies new lymphadenopathy or easy bruising Neurological:Denies numbness, tingling or new weaknesses Behavioral/Psych: Mood is stable, no new  changes  All other systems were reviewed with the patient and are negative.  PHYSICAL EXAMINATION: ECOG PERFORMANCE STATUS: 1 - Symptomatic but completely ambulatory  Vitals:   06/09/23 1131  BP: (!) 143/58  Pulse: 61  Resp: 18  SpO2: 95%   Filed Weights   06/09/23 1131  Weight: 288 lb (130.6 kg)    GENERAL:alert, no distress and comfortable NEURO: alert & oriented x 3 with fluent speech, no focal motor/sensory deficits  LABORATORY DATA:  I have reviewed the data as listed     Component Value Date/Time   NA 133 (L) 06/03/2023 1129   K 4.5 06/03/2023 1129   CL 99 06/03/2023 1129   CO2 28 06/03/2023 1129   GLUCOSE 109 (H) 06/03/2023 1129   BUN 24 (H) 06/03/2023 1129   CREATININE 1.29 (H) 06/03/2023 1129   CREATININE 0.82 07/24/2013 1712   CALCIUM 9.7 06/03/2023 1129   PROT 6.8 10/01/2021 1934   ALBUMIN 3.8 10/01/2021 1934   AST 27 10/01/2021 1934   ALT 16 10/01/2021 1934   ALKPHOS 52 10/01/2021 1934   BILITOT 0.6 10/01/2021 1934   GFRNONAA 43 (L) 06/03/2023 1129   GFRAA >60 08/19/2018 0507    No results  found for: "SPEP", "UPEP"  Lab Results  Component Value Date   WBC 6.1 06/03/2023   NEUTROABS 4.0 06/03/2023   HGB 11.2 (L) 06/03/2023   HCT 33.4 (L) 06/03/2023   MCV 89.5 06/03/2023   PLT 191 06/03/2023      Chemistry      Component Value Date/Time   NA 133 (L) 06/03/2023 1129   K 4.5 06/03/2023 1129   CL 99 06/03/2023 1129   CO2 28 06/03/2023 1129   BUN 24 (H) 06/03/2023 1129   CREATININE 1.29 (H) 06/03/2023 1129   CREATININE 0.82 07/24/2013 1712      Component Value Date/Time   CALCIUM 9.7 06/03/2023 1129   ALKPHOS 52 10/01/2021 1934   AST 27 10/01/2021 1934   ALT 16 10/01/2021 1934   BILITOT 0.6 10/01/2021 1934

## 2023-06-09 NOTE — Assessment & Plan Note (Signed)
Her iron deficiency anemia has resolved She has persistent anemia secondary to component of anemia of chronic kidney disease She does not need ESA I recommend close monitoring and follow-up with her primary care doctor with iron panel every 3 to 6 months for the next few years If she needs iron infusion again, she will call me and let me know

## 2023-06-17 ENCOUNTER — Other Ambulatory Visit: Payer: Self-pay | Admitting: Obstetrics and Gynecology

## 2023-06-17 DIAGNOSIS — N6320 Unspecified lump in the left breast, unspecified quadrant: Secondary | ICD-10-CM

## 2023-06-27 DIAGNOSIS — Z7901 Long term (current) use of anticoagulants: Secondary | ICD-10-CM | POA: Diagnosis not present

## 2023-06-27 DIAGNOSIS — N1832 Chronic kidney disease, stage 3b: Secondary | ICD-10-CM | POA: Diagnosis not present

## 2023-06-27 DIAGNOSIS — R0602 Shortness of breath: Secondary | ICD-10-CM | POA: Diagnosis not present

## 2023-06-27 DIAGNOSIS — I350 Nonrheumatic aortic (valve) stenosis: Secondary | ICD-10-CM | POA: Diagnosis not present

## 2023-06-27 DIAGNOSIS — Z86711 Personal history of pulmonary embolism: Secondary | ICD-10-CM | POA: Diagnosis not present

## 2023-06-27 DIAGNOSIS — D649 Anemia, unspecified: Secondary | ICD-10-CM | POA: Diagnosis not present

## 2023-06-27 DIAGNOSIS — I129 Hypertensive chronic kidney disease with stage 1 through stage 4 chronic kidney disease, or unspecified chronic kidney disease: Secondary | ICD-10-CM | POA: Diagnosis not present

## 2023-06-27 DIAGNOSIS — R600 Localized edema: Secondary | ICD-10-CM | POA: Diagnosis not present

## 2023-06-27 DIAGNOSIS — I517 Cardiomegaly: Secondary | ICD-10-CM | POA: Diagnosis not present

## 2023-06-27 DIAGNOSIS — M25512 Pain in left shoulder: Secondary | ICD-10-CM | POA: Diagnosis not present

## 2023-06-27 DIAGNOSIS — E871 Hypo-osmolality and hyponatremia: Secondary | ICD-10-CM | POA: Diagnosis not present

## 2023-08-01 ENCOUNTER — Ambulatory Visit
Admission: RE | Admit: 2023-08-01 | Discharge: 2023-08-01 | Disposition: A | Discharge status: Home / Self Care | Payer: PPO | Source: Ambulatory Visit | Attending: Obstetrics and Gynecology | Admitting: Obstetrics and Gynecology

## 2023-08-01 DIAGNOSIS — N6320 Unspecified lump in the left breast, unspecified quadrant: Secondary | ICD-10-CM

## 2023-08-01 DIAGNOSIS — N6325 Unspecified lump in the left breast, overlapping quadrants: Secondary | ICD-10-CM | POA: Diagnosis not present

## 2023-08-23 DIAGNOSIS — D649 Anemia, unspecified: Secondary | ICD-10-CM | POA: Diagnosis not present

## 2023-08-23 DIAGNOSIS — Z1212 Encounter for screening for malignant neoplasm of rectum: Secondary | ICD-10-CM | POA: Diagnosis not present

## 2023-08-23 DIAGNOSIS — E781 Pure hyperglyceridemia: Secondary | ICD-10-CM | POA: Diagnosis not present

## 2023-08-23 DIAGNOSIS — I10 Essential (primary) hypertension: Secondary | ICD-10-CM | POA: Diagnosis not present

## 2023-08-23 DIAGNOSIS — Z1389 Encounter for screening for other disorder: Secondary | ICD-10-CM | POA: Diagnosis not present

## 2023-08-23 DIAGNOSIS — M810 Age-related osteoporosis without current pathological fracture: Secondary | ICD-10-CM | POA: Diagnosis not present

## 2023-08-23 DIAGNOSIS — E039 Hypothyroidism, unspecified: Secondary | ICD-10-CM | POA: Diagnosis not present

## 2023-08-30 DIAGNOSIS — Z23 Encounter for immunization: Secondary | ICD-10-CM | POA: Diagnosis not present

## 2023-08-30 DIAGNOSIS — I129 Hypertensive chronic kidney disease with stage 1 through stage 4 chronic kidney disease, or unspecified chronic kidney disease: Secondary | ICD-10-CM | POA: Diagnosis not present

## 2023-08-30 DIAGNOSIS — Z1331 Encounter for screening for depression: Secondary | ICD-10-CM | POA: Diagnosis not present

## 2023-08-30 DIAGNOSIS — Z0000 Encounter for general adult medical examination without abnormal findings: Secondary | ICD-10-CM | POA: Diagnosis not present

## 2023-08-30 DIAGNOSIS — Z1339 Encounter for screening examination for other mental health and behavioral disorders: Secondary | ICD-10-CM | POA: Diagnosis not present

## 2023-08-30 DIAGNOSIS — I700 Atherosclerosis of aorta: Secondary | ICD-10-CM | POA: Diagnosis not present

## 2023-08-30 DIAGNOSIS — G894 Chronic pain syndrome: Secondary | ICD-10-CM | POA: Diagnosis not present

## 2023-08-30 DIAGNOSIS — Z86711 Personal history of pulmonary embolism: Secondary | ICD-10-CM | POA: Diagnosis not present

## 2023-08-30 DIAGNOSIS — F419 Anxiety disorder, unspecified: Secondary | ICD-10-CM | POA: Diagnosis not present

## 2023-08-30 DIAGNOSIS — Z7901 Long term (current) use of anticoagulants: Secondary | ICD-10-CM | POA: Diagnosis not present

## 2023-08-30 DIAGNOSIS — J3089 Other allergic rhinitis: Secondary | ICD-10-CM | POA: Diagnosis not present

## 2023-08-30 DIAGNOSIS — E781 Pure hyperglyceridemia: Secondary | ICD-10-CM | POA: Diagnosis not present

## 2023-08-30 DIAGNOSIS — E039 Hypothyroidism, unspecified: Secondary | ICD-10-CM | POA: Diagnosis not present

## 2023-08-30 DIAGNOSIS — N1832 Chronic kidney disease, stage 3b: Secondary | ICD-10-CM | POA: Diagnosis not present

## 2023-08-30 DIAGNOSIS — G609 Hereditary and idiopathic neuropathy, unspecified: Secondary | ICD-10-CM | POA: Diagnosis not present

## 2023-08-30 DIAGNOSIS — M1990 Unspecified osteoarthritis, unspecified site: Secondary | ICD-10-CM | POA: Diagnosis not present

## 2023-08-30 DIAGNOSIS — F1120 Opioid dependence, uncomplicated: Secondary | ICD-10-CM | POA: Diagnosis not present

## 2023-09-09 DIAGNOSIS — K31A0 Gastric intestinal metaplasia, unspecified: Secondary | ICD-10-CM | POA: Diagnosis not present

## 2023-09-09 DIAGNOSIS — Z7901 Long term (current) use of anticoagulants: Secondary | ICD-10-CM | POA: Diagnosis not present

## 2023-09-09 DIAGNOSIS — D649 Anemia, unspecified: Secondary | ICD-10-CM | POA: Diagnosis not present

## 2023-09-23 DIAGNOSIS — Z96642 Presence of left artificial hip joint: Secondary | ICD-10-CM | POA: Diagnosis not present

## 2023-09-23 DIAGNOSIS — M25551 Pain in right hip: Secondary | ICD-10-CM | POA: Diagnosis not present

## 2023-09-23 DIAGNOSIS — M25561 Pain in right knee: Secondary | ICD-10-CM | POA: Diagnosis not present

## 2023-10-17 DIAGNOSIS — M461 Sacroiliitis, not elsewhere classified: Secondary | ICD-10-CM | POA: Diagnosis not present

## 2023-10-17 DIAGNOSIS — Z6841 Body Mass Index (BMI) 40.0 and over, adult: Secondary | ICD-10-CM | POA: Diagnosis not present

## 2023-10-26 DIAGNOSIS — H31012 Macula scars of posterior pole (postinflammatory) (post-traumatic), left eye: Secondary | ICD-10-CM | POA: Diagnosis not present

## 2023-10-28 DIAGNOSIS — M25561 Pain in right knee: Secondary | ICD-10-CM | POA: Diagnosis not present

## 2023-10-28 DIAGNOSIS — Z96651 Presence of right artificial knee joint: Secondary | ICD-10-CM | POA: Diagnosis not present

## 2023-10-28 DIAGNOSIS — M25551 Pain in right hip: Secondary | ICD-10-CM | POA: Diagnosis not present

## 2023-12-16 DIAGNOSIS — M461 Sacroiliitis, not elsewhere classified: Secondary | ICD-10-CM | POA: Diagnosis not present

## 2024-02-15 DIAGNOSIS — M461 Sacroiliitis, not elsewhere classified: Secondary | ICD-10-CM | POA: Diagnosis not present

## 2024-02-15 DIAGNOSIS — Z6841 Body Mass Index (BMI) 40.0 and over, adult: Secondary | ICD-10-CM | POA: Diagnosis not present

## 2024-03-06 DIAGNOSIS — G894 Chronic pain syndrome: Secondary | ICD-10-CM | POA: Diagnosis not present

## 2024-03-06 DIAGNOSIS — M1990 Unspecified osteoarthritis, unspecified site: Secondary | ICD-10-CM | POA: Diagnosis not present

## 2024-03-06 DIAGNOSIS — E039 Hypothyroidism, unspecified: Secondary | ICD-10-CM | POA: Diagnosis not present

## 2024-03-06 DIAGNOSIS — Z7901 Long term (current) use of anticoagulants: Secondary | ICD-10-CM | POA: Diagnosis not present

## 2024-03-06 DIAGNOSIS — D649 Anemia, unspecified: Secondary | ICD-10-CM | POA: Diagnosis not present

## 2024-03-06 DIAGNOSIS — I1310 Hypertensive heart and chronic kidney disease without heart failure, with stage 1 through stage 4 chronic kidney disease, or unspecified chronic kidney disease: Secondary | ICD-10-CM | POA: Diagnosis not present

## 2024-03-06 DIAGNOSIS — I129 Hypertensive chronic kidney disease with stage 1 through stage 4 chronic kidney disease, or unspecified chronic kidney disease: Secondary | ICD-10-CM | POA: Diagnosis not present

## 2024-03-06 DIAGNOSIS — E042 Nontoxic multinodular goiter: Secondary | ICD-10-CM | POA: Diagnosis not present

## 2024-03-06 DIAGNOSIS — N1832 Chronic kidney disease, stage 3b: Secondary | ICD-10-CM | POA: Diagnosis not present

## 2024-03-06 DIAGNOSIS — I2510 Atherosclerotic heart disease of native coronary artery without angina pectoris: Secondary | ICD-10-CM | POA: Diagnosis not present

## 2024-03-06 DIAGNOSIS — Z86711 Personal history of pulmonary embolism: Secondary | ICD-10-CM | POA: Diagnosis not present

## 2024-03-06 DIAGNOSIS — F1320 Sedative, hypnotic or anxiolytic dependence, uncomplicated: Secondary | ICD-10-CM | POA: Diagnosis not present

## 2024-03-06 DIAGNOSIS — F1120 Opioid dependence, uncomplicated: Secondary | ICD-10-CM | POA: Diagnosis not present

## 2024-03-15 DIAGNOSIS — M461 Sacroiliitis, not elsewhere classified: Secondary | ICD-10-CM | POA: Diagnosis not present

## 2024-06-19 DIAGNOSIS — M461 Sacroiliitis, not elsewhere classified: Secondary | ICD-10-CM | POA: Diagnosis not present

## 2024-07-02 ENCOUNTER — Ambulatory Visit: Attending: Cardiology | Admitting: Cardiology

## 2024-07-02 ENCOUNTER — Encounter: Payer: Self-pay | Admitting: Cardiology

## 2024-07-02 VITALS — BP 139/73 | HR 52 | Ht 64.0 in | Wt 174.0 lb

## 2024-07-02 DIAGNOSIS — I10 Essential (primary) hypertension: Secondary | ICD-10-CM | POA: Diagnosis not present

## 2024-07-02 DIAGNOSIS — I350 Nonrheumatic aortic (valve) stenosis: Secondary | ICD-10-CM | POA: Insufficient documentation

## 2024-07-02 DIAGNOSIS — R600 Localized edema: Secondary | ICD-10-CM | POA: Insufficient documentation

## 2024-07-02 MED ORDER — FUROSEMIDE 20 MG PO TABS: ORAL_TABLET | ORAL | Status: AC

## 2024-07-02 NOTE — Patient Instructions (Addendum)
 Lab Work: PROBNP  If you have labs (blood work) drawn today and your tests are completely normal, you will receive your results only by: MyChart Message (if you have MyChart) OR A paper copy in the mail If you have any lab test that is abnormal or we need to change your treatment, we will call you to review the results.  Testing/Procedures: ECHOCARDIOGRAM   Your physician has requested that you have an echocardiogram. Echocardiography is a painless test that uses sound waves to create images of your heart. It provides your doctor with information about the size and shape of your heart and how well your heart's chambers and valves are working. This procedure takes approximately one hour. There are no restrictions for this procedure. Please do NOT wear cologne, perfume, aftershave, or lotions (deodorant is allowed). Please arrive 15 minutes prior to your appointment time.  Please note: We ask at that you not bring children with you during ultrasound (echo/ vascular) testing. Due to room size and safety concerns, children are not allowed in the ultrasound rooms during exams. Our front office staff cannot provide observation of children in our lobby area while testing is being conducted. An adult accompanying a patient to their appointment will only be allowed in the ultrasound room at the discretion of the ultrasound technician under special circumstances. We apologize for any inconvenience.   Follow-Up: At [facility], you and your health needs are our priority.  As part of our continuing mission to provide you with exceptional heart care, our providers are all part of one team.  This team includes your primary Cardiologist (physician) and Advanced Practice Providers or APPs (Physician Assistants and Nurse Practitioners) who all work together to provide you with the care you need, when you need it.  Your next appointment:   4 week(s) (AFTER ECHO)  Provider:   APP

## 2024-07-02 NOTE — Progress Notes (Signed)
 Cardiology Office Note:  .   Date:  07/02/2024  ID:  [name], DOB Jul 26, 1946, MRN [id number] PCP: Onita Rush, MD  [facility] Providers Cardiologist:  Newman Lawrence, MD PCP: Onita Rush, MD  Chief Complaint  Patient presents with   laterality/Bilateral swelling of feet   Joint Swelling     [name] is a 78 y.o. female with hypertension, nonobstructive CAD (CTA 2019) aortic stenosis (mild in 2023), CKD stage 3a, h/o PE  Discussed the use of AI scribe software for clinical note transcription with the patient, who gave verbal consent to proceed.  History of Present Illness [name] is a 78 year old female with pulmonary embolism and aortic stenosis who presents with worsening shortness of breath and ankle swelling. She was referred by Lauraine Schultze, PA, for evaluation of her swollen feet and potential cardiac issues.  She experiences worsening shortness of breath, particularly with exertion, and lightheadedness when bending over. Her pulse has been in the 40s, indicating bradycardia. Ankle swelling has progressed over several years, with feet appearing swollen and shiny, and ankles sometimes becoming severely swollen. She is on Xarelto  20 mg daily for anticoagulation due to pulmonary embolism and has aortic stenosis identified two years ago with mild narrowing of the aortic valve. Current medications include atenolol  25 mg daily, furosemide  40 mg twice a day with an additional dose if swelling worsens, losartan  50 mg twice a day, and simvastatin  20 mg for cholesterol management. No history of asthma, chest pain, chest tightness, or chest pressure.      There were no vitals filed for this visit.    Review of Systems  Cardiovascular:  Positive for dyspnea on exertion and leg swelling. Negative for chest pain, palpitations and syncope.        Studies Reviewed: [name]        EKG 07/02/2024: Sinus bradycardia 52 bpm Moderate voltage criteria for LVH,  may be normal variant ( R in aVL , Cornell product ) When compared with ECG of 01-Oct-2021 14:20, No significant change was found     Echocardiogram 02/2022:  1. Left ventricular ejection fraction, by estimation, is 60 to 65%. Left  ventricular ejection fraction by 3D volume is 65 %. The left ventricle has  normal function. The left ventricle has no regional wall motion  abnormalities. Left ventricular diastolic   parameters were normal. The average left ventricular global longitudinal  strain is -24.0 %. The global longitudinal strain is normal.   2. Right ventricular systolic function is normal. The right ventricular  size is normal.   3. The mitral valve is normal in structure. Trivial mitral valve  regurgitation. No evidence of mitral stenosis.   4. The aortic valve is calcified. Aortic valve regurgitation is not  visualized. Mild aortic valve stenosis. Aortic valve area, by VTI measures  2.48 cm. Aortic valve mean gradient measures 13.0 mmHg. Aortic valve Vmax measures 2.42 m/s.   Comparison(s): 01/12/18 EF 55-60%. Mild AS mean, peak PG.    Labs 03/2024: Hb 11.2 Cr 1.3, eGFR 39    Physical Exam Vitals and nursing note reviewed.  Constitutional:      General: She is not in acute distress. Neck:     Vascular: No JVD.  Cardiovascular:     Rate and Rhythm: Normal rate and regular rhythm.     Heart sounds: Normal heart sounds. No murmur heard. Pulmonary:     Effort: Pulmonary effort is normal.  Breath sounds: Normal breath sounds. No wheezing or rales.  Musculoskeletal:     Right lower leg: Edema (Trace edema. Large leg circumference, but minimal evidence of fluid retention) present.     Left lower leg: Edema (Trace edema. Large leg circumference, but minimal evidence of fluid retention) present.      VISIT DIAGNOSES:   ICD-10-CM   1. Nonrheumatic aortic valve stenosis  I35.0 EKG 12-Lead    Pro b natriuretic peptide (BNP)    ECHOCARDIOGRAM COMPLETE     2. Leg edema  R60.0 EKG 12-Lead    Pro b natriuretic peptide (BNP)    ECHOCARDIOGRAM COMPLETE       [name] is a 78 y.o. female with hypertension, nonobstructive CAD (CTA 2019) aortic stenosis (mild in 2023), CKD stage 3a, h/o PE  Assessment & Plan Aortic valve stenosis: Mild aortic stenosis with worsening symptoms, suggesting increased severity. Severe aortic stenosis considered due to symptoms. - Order echocardiogram to assess severity. - Check ProBNP levels for fluid retention evaluation.  Chronic lower extremity edema: Chronic leg and ankle swelling, possibly lymphedema. Edema may relate to cardiac issues, including aortic stenosis. - Continue furosemide  40 mg twice daily. - Advise on dietary salt reduction.  Hypertension: Managed with atenolol , losartan , and furosemide . Current regimen includes atenolol  25 mg daily and losartan  50 mg twice daily. - Continue atenolol  25 mg daily. - Continue losartan  50 mg twice daily.  Bradycardia: Likely due to atenolol .    Meds ordered this encounter  Medications   furosemide  (LASIX ) 20 MG tablet    Sig: Take 2 tablets (40 mg total) by mouth in the morning. May also take 1 tablet (20 mg total) as needed.     F/u recommendations after echocardiogram  Signed, Newman JINNY Lawrence, MD

## 2024-07-03 ENCOUNTER — Ambulatory Visit: Payer: Self-pay | Admitting: Cardiology

## 2024-07-03 LAB — PRO B NATRIURETIC PEPTIDE: NT-Pro BNP: 331 pg/mL (ref 0–738)

## 2024-07-25 ENCOUNTER — Ambulatory Visit (HOSPITAL_BASED_OUTPATIENT_CLINIC_OR_DEPARTMENT_OTHER)
Admission: RE | Admit: 2024-07-25 | Discharge: 2024-07-25 | Disposition: A | Discharge status: Home / Self Care | Source: Ambulatory Visit | Attending: Cardiology | Admitting: Cardiology

## 2024-07-25 DIAGNOSIS — R600 Localized edema: Secondary | ICD-10-CM | POA: Diagnosis not present

## 2024-07-25 DIAGNOSIS — I350 Nonrheumatic aortic (valve) stenosis: Secondary | ICD-10-CM | POA: Insufficient documentation

## 2024-07-25 LAB — ECHOCARDIOGRAM COMPLETE
AR max vel: 1.38 cm2
AV Area VTI: 1.4 cm2
AV Area mean vel: 1.33 cm2
AV Mean grad: 14.3 mmHg
AV Peak grad: 25.2 mmHg
Ao pk vel: 2.51 m/s
Area-P 1/2: 2.52 cm2
Calc EF: 61.4 %
MV M vel: 1.65 m/s
MV Peak grad: 10.9 mmHg
S' Lateral: 2.7 cm
Single Plane A2C EF: 64.4 %
Single Plane A4C EF: 60.6 %

## 2024-07-30 NOTE — Progress Notes (Signed)
 Mild AS, otherwise unremarkable. Can repeat echocardiogram in 2 years. Seeing you om 11/4.  Thanks MJP

## 2024-07-30 NOTE — Progress Notes (Signed)
 Correction: Meant to send it to [facility], PA-C.  Thanks MJP

## 2024-08-10 ENCOUNTER — Other Ambulatory Visit (HOSPITAL_COMMUNITY)

## 2024-08-17 ENCOUNTER — Encounter: Payer: Self-pay | Admitting: *Deleted

## 2024-08-20 NOTE — Progress Notes (Signed)
 OFFICE NOTE:    Date:  08/21/2024  ID:  Winton LITTIE Freud, DOB 03-16-1946, MRN [id number] PCP: Onita Rush, MD  [facility] Providers Cardiologist:  Newman JINNY Lawrence, MD        Aortic stenosis TTE 07/25/24: EF 60-65, no RWMA, NL RVSF, mild MR, mild AS (mean 14.3, Vmax 251 cm/s, DI 0.62) Coronary artery disease  MPI 08/05/16: EF 63, low risk  CCTA 08/19/18: CAC score 135 (72nd percentile); mild nonobs CAD pLAD and dRCA [dRCA 25-49, pLAD 25-49]; FFR dist PL 0.76, o/w no significant stenosis >> Med Rx  Recurrent DVT, recurrent pulmonary embolism  S/p IVC filter  Hypertension  Chronic kidney disease  Hyperlipidemia Hypothyroidism         Discussed the use of AI scribe software for clinical note transcription with the patient, who gave verbal consent to proceed. History of Present Illness [name] is a 78 y.o. female for follow up of CAD. She is a prior pt of Dr. Claudene. She saw Dr. Lawrence 07/02/24 for shortness of breath and edema. Follow up monitor showed normal EF, mild AS. BNP was normal.   She has a history of deep vein thrombosis and pulmonary embolism, managed with an inferior vena cava filter and long-term anticoagulation with Xarelto . The pulmonary embolism occurred after a prolonged surgery, with no other blood clots since then. Chronic leg swelling has been present for years, fluctuating with activity and prolonged standing. Her ankles have been 'big for years.' She has not been evaluated for lymphedema or venous insufficiency previously. She experiences shortness of breath, especially with exertion, such as climbing into a car or walking quickly. No chest pain or syncope is reported. She sleeps in a chair due to discomfort lying flat and has not been tested for sleep apnea. Her husband notes she wheezes, particularly after exertion. Her medical history includes multiple orthopedic surgeries, including knee replacements and back surgeries, impacting her mobility.  She fears falling due to past falls resulting in fractures. Her current activity level is limited, primarily moving around the house.     ROS-See HPI    Studies Reviewed:       Labs (from PCP) 08/24/2023: Hemoglobin 11.6, PLT 277K, total cholesterol 189, triglycerides 232, HDL 51, LDL 99, creatinine 1.35, K 4.6, ALT 11 03/08/2024: TSH 1.74         Physical Exam:  VS:  BP 126/68   Pulse (!) 54   Ht 5' 3 (1.6 m)   Wt 281 lb 9.6 oz (127.7 kg)   LMP  (LMP Unknown)   SpO2 96%   BMI 49.88 kg/m        Wt Readings from Last 3 Encounters:  08/21/24 281 lb 9.6 oz (127.7 kg)  07/02/24 174 lb (78.9 kg)  06/09/23 288 lb (130.6 kg)    Constitutional:      Appearance: Healthy appearance. Not in distress.  Pulmonary:     Breath sounds: Normal breath sounds. No wheezing. No rales.  Cardiovascular:     Normal rate. Regular rhythm.     Murmurs: There is a grade 3/6 crescendo-decrescendo systolic murmur at the URSB.  Edema:    Peripheral edema (nonpitting) present.    Thigh: bilateral 2+ edema of the thigh.    Pretibial: bilateral 2+ edema of the pretibial area.    Ankle: bilateral 2+ edema of the ankle.    Feet: bilateral 2+ edema of the feet.      Assessment and Plan:  Assessment & Plan Leg edema Chronic non-pitting edema of the lower extremities. She does have a hx of DVT, PE. The differential includes venous insufficiency and lymphedema. She does not have significant improvement when she takes extra Lasix . She is limited in her mobility from prior back and knee surgeries. This is likely impacting her edema as well as obesity. She would like to start a GLP1a to help with weight loss. She plans to discuss this further with her PCP. Since she does have a hx of DVT and PE, I think it is reasonable to see vascular surgery for venous evaluation. I am not certain if there is anything they can offer her. But it is worth at least getting vascular surgery's opinion. If there is nothing  vascular surgery can offer her, she would likely benefit from seeing the lymphedema clinic.  - Refer to vascular surgery for venous insufficiency workup. - Refer to lymphedema clinic for evaluation and management. Nonrheumatic aortic valve stenosis Mild aortic stenosis by echocardiogram October 2025 with mean gradient 14.3.  EF normal.  Plan is to repeat echo in 2 years.  Coronary artery disease involving native coronary artery of native heart without angina pectoris History of mild nonobstructive CAD by coronary CTA in 2019. She is not having chest pain to suggest angina. Continue Simvastatin  20 mg once daily.  Essential hypertension BP controlled. Continue Atenolol  25 mg once daily, Losartan  50 mg twice daily. History of pulmonary embolism History of IVC filter.  She is on chronic anticoagulation.This is managed by primary care.  Morbid obesity (HCC) As noted, this is also contributing to her leg edema. She plans to discuss GLP1 agonists with her PCP. Her edema will improve with weight loss.         Dispo:  Return in about 1 year (around 08/21/2025) for Routine Follow Up, w/ Dr. [place].  Signed, Glendia Ferrier, PA-C

## 2024-08-21 ENCOUNTER — Ambulatory Visit: Attending: Physician Assistant | Admitting: Physician Assistant

## 2024-08-21 ENCOUNTER — Encounter: Payer: Self-pay | Admitting: Physician Assistant

## 2024-08-21 VITALS — BP 126/68 | HR 54 | Ht 63.0 in | Wt 281.6 lb

## 2024-08-21 DIAGNOSIS — E7800 Pure hypercholesterolemia, unspecified: Secondary | ICD-10-CM

## 2024-08-21 DIAGNOSIS — I350 Nonrheumatic aortic (valve) stenosis: Secondary | ICD-10-CM | POA: Diagnosis not present

## 2024-08-21 DIAGNOSIS — R600 Localized edema: Secondary | ICD-10-CM | POA: Diagnosis not present

## 2024-08-21 DIAGNOSIS — I10 Essential (primary) hypertension: Secondary | ICD-10-CM

## 2024-08-21 DIAGNOSIS — Z86711 Personal history of pulmonary embolism: Secondary | ICD-10-CM | POA: Diagnosis not present

## 2024-08-21 DIAGNOSIS — I2510 Atherosclerotic heart disease of native coronary artery without angina pectoris: Secondary | ICD-10-CM

## 2024-08-21 DIAGNOSIS — I2699 Other pulmonary embolism without acute cor pulmonale: Secondary | ICD-10-CM

## 2024-08-21 NOTE — Assessment & Plan Note (Deleted)
 LDL in November 2024 above goal.  Goal is at least less than 70.***

## 2024-08-21 NOTE — Assessment & Plan Note (Signed)
 BP controlled. Continue Atenolol  25 mg once daily, Losartan  50 mg twice daily.

## 2024-08-21 NOTE — Assessment & Plan Note (Signed)
 Chronic non-pitting edema of the lower extremities. She does have a hx of DVT, PE. The differential includes venous insufficiency and lymphedema. She does not have significant improvement when she takes extra Lasix . She is limited in her mobility from prior back and knee surgeries. This is likely impacting her edema as well as obesity. She would like to start a GLP1a to help with weight loss. She plans to discuss this further with her PCP. Since she does have a hx of DVT and PE, I think it is reasonable to see vascular surgery for venous evaluation. I am not certain if there is anything they can offer her. But it is worth at least getting vascular surgery's opinion. If there is nothing vascular surgery can offer her, she would likely benefit from seeing the lymphedema clinic.  - Refer to vascular surgery for venous insufficiency workup. - Refer to lymphedema clinic for evaluation and management.

## 2024-08-21 NOTE — Assessment & Plan Note (Addendum)
 Mild aortic stenosis by echocardiogram October 2025 with mean gradient 14.3.  EF normal.  Plan is to repeat echo in 2 years.

## 2024-08-21 NOTE — Assessment & Plan Note (Addendum)
 History of mild nonobstructive CAD by coronary CTA in 2019. She is not having chest pain to suggest angina. Continue Simvastatin  20 mg once daily.

## 2024-08-21 NOTE — Patient Instructions (Addendum)
 Medication Instructions:  Your physician recommends that you continue on your current medications as directed. Please refer to the Current Medication list given to you today.  *If you need a refill on your cardiac medications before your next appointment, please call your pharmacy*  Lab Work: None ordered If you have labs (blood work) drawn today and your tests are completely normal, you will receive your results only by: MyChart Message (if you have MyChart) OR A paper copy in the mail If you have any lab test that is abnormal or we need to change your treatment, we will call you to review the results.  Testing/Procedures: None ordered  Follow-Up: At [facility], you and your health needs are our priority.  As part of our continuing mission to provide you with exceptional heart care, our providers are all part of one team.  This team includes your primary Cardiologist (physician) and Advanced Practice Providers or APPs (Physician Assistants and Nurse Practitioners) who all work together to provide you with the care you need, when you need it.  Your next appointment:   1 year(s)  Provider:   Newman JINNY Lawrence, MD    We recommend signing up for the patient portal called MyChart.  Sign up information is provided on this After Visit Summary.  MyChart is used to connect with patients for Virtual Visits (Telemedicine).  Patients are able to view lab/test results, encounter notes, upcoming appointments, etc.  Non-urgent messages can be sent to your provider as well.   To learn more about what you can do with MyChart, go to [email or web address].   Other Instructions  Your provider has referred you to Vascular Surgery for your edema. They will reach out to you to schedule a appointment.

## 2024-08-21 NOTE — Assessment & Plan Note (Addendum)
 History of IVC filter.  She is on chronic anticoagulation.This is managed by primary care.

## 2024-08-28 NOTE — Addendum Note (Signed)
 Addended byBETHA FERRIER, GLENDIA T on: 08/28/2024 02:35 PM   Modules accepted: Orders

## 2024-08-31 ENCOUNTER — Other Ambulatory Visit: Payer: Self-pay | Admitting: *Deleted

## 2024-08-31 ENCOUNTER — Ambulatory Visit (HOSPITAL_COMMUNITY)
Admission: RE | Admit: 2024-08-31 | Discharge: 2024-08-31 | Disposition: A | Discharge status: Home / Self Care | Source: Ambulatory Visit | Attending: Surgery | Admitting: Surgery

## 2024-08-31 DIAGNOSIS — R600 Localized edema: Secondary | ICD-10-CM | POA: Insufficient documentation

## 2024-09-03 DIAGNOSIS — D649 Anemia, unspecified: Secondary | ICD-10-CM | POA: Diagnosis not present

## 2024-09-10 DIAGNOSIS — Z23 Encounter for immunization: Secondary | ICD-10-CM | POA: Diagnosis not present

## 2024-09-10 DIAGNOSIS — R82998 Other abnormal findings in urine: Secondary | ICD-10-CM | POA: Diagnosis not present

## 2024-09-17 ENCOUNTER — Other Ambulatory Visit (HOSPITAL_COMMUNITY): Payer: Self-pay | Admitting: Internal Medicine

## 2024-09-17 ENCOUNTER — Telehealth (HOSPITAL_COMMUNITY): Payer: Self-pay

## 2024-09-17 DIAGNOSIS — D649 Anemia, unspecified: Secondary | ICD-10-CM | POA: Insufficient documentation

## 2024-09-17 NOTE — Telephone Encounter (Signed)
 Auth Submission: NO AUTH NEEDED Site of care: Site of care: [facility] Payer: [employer] Medication & CPT/J Code(s) submitted: Feraheme (ferumoxytol ) [id number] Diagnosis Code: D64.9 Route of submission (phone, fax, portal):  Phone # Fax # Auth type: Buy/Bill HB Units/visits requested: 510mg  x 1 dose Reference number:  Approval from: 09/17/24 to 10/17/24

## 2024-09-20 DIAGNOSIS — M461 Sacroiliitis, not elsewhere classified: Secondary | ICD-10-CM | POA: Diagnosis not present

## 2024-09-21 ENCOUNTER — Other Ambulatory Visit: Payer: Self-pay | Admitting: Obstetrics and Gynecology

## 2024-09-21 DIAGNOSIS — Z1231 Encounter for screening mammogram for malignant neoplasm of breast: Secondary | ICD-10-CM

## 2024-09-28 ENCOUNTER — Inpatient Hospital Stay (HOSPITAL_COMMUNITY): Admission: RE | Admit: 2024-09-28 | Discharge: 2024-09-28 | Attending: Internal Medicine

## 2024-09-28 ENCOUNTER — Inpatient Hospital Stay
Admission: RE | Admit: 2024-09-28 | Discharge: 2024-09-28 | Attending: Obstetrics and Gynecology | Admitting: Obstetrics and Gynecology

## 2024-09-28 VITALS — BP 122/41 | HR 60 | Temp 97.5°F | Resp 16

## 2024-09-28 DIAGNOSIS — D649 Anemia, unspecified: Secondary | ICD-10-CM | POA: Diagnosis present

## 2024-09-28 DIAGNOSIS — Z1231 Encounter for screening mammogram for malignant neoplasm of breast: Secondary | ICD-10-CM

## 2024-09-28 MED ORDER — SODIUM CHLORIDE 0.9 % IV SOLN
510.0000 mg | Freq: Once | INTRAVENOUS | Status: AC
  Administered 2024-09-28: 510 mg via INTRAVENOUS
  Filled 2024-09-28: qty 17

## 2024-09-28 NOTE — Progress Notes (Signed)
 Diagnosis:Anemia    Provider:John Russo    Procedure:Feraheme 510 mg     Note: Patient came for for Feraheme 510 mg infusion via PIV.  Patient tolerated infusion without incident.  Patient alert, oriented , and ambulatory with walker upon discharge.  She doesn't have another infusion scheduled, this was 1 of 1 order.

## 2024-10-31 ENCOUNTER — Emergency Department (HOSPITAL_COMMUNITY)

## 2024-10-31 ENCOUNTER — Other Ambulatory Visit: Payer: Self-pay

## 2024-10-31 ENCOUNTER — Inpatient Hospital Stay (HOSPITAL_COMMUNITY)
Admission: EM | Admit: 2024-10-31 | Discharge: 2024-11-20 | DRG: 551 | Disposition: A | Discharge status: Skilled Nursing Facility | Attending: Internal Medicine | Admitting: Internal Medicine

## 2024-10-31 DIAGNOSIS — N183 Chronic kidney disease, stage 3 unspecified: Secondary | ICD-10-CM | POA: Diagnosis present

## 2024-10-31 DIAGNOSIS — Z7901 Long term (current) use of anticoagulants: Secondary | ICD-10-CM

## 2024-10-31 DIAGNOSIS — R338 Other retention of urine: Principal | ICD-10-CM

## 2024-10-31 DIAGNOSIS — R52 Pain, unspecified: Secondary | ICD-10-CM

## 2024-10-31 DIAGNOSIS — S32009A Unspecified fracture of unspecified lumbar vertebra, initial encounter for closed fracture: Secondary | ICD-10-CM | POA: Diagnosis present

## 2024-10-31 DIAGNOSIS — S32058A Other fracture of fifth lumbar vertebra, initial encounter for closed fracture: Principal | ICD-10-CM

## 2024-10-31 DIAGNOSIS — S32059A Unspecified fracture of fifth lumbar vertebra, initial encounter for closed fracture: Secondary | ICD-10-CM | POA: Diagnosis not present

## 2024-10-31 DIAGNOSIS — D62 Acute posthemorrhagic anemia: Secondary | ICD-10-CM

## 2024-10-31 DIAGNOSIS — I350 Nonrheumatic aortic (valve) stenosis: Secondary | ICD-10-CM

## 2024-10-31 DIAGNOSIS — N179 Acute kidney failure, unspecified: Secondary | ICD-10-CM | POA: Diagnosis not present

## 2024-10-31 DIAGNOSIS — R600 Localized edema: Secondary | ICD-10-CM | POA: Diagnosis present

## 2024-10-31 DIAGNOSIS — I2699 Other pulmonary embolism without acute cor pulmonale: Secondary | ICD-10-CM | POA: Diagnosis present

## 2024-10-31 DIAGNOSIS — Z419 Encounter for procedure for purposes other than remedying health state, unspecified: Secondary | ICD-10-CM

## 2024-10-31 DIAGNOSIS — N1831 Chronic kidney disease, stage 3a: Secondary | ICD-10-CM | POA: Diagnosis not present

## 2024-10-31 LAB — CBC
HCT: 35.6 % — ABNORMAL LOW (ref 36.0–46.0)
Hemoglobin: 11.4 g/dL — ABNORMAL LOW (ref 12.0–15.0)
MCH: 28.7 pg (ref 26.0–34.0)
MCHC: 32 g/dL (ref 30.0–36.0)
MCV: 89.7 fL (ref 80.0–100.0)
Platelets: 270 K/uL (ref 150–400)
RBC: 3.97 MIL/uL (ref 3.87–5.11)
RDW: 15.9 % — ABNORMAL HIGH (ref 11.5–15.5)
WBC: 10.7 K/uL — ABNORMAL HIGH (ref 4.0–10.5)
nRBC: 0 % (ref 0.0–0.2)

## 2024-10-31 LAB — BASIC METABOLIC PANEL WITH GFR
Anion gap: 14 (ref 5–15)
BUN: 35 mg/dL — ABNORMAL HIGH (ref 8–23)
CO2: 27 mmol/L (ref 22–32)
Calcium: 10.3 mg/dL (ref 8.9–10.3)
Chloride: 92 mmol/L — ABNORMAL LOW (ref 98–111)
Creatinine, Ser: 1.72 mg/dL — ABNORMAL HIGH (ref 0.44–1.00)
GFR, Estimated: 30 mL/min — ABNORMAL LOW
Glucose, Bld: 115 mg/dL — ABNORMAL HIGH (ref 70–99)
Potassium: 4.9 mmol/L (ref 3.5–5.1)
Sodium: 133 mmol/L — ABNORMAL LOW (ref 135–145)

## 2024-10-31 MED ORDER — ACETAMINOPHEN 650 MG RE SUPP: 650.0000 mg | Freq: Four times a day (QID) | RECTAL | Status: DC | PRN

## 2024-10-31 MED ORDER — MELATONIN 3 MG PO TABS
3.0000 mg | ORAL_TABLET | Freq: Every evening | ORAL | Status: DC | PRN
  Administered 2024-11-08 – 2024-11-19 (×8): 3 mg via ORAL
  Filled 2024-10-31 (×10): qty 1

## 2024-10-31 MED ORDER — MORPHINE SULFATE (PF) 4 MG/ML IV SOLN
4.0000 mg | Freq: Once | INTRAVENOUS | Status: AC
  Administered 2024-10-31: 4 mg via INTRAVENOUS
  Filled 2024-10-31: qty 1

## 2024-10-31 MED ORDER — SODIUM CHLORIDE 0.9 % IV BOLUS
1000.0000 mL | Freq: Once | INTRAVENOUS | Status: AC
  Administered 2024-11-01: 1000 mL via INTRAVENOUS

## 2024-10-31 MED ORDER — ONDANSETRON HCL 4 MG/2ML IJ SOLN
4.0000 mg | Freq: Four times a day (QID) | INTRAMUSCULAR | Status: DC | PRN
  Administered 2024-11-15: 4 mg via INTRAVENOUS
  Filled 2024-10-31: qty 2

## 2024-10-31 MED ORDER — MORPHINE SULFATE (PF) 4 MG/ML IV SOLN
4.0000 mg | Freq: Once | INTRAVENOUS | Status: AC
  Administered 2024-10-31: 4 mg via INTRAMUSCULAR

## 2024-10-31 MED ORDER — LIDOCAINE 5 % EX PTCH
1.0000 | MEDICATED_PATCH | Freq: Once | CUTANEOUS | Status: AC
  Administered 2024-10-31: 1 via TRANSDERMAL
  Filled 2024-10-31: qty 1

## 2024-10-31 MED ORDER — SODIUM CHLORIDE 0.9% FLUSH
3.0000 mL | Freq: Two times a day (BID) | INTRAVENOUS | Status: DC
  Administered 2024-11-01 – 2024-11-02 (×2): 3 mL via INTRAVENOUS

## 2024-10-31 MED ORDER — MORPHINE SULFATE (PF) 4 MG/ML IV SOLN
4.0000 mg | Freq: Once | INTRAVENOUS | Status: DC
  Filled 2024-10-31: qty 1

## 2024-10-31 MED ORDER — DEXAMETHASONE SOD PHOSPHATE PF 10 MG/ML IJ SOLN
10.0000 mg | Freq: Once | INTRAMUSCULAR | Status: AC
  Administered 2024-10-31: 10 mg via INTRAVENOUS
  Filled 2024-10-31: qty 1

## 2024-10-31 MED ORDER — BISACODYL 5 MG PO TBEC
5.0000 mg | DELAYED_RELEASE_TABLET | Freq: Every day | ORAL | Status: DC | PRN
  Administered 2024-11-04 – 2024-11-05 (×2): 5 mg via ORAL
  Filled 2024-10-31 (×2): qty 1

## 2024-10-31 MED ORDER — ACETAMINOPHEN 500 MG PO TABS
1000.0000 mg | ORAL_TABLET | Freq: Once | ORAL | Status: AC
  Administered 2024-10-31: 1000 mg via ORAL
  Filled 2024-10-31: qty 2

## 2024-10-31 MED ORDER — ONDANSETRON HCL 4 MG PO TABS
4.0000 mg | ORAL_TABLET | Freq: Four times a day (QID) | ORAL | Status: DC | PRN
  Administered 2024-11-06 – 2024-11-09 (×3): 4 mg via ORAL
  Filled 2024-10-31 (×3): qty 1

## 2024-10-31 MED ORDER — ACETAMINOPHEN 325 MG PO TABS: 650.0000 mg | ORAL_TABLET | Freq: Four times a day (QID) | ORAL | Status: DC | PRN

## 2024-10-31 NOTE — H&P (Addendum)
 " History and Physical    Patient: [name] FMW:[id number] DOB: 1945/12/14 DOA: 10/31/2024 DOS: the patient was seen and examined on 10/31/2024 PCP: Onita Rush, MD  Patient coming from: Home  Chief Complaint:  Chief Complaint  Patient presents with   Fall   HPI: [name] is a 79 y.o. female with medical history significant for history of PE and DVT on chronic anticoagulation, history of IVC filter, history of multiple back surgeries, mild aortic stenosis, nonobstructive coronary artery disease, hypertension and morbid obesity on GLP1 who presents today after a fall.  The patient actually fell in her kitchen 24 hours ago.  Several family members had to get her up off the floor after she fell because she was in significant pain.  They put her in her recliner.  The patient sleeps in her recliner.  She stayed there all night.  She was able to get herself to the bathroom with excruciating pain but otherwise she stayed in her recliner.  This afternoon the patient's daughter-in-law came over and saw how much pain she was in and called EMS. In the emergency department the patient had CT of her head, C-spine, pelvis and L-spine.  The most significant finding is an acute lumbar fracture extending through the area of her previous lumbar fusion.  Neurosurgery was called by the ED provider and they recommend admission pain control, keep the head of bed less than 30 degrees.  She very well may need repeat surgery. At the time of my evaluation the patient reports that she gets sharp spasms of pain periodically shooting through her right hip. She denies any fever or chest pain or shortness of breath.  She has been receiving 4 mg of IV morphine  periodically in the ED with good results.  Review of Systems: As mentioned in the history of present illness. All other systems reviewed and are negative. Past Medical History:  Diagnosis Date   Anemia    Arthritis    osteoarthritis   Claustrophobia     Complication of anesthesia    hard time waking up after her gallbladder surgery 28 years ago   DVT (deep venous thrombosis) (HCC)    left leg   GERD (gastroesophageal reflux disease)    hx of   Heart murmur    Dr. Claudene follows, 'nothing to worry about.'   History of blood transfusion    History of hiatal hernia    small   History of kidney stones    Hypercholesteremia    under control   Hypertension    Hypothyroidism    Mild aortic stenosis    a. by echo 12/2017.   Panic attack    PONV (postoperative nausea and vomiting)    Pulmonary embolism (HCC)    bilateral lungs - 2008 after disc surgery   Past Surgical History:  Procedure Laterality Date   ABDOMINAL HYSTERECTOMY  10/18/1973   partial   APPENDECTOMY  10/19/1971   APPLICATION OF ROBOTIC ASSISTANCE FOR SPINAL PROCEDURE N/A 10/05/2021   Procedure: APPLICATION OF ROBOTIC ASSISTANCE FOR SPINAL PROCEDURE;  Surgeon: [name], MD;  Location: [facility] OR;  Service: Neurosurgery;  Laterality: N/A;   BACK SURGERY     X14 1987-2009   BREAST BIOPSY Left 09/01/2021   CARDIAC CATHETERIZATION  ~2010   CARPAL TUNNEL RELEASE Bilateral    CHOLECYSTECTOMY  10/19/1995   COLONOSCOPY     COLONOSCOPY WITH PROPOFOL  N/A 12/05/2017   Procedure: COLONOSCOPY WITH PROPOFOL ;  Surgeon: Celestia Agent, MD;  Location:  [facility];  Service: Endoscopy;  Laterality: N/A;   DILATION AND CURETTAGE OF UTERUS     ENDARTERECTOMY Right 08/10/2013   Procedure: Excision of Venous Aneurysm Right Neck;  Surgeon: Krystal JULIANNA Doing, MD;  Location: [facility] OR;  Service: Vascular;  Laterality: Right;   ESOPHAGOGASTRODUODENOSCOPY (EGD) WITH PROPOFOL  N/A 12/05/2017   Procedure: ESOPHAGOGASTRODUODENOSCOPY (EGD) WITH PROPOFOL ;  Surgeon: Celestia Agent, MD;  Location: [facility] ENDOSCOPY;  Service: Endoscopy;  Laterality: N/A;   filter Left 10/19/2007   IVC   FRACTURE SURGERY Left    left wrist   HIP SURGERY Left 10/18/2006   JOINT REPLACEMENT Right 11/19/2003   (knee)multiple  surgeries   JOINT REPLACEMENT Left 12/16/2009   (knee)   JOINT REPLACEMENT Left 2011, 2012   Hip   KYPHOPLASTY N/A 03/03/2021   Procedure: Thoracic seven Kyphoplasty;  Surgeon: [name], MD;  Location: [facility] OR;  Service: Neurosurgery;  Laterality: N/A;   KYPHOPLASTY N/A 03/31/2021   Procedure: Thoracic seven Kyphoplasty;  Surgeon: [name], MD;  Location: [facility] OR;  Service: Neurosurgery;  Laterality: N/A;   LUMBAR LAMINECTOMY/ DECOMPRESSION WITH MET-RX Right 04/17/2021   Procedure: Right Thoracic Seven-Eight  Extraforaminal decompression/foraminotomy;  Surgeon: [name], MD;  Location: [facility] OR;  Service: Neurosurgery;  Laterality: Right;   MASS EXCISION  10/18/1972   uterus   QUADRICEPS TENDON REPAIR Right 12/06/2016   Procedure: QUAD TENDON RECONSTRUCTION;  Surgeon: Donnice Car, MD;  Location: [facility] ORS;  Service: Orthopedics;  Laterality: Right;  Requests for 90 mins   RADIOLOGY WITH ANESTHESIA N/A 01/20/2021   Procedure: MRI WITH [facility] PAIN WITHOUT CONTRAST;  Surgeon: Radiologist, Medication, MD;  Location: [facility] OR;  Service: Radiology;  Laterality: N/A;   RADIOLOGY WITH ANESTHESIA N/A 10/02/2021   Procedure: MRI WITH ANESTHESIA;  Surgeon: Radiologist, Medication, MD;  Location: [facility] OR;  Service: Radiology;  Laterality: N/A;   TONSILLECTOMY  10/18/1984   TOTAL HIP REVISION Left 12/07/2013   Procedure:  REVISION  CONSTRAINED LINER LEFT TOTAL HIP ;  Surgeon: Donnice JONETTA Car, MD;  Location: [facility] ORS;  Service: Orthopedics;  Laterality: Left;   TOTAL SHOULDER REPLACEMENT Right 11/30/2010   TUBAL LIGATION  10/19/1971   Social History:  reports that she has never smoked. She has never used smokeless tobacco. She reports that she does not drink alcohol  and does not use drugs.  Allergies[1]  Family History  Problem Relation Age of Onset   Diabetes Mother    Heart disease Mother        before age 57 - ?thick blood - pt does not know if patient had formal heart issue but heart stopped    Hyperlipidemia Mother    Hypertension Mother    Diabetes Father    Hyperlipidemia Father    Hypertension Father    Heart attack Father        in his 30s during adm for flu   Peripheral vascular disease Father    Other Father        amputation   Diabetes Sister    Hyperlipidemia Sister    Hypertension Sister    Deep vein thrombosis Brother    Hyperlipidemia Brother    Hypertension Brother     Prior to Admission medications  Medication Sig Start Date End Date Taking? Authorizing Provider  ALPRAZolam  (XANAX ) 0.5 MG tablet Take 0.5 mg by mouth 2 (two) times daily. 07/17/13  Yes [provider]  ascorbic acid  (VITAMIN C) 500 MG tablet Take 2,000 mg by mouth daily.   Yes [provider]  atenolol  (TENORMIN ) 25 MG tablet Take 25 mg by mouth See admin instructions. Take one tablet (25 mg) by mouth every morning, may also take a 2nd tablet (25 mg) if SBP >150 06/11/13  Yes [provider]  Bacillus Coagulans-Inulin (PROBIOTIC-PREBIOTIC PO) Take 1 tablet by mouth in the morning.   Yes [provider]  baclofen  (LIORESAL ) 10 MG tablet Take 1 tablet (10 mg total) by mouth every 8 (eight) hours. 12/07/13  Yes Babish, Donnice, PA-C  escitalopram  (LEXAPRO ) 5 MG tablet Take 2.5 mg by mouth at bedtime. 01/22/20  Yes [provider]  esomeprazole  (NEXIUM ) 40 MG capsule Take 40 mg by mouth daily.   Yes [provider]  famotidine  (PEPCID ) 40 MG tablet Take 40 mg by mouth at bedtime. 08/22/18  Yes [provider]  furosemide  (LASIX ) 20 MG tablet Take 2 tablets (40 mg total) by mouth in the morning. May also take 1 tablet (20 mg total) as needed. 07/02/24  Yes Patwardhan, Manish J, MD  gabapentin  (NEURONTIN ) 600 MG tablet Take 1 tablet by mouth 3 (three) times daily.   Yes [provider]  Histamine Dihydrochloride (AUSTRALIAN DREAM ARTHRITIS) 0.025 % CREA Apply 1 application topically 4 (four) times daily as needed (spasms/joint pain.).   Yes  [provider]  HYDROcodone -acetaminophen  (NORCO/VICODIN) 5-325 MG tablet Take 1 tablet by mouth every 6 (six) hours as needed. 05/21/24  Yes [provider]  levothyroxine  (SYNTHROID , LEVOTHROID) 50 MCG tablet Take 25 mcg by mouth every Monday, Wednesday, and Friday. Skip Sunday, Tuesday, Thursday, Saturday 08/09/18  Yes [provider]  losartan  (COZAAR ) 50 MG tablet Take 50 mg by mouth 2 (two) times daily. 12/13/19  Yes [provider]  montelukast (SINGULAIR) 10 MG tablet Take 10 mg by mouth daily as needed.   Yes [provider]  Multiple Vitamins-Minerals (MULTI + OMEGA-3 ADULT GUMMIES PO) Take 2 tablets by mouth in the morning.   Yes [provider]  omeprazole (PRILOSEC) 40 MG capsule Take 40 mg by mouth daily.   Yes [provider]  Rivaroxaban  (XARELTO ) 20 MG TABS tablet Take 20 mg by mouth daily at 4 PM. 08/11/13  Yes Early, Krystal FALCON, MD  simvastatin  (ZOCOR ) 20 MG tablet Take 20 mg by mouth at bedtime. 05/28/13  Yes [provider]  tirzepatide MELANEE) 2.5 MG/0.5ML Pen inject weekly as directed Subcutaneous; Duration: 30 days expect to increase dose every 1-2 months as tolerated 09/12/24  Yes [provider]    Physical Exam: Vitals:   10/31/24 1831 10/31/24 1832 10/31/24 2146 10/31/24 2228  BP: (!) 162/66  (!) 119/44 (!) 144/85  Pulse: 67  (!) 59 64  Resp: 18  18 18   Temp: 98 F (36.7 C)  98.1 F (36.7 C) 98 F (36.7 C)  TempSrc: Oral  Oral Oral  SpO2: 100%  94% 100%  Weight:  124.3 kg    Height:  5' 4 (1.626 m)     Physical Exam:  General: No acute distress, well developed, well nourished HEENT: Normocephalic, atraumatic, PERRL Cardiovascular: Normal rate and rhythm. Distal pulses intact. Pulmonary: Normal pulmonary effort, normal breath sounds Gastrointestinal: Distended abdomen, soft, non-tender, normoactive bowel sounds Musculoskeletal:Non pitting large lower ext Skin: Skin is warm and  dry. Neuro: Strength examination is deferred as the patient has severe pain whenever she moves., AAOx3. PSYCH: Attentive and cooperative  Data Reviewed:  Results for orders placed or performed during the hospital encounter of 10/31/24 (from the past 24  hours)  CBC     Status: Abnormal   Collection Time: 10/31/24  8:25 PM  Result Value Ref Range   WBC 10.7 (H) 4.0 - 10.5 K/uL   RBC 3.97 3.87 - 5.11 MIL/uL   Hemoglobin 11.4 (L) 12.0 - 15.0 g/dL   HCT 64.3 (L) 63.9 - 53.9 %   MCV 89.7 80.0 - 100.0 fL   MCH 28.7 26.0 - 34.0 pg   MCHC 32.0 30.0 - 36.0 g/dL   RDW 84.0 (H) 88.4 - 84.4 %   Platelets 270 150 - 400 K/uL   nRBC 0.0 0.0 - 0.2 %  Basic metabolic panel     Status: Abnormal   Collection Time: 10/31/24  8:25 PM  Result Value Ref Range   Sodium 133 (L) 135 - 145 mmol/L   Potassium 4.9 3.5 - 5.1 mmol/L   Chloride 92 (L) 98 - 111 mmol/L   CO2 27 22 - 32 mmol/L   Glucose, Bld 115 (H) 70 - 99 mg/dL   BUN 35 (H) 8 - 23 mg/dL   Creatinine, Ser 8.27 (H) 0.44 - 1.00 mg/dL   Calcium  10.3 8.9 - 10.3 mg/dL   GFR, Estimated 30 (L) >60 mL/min   Anion gap 14 5 - 15    Head CT IMPRESSION: 1. No acute intracranial abnormality. 2. Partially visualized rounded soft tissue lesion along the inferior aspect of the right ear measuring at least 1.5 cm, likely present on prior studies but significantly increased in size. Recommend correlation with findings on physical examination.   CT of Pelvis  IMPRESSION: 1. Considerable widening of the L5-1 disc space with associated soft tissue edema and a fracture line extending through the posterior elements in an area of prior lumbar fusion; anterior widening of the disc space is approximately 2.8 cm with up to 5 mm distraction in the fracture plane, better evaluated on the concomitant lumbar spine CT. 2. Left hip arthroplasty changes. 3. Degenerative changes of the right hip joint with remodeling of the femoral head.   L spine  CT IMPRESSION: 1. Hyperextension injury, with distraction of the L5-S1 disc space measuring 2.6 cm and Chance type fracture through the bilateral L5-S1 facets. Soft tissue edema coupled with a circumferential disc bulge and left paracentral disc protrusion results in severe central canal stenosis at this level. 2. Extensive thoracolumbar posterior spinal fusion as above. 3.  Aortic Atherosclerosis (ICD10-I70.0).  Assessment and Plan: Acute lumbar fracture after fall - neurosurgery consulted -They recommend to keep head of bed less than 30 degrees and bedrest - Pain control  History of PE -she does have an IVC filter in place and is on Xarelto .  Her last dose of Xarelto  was 1/13.  Will continue to hold that in case she does need to go to the OR. Morbid obesity -she has just started taking GLP1 for weight loss.  No history of diabetes mellitus.  She has had a total of 2 doses.  Will hold that while hospitalized. CKD -her creatinine today is 1.72.  Her last creatinine 2 years ago was 1.29.  Will put the patient on some IV fluids and monitor. Mild aortic stenosis Hypothyroidism - continue Synthroid  7. Lymphedema of both lower ext.   Advance Care Planning:   Code Status: Prior  The patient names her husband as her surrogate decision maker and she wants to be full code.  Consults: Neurosurgery  Family Communication: Daughter-in-law is at bedside  Severity of Illness: The appropriate patient status for this patient  is INPATIENT. Inpatient status is judged to be reasonable and necessary in order to provide the required intensity of service to ensure the patient's safety. The patient's presenting symptoms, physical exam findings, and initial radiographic and laboratory data in the context of their chronic comorbidities is felt to place them at high risk for further clinical deterioration. Furthermore, it is not anticipated that the patient will be medically stable for discharge from the hospital  within 2 midnights of admission.   * I certify that at the point of admission it is my clinical judgment that the patient will require inpatient hospital care spanning beyond 2 midnights from the point of admission due to high intensity of service, high risk for further deterioration and high frequency of surveillance required.*  Author: ARTHEA CHILD, MD 10/31/2024 10:59 PM  For on call review [email or web address].      [1]  Allergies Allergen Reactions   Celecoxib  Rash   "

## 2024-10-31 NOTE — ED Provider Notes (Signed)
 " [place] EMERGENCY DEPARTMENT AT [facility] Provider Note   CSN: [id number] Arrival date & time: 10/31/24  1825     Patient presents with: [name] is a 79 y.o. female.   79 year old female presenting emergency department for evaluation after a fall last night.  Not sure what made her fall, but she fell straight backwards onto her back.  Does not think she hit her head.  Denies LOC.  She is on Xarelto .  Was able to be helped to the chair by family members with significant help, ambulates with a walker at baseline.  Today had worsening pain to head, neck, low back, right hip and right knee.  No chest pain shortness of breath abdominal pain nausea vomiting.  At baseline per family members at bedside.   Fall       Prior to Admission medications  Medication Sig Start Date End Date Taking? Authorizing Provider  ALPRAZolam  (XANAX ) 0.5 MG tablet Take 0.5 mg by mouth 2 (two) times daily. 07/17/13  Yes [provider]  ascorbic acid  (VITAMIN C) 500 MG tablet Take 2,000 mg by mouth daily.   Yes [provider]  atenolol  (TENORMIN ) 25 MG tablet Take 25 mg by mouth See admin instructions. Take one tablet (25 mg) by mouth every morning, may also take a 2nd tablet (25 mg) if SBP >150 06/11/13  Yes [provider]  Bacillus Coagulans-Inulin (PROBIOTIC-PREBIOTIC PO) Take 1 tablet by mouth in the morning.   Yes [provider]  baclofen  (LIORESAL ) 10 MG tablet Take 1 tablet (10 mg total) by mouth every 8 (eight) hours. 12/07/13  Yes Babish, Donnice, PA-C  escitalopram  (LEXAPRO ) 5 MG tablet Take 2.5 mg by mouth at bedtime. 01/22/20  Yes [provider]  esomeprazole  (NEXIUM ) 40 MG capsule Take 40 mg by mouth daily.   Yes [provider]  famotidine  (PEPCID ) 40 MG tablet Take 40 mg by mouth at bedtime. 08/22/18  Yes [provider]  furosemide  (LASIX ) 20 MG tablet Take 2 tablets (40 mg total) by mouth in the morning.  May also take 1 tablet (20 mg total) as needed. 07/02/24  Yes Patwardhan, Manish J, MD  gabapentin  (NEURONTIN ) 600 MG tablet Take 1 tablet by mouth 3 (three) times daily.   Yes [provider]  Histamine Dihydrochloride (AUSTRALIAN DREAM ARTHRITIS) 0.025 % CREA Apply 1 application topically 4 (four) times daily as needed (spasms/joint pain.).   Yes [provider]  HYDROcodone -acetaminophen  (NORCO/VICODIN) 5-325 MG tablet Take 1 tablet by mouth every 6 (six) hours as needed. 05/21/24  Yes [provider]  levothyroxine  (SYNTHROID , LEVOTHROID) 50 MCG tablet Take 25 mcg by mouth every Monday, Wednesday, and Friday. Skip Sunday, Tuesday, Thursday, Saturday 08/09/18  Yes [provider]  losartan  (COZAAR ) 50 MG tablet Take 50 mg by mouth 2 (two) times daily. 12/13/19  Yes [provider]  montelukast (SINGULAIR) 10 MG tablet Take 10 mg by mouth daily as needed.   Yes [provider]  Multiple Vitamins-Minerals (MULTI + OMEGA-3 ADULT GUMMIES PO) Take 2 tablets by mouth in the morning.   Yes [provider]  omeprazole (PRILOSEC) 40 MG capsule Take 40 mg by mouth daily.   Yes [provider]  Rivaroxaban  (XARELTO ) 20 MG TABS tablet Take 20 mg by mouth daily at 4 PM. 08/11/13  Yes Early, Krystal FALCON, MD  simvastatin  (ZOCOR ) 20 MG tablet Take 20 mg by mouth at bedtime. 05/28/13  Yes [provider]  tirzepatide (ZEPBOUND) 2.5 MG/0.5ML Pen inject weekly as directed Subcutaneous; Duration: 30 days expect to increase dose every 1-2 months as tolerated 09/12/24  Yes [provider]    Allergies: Celecoxib     Review of Systems  Updated Vital Signs BP (!) 144/85   Pulse 64   Temp 98 F (36.7 C) (Oral)   Resp 18   Ht 5' 4 (1.626 m)   Wt 124.3 kg   LMP  (LMP Unknown)   SpO2 100%   BMI 47.03 kg/m   Physical Exam Vitals and nursing note reviewed.  Constitutional:      General: She is not in acute distress.     Appearance: She is obese. She is not toxic-appearing.  HENT:     Mouth/Throat:     Mouth: Mucous membranes are moist.  Eyes:     Conjunctiva/sclera: Conjunctivae normal.  Cardiovascular:     Rate and Rhythm: Normal rate and regular rhythm.     Pulses: Normal pulses.  Pulmonary:     Effort: Pulmonary effort is normal.  Abdominal:     General: Abdomen is flat. There is no distension.     Palpations: Abdomen is soft.     Tenderness: There is no abdominal tenderness. There is no guarding or rebound.  Musculoskeletal:        General: Tenderness present. Normal range of motion.     Cervical back: Tenderness present.     Comments: Tenderness to the right knee and right hip.  No obvious deformity.  Equal pulses.  Chest wall and pelvis are stable.  Normal sensation in all extremities.  Able to wiggle toes, equal grip strength.    Skin:    General: Skin is warm.     Capillary Refill: Capillary refill takes less than 2 seconds.  Neurological:     General: No focal deficit present.     Mental Status: She is alert and oriented to person, place, and time.  Psychiatric:        Mood and Affect: Mood normal.        Behavior: Behavior normal.        Thought Content: Thought content normal.     (all labs ordered are listed, but only abnormal results are displayed) Labs Reviewed  CBC - Abnormal; Notable for the following components:      Result Value   WBC 10.7 (*)    Hemoglobin 11.4 (*)    HCT 35.6 (*)    RDW 15.9 (*)    All other components within normal limits  BASIC METABOLIC PANEL WITH GFR - Abnormal; Notable for the following components:   Sodium 133 (*)    Chloride 92 (*)    Glucose, Bld 115 (*)    BUN 35 (*)    Creatinine, Ser 1.72 (*)    GFR, Estimated 30 (*)    All other components within normal limits    EKG: None  Radiology: CT Lumbar Spine Wo Contrast Result Date: 10/31/2024 CLINICAL DATA:  [place] EXAM: CT LUMBAR SPINE WITHOUT CONTRAST TECHNIQUE: Multidetector CT  imaging of the lumbar spine was performed without intravenous contrast administration. Multiplanar CT image reconstructions were also generated. RADIATION DOSE REDUCTION: This exam was performed according to the departmental dose-optimization program which includes automated exposure control, adjustment of the mA and/or kV according to patient size and/or use of iterative reconstruction technique. COMPARISON:  10/01/2021 FINDINGS: Segmentation: 5 lumbar type vertebrae. Lumbarization of the S1 vertebral body is again noted. Alignment: There is  a hyper extension injury with distraction across the L5-S1 disc space measuring 2.6 cm. Otherwise alignment is anatomic. Vertebrae: There is a hyper extension injury with a transverse fracture through the bilateral facet joints at the L5-S1 level. Approximally 2.6 cm of distraction across the L5-S1 disc space. No other acute bony abnormalities. Multilevel thoracolumbar spinal fusion is again identified, with intra corporal screws seen at the T9, T11, T12, L1, L2, and L3 levels. Bony fusion across the intervertebral discs from T9 through L1 as well as L2 through L5. Paraspinal and other soft tissues: There is paraspinal soft tissue swelling at the L5-S1 level. Atherosclerosis of the aorta and its distal branches. IVC filter is noted. Disc levels: No significant bony encroachment upon the central canal or neural foramina. Thoracolumbar posterior spinal fusion as described above. At the L5-S1 level at the site of hyperextension injury and transverse fracture through the posterior elements, there is circumferential disc bulge with a left paracentral disc protrusion. This results in severe central canal stenosis. Reconstructed images demonstrate no additional findings. IMPRESSION: 1. Hyperextension injury, with distraction of the L5-S1 disc space measuring 2.6 cm and Chance type fracture through the bilateral L5-S1 facets. Soft tissue edema coupled with a circumferential disc bulge  and left paracentral disc protrusion results in severe central canal stenosis at this level. 2. Extensive thoracolumbar posterior spinal fusion as above. 3.  Aortic Atherosclerosis (ICD10-I70.0). Critical Value/emergent results were called by telephone at the time of interpretation on 10/31/2024 at 9:24 pm to provider [facility] , who verbally acknowledged these results. Electronically Signed   By: Ozell Daring M.D.   On: 10/31/2024 21:24   CT PELVIS WO CONTRAST Result Date: 10/31/2024 EXAM: CT PELVIS, WITHOUT IV CONTRAST 10/31/2024 08:55:05 PM TECHNIQUE: Axial images were acquired through the pelvis without IV contrast. Reformatted images were reviewed. Automated exposure control, iterative reconstruction, and/or weight based adjustment of the mA/kV was utilized to reduce the radiation dose to as low as reasonably achievable. COMPARISON: None available. CLINICAL HISTORY: Recent fall. FINDINGS: BONES: Changes consistent with left hip replacement are noted. Prior bone graft harvest is noted in the left iliac bone adjacent to the sacrum. No sacral abnormality is seen. Degenerative and postoperative changes in the lower lumbar spine are seen. At L5-S1, there is considerable widening of the disc space with associated soft tissue edema. Numbering is similar to that seen on prior lumbar spine imaging. A fracture line is noted extending through posterior elements at L5-S1 in an area of prior fusion. The widening of the disc space anteriorly is approximately 2.8 cm. The maximum area of distraction in the fracture plane measures approximately 5 mm. This will be better evaluated on concomitant lumbar spine CT. JOINTS: Changes consistent with left hip replacement are noted. Degenerative changes of the right hip joint are noted. Some remodeling of the femoral head is seen. SOFT TISSUES: Aortic calcifications are noted. IVC filter is noted in satisfactory position. Soft tissue edema is associated with the L5-S1 disc space  widening and fracture. INTRAPELVIC CONTENTS: Visualized bowel is within normal limits. The uterus has been surgically removed. No free fluid is seen. IMPRESSION: 1. Considerable widening of the L5-1 disc space with associated soft tissue edema and a fracture line extending through the posterior elements in an area of prior lumbar fusion; anterior widening of the disc space is approximately 2.8 cm with up to 5 mm distraction in the fracture plane, better evaluated on the concomitant lumbar spine CT. 2. Left hip arthroplasty changes. 3. Degenerative  changes of the right hip joint with remodeling of the femoral head. Electronically signed by: Oneil Devonshire MD 10/31/2024 09:10 PM EST RP Workstation: [id number]   CT Cervical Spine Wo Contrast Result Date: 10/31/2024 EXAM: CT CERVICAL SPINE WITHOUT CONTRAST 10/31/2024 08:55:05 PM TECHNIQUE: CT of the cervical spine was performed without the administration of intravenous contrast. Multiplanar reformatted images are provided for review. Automated exposure control, iterative reconstruction, and/or weight based adjustment of the mA/kV was utilized to reduce the radiation dose to as low as reasonably achievable. COMPARISON: 05/30/2023 CLINICAL HISTORY: Neck trauma (Age >= 65y). FINDINGS: BONES AND ALIGNMENT: Reversal of the normal cervical lordosis, similar to prior. Trace degenerative retrolisthesis of C2 on C3, C5 on C6, and C6 on C7. Trace degenerative anterolisthesis of C7 on T1, T1 on T2, and T2 on T3. Anterior cervical fusion hardware at C3-C4. Solid osseous fusion from the C3 to C5 levels. No acute fracture or traumatic malalignment. DEGENERATIVE CHANGES: Severe disc space narrowing at C5-C6 and C6-C7. Osteophyte complexes at multiple levels. There is at least mild osseous spinal canal stenosis at C5-C6 and C6-C7. Facet arthrosis and uncovertebral hypertrophy at multiple levels. There is severe foraminal stenosis at C5-C6 and C6-C7. SOFT TISSUES: No prevertebral soft  tissue swelling. IMPRESSION: 1. No acute traumatic injury of the cervical spine. 2. Degenerative changes as above. Electronically signed by: Donnice Mania MD 10/31/2024 09:09 PM EST RP Workstation: [id number]   CT Head Wo Contrast Result Date: 10/31/2024 EXAM: CT HEAD WITHOUT CONTRAST 10/31/2024 08:55:05 PM TECHNIQUE: CT of the head was performed without the administration of intravenous contrast. Automated exposure control, iterative reconstruction, and/or weight based adjustment of the mA/kV was utilized to reduce the radiation dose to as low as reasonably achievable. COMPARISON: 05/30/2023 CLINICAL HISTORY: Head trauma, minor (Age >= 65y) FINDINGS: BRAIN AND VENTRICLES: No acute hemorrhage. No evidence of acute infarct. No hydrocephalus. No extra-axial collection. No mass effect or midline shift. Mild chronic microvascular ischemic changes. Atherosclerosis of the carotid siphons. ORBITS: Left lens replacement. SINUSES: No acute abnormality. SOFT TISSUES AND SKULL: There is a partially visualized rounded soft tissue lesion along the inferior aspect of the right ear which appears to be measuring at least 1.5 cm (series 3, image 1). This finding is likely present on prior studies but appears significantly increased in size. Recommend correlation with physical exam findings. No skull fracture. IMPRESSION: 1. No acute intracranial abnormality. 2. Partially visualized rounded soft tissue lesion along the inferior aspect of the right ear measuring at least 1.5 cm, likely present on prior studies but significantly increased in size. Recommend correlation with findings on physical examination. Electronically signed by: Donnice Mania MD 10/31/2024 09:03 PM EST RP Workstation: [id number]   DG Knee 2 Views Right Result Date: 10/31/2024 CLINICAL DATA:  Unwitnessed fall EXAM: DG KNEE 1-2V*R* COMPARISON:  12/12/2006 FINDINGS: Interval revision of the knee arthroplasty compared to remote exam from 2008. No definitive  fracture is seen. Dystrophic appearing calcifications within the anterior infrapatellar soft tissues. No sizable knee effusion IMPRESSION: Knee arthroplasty without definitive fracture on limited two views. Electronically Signed   By: Luke Bun M.D.   On: 10/31/2024 20:15   DG Chest 2 View Result Date: 10/31/2024 EXAM: 2 VIEW(S) XRAY OF THE CHEST 10/31/2024 08:08:00 PM COMPARISON: None available. CLINICAL HISTORY: fall fall fall fall fall FINDINGS: LUNGS AND PLEURA: Low lung volumes. Left basilar atelectasis. No pleural effusion. No pneumothorax. HEART AND MEDIASTINUM: Aortic arch calcifications. No acute abnormality of the cardiac and mediastinal silhouettes. BONES AND  SOFT TISSUES: Thoracic spine surgical hardware noted. Right shoulder arthroplasty noted. Cervical spine surgical hardware noted. IMPRESSION: 1. No acute cardiopulmonary abnormality identified. 2. Low lung volumes with left basilar atelectasis. Electronically signed by: Dorethia Molt MD 10/31/2024 08:13 PM EST RP Workstation: [id number]     Procedures   Medications Ordered in the ED  lidocaine  (LIDODERM ) 5 % 1 patch (1 patch Transdermal Patch Applied 10/31/24 1909)  sodium chloride  0.9 % bolus 1,000 mL (has no administration in time range)  acetaminophen  (TYLENOL ) tablet 1,000 mg (1,000 mg Oral Given 10/31/24 1910)  morphine  (PF) 4 MG/ML injection 4 mg (4 mg Intramuscular Given 10/31/24 1934)  morphine  (PF) 4 MG/ML injection 4 mg (4 mg Intravenous Given 10/31/24 2028)  dexamethasone  (DECADRON ) injection 10 mg (10 mg Intravenous Given 10/31/24 2213)  morphine  (PF) 4 MG/ML injection 4 mg (4 mg Intravenous Given 10/31/24 2213)    Clinical Course as of 10/31/24 2339  Wed Oct 31, 2024  2105 CT Head Wo Contrast MPRESSION: 1. No acute intracranial abnormality. 2. Partially visualized rounded soft tissue lesion along the inferior aspect of the right ear measuring at least 1.5 cm, likely present on prior studies but significantly  increased in size. Recommend correlation with findings on physical examination.   [TY]  2126 CT Lumbar Spine Wo Contrast Chance type l5. 2.5cm bicet fractures. Severe canal stenosis.  [TY]  2142 CT Lumbar Spine Wo Contrast IMPRESSION: 1. Hyperextension injury, with distraction of the L5-S1 disc space measuring 2.6 cm and Chance type fracture through the bilateral L5-S1 facets. Soft tissue edema coupled with a circumferential disc bulge and left paracentral disc protrusion results in severe central canal stenosis at this level. 2. Extensive thoracolumbar posterior spinal fusion as above. 3.  Aortic Atherosclerosis (ICD10-I70.0).  Critical Value/emergent results were called by telephone at the time of interpretation on 10/31/2024 at 9:24 pm to provider [facility] , who verbally acknowledged these results.   Electronically Signed   By: Ozell Daring M.D.   On: 10/31/2024 21:24   [TY]  2209 Spoke to Dr. Mavis with neurosurgery who reviewed imaging.  Recommending admission to the hospitalist team, pain management.  Spinal precautions/bedrest, can have head of bed up to 30 degrees.  Will evaluate tomorrow for further intervention. [TY]  2256 Spoke with hospitalist Dr. Clairborne who will admit patient. [TY]    Clinical Course User Index [TY] Neysa Caron PARAS, DO                                 Medical Decision Making This is a 79 year old female with morbid obesity hypertension hyperlipidemia, history of PE/DVT, multiple orthopedic surgeries to include right knee as well as kyphoplasties. Family ember notes she is at her mental baseline. She is on Xarelto .  EMS reported stable vitals and gave 50 mcg of fentanyl  and route.  Does appear she has some chronic pain issues that she is on baclofen  and Xanax , gabapentin , Norco Vicodin and does seem like she has some ambulatory dysfunction at baseline with getting around with a walker.  Given comorbidities will get CT head, C-spine, lumbar and  pelvis to evaluate for traumatic injury.  Will get chest x-ray.  Low suspicion for abdominal trauma/pathology given reassuring vitals and no abdominal pain or tenderness on exam.  Get x-ray of right knee.  Multimodal pain medications ordered.  Will also get screening labs as well and EKG.  See ED course for further MDM  and final disposition.  Amount and/or Complexity of Data Reviewed Labs: ordered. Radiology: ordered. Decision-making details documented in ED Course. ECG/medicine tests: ordered.  Risk OTC drugs. Prescription drug management. Decision regarding hospitalization.       Final diagnoses:  Other closed fracture of fifth lumbar vertebra, initial encounter [facility])    ED Discharge Orders     None          Neysa Caron PARAS, DO 10/31/24 2339  "

## 2024-10-31 NOTE — Consult Note (Signed)
 I was contacted Dr. Neysa regarding this patient.   She has had multiple lumbar surgeries including an L4-5 fusion by Dr. Margart in in 1993 and subsequent removal of hardware in 2004. She has had multiple previous other thoracolumbar fusions. She failed home at approximately 6:00 p.m. last evening.  She had persistent back pain and pain to the ER this evening.    By report the patient is neurovascularly intact in complains of back pain.  He lumbar CT scan demonstrated a Chance fracture through her old L4-5 fusion with significant distraction.  I discussed situation with Dr. Neysa.  I recommend she be admitted by the hospitalist and kept at bedrest with head of bed less than 30 for the time being.  I will inform Dr. Colon or admission and he will likely see her tomorrow to determine  the definitive treatment plan.  She will likely need a repeat lumbar instrumentation and fusion.

## 2024-10-31 NOTE — ED Triage Notes (Signed)
 Pt arrived via [facility] from home. Pt had unwitnessed fall yesterday at 1800 and fell backwards states seh fell onto buttocks, but husband states he feels she hit her head. Pt c/o right hip pain. Per EMS, pt has bruising on right posterior of scapula, right inner bicep, buttocks. EMS gave fentanyl  50 mcg intranasally.

## 2024-11-01 ENCOUNTER — Other Ambulatory Visit: Payer: Self-pay | Admitting: Neurological Surgery

## 2024-11-01 ENCOUNTER — Other Ambulatory Visit: Payer: Self-pay

## 2024-11-01 ENCOUNTER — Inpatient Hospital Stay (HOSPITAL_COMMUNITY)

## 2024-11-01 ENCOUNTER — Inpatient Hospital Stay (HOSPITAL_COMMUNITY): Admitting: Anesthesiology

## 2024-11-01 ENCOUNTER — Encounter (HOSPITAL_COMMUNITY): Payer: Self-pay | Admitting: Internal Medicine

## 2024-11-01 ENCOUNTER — Encounter (HOSPITAL_COMMUNITY)
Admission: EM | Disposition: A | Discharge status: Skilled Nursing Facility | Payer: Self-pay | Source: Home / Self Care | Attending: Internal Medicine

## 2024-11-01 DIAGNOSIS — I350 Nonrheumatic aortic (valve) stenosis: Secondary | ICD-10-CM | POA: Diagnosis not present

## 2024-11-01 DIAGNOSIS — J449 Chronic obstructive pulmonary disease, unspecified: Secondary | ICD-10-CM

## 2024-11-01 DIAGNOSIS — N1831 Chronic kidney disease, stage 3a: Secondary | ICD-10-CM | POA: Diagnosis not present

## 2024-11-01 DIAGNOSIS — I10 Essential (primary) hypertension: Secondary | ICD-10-CM | POA: Diagnosis not present

## 2024-11-01 DIAGNOSIS — I2510 Atherosclerotic heart disease of native coronary artery without angina pectoris: Secondary | ICD-10-CM

## 2024-11-01 DIAGNOSIS — S32049A Unspecified fracture of fourth lumbar vertebra, initial encounter for closed fracture: Secondary | ICD-10-CM | POA: Diagnosis not present

## 2024-11-01 DIAGNOSIS — S32059A Unspecified fracture of fifth lumbar vertebra, initial encounter for closed fracture: Secondary | ICD-10-CM | POA: Diagnosis not present

## 2024-11-01 HISTORY — PX: LAMINECTOMY WITH POSTERIOR LATERAL ARTHRODESIS LEVEL 1: SHX6335

## 2024-11-01 LAB — CBC
HCT: 33.1 % — ABNORMAL LOW (ref 36.0–46.0)
Hemoglobin: 10.6 g/dL — ABNORMAL LOW (ref 12.0–15.0)
MCH: 28.6 pg (ref 26.0–34.0)
MCHC: 32 g/dL (ref 30.0–36.0)
MCV: 89.5 fL (ref 80.0–100.0)
Platelets: 250 K/uL (ref 150–400)
RBC: 3.7 MIL/uL — ABNORMAL LOW (ref 3.87–5.11)
RDW: 15.9 % — ABNORMAL HIGH (ref 11.5–15.5)
WBC: 9.9 K/uL (ref 4.0–10.5)
nRBC: 0 % (ref 0.0–0.2)

## 2024-11-01 LAB — SURGICAL PCR SCREEN
MRSA, PCR: NEGATIVE
Staphylococcus aureus: NEGATIVE

## 2024-11-01 LAB — BASIC METABOLIC PANEL WITH GFR
Anion gap: 9 (ref 5–15)
BUN: 34 mg/dL — ABNORMAL HIGH (ref 8–23)
CO2: 29 mmol/L (ref 22–32)
Calcium: 9.9 mg/dL (ref 8.9–10.3)
Chloride: 95 mmol/L — ABNORMAL LOW (ref 98–111)
Creatinine, Ser: 1.52 mg/dL — ABNORMAL HIGH (ref 0.44–1.00)
GFR, Estimated: 35 mL/min — ABNORMAL LOW
Glucose, Bld: 149 mg/dL — ABNORMAL HIGH (ref 70–99)
Potassium: 5.3 mmol/L — ABNORMAL HIGH (ref 3.5–5.1)
Sodium: 133 mmol/L — ABNORMAL LOW (ref 135–145)

## 2024-11-01 LAB — PREPARE RBC (CROSSMATCH)

## 2024-11-01 MED ORDER — PHENYLEPHRINE HCL-NACL 20-0.9 MG/250ML-% IV SOLN
INTRAVENOUS | Status: DC | PRN
  Administered 2024-11-01: 50 ug/min via INTRAVENOUS

## 2024-11-01 MED ORDER — BISACODYL 10 MG RE SUPP
10.0000 mg | Freq: Every day | RECTAL | Status: DC | PRN
  Administered 2024-11-05: 10 mg via RECTAL
  Filled 2024-11-01: qty 1

## 2024-11-01 MED ORDER — LEVOTHYROXINE SODIUM 25 MCG PO TABS
25.0000 ug | ORAL_TABLET | ORAL | Status: DC
  Administered 2024-11-02 – 2024-11-19 (×8): 25 ug via ORAL
  Filled 2024-11-01 (×8): qty 1

## 2024-11-01 MED ORDER — MIDAZOLAM HCL (PF) 2 MG/2ML IJ SOLN
INTRAMUSCULAR | Status: DC | PRN
  Administered 2024-11-01: 1 mg via INTRAVENOUS

## 2024-11-01 MED ORDER — PROPOFOL 10 MG/ML IV BOLUS
INTRAVENOUS | Status: DC | PRN
  Administered 2024-11-01: 150 mg via INTRAVENOUS

## 2024-11-01 MED ORDER — HYDROMORPHONE HCL 1 MG/ML IJ SOLN
0.2500 mg | INTRAMUSCULAR | Status: DC | PRN
  Administered 2024-11-01: 0.5 mg via INTRAVENOUS

## 2024-11-01 MED ORDER — LIDOCAINE-EPINEPHRINE 1 %-1:100000 IJ SOLN
INTRAMUSCULAR | Status: AC
  Filled 2024-11-01: qty 1

## 2024-11-01 MED ORDER — PANTOPRAZOLE SODIUM 40 MG PO TBEC
40.0000 mg | DELAYED_RELEASE_TABLET | Freq: Every day | ORAL | Status: DC
  Administered 2024-11-01 – 2024-11-20 (×19): 40 mg via ORAL
  Filled 2024-11-01 (×20): qty 1

## 2024-11-01 MED ORDER — DEXAMETHASONE SOD PHOSPHATE PF 10 MG/ML IJ SOLN
INTRAMUSCULAR | Status: AC
  Filled 2024-11-01: qty 1

## 2024-11-01 MED ORDER — ESCITALOPRAM OXALATE 10 MG PO TABS
5.0000 mg | ORAL_TABLET | Freq: Every day | ORAL | Status: DC
  Administered 2024-11-02 – 2024-11-10 (×9): 5 mg via ORAL
  Filled 2024-11-01 (×10): qty 1

## 2024-11-01 MED ORDER — DOCUSATE SODIUM 100 MG PO CAPS
100.0000 mg | ORAL_CAPSULE | Freq: Two times a day (BID) | ORAL | Status: DC
  Administered 2024-11-02 – 2024-11-20 (×34): 100 mg via ORAL
  Filled 2024-11-01 (×36): qty 1

## 2024-11-01 MED ORDER — MEPERIDINE HCL 25 MG/ML IJ SOLN: 6.2500 mg | INTRAMUSCULAR | Status: DC | PRN

## 2024-11-01 MED ORDER — HYDROMORPHONE HCL 1 MG/ML IJ SOLN
1.0000 mg | INTRAMUSCULAR | Status: DC | PRN
  Administered 2024-11-01 – 2024-11-02 (×6): 1 mg via INTRAVENOUS
  Filled 2024-11-01 (×6): qty 1

## 2024-11-01 MED ORDER — FENTANYL CITRATE (PF) 250 MCG/5ML IJ SOLN
INTRAMUSCULAR | Status: DC | PRN
  Administered 2024-11-01: 200 ug via INTRAVENOUS
  Administered 2024-11-01: 50 ug via INTRAVENOUS

## 2024-11-01 MED ORDER — HYDROMORPHONE HCL 1 MG/ML IJ SOLN
0.5000 mg | INTRAMUSCULAR | Status: DC | PRN
  Administered 2024-11-02 (×3): 0.5 mg via INTRAVENOUS
  Filled 2024-11-01 (×3): qty 0.5

## 2024-11-01 MED ORDER — LABETALOL HCL 5 MG/ML IV SOLN
10.0000 mg | Freq: Once | INTRAVENOUS | Status: AC
  Administered 2024-11-01: 10 mg via INTRAVENOUS

## 2024-11-01 MED ORDER — SODIUM CHLORIDE 0.9 % IV SOLN: INTRAVENOUS | Status: AC

## 2024-11-01 MED ORDER — METHOCARBAMOL 500 MG PO TABS
500.0000 mg | ORAL_TABLET | Freq: Four times a day (QID) | ORAL | Status: DC | PRN
  Administered 2024-11-02 – 2024-11-20 (×29): 500 mg via ORAL
  Filled 2024-11-01 (×31): qty 1

## 2024-11-01 MED ORDER — LABETALOL HCL 5 MG/ML IV SOLN
10.0000 mg | INTRAVENOUS | Status: DC | PRN
  Administered 2024-11-01: 10 mg via INTRAVENOUS

## 2024-11-01 MED ORDER — FLEET ENEMA RE ENEM: 1.0000 | ENEMA | Freq: Once | RECTAL | Status: DC | PRN

## 2024-11-01 MED ORDER — RISAQUAD PO CAPS
1.0000 | ORAL_CAPSULE | Freq: Every morning | ORAL | Status: DC
  Administered 2024-11-01 – 2024-11-20 (×19): 1 via ORAL
  Filled 2024-11-01 (×21): qty 1

## 2024-11-01 MED ORDER — LACTATED RINGERS IV SOLN: INTRAVENOUS | Status: DC

## 2024-11-01 MED ORDER — LABETALOL HCL 5 MG/ML IV SOLN
INTRAVENOUS | Status: AC
  Filled 2024-11-01: qty 4

## 2024-11-01 MED ORDER — HYDROMORPHONE HCL 1 MG/ML IJ SOLN
INTRAMUSCULAR | Status: AC
  Filled 2024-11-01: qty 1

## 2024-11-01 MED ORDER — ALPRAZOLAM 0.5 MG PO TABS
0.5000 mg | ORAL_TABLET | Freq: Two times a day (BID) | ORAL | Status: DC
  Administered 2024-11-01 – 2024-11-07 (×13): 0.5 mg via ORAL
  Filled 2024-11-01 (×3): qty 1
  Filled 2024-11-01: qty 2
  Filled 2024-11-01: qty 1
  Filled 2024-11-01: qty 2
  Filled 2024-11-01 (×8): qty 1

## 2024-11-01 MED ORDER — ORAL CARE MOUTH RINSE: 15.0000 mL | Freq: Once | OROMUCOSAL | Status: AC

## 2024-11-01 MED ORDER — POLYETHYLENE GLYCOL 3350 17 G PO PACK
17.0000 g | PACK | Freq: Every day | ORAL | Status: DC | PRN
  Administered 2024-11-04: 17 g via ORAL

## 2024-11-01 MED ORDER — PHENYLEPHRINE 80 MCG/ML (10ML) SYRINGE FOR IV PUSH (FOR BLOOD PRESSURE SUPPORT)
PREFILLED_SYRINGE | INTRAVENOUS | Status: DC | PRN
  Administered 2024-11-01: 80 ug via INTRAVENOUS

## 2024-11-01 MED ORDER — THROMBIN 5000 UNITS EX SOLR
OROMUCOSAL | Status: DC | PRN
  Administered 2024-11-01: 5 mL via TOPICAL

## 2024-11-01 MED ORDER — HYDROMORPHONE HCL 1 MG/ML IJ SOLN
1.0000 mg | INTRAMUSCULAR | Status: DC | PRN
  Administered 2024-11-01: 1 mg via INTRAVENOUS
  Filled 2024-11-01 (×2): qty 1

## 2024-11-01 MED ORDER — LIDOCAINE-EPINEPHRINE 1 %-1:100000 IJ SOLN
INTRAMUSCULAR | Status: DC | PRN
  Administered 2024-11-01: 5 mL via INTRADERMAL
  Administered 2024-11-01: 9 mL

## 2024-11-01 MED ORDER — SODIUM CHLORIDE 0.9 % IV SOLN: 10.0000 mL/h | Freq: Once | INTRAVENOUS | Status: DC

## 2024-11-01 MED ORDER — ATENOLOL 25 MG PO TABS
25.0000 mg | ORAL_TABLET | Freq: Every day | ORAL | Status: DC
  Administered 2024-11-01 – 2024-11-20 (×17): 25 mg via ORAL
  Filled 2024-11-01 (×20): qty 1

## 2024-11-01 MED ORDER — ACETAMINOPHEN 325 MG PO TABS
650.0000 mg | ORAL_TABLET | ORAL | Status: DC | PRN
  Administered 2024-11-04 – 2024-11-18 (×10): 650 mg via ORAL
  Filled 2024-11-01 (×11): qty 2

## 2024-11-01 MED ORDER — MIDAZOLAM HCL (PF) 2 MG/2ML IJ SOLN: 0.5000 mg | Freq: Once | INTRAMUSCULAR | Status: DC | PRN

## 2024-11-01 MED ORDER — THROMBIN 5000 UNITS EX KIT
PACK | CUTANEOUS | Status: AC
  Filled 2024-11-01: qty 1

## 2024-11-01 MED ORDER — ROCURONIUM BROMIDE 10 MG/ML (PF) SYRINGE
PREFILLED_SYRINGE | INTRAVENOUS | Status: DC | PRN
  Administered 2024-11-01: 70 mg via INTRAVENOUS
  Administered 2024-11-01: 30 mg via INTRAVENOUS

## 2024-11-01 MED ORDER — ALUM & MAG HYDROXIDE-SIMETH 200-200-20 MG/5ML PO SUSP: 30.0000 mL | Freq: Four times a day (QID) | ORAL | Status: DC | PRN

## 2024-11-01 MED ORDER — PROPOFOL 500 MG/50ML IV EMUL
INTRAVENOUS | Status: DC | PRN
  Administered 2024-11-01: 50 ug/kg/min via INTRAVENOUS

## 2024-11-01 MED ORDER — BUPIVACAINE HCL (PF) 0.5 % IJ SOLN
INTRAMUSCULAR | Status: AC
  Filled 2024-11-01: qty 30

## 2024-11-01 MED ORDER — FENTANYL CITRATE (PF) 250 MCG/5ML IJ SOLN
INTRAMUSCULAR | Status: AC
  Filled 2024-11-01: qty 5

## 2024-11-01 MED ORDER — ACETAMINOPHEN 650 MG RE SUPP: 650.0000 mg | RECTAL | Status: DC | PRN

## 2024-11-01 MED ORDER — SODIUM CHLORIDE 0.9 % IV SOLN
250.0000 mL | INTRAVENOUS | Status: AC
  Administered 2024-11-02: 250 mL via INTRAVENOUS

## 2024-11-01 MED ORDER — CHLORHEXIDINE GLUCONATE 0.12 % MT SOLN
15.0000 mL | Freq: Once | OROMUCOSAL | Status: AC
  Administered 2024-11-01: 15 mL via OROMUCOSAL

## 2024-11-01 MED ORDER — ONDANSETRON HCL 4 MG/2ML IJ SOLN
INTRAMUSCULAR | Status: AC
  Filled 2024-11-01: qty 2

## 2024-11-01 MED ORDER — SENNA 8.6 MG PO TABS
1.0000 | ORAL_TABLET | Freq: Two times a day (BID) | ORAL | Status: DC
  Administered 2024-11-02 – 2024-11-20 (×35): 8.6 mg via ORAL
  Filled 2024-11-01 (×37): qty 1

## 2024-11-01 MED ORDER — CEFAZOLIN SODIUM 1 G IJ SOLR
INTRAMUSCULAR | Status: AC
  Filled 2024-11-01: qty 30

## 2024-11-01 MED ORDER — SODIUM CHLORIDE 0.9% FLUSH: 3.0000 mL | INTRAVENOUS | Status: DC | PRN

## 2024-11-01 MED ORDER — MIDAZOLAM HCL 2 MG/2ML IJ SOLN
INTRAMUSCULAR | Status: AC
  Filled 2024-11-01: qty 2

## 2024-11-01 MED ORDER — SODIUM CHLORIDE 0.9% FLUSH
3.0000 mL | Freq: Two times a day (BID) | INTRAVENOUS | Status: DC
  Administered 2024-11-02 – 2024-11-20 (×35): 3 mL via INTRAVENOUS

## 2024-11-01 MED ORDER — GABAPENTIN 300 MG PO CAPS
600.0000 mg | ORAL_CAPSULE | Freq: Three times a day (TID) | ORAL | Status: DC
  Administered 2024-11-01 – 2024-11-13 (×33): 600 mg via ORAL
  Filled 2024-11-01 (×35): qty 2

## 2024-11-01 MED ORDER — ATENOLOL 25 MG PO TABS: 25.0000 mg | ORAL_TABLET | ORAL | Status: DC

## 2024-11-01 MED ORDER — OXYCODONE HCL 5 MG/5ML PO SOLN: 5.0000 mg | Freq: Once | ORAL | Status: DC | PRN

## 2024-11-01 MED ORDER — ATENOLOL 25 MG PO TABS
25.0000 mg | ORAL_TABLET | Freq: Every day | ORAL | Status: DC | PRN
  Administered 2024-11-06 – 2024-11-11 (×3): 25 mg via ORAL
  Filled 2024-11-01 (×5): qty 1

## 2024-11-01 MED ORDER — MENTHOL 3 MG MT LOZG: 1.0000 | LOZENGE | OROMUCOSAL | Status: DC | PRN

## 2024-11-01 MED ORDER — 0.9 % SODIUM CHLORIDE (POUR BTL) OPTIME
TOPICAL | Status: DC | PRN
  Administered 2024-11-01: 1000 mL

## 2024-11-01 MED ORDER — SUGAMMADEX SODIUM 200 MG/2ML IV SOLN
INTRAVENOUS | Status: DC | PRN
  Administered 2024-11-01: 400 mg via INTRAVENOUS

## 2024-11-01 MED ORDER — POLYETHYLENE GLYCOL 3350 17 G PO PACK
17.0000 g | PACK | Freq: Two times a day (BID) | ORAL | Status: AC
  Administered 2024-11-01 – 2024-11-02 (×3): 17 g via ORAL
  Filled 2024-11-01 (×4): qty 1

## 2024-11-01 MED ORDER — ONDANSETRON HCL 4 MG/2ML IJ SOLN
INTRAMUSCULAR | Status: DC | PRN
  Administered 2024-11-01: 4 mg via INTRAVENOUS

## 2024-11-01 MED ORDER — OXYCODONE HCL 5 MG PO TABS: 5.0000 mg | ORAL_TABLET | Freq: Once | ORAL | Status: DC | PRN

## 2024-11-01 MED ORDER — METHOCARBAMOL 1000 MG/10ML IJ SOLN
500.0000 mg | Freq: Four times a day (QID) | INTRAMUSCULAR | Status: DC | PRN
  Administered 2024-11-02 – 2024-11-19 (×5): 500 mg via INTRAVENOUS
  Filled 2024-11-01 (×5): qty 10

## 2024-11-01 MED ORDER — FAMOTIDINE 20 MG PO TABS
40.0000 mg | ORAL_TABLET | Freq: Every day | ORAL | Status: DC
  Administered 2024-11-01 – 2024-11-19 (×18): 40 mg via ORAL
  Filled 2024-11-01 (×19): qty 2

## 2024-11-01 MED ORDER — CEFAZOLIN SODIUM-DEXTROSE 2-4 GM/100ML-% IV SOLN
2.0000 g | Freq: Three times a day (TID) | INTRAVENOUS | Status: AC
  Administered 2024-11-02 (×2): 2 g via INTRAVENOUS
  Filled 2024-11-01 (×2): qty 100

## 2024-11-01 MED ORDER — OXYCODONE HCL 5 MG PO TABS
5.0000 mg | ORAL_TABLET | ORAL | Status: DC | PRN
  Administered 2024-11-02 – 2024-11-03 (×4): 5 mg via ORAL
  Filled 2024-11-01 (×4): qty 1

## 2024-11-01 MED ORDER — BACLOFEN 10 MG PO TABS: 10.0000 mg | ORAL_TABLET | Freq: Three times a day (TID) | ORAL | Status: DC | PRN

## 2024-11-01 MED ORDER — PHENOL 1.4 % MT LIQD: 1.0000 | OROMUCOSAL | Status: DC | PRN

## 2024-11-01 MED ORDER — DEXAMETHASONE SOD PHOSPHATE PF 10 MG/ML IJ SOLN
INTRAMUSCULAR | Status: DC | PRN
  Administered 2024-11-01: 10 mg via INTRAVENOUS

## 2024-11-01 MED ORDER — BUPIVACAINE HCL (PF) 0.5 % IJ SOLN
INTRAMUSCULAR | Status: DC | PRN
  Administered 2024-11-01: 9 mL
  Administered 2024-11-01: 5 mL

## 2024-11-01 NOTE — Consult Note (Signed)
 Reason for Consult:Lumbar fracture dislocation Referring Physician: Hospitalist  [name] is an 79 y.o. female.  HPI:  The patient is a 79 year old individual who is well-known to my practice.  She has had extensive lumbar spinal fusion surgery from the sacrum to the thoracolumbar junction and beyond.  This was done over a number of procedures over many years as she developed progressive adjacent level disease.  Patient had hardware from L3 to the sacrum in the past but this had been removed as her disease progressed cephalad.  The patient had a ground-level fall while working in her kitchen and fell hard onto her buttocks.  She had immediate and severe pain in her low back.  This is persisted.  She is brought into the emergency room yesterday where CT scan demonstrates a fracture dislocation through her old fusion at L4-L5.  She has splaying of the ventral aspect of the intervertebral space at L4-L5.  The fracture is consistent with a Chance fracture is all 3 columns the spine are involved.  She's been advised regarding the need for surgical stabilization.  Past Medical History:  Diagnosis Date   Anemia    Arthritis    osteoarthritis   Claustrophobia    Complication of anesthesia    hard time waking up after her gallbladder surgery 28 years ago   DVT (deep venous thrombosis) (HCC)    left leg   GERD (gastroesophageal reflux disease)    hx of   Heart murmur    Dr. Claudene follows, 'nothing to worry about.'   History of blood transfusion    History of hiatal hernia    small   History of kidney stones    Hypercholesteremia    under control   Hypertension    Hypothyroidism    Mild aortic stenosis    a. by echo 12/2017.   Panic attack    PONV (postoperative nausea and vomiting)    Pulmonary embolism (HCC)    bilateral lungs - 2008 after disc surgery    Past Surgical History:  Procedure Laterality Date   ABDOMINAL HYSTERECTOMY  10/18/1973   partial   APPENDECTOMY  10/19/1971    APPLICATION OF ROBOTIC ASSISTANCE FOR SPINAL PROCEDURE N/A 10/05/2021   Procedure: APPLICATION OF ROBOTIC ASSISTANCE FOR SPINAL PROCEDURE;  Surgeon: [name], MD;  Location: [facility] OR;  Service: Neurosurgery;  Laterality: N/A;   BACK SURGERY     X14 1987-2009   BREAST BIOPSY Left 09/01/2021   CARDIAC CATHETERIZATION  ~2010   CARPAL TUNNEL RELEASE Bilateral    CHOLECYSTECTOMY  10/19/1995   COLONOSCOPY     COLONOSCOPY WITH PROPOFOL  N/A 12/05/2017   Procedure: COLONOSCOPY WITH PROPOFOL ;  Surgeon: Celestia Agent, MD;  Location: [facility];  Service: Endoscopy;  Laterality: N/A;   DILATION AND CURETTAGE OF UTERUS     ENDARTERECTOMY Right 08/10/2013   Procedure: Excision of Venous Aneurysm Right Neck;  Surgeon: Krystal JULIANNA Doing, MD;  [facility];  Service: Vascular;  Laterality: Right;   ESOPHAGOGASTRODUODENOSCOPY (EGD) WITH PROPOFOL  N/A 12/05/2017   Procedure: ESOPHAGOGASTRODUODENOSCOPY (EGD) WITH PROPOFOL ;  Surgeon: Celestia Agent, MD;  Location: [facility];  Service: Endoscopy;  Laterality: N/A;   filter Left 10/19/2007   IVC   FRACTURE SURGERY Left    left wrist   HIP SURGERY Left 10/18/2006   JOINT REPLACEMENT Right 11/19/2003   (knee)multiple surgeries   JOINT REPLACEMENT Left 12/16/2009   (knee)   JOINT REPLACEMENT Left 2011, 2012   Hip   KYPHOPLASTY N/A 03/03/2021  Procedure: Thoracic seven Kyphoplasty;  Surgeon: [name], MD;  Location: [facility] OR;  Service: Neurosurgery;  Laterality: N/A;   KYPHOPLASTY N/A 03/31/2021   Procedure: Thoracic seven Kyphoplasty;  Surgeon: [name], MD;  Location: [facility] OR;  Service: Neurosurgery;  Laterality: N/A;   LUMBAR LAMINECTOMY/ DECOMPRESSION WITH MET-RX Right 04/17/2021   Procedure: Right Thoracic Seven-Eight  Extraforaminal decompression/foraminotomy;  Surgeon: [name], MD;  Location: [facility] OR;  Service: Neurosurgery;  Laterality: Right;   MASS EXCISION  10/18/1972   uterus   QUADRICEPS TENDON REPAIR Right 12/06/2016    Procedure: QUAD TENDON RECONSTRUCTION;  Surgeon: Donnice Car, MD;  Location: [facility];  Service: Orthopedics;  Laterality: Right;  Requests for 90 mins   RADIOLOGY WITH ANESTHESIA N/A 01/20/2021   Procedure: MRI WITH [facility] PAIN WITHOUT CONTRAST;  Surgeon: Radiologist, Medication, MD;  Location: [facility] OR;  Service: Radiology;  Laterality: N/A;   RADIOLOGY WITH ANESTHESIA N/A 10/02/2021   Procedure: MRI WITH ANESTHESIA;  Surgeon: Radiologist, Medication, MD;  Location: [facility] OR;  Service: Radiology;  Laterality: N/A;   TONSILLECTOMY  10/18/1984   TOTAL HIP REVISION Left 12/07/2013   Procedure:  REVISION  CONSTRAINED LINER LEFT TOTAL HIP ;  Surgeon: Donnice JONETTA Car, MD;  Location: [facility];  Service: Orthopedics;  Laterality: Left;   TOTAL SHOULDER REPLACEMENT Right 11/30/2010   TUBAL LIGATION  10/19/1971    Family History  Problem Relation Age of Onset   Diabetes Mother    Heart disease Mother        before age 52 - ?thick blood - pt does not know if patient had formal heart issue but heart stopped   Hyperlipidemia Mother    Hypertension Mother    Diabetes Father    Hyperlipidemia Father    Hypertension Father    Heart attack Father        in his 16s during adm for flu   Peripheral vascular disease Father    Other Father        amputation   Diabetes Sister    Hyperlipidemia Sister    Hypertension Sister    Deep vein thrombosis Brother    Hyperlipidemia Brother    Hypertension Brother     Social History:  reports that she has never smoked. She has never used smokeless tobacco. She reports that she does not drink alcohol  and does not use drugs.  Allergies: Allergies[1]  Medications: I have reviewed the patient's current medications.  Results for orders placed or performed during the hospital encounter of 10/31/24 (from the past 48 hours)  CBC     Status: Abnormal   Collection Time: 10/31/24  8:25 PM  Result Value Ref Range   WBC 10.7 (H) 4.0 - 10.5 K/uL   RBC 3.97 3.87 - 5.11 MIL/uL    Hemoglobin 11.4 (L) 12.0 - 15.0 g/dL   HCT 64.3 (L) 63.9 - 53.9 %   MCV 89.7 80.0 - 100.0 fL   MCH 28.7 26.0 - 34.0 pg   MCHC 32.0 30.0 - 36.0 g/dL   RDW 84.0 (H) 88.4 - 84.4 %   Platelets 270 150 - 400 K/uL   nRBC 0.0 0.0 - 0.2 %    Comment: Performed at [facility], [address], [place], [place] [address]  Basic metabolic panel     Status: Abnormal   Collection Time: 10/31/24  8:25 PM  Result Value Ref Range   Sodium 133 (L) 135 - 145 mmol/L   Potassium 4.9 3.5 - 5.1 mmol/L  Chloride 92 (L) 98 - 111 mmol/L   CO2 27 22 - 32 mmol/L   Glucose, Bld 115 (H) 70 - 99 mg/dL    Comment: Glucose reference range applies only to samples taken after fasting for at least 8 hours.   BUN 35 (H) 8 - 23 mg/dL   Creatinine, Ser 8.27 (H) 0.44 - 1.00 mg/dL   Calcium  10.3 8.9 - 10.3 mg/dL   GFR, Estimated 30 (L) >60 mL/min    Comment: (NOTE) Calculated using the CKD-EPI Creatinine Equation (2021)    Anion gap 14 5 - 15    Comment: Performed at [facility], [address] [place], [place] [address]  Basic metabolic panel     Status: Abnormal   Collection Time: 11/01/24  4:29 AM  Result Value Ref Range   Sodium 133 (L) 135 - 145 mmol/L   Potassium 5.3 (H) 3.5 - 5.1 mmol/L   Chloride 95 (L) 98 - 111 mmol/L   CO2 29 22 - 32 mmol/L   Glucose, Bld 149 (H) 70 - 99 mg/dL    Comment: Glucose reference range applies only to samples taken after fasting for at least 8 hours.   BUN 34 (H) 8 - 23 mg/dL   Creatinine, Ser 8.47 (H) 0.44 - 1.00 mg/dL   Calcium  9.9 8.9 - 10.3 mg/dL   GFR, Estimated 35 (L) >60 mL/min    Comment: (NOTE) Calculated using the CKD-EPI Creatinine Equation (2021)    Anion gap 9 5 - 15    Comment: Performed at [facility], [address], [place], [place] [address]  CBC     Status: Abnormal   Collection Time: 11/01/24  4:29 AM  Result Value Ref Range   WBC 9.9 4.0 - 10.5 K/uL   RBC 3.70 (L) 3.87 - 5.11 MIL/uL   Hemoglobin 10.6 (L) 12.0 - 15.0 g/dL    HCT 66.8 (L) 63.9 - 46.0 %   MCV 89.5 80.0 - 100.0 fL   MCH 28.6 26.0 - 34.0 pg   MCHC 32.0 30.0 - 36.0 g/dL   RDW 84.0 (H) 88.4 - 84.4 %   Platelets 250 150 - 400 K/uL   nRBC 0.0 0.0 - 0.2 %    Comment: Performed at [facility], [address], [place], [place] [address]    DG FEMUR, [place] 2 VIEWS RIGHT Result Date: 11/01/2024 EXAM: 2 VIEW(S) XRAY OF THE RIGHT FEMUR 11/01/2024 07:12:28 AM COMPARISON: Right knee series dated 10/31/2024. CLINICAL HISTORY: Hip pain. FINDINGS: BONES AND JOINTS: The patient is again noted to be status post right total knee arthroplasty. There is no definite evidence of acute traumatic injury, including no acute fracture or malalignment. SOFT TISSUES: Unremarkable. IMPRESSION: 1. No definite evidence of acute traumatic injury. Status post right total knee arthroplasty. Electronically signed by: Evalene Coho MD 11/01/2024 07:34 AM EST RP Workstation: [id number]   CT Lumbar Spine Wo Contrast Result Date: 10/31/2024 CLINICAL DATA:  [place] EXAM: CT LUMBAR SPINE WITHOUT CONTRAST TECHNIQUE: Multidetector CT imaging of the lumbar spine was performed without intravenous contrast administration. Multiplanar CT image reconstructions were also generated. RADIATION DOSE REDUCTION: This exam was performed according to the departmental dose-optimization program which includes automated exposure control, adjustment of the mA and/or kV according to patient size and/or use of iterative reconstruction technique. COMPARISON:  10/01/2021 FINDINGS: Segmentation: 5 lumbar type vertebrae. Lumbarization of the S1 vertebral body is again noted. Alignment: There is a hyper extension injury with distraction across the  L5-S1 disc space measuring 2.6 cm. Otherwise alignment is anatomic. Vertebrae: There is a hyper extension injury with a transverse fracture through the bilateral facet joints at the L5-S1 level. Approximally 2.6 cm of distraction across the L5-S1 disc space. No other acute bony  abnormalities. Multilevel thoracolumbar spinal fusion is again identified, with intra corporal screws seen at the T9, T11, T12, L1, L2, and L3 levels. Bony fusion across the intervertebral discs from T9 through L1 as well as L2 through L5. Paraspinal and other soft tissues: There is paraspinal soft tissue swelling at the L5-S1 level. Atherosclerosis of the aorta and its distal branches. IVC filter is noted. Disc levels: No significant bony encroachment upon the central canal or neural foramina. Thoracolumbar posterior spinal fusion as described above. At the L5-S1 level at the site of hyperextension injury and transverse fracture through the posterior elements, there is circumferential disc bulge with a left paracentral disc protrusion. This results in severe central canal stenosis. Reconstructed images demonstrate no additional findings. IMPRESSION: 1. Hyperextension injury, with distraction of the L5-S1 disc space measuring 2.6 cm and Chance type fracture through the bilateral L5-S1 facets. Soft tissue edema coupled with a circumferential disc bulge and left paracentral disc protrusion results in severe central canal stenosis at this level. 2. Extensive thoracolumbar posterior spinal fusion as above. 3.  Aortic Atherosclerosis (ICD10-I70.0). Critical Value/emergent results were called by telephone at the time of interpretation on 10/31/2024 at 9:24 pm to provider [facility] , who verbally acknowledged these results. Electronically Signed   By: Ozell Daring M.D.   On: 10/31/2024 21:24   CT PELVIS WO CONTRAST Result Date: 10/31/2024 EXAM: CT PELVIS, WITHOUT IV CONTRAST 10/31/2024 08:55:05 PM TECHNIQUE: Axial images were acquired through the pelvis without IV contrast. Reformatted images were reviewed. Automated exposure control, iterative reconstruction, and/or weight based adjustment of the mA/kV was utilized to reduce the radiation dose to as low as reasonably achievable. COMPARISON: None available. CLINICAL  HISTORY: Recent fall. FINDINGS: BONES: Changes consistent with left hip replacement are noted. Prior bone graft harvest is noted in the left iliac bone adjacent to the sacrum. No sacral abnormality is seen. Degenerative and postoperative changes in the lower lumbar spine are seen. At L5-S1, there is considerable widening of the disc space with associated soft tissue edema. Numbering is similar to that seen on prior lumbar spine imaging. A fracture line is noted extending through posterior elements at L5-S1 in an area of prior fusion. The widening of the disc space anteriorly is approximately 2.8 cm. The maximum area of distraction in the fracture plane measures approximately 5 mm. This will be better evaluated on concomitant lumbar spine CT. JOINTS: Changes consistent with left hip replacement are noted. Degenerative changes of the right hip joint are noted. Some remodeling of the femoral head is seen. SOFT TISSUES: Aortic calcifications are noted. IVC filter is noted in satisfactory position. Soft tissue edema is associated with the L5-S1 disc space widening and fracture. INTRAPELVIC CONTENTS: Visualized bowel is within normal limits. The uterus has been surgically removed. No free fluid is seen. IMPRESSION: 1. Considerable widening of the L5-1 disc space with associated soft tissue edema and a fracture line extending through the posterior elements in an area of prior lumbar fusion; anterior widening of the disc space is approximately 2.8 cm with up to 5 mm distraction in the fracture plane, better evaluated on the concomitant lumbar spine CT. 2. Left hip arthroplasty changes. 3. Degenerative changes of the right hip joint with remodeling  of the femoral head. Electronically signed by: Oneil Devonshire MD 10/31/2024 09:10 PM EST RP Workstation: [id number]   CT Cervical Spine Wo Contrast Result Date: 10/31/2024 EXAM: CT CERVICAL SPINE WITHOUT CONTRAST 10/31/2024 08:55:05 PM TECHNIQUE: CT of the cervical spine was  performed without the administration of intravenous contrast. Multiplanar reformatted images are provided for review. Automated exposure control, iterative reconstruction, and/or weight based adjustment of the mA/kV was utilized to reduce the radiation dose to as low as reasonably achievable. COMPARISON: 05/30/2023 CLINICAL HISTORY: Neck trauma (Age >= 65y). FINDINGS: BONES AND ALIGNMENT: Reversal of the normal cervical lordosis, similar to prior. Trace degenerative retrolisthesis of C2 on C3, C5 on C6, and C6 on C7. Trace degenerative anterolisthesis of C7 on T1, T1 on T2, and T2 on T3. Anterior cervical fusion hardware at C3-C4. Solid osseous fusion from the C3 to C5 levels. No acute fracture or traumatic malalignment. DEGENERATIVE CHANGES: Severe disc space narrowing at C5-C6 and C6-C7. Osteophyte complexes at multiple levels. There is at least mild osseous spinal canal stenosis at C5-C6 and C6-C7. Facet arthrosis and uncovertebral hypertrophy at multiple levels. There is severe foraminal stenosis at C5-C6 and C6-C7. SOFT TISSUES: No prevertebral soft tissue swelling. IMPRESSION: 1. No acute traumatic injury of the cervical spine. 2. Degenerative changes as above. Electronically signed by: Donnice Mania MD 10/31/2024 09:09 PM EST RP Workstation: [id number]   CT Head Wo Contrast Result Date: 10/31/2024 EXAM: CT HEAD WITHOUT CONTRAST 10/31/2024 08:55:05 PM TECHNIQUE: CT of the head was performed without the administration of intravenous contrast. Automated exposure control, iterative reconstruction, and/or weight based adjustment of the mA/kV was utilized to reduce the radiation dose to as low as reasonably achievable. COMPARISON: 05/30/2023 CLINICAL HISTORY: Head trauma, minor (Age >= 65y) FINDINGS: BRAIN AND VENTRICLES: No acute hemorrhage. No evidence of acute infarct. No hydrocephalus. No extra-axial collection. No mass effect or midline shift. Mild chronic microvascular ischemic changes. Atherosclerosis of  the carotid siphons. ORBITS: Left lens replacement. SINUSES: No acute abnormality. SOFT TISSUES AND SKULL: There is a partially visualized rounded soft tissue lesion along the inferior aspect of the right ear which appears to be measuring at least 1.5 cm (series 3, image 1). This finding is likely present on prior studies but appears significantly increased in size. Recommend correlation with physical exam findings. No skull fracture. IMPRESSION: 1. No acute intracranial abnormality. 2. Partially visualized rounded soft tissue lesion along the inferior aspect of the right ear measuring at least 1.5 cm, likely present on prior studies but significantly increased in size. Recommend correlation with findings on physical examination. Electronically signed by: Donnice Mania MD 10/31/2024 09:03 PM EST RP Workstation: [id number]   DG Knee 2 Views Right Result Date: 10/31/2024 CLINICAL DATA:  Unwitnessed fall EXAM: DG KNEE 1-2V*R* COMPARISON:  12/12/2006 FINDINGS: Interval revision of the knee arthroplasty compared to remote exam from 2008. No definitive fracture is seen. Dystrophic appearing calcifications within the anterior infrapatellar soft tissues. No sizable knee effusion IMPRESSION: Knee arthroplasty without definitive fracture on limited two views. Electronically Signed   By: Luke Bun M.D.   On: 10/31/2024 20:15   DG Chest 2 View Result Date: 10/31/2024 EXAM: 2 VIEW(S) XRAY OF THE CHEST 10/31/2024 08:08:00 PM COMPARISON: None available. CLINICAL HISTORY: fall fall fall fall fall FINDINGS: LUNGS AND PLEURA: Low lung volumes. Left basilar atelectasis. No pleural effusion. No pneumothorax. HEART AND MEDIASTINUM: Aortic arch calcifications. No acute abnormality of the cardiac and mediastinal silhouettes. BONES AND SOFT TISSUES: Thoracic spine surgical hardware noted. Right  shoulder arthroplasty noted. Cervical spine surgical hardware noted. IMPRESSION: 1. No acute cardiopulmonary abnormality identified. 2.  Low lung volumes with left basilar atelectasis. Electronically signed by: Dorethia Molt MD 10/31/2024 08:13 PM EST RP Workstation: [id number]    Review of Systems  Constitutional:  Positive for activity change.  Musculoskeletal:  Positive for back pain and gait problem.  Neurological:  Positive for weakness and numbness.   Blood pressure (!) 145/74, pulse 92, temperature (!) 97.3 F (36.3 C), resp. rate 18, height 5' 4 (1.626 m), weight 124.3 kg, SpO2 94%. Physical Exam Constitutional:      Appearance: Normal appearance. She is obese.  HENT:     Head: Normocephalic and atraumatic.     Right Ear: Tympanic membrane, ear canal and external ear normal.     Left Ear: Tympanic membrane, ear canal and external ear normal.     Nose: Nose normal.     Mouth/Throat:     Mouth: Mucous membranes are dry.     Pharynx: Oropharynx is clear.  Eyes:     Extraocular Movements: Extraocular movements intact.     Conjunctiva/sclera: Conjunctivae normal.     Pupils: Pupils are equal, round, and reactive to light.  Cardiovascular:     Rate and Rhythm: Normal rate and regular rhythm.     Pulses: Normal pulses.     Heart sounds: Normal heart sounds.  Pulmonary:     Effort: Pulmonary effort is normal.     Breath sounds: Normal breath sounds.  Abdominal:     General: Abdomen is flat. Bowel sounds are normal.     Palpations: Abdomen is soft.  Musculoskeletal:     Cervical back: Normal range of motion.  Skin:    General: Skin is warm and dry.     Capillary Refill: Capillary refill takes less than 2 seconds.  Neurological:     Mental Status: She is alert.     Comments: Mild weakness in iliopsoas and quadriceps graded 4 minus out of 5 bilaterally tibialis anterior and gastrocs also graded at 4 minus out of 5.  Absent reflexes in the patella and Achilles.  Psychiatric:        Mood and Affect: Mood normal.        Behavior: Behavior normal.        Thought Content: Thought content normal.         Judgment: Judgment normal.     Assessment/Plan: Fracture dislocation of L4-L5 with instability.  Plan: Posterior stabilization of fracture dislocation of L4-L5.  Victory PARAS Tanga Gloor 11/01/2024, 3:54 PM         [1]  Allergies Allergen Reactions   Celecoxib  Rash

## 2024-11-01 NOTE — Progress Notes (Signed)
 Called to get report on patient coming down for procedure. No answer. Transport will be sent for patient.

## 2024-11-01 NOTE — Plan of Care (Signed)
  Problem: Education: Goal: Knowledge of General Education information will improve Description: Including pain rating scale, medication(s)/side effects and non-pharmacologic comfort measures Outcome: Progressing   Problem: Health Behavior/Discharge Planning: Goal: Ability to manage health-related needs will improve Outcome: Progressing   Problem: Clinical Measurements: Goal: Diagnostic test results will improve Outcome: Progressing   Problem: Activity: Goal: Risk for activity intolerance will decrease Outcome: Progressing   Problem: Nutrition: Goal: Adequate nutrition will be maintained Outcome: Progressing   Problem: Pain Managment: Goal: General experience of comfort will improve and/or be controlled Outcome: Progressing   Problem: Safety: Goal: Ability to remain free from injury will improve Outcome: Progressing

## 2024-11-01 NOTE — ED Notes (Signed)
 MD at bedside.

## 2024-11-01 NOTE — Anesthesia Procedure Notes (Signed)
 Central Venous Catheter Insertion Performed by: Leonce Athens, MD, anesthesiologist Start/End1/15/2026 5:04 AM, 11/01/2024 5:17 PM Patient location: Pre-op. Preanesthetic checklist: patient identified, IV checked, site marked, risks and benefits discussed, surgical consent, monitors and equipment checked, pre-op evaluation, timeout performed and anesthesia consent Position: supine Lidocaine  1% used for infiltration and patient sedated Hand hygiene performed  and maximum sterile barriers used  Catheter size: 8 Fr Total catheter length 16. Central line was placed.Double lumen Procedure performed using ultrasound to evaluate access site. Ultrasound Notes:relevant anatomy identified, ultrasound used to visualize needle entry, vessel patent under ultrasound and image(s) printed for medical record. Attempts: 1 Following insertion, dressing applied and line sutured. Post procedure assessment: blood return through all ports, free fluid flow and no air  Patient tolerated the procedure well with no immediate complications. Additional procedure comments: CVP: Timeout, sterile prep, drape, FBP R neck.  Supine position.  1% lido local, finder and trocar LIJ 1st pass with US  guidance.  2 lumen placed over J wire. Biopatch and sterile dressing on.  Patient tolerated well.  VSS.  [name] Leonce, MD.

## 2024-11-01 NOTE — Anesthesia Procedure Notes (Signed)
 Procedure Name: Intubation Date/Time: 11/01/2024 5:50 PM  Performed by: Zelphia Norleen HERO, CRNAPre-anesthesia Checklist: Patient identified, Emergency Drugs available, Suction available and Patient being monitored Patient Re-evaluated:Patient Re-evaluated prior to induction Oxygen Delivery Method: Circle system utilized Preoxygenation: Pre-oxygenation with 100% oxygen Induction Type: IV induction Ventilation: Mask ventilation without difficulty Laryngoscope Size: Mac and 3 Grade View: Grade I Tube type: Oral Tube size: 7.0 mm Number of attempts: 1 Airway Equipment and Method: Stylet Placement Confirmation: ETT inserted through vocal cords under direct vision, positive ETCO2 and breath sounds checked- equal and bilateral Secured at: 21 cm Tube secured with: Tape Dental Injury: Teeth and Oropharynx as per pre-operative assessment

## 2024-11-01 NOTE — Op Note (Signed)
 Date of surgery: 11/01/2024 Preoperative diagnosis: Fracture dislocation L4-L5 status post fusion Postoperative diagnosis: Same Procedure: Open reduction and posterior fixation of L4-L5 fracture dislocation with pedicle screw fixation L4-L5 and allograft arthrodesis.  Image-guidance assisted pedicle screws.  Fluoroscopy. Surgeon: Victory Gens Anesthesia: General endotracheal Indications: [name] is a 79 year old lady who many years ago underwent a posterior decompression and fusion from L4 to the sacrum.  She has had progressive spondylitic disease that required surgical decompression and stabilization of her spine all the way up to the level of T7.  Her previous hardware in the lower lumbar spine had been removed sometime ago.  She sustained a ground-level fall where she fractured and dislocated L4 and L5.  She is taken to the operating room at this time to undergo surgical stabilization of this level.  Procedure: The patient was brought to the operating room supine on the stretcher.  After the smooth induction of general endotracheal anesthesia she was carefully placed prone onto a Jackson table.  The back was prepped with alcohol  DuraPrep and draped in a sterile fashion and a reference pin was placed in the posterior suprailiac crest on the left side.  Reference arc was placed onto the post and then we obtained an O-arm spin for image guidance.  With the O-arm then we open the midline incision and carried down to the lumbodorsal fascia.  The area of the fracture could be visualized with hemorrhagic and ecchymotic tissue at this fracture site.  The dissection was carried out laterally.  We could identify pedicle entry sites for the screws at L4.  These were isolated.  The pedicle entry sites for the L5 screws were much more difficult to locate and was felt that new screws would be better placed from a lateral projection percutaneously using the O-arm guidance.  This was performed with the O-arm and  7.5 x 45 mm screws were placed into L5.  The area of the fracture was then explored on either side and on the patient's left side we were able to drill down into the cavity of the distracted space between the inferior margin of the body of L4 and the superior margin of body of L5 which measured upwards of [place].  Next we placed a 4.5 x 45 mm screws in the pedicle entry sites of L4.  The screws were noted to be tight in the bone and the bone felt quite stable.  Then the system was connected with a 5.5 x 45 mm precontoured's rod on the right side and a 5.5 x 55 mm precontoured rod on the left side.  System was provisionally tightened and fluoroscopic imaging was used to confirm presence of the rods with good purchase on either screw.  Then the system was torqued into its final position further bone was packed into the lateral gutters over the fracture sites.  Once this was completed the area was inspected for hemostasis and when verified the lumbodorsal fascia was closed with #1 Vicryl in interrupted fashion 2-0 Vicryl was used in the subcutaneous tissues 3-0 Vicryl to subcuticular skin and some 4-0 Vicryl also subcuticularly Dermabond was placed on the skin blood loss for the procedure was estimated at 100 cc.

## 2024-11-01 NOTE — Progress Notes (Signed)
 Patient ID: [name], female   DOB: March 24, 1946, 79 y.o.   MRN: [id number] Postoperatively the patient should be maintained at bedrest and logrolled as needed for comfort she can lie on her sides.  She is free to move her lower extremities and range them as best possible.  She will need to be at bedrest for the next 5 days.

## 2024-11-01 NOTE — TOC CAGE-AID Note (Signed)
 Transition of Care [facility]) - CAGE-AID Screening   Patient Details  Name: [name] MRN: [id number] Date of Birth: [birth year]  Transition of Care [facility]) CM/SW Contact:    Raveen Wieseler E Sahithi Ordoyne, LCSW Phone Number: 11/01/2024, 3:43 PM   Clinical Narrative: No SA noted.   CAGE-AID Screening:    Have You Ever Felt You Ought to Cut Down on Your Drinking or Drug Use?: No Have People Annoyed You By Critizing Your Drinking Or Drug Use?: No Have You Felt Bad Or Guilty About Your Drinking Or Drug Use?: No Have You Ever Had a Drink or Used Drugs First Thing In The Morning to Steady Your Nerves or to Get Rid of a Hangover?: No CAGE-AID Score: 0  Substance Abuse Education Offered: No

## 2024-11-01 NOTE — Progress Notes (Signed)
 [facility] PROGRESS NOTE    Progress Note  [name]  FMW:[id number] DOB: Oct 06, 1946 DOA: 10/31/2024 PCP: Onita Rush, MD     Brief Narrative:   [name] is an 79 y.o. female past medical history significant for PE DVT on Xarelto , history of an IVC filter, multiple back surgeries mild aortic stenosis essential hypertension morbid obesity presents today after mechanical fall CT of the lumbar spine showed Chance fracture of old L4-L5 fusion with significant distraction.  Neurosurgery was consulted who recommended bedrest and a 30 degree angle for pain control and rehab.  They will see in the morning as she may likely need a repeat of [name] lumbar instrumentation   Assessment/Plan:   Acute Closed lumbar vertebral fracture (HCC) Continue pain control and bowel regimen. Awaiting neurosurgery further recommendations.  She received a single dose of Decadron  in the ED further steroids per neurosurgery. Right leg externally rotated and shorter than the left geta left hip x-ray Continue to hold anticoagulation. Make [name] n.p.o. sips with meds.  History of PE/DVT and an IVC filter in place: Last dose of Xarelto  was on 10/30/2024 holding for possible surgical intervention.  Morbid obesity: Hold GLP-1 inhibitor.  Acute kidney injury on chronic kidney disease, stage III (moderate) (HCC) Started on IV fluids as [name] creatinine on admission was 1.7, this morning 1.2. Avoid NSAIDs continue IV fluids for an additional 24 hours recheck basic metabolic panel in the morning.  Mild aortic stenosis: Noted.  Hypothyroidism: Continue Synthroid .  Lymphedema of both legs: Likely due to IVC filter.  DVT prophylaxis: lovenox  Family Communication:none Status is: Inpatient Remains inpatient appropriate because: Acute closed fracture of the lumbar vertebral    Code Status:     Code Status Orders  (From admission, onward)           Start     Ordered   10/31/24 2340  Full  code  Continuous       Question:  By:  Answer:  Consent: discussion documented in EHR   10/31/24 2340           Code Status History     Date Active Date Inactive Code Status Order ID Comments User Context   10/01/2021 1940 10/10/2021 0106 Full Code [id number]  [name], NP ED   04/17/2021 1218 04/17/2021 2353 Full Code [id number]  [name], MD Inpatient   03/31/2021 1507 04/01/2021 2120 Full Code [id number]  [name], MD Inpatient   03/03/2021 1949 03/04/2021 0341 Full Code [id number]  [name], MD Inpatient   08/18/2018 2202 08/19/2018 2127 Full Code [id number]  [name], MD Inpatient   12/06/2016 1107 12/07/2016 1910 Full Code [id number]  [name] Inpatient   12/07/2013 1836 12/09/2013 1659 Full Code [id number]  Babish, [name], PA-C Inpatient         IV Access:   Peripheral IV   Procedures and diagnostic studies:   CT Lumbar Spine Wo Contrast Result Date: 10/31/2024 CLINICAL DATA:  Clemens EXAM: CT LUMBAR SPINE WITHOUT CONTRAST TECHNIQUE: Multidetector CT imaging of the lumbar spine was performed without intravenous contrast administration. Multiplanar CT image reconstructions were also generated. RADIATION DOSE REDUCTION: This exam was performed according to the departmental dose-optimization program which includes automated exposure control, adjustment of the mA and/or kV according to patient size and/or use of iterative reconstruction technique. COMPARISON:  10/01/2021 FINDINGS: Segmentation: 5 lumbar type vertebrae. Lumbarization of the S1 vertebral body is again noted. Alignment: There is a hyper extension injury with  distraction across the L5-S1 disc space measuring 2.6 cm. Otherwise alignment is anatomic. Vertebrae: There is a hyper extension injury with a transverse fracture through the bilateral facet joints at the L5-S1 level. Approximally 2.6 cm of distraction across the L5-S1 disc space. No other acute bony abnormalities. Multilevel  thoracolumbar spinal fusion is again identified, with intra corporal screws seen at the T9, T11, T12, L1, L2, and L3 levels. Bony fusion across the intervertebral discs from T9 through L1 as well as L2 through L5. Paraspinal and other soft tissues: There is paraspinal soft tissue swelling at the L5-S1 level. Atherosclerosis of the aorta and its distal branches. IVC filter is noted. Disc levels: No significant bony encroachment upon the central canal or neural foramina. Thoracolumbar posterior spinal fusion as described above. At the L5-S1 level at the site of hyperextension injury and transverse fracture through the posterior elements, there is circumferential disc bulge with a left paracentral disc protrusion. This results in severe central canal stenosis. Reconstructed images demonstrate no additional findings. IMPRESSION: 1. Hyperextension injury, with distraction of the L5-S1 disc space measuring 2.6 cm and Chance type fracture through the bilateral L5-S1 facets. Soft tissue edema coupled with a circumferential disc bulge and left paracentral disc protrusion results in severe central canal stenosis at this level. 2. Extensive thoracolumbar posterior spinal fusion as above. 3.  Aortic Atherosclerosis (ICD10-I70.0). Critical Value/emergent results were called by telephone at the time of interpretation on 10/31/2024 at 9:24 pm to provider [facility] , who verbally acknowledged these results. Electronically Signed   By: Ozell Daring M.D.   On: 10/31/2024 21:24   CT PELVIS WO CONTRAST Result Date: 10/31/2024 EXAM: CT PELVIS, WITHOUT IV CONTRAST 10/31/2024 08:55:05 PM TECHNIQUE: Axial images were acquired through the pelvis without IV contrast. Reformatted images were reviewed. Automated exposure control, iterative reconstruction, and/or weight based adjustment of the mA/kV was utilized to reduce the radiation dose to as low as reasonably achievable. COMPARISON: None available. CLINICAL HISTORY: Recent fall.  FINDINGS: BONES: Changes consistent with left hip replacement are noted. Prior bone graft harvest is noted in the left iliac bone adjacent to the sacrum. No sacral abnormality is seen. Degenerative and postoperative changes in the lower lumbar spine are seen. At L5-S1, there is considerable widening of the disc space with associated soft tissue edema. Numbering is similar to that seen on prior lumbar spine imaging. A fracture line is noted extending through posterior elements at L5-S1 in an area of prior fusion. The widening of the disc space anteriorly is approximately 2.8 cm. The maximum area of distraction in the fracture plane measures approximately 5 mm. This will be better evaluated on concomitant lumbar spine CT. JOINTS: Changes consistent with left hip replacement are noted. Degenerative changes of the right hip joint are noted. Some remodeling of the femoral head is seen. SOFT TISSUES: Aortic calcifications are noted. IVC filter is noted in satisfactory position. Soft tissue edema is associated with the L5-S1 disc space widening and fracture. INTRAPELVIC CONTENTS: Visualized bowel is within normal limits. The uterus has been surgically removed. No free fluid is seen. IMPRESSION: 1. Considerable widening of the L5-1 disc space with associated soft tissue edema and a fracture line extending through the posterior elements in an area of prior lumbar fusion; anterior widening of the disc space is approximately 2.8 cm with up to 5 mm distraction in the fracture plane, better evaluated on the concomitant lumbar spine CT. 2. Left hip arthroplasty changes. 3. Degenerative changes of the right hip  joint with remodeling of the femoral head. Electronically signed by: Oneil Devonshire MD 10/31/2024 09:10 PM EST RP Workstation: [id number]   CT Cervical Spine Wo Contrast Result Date: 10/31/2024 EXAM: CT CERVICAL SPINE WITHOUT CONTRAST 10/31/2024 08:55:05 PM TECHNIQUE: CT of the cervical spine was performed without the  administration of intravenous contrast. Multiplanar reformatted images are provided for review. Automated exposure control, iterative reconstruction, and/or weight based adjustment of the mA/kV was utilized to reduce the radiation dose to as low as reasonably achievable. COMPARISON: 05/30/2023 CLINICAL HISTORY: Neck trauma (Age >= 65y). FINDINGS: BONES AND ALIGNMENT: Reversal of the normal cervical lordosis, similar to prior. Trace degenerative retrolisthesis of C2 on C3, C5 on C6, and C6 on C7. Trace degenerative anterolisthesis of C7 on T1, T1 on T2, and T2 on T3. Anterior cervical fusion hardware at C3-C4. Solid osseous fusion from the C3 to C5 levels. No acute fracture or traumatic malalignment. DEGENERATIVE CHANGES: Severe disc space narrowing at C5-C6 and C6-C7. Osteophyte complexes at multiple levels. There is at least mild osseous spinal canal stenosis at C5-C6 and C6-C7. Facet arthrosis and uncovertebral hypertrophy at multiple levels. There is severe foraminal stenosis at C5-C6 and C6-C7. SOFT TISSUES: No prevertebral soft tissue swelling. IMPRESSION: 1. No acute traumatic injury of the cervical spine. 2. Degenerative changes as above. Electronically signed by: Donnice Mania MD 10/31/2024 09:09 PM EST RP Workstation: [id number]   CT Head Wo Contrast Result Date: 10/31/2024 EXAM: CT HEAD WITHOUT CONTRAST 10/31/2024 08:55:05 PM TECHNIQUE: CT of the head was performed without the administration of intravenous contrast. Automated exposure control, iterative reconstruction, and/or weight based adjustment of the mA/kV was utilized to reduce the radiation dose to as low as reasonably achievable. COMPARISON: 05/30/2023 CLINICAL HISTORY: Head trauma, minor (Age >= 65y) FINDINGS: BRAIN AND VENTRICLES: No acute hemorrhage. No evidence of acute infarct. No hydrocephalus. No extra-axial collection. No mass effect or midline shift. Mild chronic microvascular ischemic changes. Atherosclerosis of the carotid siphons.  ORBITS: Left lens replacement. SINUSES: No acute abnormality. SOFT TISSUES AND SKULL: There is a partially visualized rounded soft tissue lesion along the inferior aspect of the right ear which appears to be measuring at least 1.5 cm (series 3, image 1). This finding is likely present on prior studies but appears significantly increased in size. Recommend correlation with physical exam findings. No skull fracture. IMPRESSION: 1. No acute intracranial abnormality. 2. Partially visualized rounded soft tissue lesion along the inferior aspect of the right ear measuring at least 1.5 cm, likely present on prior studies but significantly increased in size. Recommend correlation with findings on physical examination. Electronically signed by: Donnice Mania MD 10/31/2024 09:03 PM EST RP Workstation: [id number]   DG Knee 2 Views Right Result Date: 10/31/2024 CLINICAL DATA:  Unwitnessed fall EXAM: DG KNEE 1-2V*R* COMPARISON:  12/12/2006 FINDINGS: Interval revision of the knee arthroplasty compared to remote exam from 2008. No definitive fracture is seen. Dystrophic appearing calcifications within the anterior infrapatellar soft tissues. No sizable knee effusion IMPRESSION: Knee arthroplasty without definitive fracture on limited two views. Electronically Signed   By: Luke Bun M.D.   On: 10/31/2024 20:15   DG Chest 2 View Result Date: 10/31/2024 EXAM: 2 VIEW(S) XRAY OF THE CHEST 10/31/2024 08:08:00 PM COMPARISON: None available. CLINICAL HISTORY: fall fall fall fall fall FINDINGS: LUNGS AND PLEURA: Low lung volumes. Left basilar atelectasis. No pleural effusion. No pneumothorax. HEART AND MEDIASTINUM: Aortic arch calcifications. No acute abnormality of the cardiac and mediastinal silhouettes. BONES AND SOFT TISSUES: Thoracic spine surgical  hardware noted. Right shoulder arthroplasty noted. Cervical spine surgical hardware noted. IMPRESSION: 1. No acute cardiopulmonary abnormality identified. 2. Low lung volumes with  left basilar atelectasis. Electronically signed by: Dorethia Molt MD 10/31/2024 08:13 PM EST RP Workstation: [id number]     Medical Consultants:   None.   Subjective:    [name] she relates [name] pain is not controlled.  Objective:    Vitals:   10/31/24 2146 10/31/24 2228 10/31/24 2354 11/01/24 0410  BP: (!) 119/44 (!) 144/85 (!) 144/80 (!) 162/55  Pulse: (!) 59 64 66 83  Resp: 18 18 18 16   Temp: 98.1 F (36.7 C) 98 F (36.7 C) 98.2 F (36.8 C) 98.1 F (36.7 C)  TempSrc: Oral Oral Oral Temporal  SpO2: 94% 100% 100% 96%  Weight:      Height:       SpO2: 96 %  No intake or output data in the 24 hours ending 11/01/24 0614 Filed Weights   10/31/24 1832  Weight: 124.3 kg    Exam: General exam: In no acute distress. Respiratory system: Good air movement and clear to auscultation. Cardiovascular system: S1 & S2 heard, RRR. No JVD. Gastrointestinal system: Abdomen is nondistended, soft and nontender.  Extremities: [name] right leg externally rotated and shorter than the left Skin: No rashes, lesions or ulcers Psychiatry: Judgement and insight appear normal. Mood & affect appropriate.    Data Reviewed:    Labs: Basic Metabolic Panel: Recent Labs  Lab 10/31/24 2025 11/01/24 0429  NA 133* 133*  K 4.9 5.3*  CL 92* 95*  CO2 27 29  GLUCOSE 115* 149*  BUN 35* 34*  CREATININE 1.72* 1.52*  CALCIUM  10.3 9.9   GFR Estimated Creatinine Clearance: 39.7 mL/min (A) (by C-G formula based on SCr of 1.52 mg/dL (H)). Liver Function Tests: No results for input(s): AST, ALT, ALKPHOS, BILITOT, PROT, ALBUMIN in the last 168 hours. No results for input(s): LIPASE, AMYLASE in the last 168 hours. No results for input(s): AMMONIA in the last 168 hours. Coagulation profile No results for input(s): INR, PROTIME in the last 168 hours. COVID-19 Labs  No results for input(s): DDIMER, FERRITIN, LDH, CRP in the last 72 hours.  Lab Results   Component Value Date   SARSCOV2NAA NEGATIVE 10/01/2021   SARSCOV2NAA POSITIVE (A) 04/16/2021   SARSCOV2NAA NEGATIVE 03/30/2021   SARSCOV2NAA NEGATIVE 03/02/2021    CBC: Recent Labs  Lab 10/31/24 2025 11/01/24 0429  WBC 10.7* 9.9  HGB 11.4* 10.6*  HCT 35.6* 33.1*  MCV 89.7 89.5  PLT 270 250   Cardiac Enzymes: No results for input(s): CKTOTAL, CKMB, CKMBINDEX, TROPONINI in the last 168 hours. BNP (last 3 results) Recent Labs    07/02/24 1152  PROBNP 331   CBG: No results for input(s): GLUCAP in the last 168 hours. D-Dimer: No results for input(s): DDIMER in the last 72 hours. Hgb A1c: No results for input(s): HGBA1C in the last 72 hours. Lipid Profile: No results for input(s): CHOL, HDL, LDLCALC, TRIG, CHOLHDL, LDLDIRECT in the last 72 hours. Thyroid  function studies: No results for input(s): TSH, T4TOTAL, T3FREE, THYROIDAB in the last 72 hours.  Invalid input(s): FREET3 Anemia work up: No results for input(s): VITAMINB12, FOLATE, FERRITIN, TIBC, IRON, RETICCTPCT in the last 72 hours. Sepsis Labs: Recent Labs  Lab 10/31/24 2025 11/01/24 0429  WBC 10.7* 9.9   Microbiology No results found for this or any previous visit (from the past 240 hours).   Medications:    ALPRAZolam   0.5  mg Oral BID   atenolol   25 mg Oral Daily   escitalopram   5 mg Oral QHS   famotidine   40 mg Oral QHS   gabapentin   600 mg Oral TID   [START ON 11/02/2024] levothyroxine   25 mcg Oral Q M,W,F   lidocaine   1 patch Transdermal Once   pantoprazole   40 mg Oral Daily   Probiotic-Prebiotic   Oral q AM   sodium chloride  flush  3 mL Intravenous Q12H   Continuous Infusions:  sodium chloride         LOS: 1 day   Erle Odell Castor  [facility]  11/01/2024, 6:14 AM

## 2024-11-01 NOTE — Anesthesia Procedure Notes (Signed)
 Arterial Line Insertion Start/End1/15/2026 4:30 PM, 11/01/2024 4:40 PM Performed by: Zelphia Norleen HERO, CRNA, CRNA  Patient location: Pre-op. Preanesthetic checklist: patient identified, IV checked, site marked, risks and benefits discussed, surgical consent, monitors and equipment checked, pre-op evaluation, timeout performed and anesthesia consent Lidocaine  1% used for infiltration Right, radial was placed Catheter size: 20 G Hand hygiene performed  and maximum sterile barriers used   Attempts: 1 Ultrasound Notes:relevant anatomy identified, ultrasound used to visualize needle entry and vessel patent under ultrasound. Following insertion, dressing applied. Post procedure assessment: normal and unchanged  Patient tolerated the procedure well with no immediate complications.

## 2024-11-01 NOTE — Transfer of Care (Signed)
 Immediate Anesthesia Transfer of Care Note  Patient: [name]  Procedure(s) Performed: LAMINECTOMY WITH POSTERIOR LATERAL ARTHRODESIS LEVEL 1 (Back) APPLICATION OF O-ARM  Patient Location: PACU  Anesthesia Type:General  Level of Consciousness: drowsy and patient cooperative  Airway & Oxygen Therapy: Patient Spontanous Breathing and Patient connected to face mask oxygen  Post-op Assessment: Report given to RN, Post -op Vital signs reviewed and stable, Patient moving all extremities, and Patient moving all extremities X 4  Post vital signs: Reviewed and stable  Last Vitals:  Vitals Value Taken Time  BP 185/82 11/01/24 21:15  Temp    Pulse 89 11/01/24 21:20  Resp 16 11/01/24 21:20  SpO2 97 % 11/01/24 21:20  Vitals shown include unfiled device data.  Last Pain:  Vitals:   11/01/24 1605  TempSrc: Oral  PainSc: 6          Complications: No notable events documented.

## 2024-11-01 NOTE — Anesthesia Preprocedure Evaluation (Addendum)
"                                    Anesthesia Evaluation  Patient identified by MRN, date of birth, ID band Patient awake    Reviewed: Allergy & Precautions, NPO status , Patient's Chart, lab work & pertinent test results, reviewed documented beta blocker date and time   History of Anesthesia Complications (+) PONV  Airway Mallampati: II  TM Distance: >3 FB Neck ROM: Full    Dental  (+) Dental Advisory Given   Pulmonary COPD,  COPD inhaler, PE (IVC filter in place and is on Xarelto .  Her last dose of Xarelto  was 1/13)   breath sounds clear to auscultation       Cardiovascular hypertension, Pt. on medications and Pt. on home beta blockers + CAD (no significant stenosis by CT) and + DVT  + Valvular Problems/Murmurs AS  Rhythm:Regular Rate:Normal + Systolic murmurs 89/7974 ECHO: EF 60 to 65%.  !. LV EF 63 %. The left ventricle has normal function, no regional wall motion  abnormalities. Left ventricular diastolic parameters were normal. The average left ventricular global longitudinal strain is -25.9 %. The global longitudinal strain is normal.   2. RVF is normal. The right ventricular size is normal.   3. The mitral valve is degenerative. Mild mitral valve regurgitation. No evidence of mitral stenosis.   4. The aortic valve is tricuspid. There is mild calcification of the aortic valve. Aortic valve regurgitation is not visualized. Mild aortic valve stenosis, mean gradient 14 mmHg.       Neuro/Psych   Anxiety     Fell, fractured hardware in her back    GI/Hepatic Neg liver ROS, hiatal hernia,GERD  Medicated and Controlled,,  Endo/Other  Hypothyroidism  Class 4 obesityZepbound BMI 47  Renal/GU Renal InsufficiencyRenal disease     Musculoskeletal  (+) Arthritis ,    Abdominal   Peds  Hematology Xarelto : last dose1/13/2026 afternoon Hb 10.6, plt 250k   Anesthesia Other Findings   Reproductive/Obstetrics                               Anesthesia Physical Anesthesia Plan  ASA: 4  Anesthesia Plan: General   Post-op Pain Management: Tylenol  PO (pre-op)*   Induction: Intravenous  PONV Risk Score and Plan: 4 or greater and Ondansetron , Dexamethasone  and Treatment may vary due to age or medical condition  Airway Management Planned: Oral ETT  Additional Equipment: Arterial line, Ultrasound Guidance Line Placement and CVP  Intra-op Plan:   Post-operative Plan: Extubation in OR  Informed Consent: I have reviewed the patients History and Physical, chart, labs and discussed the procedure including the risks, benefits and alternatives for the proposed anesthesia with the patient or authorized representative who has indicated his/her understanding and acceptance.     Dental advisory given  Plan Discussed with: CRNA and Surgeon  Anesthesia Plan Comments: (Discussed with pt and her daughter)         Anesthesia Quick Evaluation  "

## 2024-11-01 NOTE — ED Notes (Signed)
 Pt to xray

## 2024-11-01 NOTE — ED Notes (Signed)
 MD at [facility]

## 2024-11-02 ENCOUNTER — Encounter (HOSPITAL_COMMUNITY): Payer: Self-pay | Admitting: Neurological Surgery

## 2024-11-02 DIAGNOSIS — I350 Nonrheumatic aortic (valve) stenosis: Secondary | ICD-10-CM | POA: Diagnosis not present

## 2024-11-02 DIAGNOSIS — S32059A Unspecified fracture of fifth lumbar vertebra, initial encounter for closed fracture: Secondary | ICD-10-CM | POA: Diagnosis not present

## 2024-11-02 DIAGNOSIS — N1831 Chronic kidney disease, stage 3a: Secondary | ICD-10-CM | POA: Diagnosis not present

## 2024-11-02 LAB — BASIC METABOLIC PANEL WITH GFR
Anion gap: 9 (ref 5–15)
BUN: 35 mg/dL — ABNORMAL HIGH (ref 8–23)
CO2: 26 mmol/L (ref 22–32)
Calcium: 9.8 mg/dL (ref 8.9–10.3)
Chloride: 98 mmol/L (ref 98–111)
Creatinine, Ser: 1.3 mg/dL — ABNORMAL HIGH (ref 0.44–1.00)
GFR, Estimated: 42 mL/min — ABNORMAL LOW
Glucose, Bld: 145 mg/dL — ABNORMAL HIGH (ref 70–99)
Potassium: 5.2 mmol/L — ABNORMAL HIGH (ref 3.5–5.1)
Sodium: 133 mmol/L — ABNORMAL LOW (ref 135–145)

## 2024-11-02 MED ORDER — KETOROLAC TROMETHAMINE 15 MG/ML IJ SOLN
15.0000 mg | Freq: Four times a day (QID) | INTRAMUSCULAR | Status: AC | PRN
  Administered 2024-11-02 – 2024-11-07 (×13): 15 mg via INTRAVENOUS
  Filled 2024-11-02 (×12): qty 1

## 2024-11-02 MED ORDER — SODIUM ZIRCONIUM CYCLOSILICATE 5 G PO PACK
5.0000 g | PACK | Freq: Once | ORAL | Status: AC
  Administered 2024-11-02: 5 g via ORAL
  Filled 2024-11-02: qty 1

## 2024-11-02 MED ORDER — HYDROMORPHONE HCL 1 MG/ML IJ SOLN
1.0000 mg | INTRAMUSCULAR | Status: DC | PRN
  Administered 2024-11-02 – 2024-11-11 (×30): 1 mg via INTRAVENOUS
  Filled 2024-11-02 (×31): qty 1

## 2024-11-02 MED ORDER — HYDROMORPHONE HCL 1 MG/ML IJ SOLN: 1.0000 mg | INTRAMUSCULAR | Status: DC | PRN

## 2024-11-02 NOTE — Progress Notes (Signed)
 Patient ID: [name], female   DOB: Nov 27, 1945, 79 y.o.   MRN: [id number] Vital signs are stable patient notes pain control is poor and she is having significant centralized back pain.  She is moving her legs but proximal legs feel weak.  I advised at this time that I would like to keep her at bedrest for about a week's time.  Will check a CT scan of the lumbar spine on Monday.  She can be anticoagulated as needed for DVT and pulmonary embolus prophylaxis.  I would certainly leave the Foley in while she is at bedrest.

## 2024-11-02 NOTE — Plan of Care (Signed)
  Problem: Education: Goal: Knowledge of General Education information will improve Description: Including pain rating scale, medication(s)/side effects and non-pharmacologic comfort measures Outcome: Progressing   Problem: Clinical Measurements: Goal: Ability to maintain clinical measurements within normal limits will improve Outcome: Progressing   Problem: Coping: Goal: Level of anxiety will decrease Outcome: Progressing   

## 2024-11-02 NOTE — Progress Notes (Signed)
 OT Cancellation Note  Patient Details Name: [name] MRN: [id number] DOB: 1946/04/29   Cancelled Treatment:    Reason Eval/Treat Not Completed: Active bedrest order  [name] 11/02/2024, 7:01 AM

## 2024-11-02 NOTE — Plan of Care (Signed)
  Problem: Health Behavior/Discharge Planning: Goal: Ability to manage health-related needs will improve Outcome: Progressing   Problem: Clinical Measurements: Goal: Ability to maintain clinical measurements within normal limits will improve Outcome: Progressing Goal: Will remain free from infection Outcome: Progressing   Problem: Nutrition: Goal: Adequate nutrition will be maintained Outcome: Progressing   Problem: Coping: Goal: Level of anxiety will decrease Outcome: Progressing   

## 2024-11-02 NOTE — Progress Notes (Signed)
 PT Cancellation Note  Patient Details Name: ITHZEL FEDORCHAK MRN: [id number] DOB: 02-17-1946   Cancelled Treatment:    Reason Eval/Treat Not Completed: Patient not medically ready. Active bedrest orders for 5 days. Will attempt evaluation when medically appropriate.   Isaiah DEL. Paulette Lynch, PT, DPT  [employer] 11/02/2024, 8:00 AM

## 2024-11-02 NOTE — Progress Notes (Signed)
 [facility] PROGRESS NOTE    Progress Note  [name]  FMW:[id number] DOB: Mar 01, 1946 DOA: 10/31/2024 PCP: Onita Rush, MD     Brief Narrative:   [name] is an 79 y.o. female past medical history significant for PE DVT on Xarelto , history of an IVC filter, multiple back surgeries mild aortic stenosis essential hypertension morbid obesity presents today after mechanical fall CT of the lumbar spine showed Chance fracture of old L4-L5 fusion with significant distraction.  Neurosurgery was consulted who recommended bedrest and a 30 degree angle for pain control and rehab.  They will see in the morning as she may likely need a repeat of her lumbar instrumentation   Assessment/Plan:   Acute Closed lumbar vertebral fracture (HCC) Continue pain control and bowel regimen. Surgery was consulted and she status post open reduction and posterior fixation of L4-L5 fracture dislocation with pedicle screw fixation at L4-L5. Continue narcotics for pain control, continue bowel regimen. Continue to hold anticoagulation.  Surgery to dictate when to restart anticoagulation. Tolerating her diet. Anticipate will need skilled nursing placement.  PT OT has been consulted. She relates she will think about going to skilled nursing facility.  History of PE/DVT and an IVC filter in place: Last dose of Xarelto  was on 10/30/2024 holding for possible surgical intervention.  Morbid obesity: Hold GLP-1 inhibitor.  Acute kidney injury on chronic kidney disease, stage III (moderate) (HCC)/mild hyperkalemia: KVO Fluids creatinine has returned Baseline. Avoid NSAIDs. Her potassium is about the same, we will give another dose of Lokelma . Recheck basic metabolic panel in the morning.  Mild aortic stenosis: Noted.  Hypothyroidism: Continue Synthroid .  Lymphedema of both legs: Likely due to IVC filter.  DVT prophylaxis: lovenox  Family Communication:none Status is: Inpatient Remains  inpatient appropriate because: Acute closed fracture of the lumbar vertebral    Code Status:     Code Status Orders  (From admission, onward)           Start     Ordered   10/31/24 2340  Full code  Continuous       Question:  By:  Answer:  Consent: discussion documented in EHR   10/31/24 2340           Code Status History     Date Active Date Inactive Code Status Order ID Comments User Context   10/01/2021 1940 10/10/2021 0106 Full Code [id number]  [name], NP ED   04/17/2021 1218 04/17/2021 2353 Full Code [id number]  [name], MD Inpatient   03/31/2021 1507 04/01/2021 2120 Full Code [id number]  [name], MD Inpatient   03/03/2021 1949 03/04/2021 0341 Full Code [id number]  [name], MD Inpatient   08/18/2018 2202 08/19/2018 2127 Full Code [id number]  [name], MD Inpatient   12/06/2016 1107 12/07/2016 1910 Full Code [id number]  [name] Inpatient   12/07/2013 1836 12/09/2013 1659 Full Code [id number]  Babish, [name], PA-C Inpatient         IV Access:   Peripheral IV   Procedures and diagnostic studies:   DG Lumbar Spine 2-3 Views Result Date: 11/01/2024 EXAM: 2 VIEW(S) XRAY OF THE LUMBAR SPINE 11/01/2024 08:32:00 PM COMPARISON: CT examination of chain weight 1426 dated 10/31/2024. CLINICAL HISTORY: [id number] Surgery, elective [id number] Surgery, elective [phone] FINDINGS: LUMBAR SPINE: BONES: Vertebral body heights are maintained. Alignment is normal. Posterior lumbar fusion with instrumentation of L4-L5 with bilateral pedicle screws and posterior bars. Reference frame screw noted within the region of the left iliac spine.  Fluoroscopic technique precludes evaluation of bony detail. DISCS AND DEGENERATIVE CHANGES: Widening of the L4-L5 intervertebral disc space similar to that noted on CT examination of chain weight 1426 dated 10/31/2024. No severe degenerative changes. SOFT TISSUES: No acute abnormality. FLUOROSCOPIC DATA: Fluoroscopic  images: 2 Fluoroscopic time: 16 seconds Fluoroscopic dose: 19.13 mg IMPRESSION: 1. Posterior lumbar fusion with instrumentation at L4-5 with bilateral pedicle screws and posterior bars. 2. Widening of the L4-5 intervertebral disc space, similar to that noted on prior CT examination. Electronically signed by: Dorethia Molt MD 11/01/2024 08:40 PM EST RP Workstation: [id number]   DG C-Arm 1-60 Min-No Report Result Date: 11/01/2024 Fluoroscopy was utilized by the requesting physician.  No radiographic interpretation.   DG O-ARM IMAGE ONLY/NO REPORT Result Date: 11/01/2024 There is no Radiologist interpretation  for this exam.  DG FEMUR, MIN 2 VIEWS RIGHT Result Date: 11/01/2024 EXAM: 2 VIEW(S) XRAY OF THE RIGHT FEMUR 11/01/2024 07:12:28 AM COMPARISON: Right knee series dated 10/31/2024. CLINICAL HISTORY: Hip pain. FINDINGS: BONES AND JOINTS: The patient is again noted to be status post right total knee arthroplasty. There is no definite evidence of acute traumatic injury, including no acute fracture or malalignment. SOFT TISSUES: Unremarkable. IMPRESSION: 1. No definite evidence of acute traumatic injury. Status post right total knee arthroplasty. Electronically signed by: Evalene Coho MD 11/01/2024 07:34 AM EST RP Workstation: [id number]   CT Lumbar Spine Wo Contrast Result Date: 10/31/2024 CLINICAL DATA:  Clemens EXAM: CT LUMBAR SPINE WITHOUT CONTRAST TECHNIQUE: Multidetector CT imaging of the lumbar spine was performed without intravenous contrast administration. Multiplanar CT image reconstructions were also generated. RADIATION DOSE REDUCTION: This exam was performed according to the departmental dose-optimization program which includes automated exposure control, adjustment of the mA and/or kV according to patient size and/or use of iterative reconstruction technique. COMPARISON:  10/01/2021 FINDINGS: Segmentation: 5 lumbar type vertebrae. Lumbarization of the S1 vertebral body is again noted.  Alignment: There is a hyper extension injury with distraction across the L5-S1 disc space measuring 2.6 cm. Otherwise alignment is anatomic. Vertebrae: There is a hyper extension injury with a transverse fracture through the bilateral facet joints at the L5-S1 level. Approximally 2.6 cm of distraction across the L5-S1 disc space. No other acute bony abnormalities. Multilevel thoracolumbar spinal fusion is again identified, with intra corporal screws seen at the T9, T11, T12, L1, L2, and L3 levels. Bony fusion across the intervertebral discs from T9 through L1 as well as L2 through L5. Paraspinal and other soft tissues: There is paraspinal soft tissue swelling at the L5-S1 level. Atherosclerosis of the aorta and its distal branches. IVC filter is noted. Disc levels: No significant bony encroachment upon the central canal or neural foramina. Thoracolumbar posterior spinal fusion as described above. At the L5-S1 level at the site of hyperextension injury and transverse fracture through the posterior elements, there is circumferential disc bulge with a left paracentral disc protrusion. This results in severe central canal stenosis. Reconstructed images demonstrate no additional findings. IMPRESSION: 1. Hyperextension injury, with distraction of the L5-S1 disc space measuring 2.6 cm and Chance type fracture through the bilateral L5-S1 facets. Soft tissue edema coupled with a circumferential disc bulge and left paracentral disc protrusion results in severe central canal stenosis at this level. 2. Extensive thoracolumbar posterior spinal fusion as above. 3.  Aortic Atherosclerosis (ICD10-I70.0). Critical Value/emergent results were called by telephone at the time of interpretation on 10/31/2024 at 9:24 pm to provider [facility] , who verbally acknowledged these results. Electronically Signed   By:  Ozell Daring M.D.   On: 10/31/2024 21:24   CT PELVIS WO CONTRAST Result Date: 10/31/2024 EXAM: CT PELVIS, WITHOUT IV  CONTRAST 10/31/2024 08:55:05 PM TECHNIQUE: Axial images were acquired through the pelvis without IV contrast. Reformatted images were reviewed. Automated exposure control, iterative reconstruction, and/or weight based adjustment of the mA/kV was utilized to reduce the radiation dose to as low as reasonably achievable. COMPARISON: None available. CLINICAL HISTORY: Recent fall. FINDINGS: BONES: Changes consistent with left hip replacement are noted. Prior bone graft harvest is noted in the left iliac bone adjacent to the sacrum. No sacral abnormality is seen. Degenerative and postoperative changes in the lower lumbar spine are seen. At L5-S1, there is considerable widening of the disc space with associated soft tissue edema. Numbering is similar to that seen on prior lumbar spine imaging. A fracture line is noted extending through posterior elements at L5-S1 in an area of prior fusion. The widening of the disc space anteriorly is approximately 2.8 cm. The maximum area of distraction in the fracture plane measures approximately 5 mm. This will be better evaluated on concomitant lumbar spine CT. JOINTS: Changes consistent with left hip replacement are noted. Degenerative changes of the right hip joint are noted. Some remodeling of the femoral head is seen. SOFT TISSUES: Aortic calcifications are noted. IVC filter is noted in satisfactory position. Soft tissue edema is associated with the L5-S1 disc space widening and fracture. INTRAPELVIC CONTENTS: Visualized bowel is within normal limits. The uterus has been surgically removed. No free fluid is seen. IMPRESSION: 1. Considerable widening of the L5-1 disc space with associated soft tissue edema and a fracture line extending through the posterior elements in an area of prior lumbar fusion; anterior widening of the disc space is approximately 2.8 cm with up to 5 mm distraction in the fracture plane, better evaluated on the concomitant lumbar spine CT. 2. Left hip  arthroplasty changes. 3. Degenerative changes of the right hip joint with remodeling of the femoral head. Electronically signed by: Oneil Devonshire MD 10/31/2024 09:10 PM EST RP Workstation: [id number]   CT Cervical Spine Wo Contrast Result Date: 10/31/2024 EXAM: CT CERVICAL SPINE WITHOUT CONTRAST 10/31/2024 08:55:05 PM TECHNIQUE: CT of the cervical spine was performed without the administration of intravenous contrast. Multiplanar reformatted images are provided for review. Automated exposure control, iterative reconstruction, and/or weight based adjustment of the mA/kV was utilized to reduce the radiation dose to as low as reasonably achievable. COMPARISON: 05/30/2023 CLINICAL HISTORY: Neck trauma (Age >= 65y). FINDINGS: BONES AND ALIGNMENT: Reversal of the normal cervical lordosis, similar to prior. Trace degenerative retrolisthesis of C2 on C3, C5 on C6, and C6 on C7. Trace degenerative anterolisthesis of C7 on T1, T1 on T2, and T2 on T3. Anterior cervical fusion hardware at C3-C4. Solid osseous fusion from the C3 to C5 levels. No acute fracture or traumatic malalignment. DEGENERATIVE CHANGES: Severe disc space narrowing at C5-C6 and C6-C7. Osteophyte complexes at multiple levels. There is at least mild osseous spinal canal stenosis at C5-C6 and C6-C7. Facet arthrosis and uncovertebral hypertrophy at multiple levels. There is severe foraminal stenosis at C5-C6 and C6-C7. SOFT TISSUES: No prevertebral soft tissue swelling. IMPRESSION: 1. No acute traumatic injury of the cervical spine. 2. Degenerative changes as above. Electronically signed by: Donnice Mania MD 10/31/2024 09:09 PM EST RP Workstation: [id number]   CT Head Wo Contrast Result Date: 10/31/2024 EXAM: CT HEAD WITHOUT CONTRAST 10/31/2024 08:55:05 PM TECHNIQUE: CT of the head was performed without the administration of intravenous  contrast. Automated exposure control, iterative reconstruction, and/or weight based adjustment of the mA/kV was utilized to  reduce the radiation dose to as low as reasonably achievable. COMPARISON: 05/30/2023 CLINICAL HISTORY: Head trauma, minor (Age >= 65y) FINDINGS: BRAIN AND VENTRICLES: No acute hemorrhage. No evidence of acute infarct. No hydrocephalus. No extra-axial collection. No mass effect or midline shift. Mild chronic microvascular ischemic changes. Atherosclerosis of the carotid siphons. ORBITS: Left lens replacement. SINUSES: No acute abnormality. SOFT TISSUES AND SKULL: There is a partially visualized rounded soft tissue lesion along the inferior aspect of the right ear which appears to be measuring at least 1.5 cm (series 3, image 1). This finding is likely present on prior studies but appears significantly increased in size. Recommend correlation with physical exam findings. No skull fracture. IMPRESSION: 1. No acute intracranial abnormality. 2. Partially visualized rounded soft tissue lesion along the inferior aspect of the right ear measuring at least 1.5 cm, likely present on prior studies but significantly increased in size. Recommend correlation with findings on physical examination. Electronically signed by: Donnice Mania MD 10/31/2024 09:03 PM EST RP Workstation: [id number]   DG Knee 2 Views Right Result Date: 10/31/2024 CLINICAL DATA:  Unwitnessed fall EXAM: DG KNEE 1-2V*R* COMPARISON:  12/12/2006 FINDINGS: Interval revision of the knee arthroplasty compared to remote exam from 2008. No definitive fracture is seen. Dystrophic appearing calcifications within the anterior infrapatellar soft tissues. No sizable knee effusion IMPRESSION: Knee arthroplasty without definitive fracture on limited two views. Electronically Signed   By: Luke Bun M.D.   On: 10/31/2024 20:15   DG Chest 2 View Result Date: 10/31/2024 EXAM: 2 VIEW(S) XRAY OF THE CHEST 10/31/2024 08:08:00 PM COMPARISON: None available. CLINICAL HISTORY: fall fall fall fall fall FINDINGS: LUNGS AND PLEURA: Low lung volumes. Left basilar atelectasis. No  pleural effusion. No pneumothorax. HEART AND MEDIASTINUM: Aortic arch calcifications. No acute abnormality of the cardiac and mediastinal silhouettes. BONES AND SOFT TISSUES: Thoracic spine surgical hardware noted. Right shoulder arthroplasty noted. Cervical spine surgical hardware noted. IMPRESSION: 1. No acute cardiopulmonary abnormality identified. 2. Low lung volumes with left basilar atelectasis. Electronically signed by: Dorethia Molt MD 10/31/2024 08:13 PM EST RP Workstation: [id number]     Medical Consultants:   None.   Subjective:    [name] relates her pain is controlled.  Objective:    Vitals:   11/01/24 2232 11/02/24 0144 11/02/24 0401 11/02/24 0716  BP: (!) 159/71  (!) 144/57 (!) 162/56  Pulse: 87  76 68  Resp: 16  15 16   Temp: 98.4 F (36.9 C)  98.7 F (37.1 C) 98.4 F (36.9 C)  TempSrc: Oral  Oral   SpO2: 97%  97% 97%  Weight:  (!) 138.1 kg    Height:       SpO2: 97 % O2 Flow Rate (L/min): 2 L/min   Intake/Output Summary (Last 24 hours) at 11/02/2024 1015 Last data filed at 11/02/2024 0500 Gross per 24 hour  Intake 2928.25 ml  Output 600 ml  Net 2328.25 ml   Filed Weights   10/31/24 1832 11/02/24 0144  Weight: 124.3 kg (!) 138.1 kg    Exam: General exam: In no acute distress. Respiratory system: Good air movement and clear to auscultation. Cardiovascular system: S1 & S2 heard, RRR. No JVD. Gastrointestinal system: Abdomen is nondistended, soft and nontender.  Extremities: No pedal edema. Skin: No rashes, lesions or ulcers Psychiatry: Judgement and insight appear normal. Mood & affect appropriate. Data Reviewed:    Labs: Basic Metabolic  Panel: Recent Labs  Lab 10/31/24 2025 11/01/24 0429 11/02/24 0508  NA 133* 133* 133*  K 4.9 5.3* 5.2*  CL 92* 95* 98  CO2 27 29 26   GLUCOSE 115* 149* 145*  BUN 35* 34* 35*  CREATININE 1.72* 1.52* 1.30*  CALCIUM  10.3 9.9 9.8   GFR Estimated Creatinine Clearance: 49.6 mL/min (A) (by C-G  formula based on SCr of 1.3 mg/dL (H)). Liver Function Tests: No results for input(s): AST, ALT, ALKPHOS, BILITOT, PROT, ALBUMIN in the last 168 hours. No results for input(s): LIPASE, AMYLASE in the last 168 hours. No results for input(s): AMMONIA in the last 168 hours. Coagulation profile No results for input(s): INR, PROTIME in the last 168 hours. COVID-19 Labs  No results for input(s): DDIMER, FERRITIN, LDH, CRP in the last 72 hours.  Lab Results  Component Value Date   SARSCOV2NAA NEGATIVE 10/01/2021   SARSCOV2NAA POSITIVE (A) 04/16/2021   SARSCOV2NAA NEGATIVE 03/30/2021   SARSCOV2NAA NEGATIVE 03/02/2021    CBC: Recent Labs  Lab 10/31/24 2025 11/01/24 0429  WBC 10.7* 9.9  HGB 11.4* 10.6*  HCT 35.6* 33.1*  MCV 89.7 89.5  PLT 270 250   Cardiac Enzymes: No results for input(s): CKTOTAL, CKMB, CKMBINDEX, TROPONINI in the last 168 hours. BNP (last 3 results) Recent Labs    07/02/24 1152  PROBNP 331   CBG: No results for input(s): GLUCAP in the last 168 hours. D-Dimer: No results for input(s): DDIMER in the last 72 hours. Hgb A1c: No results for input(s): HGBA1C in the last 72 hours. Lipid Profile: No results for input(s): CHOL, HDL, LDLCALC, TRIG, CHOLHDL, LDLDIRECT in the last 72 hours. Thyroid  function studies: No results for input(s): TSH, T4TOTAL, T3FREE, THYROIDAB in the last 72 hours.  Invalid input(s): FREET3 Anemia work up: No results for input(s): VITAMINB12, FOLATE, FERRITIN, TIBC, IRON, RETICCTPCT in the last 72 hours. Sepsis Labs: Recent Labs  Lab 10/31/24 2025 11/01/24 0429  WBC 10.7* 9.9   Microbiology Recent Results (from the past 240 hours)  Surgical pcr screen     Status: None   Collection Time: 11/01/24  3:25 PM   Specimen: Nasal Mucosa; Nasal Swab  Result Value Ref Range Status   MRSA, PCR NEGATIVE NEGATIVE Final   Staphylococcus aureus NEGATIVE NEGATIVE  Final    Comment: (NOTE) The Xpert SA Assay (FDA approved for NASAL specimens in patients 64 years of age and older), is one component of a comprehensive surveillance program. It is not intended to diagnose infection nor to guide or monitor treatment. Performed at [facility], [address] [place], [place] [address]      Medications:    acidophilus  1 capsule Oral q AM   ALPRAZolam   0.5 mg Oral BID   atenolol   25 mg Oral Daily   docusate sodium   100 mg Oral BID   escitalopram   5 mg Oral QHS   famotidine   40 mg Oral QHS   gabapentin   600 mg Oral TID   levothyroxine   25 mcg Oral Q M,W,F   pantoprazole   40 mg Oral Daily   polyethylene glycol  17 g Oral BID   senna  1 tablet Oral BID   sodium chloride  flush  3 mL Intravenous Q12H   sodium chloride  flush  3 mL Intravenous Q12H   Continuous Infusions:  sodium chloride      sodium chloride  250 mL (11/02/24 0005)      LOS: 2 days   [name]  [facility]  11/02/2024, 10:15 AM

## 2024-11-02 NOTE — Care Management Important Message (Signed)
 Important Message  Patient Details  Name: [name] MRN: [id number] Date of Birth: 06-Apr-1946   Important Message Given:  Yes - Medicare IM     Jennie Laneta Dragon 11/02/2024, 2:17 PM

## 2024-11-02 NOTE — Anesthesia Postprocedure Evaluation (Signed)
"   Anesthesia Post Note  Patient: [name]  Procedure(s) Performed: LAMINECTOMY WITH POSTERIOR LATERAL ARTHRODESIS LEVEL 1 (Back) APPLICATION OF O-ARM     Patient location during evaluation: PACU Anesthesia Type: General Level of consciousness: awake and alert Pain management: pain level controlled Vital Signs Assessment: post-procedure vital signs reviewed and stable Respiratory status: spontaneous breathing, nonlabored ventilation, respiratory function stable and patient connected to nasal cannula oxygen Cardiovascular status: blood pressure returned to baseline and stable Postop Assessment: no apparent nausea or vomiting Anesthetic complications: no   No notable events documented.  Last Vitals:  Vitals:   11/02/24 0401 11/02/24 0716  BP: (!) 144/57 (!) 162/56  Pulse: 76 68  Resp: 15 16  Temp: 37.1 C 36.9 C  SpO2: 97% 97%    Last Pain:  Vitals:   11/02/24 0626  TempSrc:   PainSc: 4                  [name]      "

## 2024-11-03 DIAGNOSIS — N1831 Chronic kidney disease, stage 3a: Secondary | ICD-10-CM | POA: Diagnosis not present

## 2024-11-03 DIAGNOSIS — S32059A Unspecified fracture of fifth lumbar vertebra, initial encounter for closed fracture: Secondary | ICD-10-CM | POA: Diagnosis not present

## 2024-11-03 DIAGNOSIS — I350 Nonrheumatic aortic (valve) stenosis: Secondary | ICD-10-CM | POA: Diagnosis not present

## 2024-11-03 LAB — BASIC METABOLIC PANEL WITH GFR
Anion gap: 8 (ref 5–15)
BUN: 34 mg/dL — ABNORMAL HIGH (ref 8–23)
CO2: 27 mmol/L (ref 22–32)
Calcium: 9.6 mg/dL (ref 8.9–10.3)
Chloride: 98 mmol/L (ref 98–111)
Creatinine, Ser: 1.07 mg/dL — ABNORMAL HIGH (ref 0.44–1.00)
GFR, Estimated: 53 mL/min — ABNORMAL LOW
Glucose, Bld: 108 mg/dL — ABNORMAL HIGH (ref 70–99)
Potassium: 4.5 mmol/L (ref 3.5–5.1)
Sodium: 132 mmol/L — ABNORMAL LOW (ref 135–145)

## 2024-11-03 MED ORDER — OXYCODONE HCL 5 MG PO TABS
10.0000 mg | ORAL_TABLET | ORAL | Status: DC | PRN
  Administered 2024-11-03 – 2024-11-07 (×14): 10 mg via ORAL
  Filled 2024-11-03 (×15): qty 2

## 2024-11-03 MED ORDER — CHLORHEXIDINE GLUCONATE CLOTH 2 % EX PADS
6.0000 | MEDICATED_PAD | Freq: Every day | CUTANEOUS | Status: DC
  Administered 2024-11-04 – 2024-11-20 (×17): 6 via TOPICAL

## 2024-11-03 MED ORDER — ENOXAPARIN SODIUM 80 MG/0.8ML IJ SOSY
70.0000 mg | PREFILLED_SYRINGE | INTRAMUSCULAR | Status: DC
  Administered 2024-11-04 – 2024-11-05 (×2): 70 mg via SUBCUTANEOUS
  Filled 2024-11-03 (×3): qty 0.7

## 2024-11-03 MED ORDER — RIVAROXABAN 20 MG PO TABS: 20.0000 mg | ORAL_TABLET | Freq: Every day | ORAL | Status: DC

## 2024-11-03 MED ORDER — SODIUM CHLORIDE 0.9 % IV SOLN: INTRAVENOUS | Status: AC

## 2024-11-03 MED ORDER — RIVAROXABAN 20 MG PO TABS
20.0000 mg | ORAL_TABLET | Freq: Every day | ORAL | Status: DC
  Administered 2024-11-03: 20 mg via ORAL
  Filled 2024-11-03: qty 1

## 2024-11-03 NOTE — Plan of Care (Signed)
  Problem: Clinical Measurements: Goal: Ability to maintain clinical measurements within normal limits will improve Outcome: Progressing Goal: Will remain free from infection Outcome: Progressing Goal: Cardiovascular complication will be avoided Outcome: Progressing   Problem: Nutrition: Goal: Adequate nutrition will be maintained Outcome: Progressing   Problem: Pain Managment: Goal: General experience of comfort will improve and/or be controlled Outcome: Progressing

## 2024-11-03 NOTE — Progress Notes (Signed)
 Patient ID: [name], female   DOB: May 21, 1946, 79 y.o.   MRN: [id number] BP (!) 177/61 (BP Location: Right Wrist)   Pulse 80   Temp 98.1 F (36.7 C) (Oral)   Resp 15   Ht 5' 4 (1.626 m)   Wt (!) 138.1 kg   LMP  (LMP Unknown)   SpO2 94%   BMI 52.26 kg/m  Alert and oriented x 4, can move extremities. In a great deal of pain, but it is better, much so, than yesterday.  Wound is clean, no signs of infection.  Bedrest at this time.

## 2024-11-03 NOTE — Progress Notes (Signed)
 [facility] PROGRESS NOTE    Progress Note  [name]  FMW:[id number] DOB: 1946/02/02 DOA: 10/31/2024 PCP: Onita Rush, MD     Brief Narrative:   [name] is an 79 y.o. female past medical history significant for PE DVT on Xarelto , history of an IVC filter, multiple back surgeries mild aortic stenosis essential hypertension morbid obesity presents today after mechanical fall CT of the lumbar spine showed Chance fracture of old L4-L5 fusion with significant distraction.  Neurosurgery was consulted who recommended bedrest and a 30 degree angle for pain control and rehab.  They will see in the morning as she may likely need a repeat of her lumbar instrumentation.  Assessment/Plan:   Acute Closed lumbar vertebral fracture (HCC) She relates her pain is not controlled, continue bowel regimen. Surgery was consulted and she status post open reduction and posterior fixation of L4-L5 fracture dislocation with pedicle screw fixation at L4-L5. Continue narcotics for pain control, continue bowel regimen. Neurosurgery will dislocate start Lovenox  for DVT prophylaxis. Tolerating her diet. Anticipate will need skilled nursing placement.   Neurosurgery recommended bedrest for about a week, he wants to repeat the CT scan on 11/05/2024. She is hesitant on wanting to go to skilled nursing facility..  History of PE/DVT and an IVC filter in place: Last dose of Xarelto  was on 10/30/2024 holding for possible surgical intervention. Continue to hold Xarelto .  Morbid obesity: Hold GLP-1 inhibitor.  Acute kidney injury on chronic kidney disease, stage III (moderate) (HCC)/mild hyperkalemia: KVO Fluids creatinine has returned Baseline. Avoid NSAIDs. Potassium is improved this morning.  Mild aortic stenosis: Noted.  Hypothyroidism: Continue Synthroid .  Lymphedema of both legs: Likely due to IVC filter.  DVT prophylaxis: lovenox  Family Communication:none Status is:  Inpatient Remains inpatient appropriate because: Acute closed fracture of the lumbar vertebral    Code Status:     Code Status Orders  (From admission, onward)           Start     Ordered   10/31/24 2340  Full code  Continuous       Question:  By:  Answer:  Consent: discussion documented in EHR   10/31/24 2340           Code Status History     Date Active Date Inactive Code Status Order ID Comments User Context   10/01/2021 1940 10/10/2021 0106 Full Code [id number]  [name], NP ED   04/17/2021 1218 04/17/2021 2353 Full Code [id number]  [name], MD Inpatient   03/31/2021 1507 04/01/2021 2120 Full Code [id number]  [name], MD Inpatient   03/03/2021 1949 03/04/2021 0341 Full Code [id number]  [name], MD Inpatient   08/18/2018 2202 08/19/2018 2127 Full Code [id number]  [name], MD Inpatient   12/06/2016 1107 12/07/2016 1910 Full Code [id number]  [name] Inpatient   12/07/2013 1836 12/09/2013 1659 Full Code [id number]  Babish, [name], PA-C Inpatient         IV Access:   Peripheral IV   Procedures and diagnostic studies:   DG O-ARM IMAGE ONLY/NO REPORT Result Date: 11/01/2024 EXAM: 1 VIEW(S) XRAY OF THE CHEST 11/01/2024 06:31:00 PM COMPARISON: CT examination dated 12/01/2024. CLINICAL HISTORY: FINDINGS: LINES, TUBES AND DEVICES: Inferior vena cava filter noted and expected position. LUNGS AND PLEURA: No focal pulmonary opacity. No pleural effusion. No pneumothorax. HEART AND MEDIASTINUM: No acute abnormality of the cardiac and mediastinal silhouettes. BONES AND SOFT TISSUES: Intraoperative cone beam CT imaging demonstrates a lumbar spine  from L2 through the sacrum. There is widening of the intervertebral disc space of L4-L5 similar to CT examination dated 12/01/2024 in keeping with known chance fracture in this region. Minimally displaced fracture of the left transverse process of L4 noted. There is mature callus of L2-S1  posteriorly in keeping with prior posterior spinal fusion. Bilateral pedicle screws are seen at L2 with bilateral laminectomy and posterior decompression of L2. Medical screw defects were seen within the pedicles of L3-L5. Right L3 and left L5 laminectomy have been performed. Reference for any screws seen within the left iliac spine. Fluoroscopic technique precludes evaluation of the soft tissues though extensive infiltration is seen within the retroperitoneum likely representing retroperitoneal hemorrhage given above described findings. IMPRESSION: 1. Extensive retroperitoneal infiltration, suspicious for retroperitoneal hemorrhage. 2. Widening of the L4-5 intervertebral disc space, compatible with known Chance fracture. 3. Minimally displaced left L4 transverse process fracture. 4. Postsurgical changes of L2-S1 posterior spinal fusion with laminectomy and posterior decompression. 5. Inferior vena cava filter in expected position. Electronically signed by: Dorethia Molt MD 11/01/2024 08:45 PM EST RP Workstation: [id number]   DG Lumbar Spine 2-3 Views Result Date: 11/01/2024 EXAM: 2 VIEW(S) XRAY OF THE LUMBAR SPINE 11/01/2024 08:32:00 PM COMPARISON: CT examination of chain weight 1426 dated 10/31/2024. CLINICAL HISTORY: [id number] Surgery, elective [id number] Surgery, elective [id number] FINDINGS: LUMBAR SPINE: BONES: Vertebral body heights are maintained. Alignment is normal. Posterior lumbar fusion with instrumentation of L4-L5 with bilateral pedicle screws and posterior bars. Reference frame screw noted within the region of the left iliac spine. Fluoroscopic technique precludes evaluation of bony detail. DISCS AND DEGENERATIVE CHANGES: Widening of the L4-L5 intervertebral disc space similar to that noted on CT examination of chain weight 1426 dated 10/31/2024. No severe degenerative changes. SOFT TISSUES: No acute abnormality. FLUOROSCOPIC DATA: Fluoroscopic images: 2 Fluoroscopic time: 16 seconds Fluoroscopic dose: 19.13  mg IMPRESSION: 1. Posterior lumbar fusion with instrumentation at L4-5 with bilateral pedicle screws and posterior bars. 2. Widening of the L4-5 intervertebral disc space, similar to that noted on prior CT examination. Electronically signed by: Dorethia Molt MD 11/01/2024 08:40 PM EST RP Workstation: [id number]   DG C-Arm 1-60 Min-No Report Result Date: 11/01/2024 Fluoroscopy was utilized by the requesting physician.  No radiographic interpretation.     Medical Consultants:   None.   Subjective:    [name] relates her pain is not controlled this morning.  Objective:    Vitals:   11/02/24 1522 11/02/24 1940 11/03/24 0250 11/03/24 0817  BP: (!) 154/64 (!) 126/48 (!) 132/51 (!) 177/61  Pulse: 69 72 77 80  Resp: 18 16 13 15   Temp: 98 F (36.7 C) 98.9 F (37.2 C) 98.7 F (37.1 C) 98.1 F (36.7 C)  TempSrc:   Oral Oral  SpO2: 94% 94% 96% 94%  Weight:      Height:       SpO2: 94 % O2 Flow Rate (L/min): 2 L/min   Intake/Output Summary (Last 24 hours) at 11/03/2024 0941 Last data filed at 11/03/2024 0128 Gross per 24 hour  Intake 120 ml  Output 1250 ml  Net -1130 ml   Filed Weights   10/31/24 1832 11/02/24 0144  Weight: 124.3 kg (!) 138.1 kg    Exam: General exam: In no acute distress. Respiratory system: Good air movement and clear to auscultation. Cardiovascular system: S1 & S2 heard, RRR. No JVD. Gastrointestinal system: Abdomen is nondistended, soft and nontender.  Extremities: No pedal edema. Skin: No rashes, lesions or ulcers Psychiatry: Judgement  and insight appear normal. Mood & affect appropriate. Data Reviewed:    Labs: Basic Metabolic Panel: Recent Labs  Lab 10/31/24 2025 11/01/24 0429 11/02/24 0508 11/03/24 0408  NA 133* 133* 133* 132*  K 4.9 5.3* 5.2* 4.5  CL 92* 95* 98 98  CO2 27 29 26 27   GLUCOSE 115* 149* 145* 108*  BUN 35* 34* 35* 34*  CREATININE 1.72* 1.52* 1.30* 1.07*  CALCIUM  10.3 9.9 9.8 9.6   GFR Estimated Creatinine  Clearance: 60.3 mL/min (A) (by C-G formula based on SCr of 1.07 mg/dL (H)). Liver Function Tests: No results for input(s): AST, ALT, ALKPHOS, BILITOT, PROT, ALBUMIN in the last 168 hours. No results for input(s): LIPASE, AMYLASE in the last 168 hours. No results for input(s): AMMONIA in the last 168 hours. Coagulation profile No results for input(s): INR, PROTIME in the last 168 hours. COVID-19 Labs  No results for input(s): DDIMER, FERRITIN, LDH, CRP in the last 72 hours.  Lab Results  Component Value Date   SARSCOV2NAA NEGATIVE 10/01/2021   SARSCOV2NAA POSITIVE (A) 04/16/2021   SARSCOV2NAA NEGATIVE 03/30/2021   SARSCOV2NAA NEGATIVE 03/02/2021    CBC: Recent Labs  Lab 10/31/24 2025 11/01/24 0429  WBC 10.7* 9.9  HGB 11.4* 10.6*  HCT 35.6* 33.1*  MCV 89.7 89.5  PLT 270 250   Cardiac Enzymes: No results for input(s): CKTOTAL, CKMB, CKMBINDEX, TROPONINI in the last 168 hours. BNP (last 3 results) Recent Labs    07/02/24 1152  PROBNP 331   CBG: No results for input(s): GLUCAP in the last 168 hours. D-Dimer: No results for input(s): DDIMER in the last 72 hours. Hgb A1c: No results for input(s): HGBA1C in the last 72 hours. Lipid Profile: No results for input(s): CHOL, HDL, LDLCALC, TRIG, CHOLHDL, LDLDIRECT in the last 72 hours. Thyroid  function studies: No results for input(s): TSH, T4TOTAL, T3FREE, THYROIDAB in the last 72 hours.  Invalid input(s): FREET3 Anemia work up: No results for input(s): VITAMINB12, FOLATE, FERRITIN, TIBC, IRON, RETICCTPCT in the last 72 hours. Sepsis Labs: Recent Labs  Lab 10/31/24 2025 11/01/24 0429  WBC 10.7* 9.9   Microbiology Recent Results (from the past 240 hours)  Surgical pcr screen     Status: None   Collection Time: 11/01/24  3:25 PM   Specimen: Nasal Mucosa; Nasal Swab  Result Value Ref Range Status   MRSA, PCR NEGATIVE NEGATIVE Final    Staphylococcus aureus NEGATIVE NEGATIVE Final    Comment: (NOTE) The Xpert SA Assay (FDA approved for NASAL specimens in patients 8 years of age and older), is one component of a comprehensive surveillance program. It is not intended to diagnose infection nor to guide or monitor treatment. Performed at [facility], [address] ,  [address]      Medications:    acidophilus  1 capsule Oral q AM   ALPRAZolam   0.5 mg Oral BID   atenolol   25 mg Oral Daily   docusate sodium   100 mg Oral BID   escitalopram   5 mg Oral QHS   famotidine   40 mg Oral QHS   gabapentin   600 mg Oral TID   levothyroxine   25 mcg Oral Q M,W,F   pantoprazole   40 mg Oral Daily   polyethylene glycol  17 g Oral BID   senna  1 tablet Oral BID   sodium chloride  flush  3 mL Intravenous Q12H   Continuous Infusions:  sodium chloride         LOS: 3 days  Erle Odell Castor  [facility]  11/03/2024, 9:41 AM

## 2024-11-04 DIAGNOSIS — S32059A Unspecified fracture of fifth lumbar vertebra, initial encounter for closed fracture: Secondary | ICD-10-CM | POA: Diagnosis not present

## 2024-11-04 DIAGNOSIS — I350 Nonrheumatic aortic (valve) stenosis: Secondary | ICD-10-CM | POA: Diagnosis not present

## 2024-11-04 DIAGNOSIS — N1831 Chronic kidney disease, stage 3a: Secondary | ICD-10-CM | POA: Diagnosis not present

## 2024-11-04 NOTE — Progress Notes (Signed)
 Assessment 79 year old female with history of morbid obesity, long segment thoracolumbar fusion who is status post L4-5 ORIF for the treatment of 3 column L4-5 fracture  LOS: 4 days    Plan: Bedrest for 1 week postop Okay for DVT chemoprophylaxis Supportive cares  Subjective: Back pain under control  Objective: Vital signs in last 24 hours: Temp:  [97.5 F (36.4 C)-99.2 F (37.3 C)] 97.5 F (36.4 C) (01/18 0741) Pulse Rate:  [72-82] 73 (01/18 0741) Resp:  [16-18] 18 (01/18 0741) BP: (141-154)/(46-64) 154/64 (01/18 0741) SpO2:  [92 %-96 %] 96 % (01/18 0741)  Intake/Output from previous day: 01/17 0701 - 01/18 0700 In: 1530 [P.O.:880; I.V.:650] Out: 1250 [Urine:1250] Intake/Output this shift: No intake/output data recorded.  Exam: Alert, oriented MAEx4, per patient at baseline  Lab Results: No results for input(s): WBC, HGB, HCT, PLT in the last 72 hours. BMET Recent Labs    11/02/24 0508 11/03/24 0408  NA 133* 132*  K 5.2* 4.5  CL 98 98  CO2 26 27  GLUCOSE 145* 108*  BUN 35* 34*  CREATININE 1.30* 1.07*  CALCIUM  9.8 9.6       Dorn JONELLE Glade 11/04/2024, 11:02 AM

## 2024-11-04 NOTE — Progress Notes (Signed)
 [facility] PROGRESS NOTE    Progress Note  [name]  FMW:[id number] DOB: [birth year] DOA: 10/31/2024 PCP: Onita Rush, MD     Brief Narrative:   [name] is an 79 y.o. female past medical history significant for PE DVT on Xarelto , history of an IVC filter, multiple back surgeries mild aortic stenosis essential hypertension morbid obesity presents today after mechanical fall CT of the lumbar spine showed Chance fracture of old L4-L5 fusion with significant distraction.  Neurosurgery was consulted who recommended bedrest and a 30 degree angle for pain control and rehab.  They will see in the morning as she may likely need a repeat of her lumbar instrumentation.  Assessment/Plan:   Acute Closed lumbar vertebral fracture [facility]) Surgery was consulted and she status post open reduction and posterior fixation of L4-L5 fracture dislocation with pedicle screw fixation at L4-L5. Continue narcotics for pain control, continue bowel regimen. Appreciate neurosurgery's assistance, continue Lovenox  for DVT prophylaxis. Tolerating her diet. Anticipate will need skilled nursing placement.   Neurosurgery recommended bedrest for about a week, he wants to repeat the CT scan on 11/05/2024. She is hesitant on wanting to go to skilled nursing facility..  History of PE/DVT and an IVC filter in place: Last dose of Xarelto  was on 10/30/2024 holding for possible surgical intervention. Continue to hold Xarelto .  Morbid obesity: Hold GLP-1 inhibitor.  Acute kidney injury on chronic kidney disease, stage III (moderate) (HCC)/mild hyperkalemia: KVO Fluids creatinine has returned Baseline. Avoid NSAIDs.  Mild aortic stenosis: Noted.  Hypothyroidism: Continue Synthroid .  Lymphedema of both legs: Likely due to IVC filter.  DVT prophylaxis: lovenox  Family Communication:none Status is: Inpatient Remains inpatient appropriate because: Acute closed fracture of the lumbar  vertebral    Code Status:     Code Status Orders  (From admission, onward)           Start     Ordered   10/31/24 2340  Full code  Continuous       Question:  By:  Answer:  Consent: discussion documented in EHR   10/31/24 2340           Code Status History     Date Active Date Inactive Code Status Order ID Comments User Context   10/01/2021 1940 10/10/2021 0106 Full Code [id number]  [name], NP ED   04/17/2021 1218 04/17/2021 2353 Full Code [id number]  [name], MD Inpatient   03/31/2021 1507 04/01/2021 2120 Full Code [id number]  [name], MD Inpatient   03/03/2021 1949 03/04/2021 0341 Full Code [id number]  [name], MD Inpatient   08/18/2018 2202 08/19/2018 2127 Full Code [id number]  [name], MD Inpatient   12/06/2016 1107 12/07/2016 1910 Full Code [id number]  [name] Inpatient   12/07/2013 1836 12/09/2013 1659 Full Code [id number]  Babish, [name], PA-C Inpatient         IV Access:   Peripheral IV   Procedures and diagnostic studies:   No results found.    Medical Consultants:   None.   Subjective:    Laria L Hagwood pain is controlled.  Objective:    Vitals:   11/03/24 1507 11/03/24 2250 11/04/24 0215 11/04/24 0741  BP: (!) 141/55 (!) 141/46 (!) 146/62 (!) 154/64  Pulse: 72 79 82 73  Resp: 16 16 18 18   Temp: 99.2 F (37.3 C) 98.8 F (37.1 C) 99 F (37.2 C) (!) 97.5 F (36.4 C)  TempSrc: Oral Oral Oral   SpO2: 92% 96%  95% 96%  Weight:      Height:       SpO2: 96 % O2 Flow Rate (L/min): 2 L/min   Intake/Output Summary (Last 24 hours) at 11/04/2024 0842 Last data filed at 11/04/2024 0500 Gross per 24 hour  Intake 1530 ml  Output 1250 ml  Net 280 ml   Filed Weights   10/31/24 1832 11/02/24 0144  Weight: 124.3 kg (!) 138.1 kg    Exam: General exam: In no acute distress. Respiratory system: Good air movement and clear to auscultation. Cardiovascular system: S1 & S2 heard, RRR. No  JVD. Gastrointestinal system: Abdomen is nondistended, soft and nontender.  Extremities: No pedal edema. Skin: No rashes, lesions or ulcers Psychiatry: Judgement and insight appear normal. Mood & affect appropriate. Data Reviewed:    Labs: Basic Metabolic Panel: Recent Labs  Lab 10/31/24 2025 11/01/24 0429 11/02/24 0508 11/03/24 0408  NA 133* 133* 133* 132*  K 4.9 5.3* 5.2* 4.5  CL 92* 95* 98 98  CO2 27 29 26 27   GLUCOSE 115* 149* 145* 108*  BUN 35* 34* 35* 34*  CREATININE 1.72* 1.52* 1.30* 1.07*  CALCIUM  10.3 9.9 9.8 9.6   GFR Estimated Creatinine Clearance: 60.3 mL/min (A) (by C-G formula based on SCr of 1.07 mg/dL (H)). Liver Function Tests: No results for input(s): AST, ALT, ALKPHOS, BILITOT, PROT, ALBUMIN in the last 168 hours. No results for input(s): LIPASE, AMYLASE in the last 168 hours. No results for input(s): AMMONIA in the last 168 hours. Coagulation profile No results for input(s): INR, PROTIME in the last 168 hours. COVID-19 Labs  No results for input(s): DDIMER, FERRITIN, LDH, CRP in the last 72 hours.  Lab Results  Component Value Date   SARSCOV2NAA NEGATIVE 10/01/2021   SARSCOV2NAA POSITIVE (A) 04/16/2021   SARSCOV2NAA NEGATIVE 03/30/2021   SARSCOV2NAA NEGATIVE 03/02/2021    CBC: Recent Labs  Lab 10/31/24 2025 11/01/24 0429  WBC 10.7* 9.9  HGB 11.4* 10.6*  HCT 35.6* 33.1*  MCV 89.7 89.5  PLT 270 250   Cardiac Enzymes: No results for input(s): CKTOTAL, CKMB, CKMBINDEX, TROPONINI in the last 168 hours. BNP (last 3 results) Recent Labs    07/02/24 1152  PROBNP 331   CBG: No results for input(s): GLUCAP in the last 168 hours. D-Dimer: No results for input(s): DDIMER in the last 72 hours. Hgb A1c: No results for input(s): HGBA1C in the last 72 hours. Lipid Profile: No results for input(s): CHOL, HDL, LDLCALC, TRIG, CHOLHDL, LDLDIRECT in the last 72 hours. Thyroid  function  studies: No results for input(s): TSH, T4TOTAL, T3FREE, THYROIDAB in the last 72 hours.  Invalid input(s): FREET3 Anemia work up: No results for input(s): VITAMINB12, FOLATE, FERRITIN, TIBC, IRON, RETICCTPCT in the last 72 hours. Sepsis Labs: Recent Labs  Lab 10/31/24 2025 11/01/24 0429  WBC 10.7* 9.9   Microbiology Recent Results (from the past 240 hours)  Surgical pcr screen     Status: None   Collection Time: 11/01/24  3:25 PM   Specimen: Nasal Mucosa; Nasal Swab  Result Value Ref Range Status   MRSA, PCR NEGATIVE NEGATIVE Final   Staphylococcus aureus NEGATIVE NEGATIVE Final    Comment: (NOTE) The Xpert SA Assay (FDA approved for NASAL specimens in patients 5 years of age and older), is one component of a comprehensive surveillance program. It is not intended to diagnose infection nor to guide or monitor treatment. Performed at [facility], [address], [place], [place] [address]  Medications:    acidophilus  1 capsule Oral q AM   ALPRAZolam   0.5 mg Oral BID   atenolol   25 mg Oral Daily   Chlorhexidine  Gluconate Cloth  6 each Topical Daily   docusate sodium   100 mg Oral BID   enoxaparin  (LOVENOX ) injection  70 mg Subcutaneous Q24H   escitalopram   5 mg Oral QHS   famotidine   40 mg Oral QHS   gabapentin   600 mg Oral TID   levothyroxine   25 mcg Oral Q M,W,F   pantoprazole   40 mg Oral Daily   senna  1 tablet Oral BID   sodium chloride  flush  3 mL Intravenous Q12H   Continuous Infusions:  sodium chloride         LOS: 4 days   Erle Odell Castor  [facility]  11/04/2024, 8:42 AM

## 2024-11-04 NOTE — Plan of Care (Signed)
  Problem: Education: Goal: Knowledge of General Education information will improve Description: Including pain rating scale, medication(s)/side effects and non-pharmacologic comfort measures Outcome: Progressing   Problem: Clinical Measurements: Goal: Ability to maintain clinical measurements within normal limits will improve Outcome: Progressing Goal: Will remain free from infection Outcome: Progressing Goal: Cardiovascular complication will be avoided Outcome: Progressing   Problem: Nutrition: Goal: Adequate nutrition will be maintained Outcome: Progressing

## 2024-11-04 NOTE — Progress Notes (Signed)
 PHARMACY - ANTICOAGULATION CONSULT NOTE  Pharmacy Consult for Xarelto  to Lovenox  for VTE ppx Indication: VTE prophylaxis  Allergies[1]  Patient Measurements: Height: 5' 4 (162.6 cm) Weight: (!) 138.1 kg (304 lb 7.3 oz) IBW/kg (Calculated) : 54.7 HEPARIN  DW (KG): 85.1  Vital Signs: Temp: 97.5 F (36.4 C) (01/18 0741) Temp Source: Oral (01/18 0215) BP: 154/64 (01/18 0741) Pulse Rate: 73 (01/18 0741)  Labs: Recent Labs    11/02/24 0508 11/03/24 0408  CREATININE 1.30* 1.07*    Estimated Creatinine Clearance: 60.3 mL/min (A) (by C-G formula based on SCr of 1.07 mg/dL (H)).   Medical History: Past Medical History:  Diagnosis Date   Anemia    Arthritis    osteoarthritis   Claustrophobia    Complication of anesthesia    hard time waking up after her gallbladder surgery 28 years ago   DVT (deep venous thrombosis) (HCC)    left leg   GERD (gastroesophageal reflux disease)    hx of   Heart murmur    Dr. Claudene follows, 'nothing to worry about.'   History of blood transfusion    History of hiatal hernia    small   History of kidney stones    Hypercholesteremia    under control   Hypertension    Hypothyroidism    Mild aortic stenosis    a. by echo 12/2017.   Panic attack    PONV (postoperative nausea and vomiting)    Pulmonary embolism (HCC)    bilateral lungs - 2008 after disc surgery   Assessment: 79 year old female on Xarelto  PTA for history of PE/DVT with IVC filter in place. Now receiving Lovenox  for VTE prophylaxis pending surgery plans.  Goal of Therapy:  Appropriate dosing Monitor platelets by anticoagulation protocol: Yes   Plan:  Lovenox  at 70 mg sq Q 24 hours for VTE ppx (0.5 mg / / kg dose) Follow to resume Xarelto .  Thank you. Olam Monte, PharmD 11/04/2024,8:43 AM      [1]  Allergies Allergen Reactions   Celecoxib  Rash

## 2024-11-05 ENCOUNTER — Inpatient Hospital Stay (HOSPITAL_COMMUNITY)

## 2024-11-05 DIAGNOSIS — N1831 Chronic kidney disease, stage 3a: Secondary | ICD-10-CM | POA: Diagnosis not present

## 2024-11-05 DIAGNOSIS — I350 Nonrheumatic aortic (valve) stenosis: Secondary | ICD-10-CM | POA: Diagnosis not present

## 2024-11-05 DIAGNOSIS — S32059A Unspecified fracture of fifth lumbar vertebra, initial encounter for closed fracture: Secondary | ICD-10-CM | POA: Diagnosis not present

## 2024-11-05 LAB — BPAM RBC
Blood Product Expiration Date: 202601312359
Blood Product Expiration Date: 202602012359
Blood Product Expiration Date: 202602022359
Blood Product Expiration Date: 202602022359
ISSUE DATE / TIME: 202601151754
ISSUE DATE / TIME: 202601151754
Unit Type and Rh: 6200
Unit Type and Rh: 6200
Unit Type and Rh: 6200
Unit Type and Rh: 6200

## 2024-11-05 LAB — TYPE AND SCREEN
ABO/RH(D): A POS
Antibody Screen: NEGATIVE
Unit division: 0
Unit division: 0
Unit division: 0
Unit division: 0

## 2024-11-05 MED ORDER — SMOG ENEMA
960.0000 mL | Freq: Once | RECTAL | Status: AC
  Administered 2024-11-06: 960 mL via RECTAL
  Filled 2024-11-05: qty 960

## 2024-11-05 NOTE — Progress Notes (Signed)
 [facility] PROGRESS NOTE    Progress Note  [name]  FMW:[id number] DOB: June 23, 1946 DOA: 10/31/2024 PCP: Onita Rush, MD     Brief Narrative:   [name] is an 79 y.o. female past medical history significant for PE DVT on Xarelto , history of an IVC filter, multiple back surgeries mild aortic stenosis essential hypertension morbid obesity presents today after mechanical fall CT of the lumbar spine showed Chance fracture of old L4-L5 fusion with significant distraction.  Neurosurgery was consulted who recommended bedrest and a 30 degree angle for pain control and rehab.  They will see in the morning as she may likely need a repeat of her lumbar instrumentation.  Assessment/Plan:   Acute Closed lumbar vertebral fracture [facility]) Surgery was consulted and she status post open reduction of L4-L5 fracture Continue narcotics for pain control, continue bowel regimen. Appreciate neurosurgery's assistance, continue Lovenox  for DVT prophylaxis. Tolerating her diet. Anticipate will need skilled nursing placement.   Neurosurgery recommended bedrest for about a week, CT of lumbar spine has been sent She is hesitant on going to skilled nursing facility.  History of PE/DVT and an IVC filter in place: Last dose of Xarelto  was on 10/30/2024 holding for possible surgical intervention. Continue to hold Xarelto .  Morbid obesity: Hold GLP-1 inhibitor.  Acute kidney injury on chronic kidney disease, stage III (moderate) (HCC)/mild hyperkalemia: KVO Fluids creatinine has returned Baseline. Avoid NSAIDs.  Mild aortic stenosis: Noted.  Hypothyroidism: Continue Synthroid .  Lymphedema of both legs: Likely due to IVC filter.  DVT prophylaxis: lovenox  Family Communication:none Status is: Inpatient Remains inpatient appropriate because: Acute closed fracture of the lumbar vertebral    Code Status:     Code Status Orders  (From admission, onward)           Start      Ordered   10/31/24 2340  Full code  Continuous       Question:  By:  Answer:  Consent: discussion documented in EHR   10/31/24 2340           Code Status History     Date Active Date Inactive Code Status Order ID Comments User Context   10/01/2021 1940 10/10/2021 0106 Full Code [id number]  [name], NP ED   04/17/2021 1218 04/17/2021 2353 Full Code [id number]  [name], MD Inpatient   03/31/2021 1507 04/01/2021 2120 Full Code [id number]  [name], MD Inpatient   03/03/2021 1949 03/04/2021 0341 Full Code [id number]  [name], MD Inpatient   08/18/2018 2202 08/19/2018 2127 Full Code [id number]  [name], MD Inpatient   12/06/2016 1107 12/07/2016 1910 Full Code [id number]  [name] Inpatient   12/07/2013 1836 12/09/2013 1659 Full Code [id number]  Babish, [name], PA-C Inpatient         IV Access:   Peripheral IV   Procedures and diagnostic studies:   No results found.    Medical Consultants:   None.   Subjective:    [name] pain is controlled  Objective:    Vitals:   11/04/24 1541 11/04/24 1935 11/05/24 0329 11/05/24 0841  BP: (!) 168/59 (!) 140/50 (!) 161/56 (!) 168/73  Pulse: 78 67 70 81  Resp: 17 15 16 18   Temp: 97.9 F (36.6 C) 98.8 F (37.1 C) 98.3 F (36.8 C) 98.8 F (37.1 C)  TempSrc:      SpO2: 93% 91% 95% 93%  Weight:      Height:  SpO2: 93 % O2 Flow Rate (L/min): 2 L/min   Intake/Output Summary (Last 24 hours) at 11/05/2024 0916 Last data filed at 11/04/2024 2321 Gross per 24 hour  Intake 240 ml  Output 950 ml  Net -710 ml   Filed Weights   10/31/24 1832 11/02/24 0144  Weight: 124.3 kg (!) 138.1 kg    Exam: General exam: In no acute distress. Respiratory system: Good air movement and clear to auscultation. Cardiovascular system: S1 & S2 heard, RRR. No JVD. Gastrointestinal system: Abdomen is nondistended, soft and nontender.  Extremities: No pedal edema. Skin: No rashes, lesions  or ulcers Psychiatry: Judgement and insight appear normal. Mood & affect appropriate. Data Reviewed:    Labs: Basic Metabolic Panel: Recent Labs  Lab 10/31/24 2025 11/01/24 0429 11/02/24 0508 11/03/24 0408  NA 133* 133* 133* 132*  K 4.9 5.3* 5.2* 4.5  CL 92* 95* 98 98  CO2 27 29 26 27   GLUCOSE 115* 149* 145* 108*  BUN 35* 34* 35* 34*  CREATININE 1.72* 1.52* 1.30* 1.07*  CALCIUM  10.3 9.9 9.8 9.6   GFR Estimated Creatinine Clearance: 60.3 mL/min (A) (by C-G formula based on SCr of 1.07 mg/dL (H)). Liver Function Tests: No results for input(s): AST, ALT, ALKPHOS, BILITOT, PROT, ALBUMIN in the last 168 hours. No results for input(s): LIPASE, AMYLASE in the last 168 hours. No results for input(s): AMMONIA in the last 168 hours. Coagulation profile No results for input(s): INR, PROTIME in the last 168 hours. COVID-19 Labs  No results for input(s): DDIMER, FERRITIN, LDH, CRP in the last 72 hours.  Lab Results  Component Value Date   SARSCOV2NAA NEGATIVE 10/01/2021   SARSCOV2NAA POSITIVE (A) 04/16/2021   SARSCOV2NAA NEGATIVE 03/30/2021   SARSCOV2NAA NEGATIVE 03/02/2021    CBC: Recent Labs  Lab 10/31/24 2025 11/01/24 0429  WBC 10.7* 9.9  HGB 11.4* 10.6*  HCT 35.6* 33.1*  MCV 89.7 89.5  PLT 270 250   Cardiac Enzymes: No results for input(s): CKTOTAL, CKMB, CKMBINDEX, TROPONINI in the last 168 hours. BNP (last 3 results) Recent Labs    07/02/24 1152  PROBNP 331   CBG: No results for input(s): GLUCAP in the last 168 hours. D-Dimer: No results for input(s): DDIMER in the last 72 hours. Hgb A1c: No results for input(s): HGBA1C in the last 72 hours. Lipid Profile: No results for input(s): CHOL, HDL, LDLCALC, TRIG, CHOLHDL, LDLDIRECT in the last 72 hours. Thyroid  function studies: No results for input(s): TSH, T4TOTAL, T3FREE, THYROIDAB in the last 72 hours.  Invalid input(s): FREET3 Anemia work  up: No results for input(s): VITAMINB12, FOLATE, FERRITIN, TIBC, IRON, RETICCTPCT in the last 72 hours. Sepsis Labs: Recent Labs  Lab 10/31/24 2025 11/01/24 0429  WBC 10.7* 9.9   Microbiology Recent Results (from the past 240 hours)  Surgical pcr screen     Status: None   Collection Time: 11/01/24  3:25 PM   Specimen: Nasal Mucosa; Nasal Swab  Result Value Ref Range Status   MRSA, PCR NEGATIVE NEGATIVE Final   Staphylococcus aureus NEGATIVE NEGATIVE Final    Comment: (NOTE) The Xpert SA Assay (FDA approved for NASAL specimens in patients 50 years of age and older), is one component of a comprehensive surveillance program. It is not intended to diagnose infection nor to guide or monitor treatment. Performed at [facility], [address] [place], [place] [address]      Medications:    acidophilus  1 capsule Oral q AM   ALPRAZolam   0.5 mg Oral BID   atenolol   25 mg Oral Daily   Chlorhexidine  Gluconate Cloth  6 each Topical Daily   docusate sodium   100 mg Oral BID   enoxaparin  (LOVENOX ) injection  70 mg Subcutaneous Q24H   escitalopram   5 mg Oral QHS   famotidine   40 mg Oral QHS   gabapentin   600 mg Oral TID   levothyroxine   25 mcg Oral Q M,W,F   pantoprazole   40 mg Oral Daily   senna  1 tablet Oral BID   sodium chloride  flush  3 mL Intravenous Q12H   Continuous Infusions:  sodium chloride         LOS: 5 days   [name]  [facility]  11/05/2024, 9:16 AM

## 2024-11-05 NOTE — Plan of Care (Signed)
  Problem: Education: Goal: Knowledge of General Education information will improve Description: Including pain rating scale, medication(s)/side effects and non-pharmacologic comfort measures Outcome: Progressing   Problem: Health Behavior/Discharge Planning: Goal: Ability to manage health-related needs will improve Outcome: Progressing   Problem: Clinical Measurements: Goal: Ability to maintain clinical measurements within normal limits will improve Outcome: Progressing   Problem: Coping: Goal: Level of anxiety will decrease Outcome: Progressing   Problem: Pain Managment: Goal: General experience of comfort will improve and/or be controlled Outcome: Progressing   Problem: Safety: Goal: Ability to remain free from injury will improve Outcome: Progressing

## 2024-11-05 NOTE — Progress Notes (Signed)
 Patient ID: [name], female   DOB: July 08, 1946, 79 y.o.   MRN: [id number] Patient is c/o back and abdominal pain. No BM since hospitalization. Patient to get CT lumbar this am. Receiving both dilaudid  and percocet and had Toradol  at 6 am.. Await CT.

## 2024-11-05 NOTE — TOC Initial Note (Addendum)
 Transition of Care [facility]) - Initial/Assessment Note    Patient Details  Name: [name] MRN: [id number] Date of Birth: [birth year]  Transition of Care [facility]) CM/SW Contact:    Rosalva Jon Bloch, RN Phone Number: 11/05/2024, 1:07 PM  Clinical Narrative:       Suzan 1/14 with c/o back pain, s/p a fall. Per CT of the lumbar spine pt with Chance fracture of old L4-L5 fusion with significant distraction.   From home with supportive husband. PTA independent with ADL's. Owns 3 RW and a cane. Pt without transportation issues or RX med concern. Currently on BR. PT/OT  evals are pending.  Neurosurgery team following...  NCM spoke with pt regarding d/c planning. Pt states hoping she will d/c to home. Agreeable to home health services if needed. States does not want to go to a SNF.    ICM team following and will assist with needs.  Expected Discharge Plan: Home w Home Health Services Barriers to Discharge: Continued Medical Work up   Patient Goals and CMS Choice            Expected Discharge Plan and Services   Discharge Planning Services: CM Consult   Living arrangements for the past 2 months: Single Family Home                                      Prior Living Arrangements/Services Living arrangements for the past 2 months: Single Family Home   Patient language and need for interpreter reviewed:: Yes Do you feel safe going back to the place where you live?: Yes      Need for Family Participation in Patient Care: Yes (Comment) Care giver support system in place?: Yes (comment) Current home services: DME (cane, 3 RW) Criminal Activity/Legal Involvement Pertinent to Current Situation/Hospitalization: No - Comment as needed  Activities of Daily Living   ADL Screening (condition at time of admission) Independently performs ADLs?: No Does the patient have a NEW difficulty with bathing/dressing/toileting/self-feeding that is expected to last >3 days?: Yes  (Initiates electronic notice to provider for possible OT consult) Does the patient have a NEW difficulty with getting in/out of bed, walking, or climbing stairs that is expected to last >3 days?: Yes (Initiates electronic notice to provider for possible PT consult) Does the patient have a NEW difficulty with communication that is expected to last >3 days?: No Is the patient deaf or have difficulty hearing?: No Does the patient have difficulty seeing, even when wearing glasses/contacts?: No Does the patient have difficulty concentrating, remembering, or making decisions?: No  Permission Sought/Granted                  Emotional Assessment       Orientation: : Oriented to Self, Oriented to Place, Oriented to  Time, Oriented to Situation Alcohol  / Substance Use: Not Applicable Psych Involvement: No (comment)  Admission diagnosis:  Closed lumbar vertebral fracture (HCC) [S32.009A] Other closed fracture of fifth lumbar vertebra, initial encounter [facility]) [S32.058A] Patient Active Problem List   Diagnosis Date Noted   Closed lumbar vertebral fracture (HCC) 10/31/2024   Anemia, unspecified 09/17/2024   Leg edema 07/02/2024   Nonrheumatic aortic valve stenosis 07/02/2024   Iron deficiency anemia due to chronic blood loss 04/11/2023   Deficiency anemia 04/11/2023   Thoracic spine instability 10/01/2021   Thoracic spine pain 04/17/2021   Thoracic radiculopathy 04/17/2021   Compression fracture  of body of thoracic vertebra (HCC) 03/03/2021   Coronary artery disease involving native coronary artery 12/12/2018   Chest pain 08/18/2018   Essential hypertension 08/18/2018   Rupture of quadriceps tendon 12/06/2016   Chronic kidney disease, stage III (moderate) (HCC) 07/27/2016   Hiatal hernia 07/27/2016   Hypothyroidism 07/27/2016   Hyperlipidemia 07/27/2016   History of DVT (deep vein thrombosis) 07/27/2016   Recurrent pulmonary emboli (HCC) 07/27/2016   Chronic anticoagulation  07/27/2016   Chronic lower back pain 07/27/2016   Arthrosis of left acromioclavicular joint 03/31/2015   Biceps tendonitis on left 03/31/2015   Chronic right shoulder pain 03/31/2015   Left shoulder pain 03/31/2015   S/P left hip revision 12/07/2013   Visit for suture removal 09/25/2013   Aneurysm of unspecified site 08/28/2013   PCP:  Onita Rush, MD Pharmacy:   [id number] - [place], [place] - [address] [facility] [address] [place] [address] Phone: [phone] Fax: [phone]     Social Drivers of Health (SDOH) Social History: SDOH Screenings   Food Insecurity: No Food Insecurity (11/01/2024)  Housing: Low Risk (11/01/2024)  Transportation Needs: No Transportation Needs (11/01/2024)  Utilities: Not At Risk (11/01/2024)  Social Connections: Socially Integrated (11/01/2024)  Tobacco Use: Low Risk (11/01/2024)   SDOH Interventions:     Readmission Risk Interventions     No data to display

## 2024-11-06 DIAGNOSIS — N1831 Chronic kidney disease, stage 3a: Secondary | ICD-10-CM | POA: Diagnosis not present

## 2024-11-06 DIAGNOSIS — I350 Nonrheumatic aortic (valve) stenosis: Secondary | ICD-10-CM | POA: Diagnosis not present

## 2024-11-06 DIAGNOSIS — S32059A Unspecified fracture of fifth lumbar vertebra, initial encounter for closed fracture: Secondary | ICD-10-CM | POA: Diagnosis not present

## 2024-11-06 LAB — CBC
HCT: 24 % — ABNORMAL LOW (ref 36.0–46.0)
Hemoglobin: 7.9 g/dL — ABNORMAL LOW (ref 12.0–15.0)
MCH: 29 pg (ref 26.0–34.0)
MCHC: 32.9 g/dL (ref 30.0–36.0)
MCV: 88.2 fL (ref 80.0–100.0)
Platelets: 293 K/uL (ref 150–400)
RBC: 2.72 MIL/uL — ABNORMAL LOW (ref 3.87–5.11)
RDW: 15.5 % (ref 11.5–15.5)
WBC: 9.3 K/uL (ref 4.0–10.5)
nRBC: 0 % (ref 0.0–0.2)

## 2024-11-06 MED ORDER — CEPHALEXIN 500 MG PO CAPS
500.0000 mg | ORAL_CAPSULE | Freq: Three times a day (TID) | ORAL | Status: DC
  Administered 2024-11-06 – 2024-11-09 (×10): 500 mg via ORAL
  Filled 2024-11-06 (×10): qty 1

## 2024-11-06 NOTE — Progress Notes (Addendum)
 Patient ID: [name], female   DOB: 1946-06-20, 79 y.o.   MRN: [id number] Vital signs are stable Patient's CT scan was reviewed and this shows that bone graft and posterior fixation is in good position.  Overall I am very pleased with the surgical result.  I have advised that she should stay at bedrest for a week and I believe on Thursday we can start to mobilize her.  In the meantime I would like her to continue  antibiotic po.

## 2024-11-06 NOTE — Progress Notes (Signed)
 OT Cancellation Note  Patient Details Name: [name] MRN: [id number] DOB: 09/23/1946   Cancelled Treatment:    Reason Eval/Treat Not Completed: Active bedrest order (until 1/22 per Dr Colon)  NEOMI MAIZE 11/06/2024, 11:01 AM Kreg Neomi, OT/L   Acute OT Clinical Specialist Acute Rehabilitation Services Pager [phone] Office [phone]

## 2024-11-06 NOTE — Progress Notes (Signed)
 Pt had small BM earlier, reports relief, denied abdominal discomfort at this time.

## 2024-11-06 NOTE — Progress Notes (Addendum)
 [facility] PROGRESS NOTE    Progress Note  [name]  FMW:[id number] DOB: [birth year] DOA: 10/31/2024 PCP: Onita Rush, MD     Brief Narrative:   [name] is an 79 y.o. female past medical history significant for PE DVT on Xarelto , history of an IVC filter, multiple back surgeries mild aortic stenosis essential hypertension morbid obesity presents today after mechanical fall CT of the lumbar spine showed Chance fracture of old L4-L5 fusion with significant distraction.  Neurosurgery was consulted who recommended bedrest and a 30 degree angle for pain control and rehab.  They will see in the morning as she may likely need a repeat of her lumbar instrumentation.  Assessment/Plan:   Acute Closed lumbar vertebral fracture [facility]) Surgery was consulted and she status post open reduction of L4-L5 fracture Continue narcotics for pain control, continue bowel regimen.  Patient had multiple bowel movements. Appreciate neurosurgery's assistance, Lovenox  was held due to retroperitoneal bleed appreciated on CT. CT abdomen and pelvis showed surgical changes and regional retroperitoneal hematoma. Anticipate will need skilled nursing placement.   She relates her pain is controlled. Awaiting neurosurgery further recommendations. She does not want to go to a rehab facility she would like to go home with physical therapy.  Small retroperitoneal hematoma: Appreciated on CT. Hold all anticoagulation and antiplatelet therapy.  History of PE/DVT and an IVC filter in place: Last dose of Xarelto  was on 10/30/2024 holding for possible surgical intervention. Continue to hold Xarelto . CBC is pending this morning.  Morbid obesity: Hold GLP-1 inhibitor.  Acute kidney injury on chronic kidney disease, stage III (moderate) (HCC)/mild hyperkalemia: KVO Fluids creatinine has returned Baseline. Avoid NSAIDs.  Mild aortic stenosis: Noted.  Hypothyroidism: Continue Synthroid .  Lymphedema of  both legs: Likely due to IVC filter.  DVT prophylaxis: lovenox  Family Communication:none Status is: Inpatient Remains inpatient appropriate because: Acute closed fracture of the lumbar vertebral    Code Status:     Code Status Orders  (From admission, onward)           Start     Ordered   10/31/24 2340  Full code  Continuous       Question:  By:  Answer:  Consent: discussion documented in EHR   10/31/24 2340           Code Status History     Date Active Date Inactive Code Status Order ID Comments User Context   10/01/2021 1940 10/10/2021 0106 Full Code [id number]  [name], NP ED   04/17/2021 1218 04/17/2021 2353 Full Code [id number]  [name], MD Inpatient   03/31/2021 1507 04/01/2021 2120 Full Code [id number]  [name], MD Inpatient   03/03/2021 1949 03/04/2021 0341 Full Code [id number]  [name], MD Inpatient   08/18/2018 2202 08/19/2018 2127 Full Code [id number]  [name], MD Inpatient   12/06/2016 1107 12/07/2016 1910 Full Code [id number]  [name] Inpatient   12/07/2013 1836 12/09/2013 1659 Full Code [id number]  Babish, [name], PA-C Inpatient         IV Access:   Peripheral IV   Procedures and diagnostic studies:   CT LUMBAR SPINE WO CONTRAST Result Date: 11/05/2024 EXAM: CT OF THE LUMBAR SPINE WITHOUT CONTRAST 11/05/2024 11:11:08 AM TECHNIQUE: CT of the lumbar spine was performed without the administration of intravenous contrast. Multiplanar reformatted images are provided for review. Automated exposure control, iterative reconstruction, and/or weight based adjustment of the mA/kV was utilized to reduce the radiation dose to as  low as reasonably achievable. COMPARISON: CT Lumbar spine 10/31/2024. CLINICAL HISTORY: Low back pain, chronic. FINDINGS: BONES AND ALIGNMENT: Transitional lumbosacral anatomy with the transitional segment considered a partially lumbarized S1 for consistency with the previous CT report. Mild  lumbar dextroscoliosis. Recent hyperextension injury at L5-S1 with chance type fracture extending through the previously fused posterior elements and distraction of the L5-S1 disc space measuring up to 2.6 cm anteriorly, similar to the prior CT. There has been interval placement of pedicle screws and interconnecting rods bilaterally at L5-S1, and graft material has been placed within the distracted L5-S1 disc space. Old pedicle screw tracks are again seen at L4 and S2. A preexisting posterior fusion construct remains in place from T11 to L2. Posterior element osseous fusion is solid extending from the included lower portion of the thoracic spine to L5. There is also solid interbody ankylosis throughout with the exception of L1-L2 where there is less extensive bridging bone across the peripheral aspects of the disc space. No new fracture or suspicious lesion is identified. DEGENERATIVE CHANGES: Extensive postoperative changes as above with multilevel posterior decompression. No evidence of high-grade osseous spinal canal stenosis. SOFT TISSUES: Postoperative changes in the paraspinal soft tissues. Paravertebral hematoma at L5-S1 with partially visualized retroperitoneal hematoma extending inferiorly and laterally in the pelvis asymmetric to the right, overall increased from the prior CT. Abdominal aortic atherosclerosis without aneurysm. IVC filter. Cholecystectomy. IMPRESSION: 1. Recent hyperextension injury at L5-S1 status post interval posterior fusion. Unchanged anterior distraction of the L5-S1 disc space. Regional postoperative changes and retroperitoneal hematoma. Electronically signed by: Dasie Hamburg MD 11/05/2024 12:16 PM EST RP Workstation: [id number]      Medical Consultants:   None.   Subjective:    [name] pain is controlled.  Objective:    Vitals:   11/05/24 2124 11/06/24 0412 11/06/24 0430 11/06/24 0740  BP: (!) 163/60 (!) 172/78 (!) 148/84 (!) 153/53  Pulse: 73 79  71   Resp: 18 16  18   Temp:  99.1 F (37.3 C)  99.2 F (37.3 C)  TempSrc:      SpO2: 95% 94%  96%  Weight:      Height:       SpO2: 96 % O2 Flow Rate (L/min): 2 L/min   Intake/Output Summary (Last 24 hours) at 11/06/2024 0841 Last data filed at 11/06/2024 0413 Gross per 24 hour  Intake 240 ml  Output 1800 ml  Net -1560 ml   Filed Weights   10/31/24 1832 11/02/24 0144  Weight: 124.3 kg (!) 138.1 kg    Exam: General exam: In no acute distress. Respiratory system: Good air movement and clear to auscultation. Cardiovascular system: S1 & S2 heard, RRR. No JVD. Gastrointestinal system: Abdomen is nondistended, soft and nontender.  Extremities: No pedal edema. Skin: No rashes, lesions or ulcers Psychiatry: Judgement and insight appear normal. Mood & affect appropriate. Data Reviewed:    Labs: Basic Metabolic Panel: Recent Labs  Lab 10/31/24 2025 11/01/24 0429 11/02/24 0508 11/03/24 0408  NA 133* 133* 133* 132*  K 4.9 5.3* 5.2* 4.5  CL 92* 95* 98 98  CO2 27 29 26 27   GLUCOSE 115* 149* 145* 108*  BUN 35* 34* 35* 34*  CREATININE 1.72* 1.52* 1.30* 1.07*  CALCIUM  10.3 9.9 9.8 9.6   GFR Estimated Creatinine Clearance: 60.3 mL/min (A) (by C-G formula based on SCr of 1.07 mg/dL (H)). Liver Function Tests: No results for input(s): AST, ALT, ALKPHOS, BILITOT, PROT, ALBUMIN in the last 168 hours. No  results for input(s): LIPASE, AMYLASE in the last 168 hours. No results for input(s): AMMONIA in the last 168 hours. Coagulation profile No results for input(s): INR, PROTIME in the last 168 hours. COVID-19 Labs  No results for input(s): DDIMER, FERRITIN, LDH, CRP in the last 72 hours.  Lab Results  Component Value Date   SARSCOV2NAA NEGATIVE 10/01/2021   SARSCOV2NAA POSITIVE (A) 04/16/2021   SARSCOV2NAA NEGATIVE 03/30/2021   SARSCOV2NAA NEGATIVE 03/02/2021    CBC: Recent Labs  Lab 10/31/24 2025 11/01/24 0429  WBC 10.7* 9.9  HGB 11.4*  10.6*  HCT 35.6* 33.1*  MCV 89.7 89.5  PLT 270 250   Cardiac Enzymes: No results for input(s): CKTOTAL, CKMB, CKMBINDEX, TROPONINI in the last 168 hours. BNP (last 3 results) Recent Labs    07/02/24 1152  PROBNP 331   CBG: No results for input(s): GLUCAP in the last 168 hours. D-Dimer: No results for input(s): DDIMER in the last 72 hours. Hgb A1c: No results for input(s): HGBA1C in the last 72 hours. Lipid Profile: No results for input(s): CHOL, HDL, LDLCALC, TRIG, CHOLHDL, LDLDIRECT in the last 72 hours. Thyroid  function studies: No results for input(s): TSH, T4TOTAL, T3FREE, THYROIDAB in the last 72 hours.  Invalid input(s): FREET3 Anemia work up: No results for input(s): VITAMINB12, FOLATE, FERRITIN, TIBC, IRON, RETICCTPCT in the last 72 hours. Sepsis Labs: Recent Labs  Lab 10/31/24 2025 11/01/24 0429  WBC 10.7* 9.9   Microbiology Recent Results (from the past 240 hours)  Surgical pcr screen     Status: None   Collection Time: 11/01/24  3:25 PM   Specimen: Nasal Mucosa; Nasal Swab  Result Value Ref Range Status   MRSA, PCR NEGATIVE NEGATIVE Final   Staphylococcus aureus NEGATIVE NEGATIVE Final    Comment: (NOTE) The Xpert SA Assay (FDA approved for NASAL specimens in patients 62 years of age and older), is one component of a comprehensive surveillance program. It is not intended to diagnose infection nor to guide or monitor treatment. Performed at [facility], [address] [place], [place] [address]      Medications:    acidophilus  1 capsule Oral q AM   ALPRAZolam   0.5 mg Oral BID   atenolol   25 mg Oral Daily   Chlorhexidine  Gluconate Cloth  6 each Topical Daily   docusate sodium   100 mg Oral BID   escitalopram   5 mg Oral QHS   famotidine   40 mg Oral QHS   gabapentin   600 mg Oral TID   levothyroxine   25 mcg Oral Q M,W,F   pantoprazole   40 mg Oral Daily   senna  1 tablet Oral BID   sodium  chloride flush  3 mL Intravenous Q12H   Continuous Infusions:  sodium chloride         LOS: 6 days   Erle Odell Castor  [facility]  11/06/2024, 8:41 AM

## 2024-11-06 NOTE — Plan of Care (Signed)

## 2024-11-07 DIAGNOSIS — S32059D Unspecified fracture of fifth lumbar vertebra, subsequent encounter for fracture with routine healing: Secondary | ICD-10-CM

## 2024-11-07 DIAGNOSIS — I2699 Other pulmonary embolism without acute cor pulmonale: Secondary | ICD-10-CM | POA: Diagnosis not present

## 2024-11-07 DIAGNOSIS — N1831 Chronic kidney disease, stage 3a: Secondary | ICD-10-CM | POA: Diagnosis not present

## 2024-11-07 DIAGNOSIS — Z7901 Long term (current) use of anticoagulants: Secondary | ICD-10-CM | POA: Diagnosis not present

## 2024-11-07 LAB — CBC WITH DIFFERENTIAL/PLATELET
Abs Immature Granulocytes: 0.04 K/uL (ref 0.00–0.07)
Basophils Absolute: 0 K/uL (ref 0.0–0.1)
Basophils Relative: 0 %
Eosinophils Absolute: 0.3 K/uL (ref 0.0–0.5)
Eosinophils Relative: 3 %
HCT: 24.1 % — ABNORMAL LOW (ref 36.0–46.0)
Hemoglobin: 7.8 g/dL — ABNORMAL LOW (ref 12.0–15.0)
Immature Granulocytes: 0 %
Lymphocytes Relative: 8 %
Lymphs Abs: 0.8 K/uL (ref 0.7–4.0)
MCH: 28.6 pg (ref 26.0–34.0)
MCHC: 32.4 g/dL (ref 30.0–36.0)
MCV: 88.3 fL (ref 80.0–100.0)
Monocytes Absolute: 1.3 K/uL — ABNORMAL HIGH (ref 0.1–1.0)
Monocytes Relative: 14 %
Neutro Abs: 7.1 K/uL (ref 1.7–7.7)
Neutrophils Relative %: 75 %
Platelets: 310 K/uL (ref 150–400)
RBC: 2.73 MIL/uL — ABNORMAL LOW (ref 3.87–5.11)
RDW: 15.2 % (ref 11.5–15.5)
WBC: 9.5 K/uL (ref 4.0–10.5)
nRBC: 0 % (ref 0.0–0.2)

## 2024-11-07 LAB — BASIC METABOLIC PANEL WITH GFR
Anion gap: 7 (ref 5–15)
BUN: 21 mg/dL (ref 8–23)
CO2: 28 mmol/L (ref 22–32)
Calcium: 9.5 mg/dL (ref 8.9–10.3)
Chloride: 99 mmol/L (ref 98–111)
Creatinine, Ser: 0.8 mg/dL (ref 0.44–1.00)
GFR, Estimated: >60 mL/min
Glucose, Bld: 101 mg/dL — ABNORMAL HIGH (ref 70–99)
Potassium: 4.6 mmol/L (ref 3.5–5.1)
Sodium: 134 mmol/L — ABNORMAL LOW (ref 135–145)

## 2024-11-07 MED ORDER — ALPRAZOLAM 0.25 MG PO TABS
0.2500 mg | ORAL_TABLET | Freq: Two times a day (BID) | ORAL | Status: DC
  Administered 2024-11-07 – 2024-11-11 (×8): 0.25 mg via ORAL
  Filled 2024-11-07 (×8): qty 1

## 2024-11-07 MED ORDER — OXYCODONE HCL 5 MG PO TABS
5.0000 mg | ORAL_TABLET | ORAL | Status: DC | PRN
  Administered 2024-11-07: 5 mg via ORAL
  Administered 2024-11-08: 10 mg via ORAL
  Filled 2024-11-07: qty 1
  Filled 2024-11-07 (×3): qty 2

## 2024-11-07 MED FILL — Heparin Sodium (Porcine) Inj 1000 Unit/ML: INTRAMUSCULAR | Qty: 30 | Status: AC

## 2024-11-07 MED FILL — Sodium Chloride IV Soln 0.9%: INTRAVENOUS | Qty: 2000 | Status: AC

## 2024-11-07 NOTE — Progress Notes (Signed)
 SPO2 80% on room air at nigh time. 2 LPM of NCL was given. Able to maintain SPO2 97%. Pt has normal respiratory effort. We will monitor.   Wendi Dash, RN

## 2024-11-07 NOTE — Plan of Care (Incomplete)
   Problem: Health Behavior/Discharge Planning: Goal: Ability to manage health-related needs will improve Outcome: Progressing

## 2024-11-07 NOTE — Evaluation (Signed)
 Physical Therapy Evaluation Patient Details Name: [name] MRN: [id number] DOB: Jan 18, 1946 Today's Date: 11/07/2024  History of Present Illness  Pt is a 79 y.o. F presenting to [facility] ED following a fall at home. IMG shows L4-L5 fx with significant distraction. Pt is s/p L4-L5 ORIF. Pt recently admitted 10/01/21 for a fall sustaining T7-T8 fx dislocation and is s/p posterior fixation of T6-T9. PMH is significant for T11 kyphoplasty, DVT, and PE.   Clinical Impression  Prior to admittance, pt was mobilizing modified independently, utilizing rollator for mobility, and is indep with ADLs. Pt presents to evaluation with deficits in mobility, strength, power, activity tolerance, pain, and balance. Pt was able to perform bed mobility with maximal physical assistance of 2 and demonstrates good understanding of spinal precautions, recalling 3/3 when prompted and adhering throughout session. Pt tolerated sitting EOB with BUE supported on surface with moderate-minimal physical assistance with pt pushing posterior. PT will continue to treat pt while she is admitted. Patient will benefit from intensive inpatient follow-up therapy, >3 hours/day.       If plan is discharge home, recommend the following: Two people to help with walking and/or transfers;Two people to help with bathing/dressing/bathroom;Assistance with cooking/housework;Assist for transportation;Help with stairs or ramp for entrance   Can travel by private vehicle        Equipment Recommendations [place] lift;Wheelchair cushion (measurements PT);Wheelchair (measurements PT);BSC/3in1  Recommendations for Other Services  Rehab consult    Functional Status Assessment Patient has had a recent decline in their functional status and demonstrates the ability to make significant improvements in function in a reasonable and predictable amount of time.     Precautions / Restrictions Precautions Precautions: Back;Fall Precaution Booklet Issued: Yes  (comment) Recall of Precautions/Restrictions: Intact Precaution/Restrictions Comments: pt able to recall 3/3 spinal precautions and maintain them throughout session Restrictions Weight Bearing Restrictions Per Provider Order: No      Mobility  Bed Mobility Overal bed mobility: Needs Assistance Bed Mobility: Rolling, Sidelying to Sit, Sit to Sidelying Rolling: Max assist Sidelying to sit: Max assist, +2 for physical assistance     Sit to sidelying: Max assist, +2 for physical assistance General bed mobility comments: Pt able to reach across body and place LUE on R bed railing with physical assistance for trunk and BLE management to perform roll; pt unable to bed knees, but is able to keep them adducted together. Pt requires physical assistance for advancing BLEs and is able to forearm prop on RUE and initiate sidelying to sit but requires physical assistance for trunk management. For sit to sidelying, pt requires physical assistance for bringing BLEs in bed and then to complete roll back to supine. Step by step cues for sequencing; increased time to complete.    Transfers                   General transfer comment: deferred at this time    Ambulation/Gait                  Stairs            Wheelchair Mobility     Tilt Bed    Modified Rankin (Stroke Patients Only)       Balance Overall balance assessment: Needs assistance Sitting-balance support: Bilateral upper extremity supported, Feet supported Sitting balance-Leahy Scale: Poor Sitting balance - Comments: Pt able to tolerate sitting EOB for < 1 min without UE or physical support from therapist but fatigues quickly, leaning posteriorly, requiring maximal physical  assistance to remain upright. Postural control: Posterior lean                                   Pertinent Vitals/Pain Pain Assessment Pain Assessment: 0-10 Pain Score: 7  Pain Location: low back Pain Descriptors /  Indicators: Aching, Constant, Discomfort, Grimacing, Guarding, Moaning, Spasm Pain Intervention(s): Limited activity within patient's tolerance, Monitored during session, Patient requesting pain meds-RN notified    Home Living Family/patient expects to be discharged to:: Private residence Living Arrangements: Spouse/significant other Available Help at Discharge: Family;Available 24 hours/day Type of Home: House Home Access: Stairs to enter Entrance Stairs-Rails: Doctor, General Practice of Steps: 3   Home Layout: One level Home Equipment: Rollator (4 wheels);Rolling Walker (2 wheels);Cane - quad;Shower seat      Prior Function Prior Level of Function : Independent/Modified Independent;History of Falls (last six months) (2 falls, both falling backwards and was not using her AD)             Mobility Comments: tends to use the rollator on the day to day but occasionally will furniture surf. pt also sleeps in lift chair ADLs Comments: indep     Extremity/Trunk Assessment   Upper Extremity Assessment Upper Extremity Assessment: Defer to OT evaluation    Lower Extremity Assessment Lower Extremity Assessment: Generalized weakness (Pt with difficulty performing knee flexion and hip flexion, grossly 2/5 bilaterally)    Cervical / Trunk Assessment Cervical / Trunk Assessment: Kyphotic;Back Surgery  Communication   Communication Communication: No apparent difficulties    Cognition Arousal: Alert Behavior During Therapy: WFL for tasks assessed/performed   PT - Cognitive impairments: No apparent impairments                         Following commands: Intact       Cueing Cueing Techniques: Verbal cues, Gestural cues, Tactile cues, Visual cues     General Comments General comments (skin integrity, edema, etc.): SpO2 94% on 2L    Exercises     Assessment/Plan    PT Assessment Patient needs continued PT services  PT Problem List Decreased  strength;Decreased range of motion;Decreased activity tolerance;Decreased balance;Decreased mobility;Pain       PT Treatment Interventions DME instruction;Gait training;Functional mobility training;Therapeutic activities;Therapeutic exercise;Balance training;Neuromuscular re-education;Patient/family education;Wheelchair mobility training;Manual techniques;Modalities    PT Goals (Current goals can be found in the Care Plan section)  Acute Rehab PT Goals Patient Stated Goal: to get stronger PT Goal Formulation: With patient/family Time For Goal Achievement: 11/21/24 Potential to Achieve Goals: Good Additional Goals Additional Goal #1: pt will demonstrate understanding of spinal precautions requiring minimal cueing for adherance throughout session    Frequency Min 3X/week     Co-evaluation               AM-PAC PT 6 Clicks Mobility  Outcome Measure Help needed turning from your back to your side while in a flat bed without using bedrails?: A Lot Help needed moving from lying on your back to sitting on the side of a flat bed without using bedrails?: A Lot Help needed moving to and from a bed to a chair (including a wheelchair)?: Total Help needed standing up from a chair using your arms (e.g., wheelchair or bedside chair)?: Total Help needed to walk in hospital room?: Total Help needed climbing 3-5 steps with a railing? : Total 6 Click Score: 8  End of Session Equipment Utilized During Treatment: Oxygen Activity Tolerance: Patient tolerated treatment well Patient left: in bed;with call bell/phone within reach;with bed alarm set;with family/visitor present Nurse Communication: Mobility status PT Visit Diagnosis: Other abnormalities of gait and mobility (R26.89);Muscle weakness (generalized) (M62.81);Pain    Time: 1355-1433 PT Time Calculation (min) (ACUTE ONLY): 38 min   Charges:   PT Evaluation $PT Eval Moderate Complexity: 1 Mod PT Treatments $Therapeutic Activity:  8-22 mins PT General Charges $$ ACUTE PT VISIT: 1 Visit         Leontine Hilt DPT Acute Rehab Services [phone] Prefer contact via chat   Leontine NOVAK Jaqua Ching 11/07/2024, 4:34 PM

## 2024-11-07 NOTE — Progress Notes (Signed)
 Cyst/black head-like lump behind patients right ear. Approx the size of a dime. Family said pt has had this since about 79 years old but it has never been this size. MD Cherlyn made aware per family request.

## 2024-11-07 NOTE — Progress Notes (Signed)
 "        [facility]                                                                               [name], is a 79 y.o. female, DOB - [birth year], FMW:[id number] Admit date - 10/31/2024    Outpatient Primary MD for the patient is Onita Rush, MD  LOS - 7  days    Brief summary   [name] is an 79 y.o. female past medical history significant for PE DVT on Xarelto , history of an IVC filter, multiple back surgeries mild aortic stenosis essential hypertension morbid obesity presents today after mechanical fall CT of the lumbar spine showed Chance fracture of old L4-L5 fusion with significant distraction.  Neurosurgery was consulted who recommended bedrest and a 30 degree angle for pain control and rehab.    Assessment & Plan    Assessment and Plan:   Acute close lumbar fracture S/p open reduction at the L4-L5. Repeat CT of the lumbar spine without contrast shows Recent hyperextension injury at L5-S1 status post interval posterior fusion. Unchanged anterior distraction of the L5-S1 disc space. Regional postoperative changes and retroperitoneal hematoma. Neurosurgery on board recommending therapy evaluations at this time Pain controlled   AKI and hyperkalemia Resolved   Acute anemia from blood loss  Secondary to retroperitoneal hematoma. Continue to monitor hemoglobin and transfuse to keep it greater than 7    History of PE/DVT and an IVC filter in place Need to hold Xarelto  and any kind of anticoagulation due to retroperitoneal bleed and a drop of hemoglobin from 10-7.8.   Body mass index is 52.26 kg/m. Morbid obesity Poor prognostic factor   Hypothyroidism Continue Synthroid   Lymphedema Status post IVC filter    Estimated body mass index is 52.26 kg/m as calculated from the following:   Height as of this encounter: 5' 4 (1.626 m).   Weight as of this encounter: 138.1 kg.  Code Status: Full code DVT Prophylaxis:  Place and maintain  sequential compression device Start: 11/06/24 0848 SCD's Start: 11/01/24 2359   Level of Care: Level of care: Med-Surg Family Communication: None at bedside  Disposition Plan:     Remains inpatient appropriate:  pending.    Procedures:  S/p lumbar fusion surgery.   Consultants:   NS   Antimicrobials:   Anti-infectives (From admission, onward)    Start     Dose/Rate Route Frequency Ordered Stop   11/06/24 1400  cephALEXin  (KEFLEX ) capsule 500 mg        500 mg Oral Every 8 hours 11/06/24 1036     11/02/24 0045  ceFAZolin  (ANCEF ) IVPB 2g/100 mL premix        2 g 200 mL/hr over 30 Minutes Intravenous Every 8 hours 11/01/24 2358 11/02/24 0930        Medications  Scheduled Meds:  acidophilus  1 capsule Oral q AM   ALPRAZolam   0.5 mg Oral BID   atenolol   25 mg Oral Daily   cephALEXin   500 mg Oral Q8H   Chlorhexidine  Gluconate Cloth  6 each Topical Daily   docusate sodium   100 mg Oral BID   escitalopram   5 mg  Oral QHS   famotidine   40 mg Oral QHS   gabapentin   600 mg Oral TID   levothyroxine   25 mcg Oral Q M,W,F   pantoprazole   40 mg Oral Daily   senna  1 tablet Oral BID   sodium chloride  flush  3 mL Intravenous Q12H   Continuous Infusions:  sodium chloride      PRN Meds:.acetaminophen  **OR** acetaminophen , alum & mag hydroxide-simeth, atenolol  **AND** atenolol , bisacodyl , bisacodyl , HYDROmorphone  (DILAUDID ) injection, ketorolac , melatonin, menthol  **OR** phenol, methocarbamol  **OR** methocarbamol  (ROBAXIN ) injection, ondansetron  **OR** ondansetron  (ZOFRAN ) IV, oxyCODONE , polyethylene glycol, sodium chloride  flush, sodium phosphate     Subjective:   [name] was seen and examined today.  Pain is controlled.  Wants to get out of bed.  No chest pain or sob. No BM today.   Objective:   Vitals:   11/06/24 2002 11/07/24 0329 11/07/24 0358 11/07/24 0746  BP: (!) 134/52 (!) 150/56  (!) 144/50  Pulse: 69 74  64  Resp: 17 16  20   Temp: 98.7 F (37.1 C) 98.7 F  (37.1 C)  98.3 F (36.8 C)  TempSrc:  Oral    SpO2: 91% (!) 80% 97% 100%  Weight:      Height:        Intake/Output Summary (Last 24 hours) at 11/07/2024 1320 Last data filed at 11/07/2024 1206 Gross per 24 hour  Intake 370 ml  Output 1600 ml  Net -1230 ml   Filed Weights   10/31/24 1832 11/02/24 0144  Weight: 124.3 kg (!) 138.1 kg     Exam General: Alert and oriented x 3, NAD Cardiovascular: S1 S2 auscultated, no murmurs, RRR Respiratory: Clear to auscultation bilaterally, no wheezing, rales or rhonchi Gastrointestinal: Soft, nontender, nondistended, + bowel sounds Ext:  chronic pedal edema bilaterally Neuro: AAOx3,    Data Reviewed:  I have personally reviewed following labs and imaging studies   CBC Lab Results  Component Value Date   WBC 9.5 11/07/2024   RBC 2.73 (L) 11/07/2024   HGB 7.8 (L) 11/07/2024   HCT 24.1 (L) 11/07/2024   MCV 88.3 11/07/2024   MCH 28.6 11/07/2024   PLT 310 11/07/2024   MCHC 32.4 11/07/2024   RDW 15.2 11/07/2024   LYMPHSABS 0.8 11/07/2024   MONOABS 1.3 (H) 11/07/2024   EOSABS 0.3 11/07/2024   BASOSABS 0.0 11/07/2024     Last metabolic panel Lab Results  Component Value Date   NA 134 (L) 11/07/2024   K 4.6 11/07/2024   CL 99 11/07/2024   CO2 28 11/07/2024   BUN 21 11/07/2024   CREATININE 0.80 11/07/2024   GLUCOSE 101 (H) 11/07/2024   GFRNONAA >60 11/07/2024   GFRAA >60 08/19/2018   CALCIUM  9.5 11/07/2024   PROT 6.8 10/01/2021   ALBUMIN 3.8 10/01/2021   BILITOT 0.6 10/01/2021   ALKPHOS 52 10/01/2021   AST 27 10/01/2021   ALT 16 10/01/2021   ANIONGAP 7 11/07/2024    CBG (last 3)  No results for input(s): GLUCAP in the last 72 hours.    Coagulation Profile: No results for input(s): INR, PROTIME in the last 168 hours.   Radiology Studies: No results found.     Elgie Butter M.D. [facility] 11/07/2024, 1:20 PM  Available via Epic secure chat 7am-7pm After 7 pm, please refer to night coverage  provider listed on amion.    "

## 2024-11-07 NOTE — Plan of Care (Signed)
 Pt is alert an fully oriented x 4, afebrile, stable hemodynamically, Normal respiratory effort, no distress noted. Pain is tolerated. Pt is able to rest well. Plan of care is reviewed. Pt has been progressing. We will monitor.  Problem: Clinical Measurements: Goal: Ability to maintain clinical measurements within normal limits will improve Outcome: Progressing Goal: Will remain free from infection Outcome: Progressing Goal: Diagnostic test results will improve Outcome: Progressing Goal: Respiratory complications will improve Outcome: Progressing Goal: Cardiovascular complication will be avoided Outcome: Progressing   Problem: Elimination: Goal: Will not experience complications related to bowel motility Outcome: Progressing Goal: Will not experience complications related to urinary retention Outcome: Progressing   Problem: Pain Managment: Goal: General experience of comfort will improve and/or be controlled Outcome: Progressing   Problem: Skin Integrity: Goal: Risk for impaired skin integrity will decrease Outcome: Progressing   Wendi Dash, RN

## 2024-11-07 NOTE — PMR Pre-admission (Shared)
 PMR Admission Coordinator Pre-Admission Assessment  Patient: [name] is an 79 y.o., female MRN: [id number] DOB: 05-08-1946 Height: 5' 4 (162.6 cm) Weight: (!) 138.1 kg  Insurance Information HMO: ***    PPO: ***     PCP: ***     IPA: ***     80/20: ***     OTHER: *** PRIMARY: [employer] PPO      Policy#: [id number]      Subscriber: self CM Name: ***      Phone#: ***     Fax#: *** Pre-Cert#: ***      Employer: Retired Benefits:  Phone #: [phone]     Name: PIERRETTE Gemma. Date: ***     Deduct: ***      Out of Pocket Max: ***      Life Max: *** CIR: ***      SNF: *** Outpatient: ***     Co-Pay: *** Home Health: ***      Co-Pay: *** DME: ***     Co-Pay: *** Providers: in network  SECONDARY:       Policy#:      Phone#:   Artist:       Phone#:   The [employer] for patients in Inpatient Rehabilitation Facilities with attached Privacy Act Statement-Health Care Records was provided and verbally reviewed with: Patient  Emergency Contact Information Contact Information     Name Relation Home Work Mobile   [name] [phone]  [phone]   [name], [name] [phone]  [phone]   [name], [name] [phone]  [phone]   [name], [name] [phone]     [name], [name] Daughter   [phone]      Other Contacts   None on File     Current Medical History  Patient Admitting Diagnosis: L4-5 fixation/fusion  History of Present Illness:  a 79 year old individual who is well-known to my practice.  She has had extensive lumbar spinal fusion surgery from the sacrum to the thoracolumbar junction and beyond.  This was done over a number of procedures over many years as she developed progressive adjacent level disease.  Patient had hardware from L3 to the sacrum in the past but this had been removed as her disease progressed cephalad.   The patient had a ground-level fall while working in her kitchen and  fell hard onto her buttocks.  She had immediate and severe pain in her low back.  This is persisted.  She was brought into [facility] emergency room on 11/01/23 where CT scan demonstrated a fracture dislocation through her old fusion at L4-L5.  She has splaying of the ventral aspect of the intervertebral space at L4-L5.  The fracture is consistent with a Chance fracture and all 3 columns the spine are involved.  Patient underwent an open reduction and posterior fixation of L4-L5 fracture dislocation with pedicle screw fixation L4-L5 and allograft arthrodesis on 11/01/24 by Dr. Colon. ***  Patient's medical record from [facility] has been reviewed by the rehabilitation admission coordinator and physician.  Past Medical History  Past Medical History:  Diagnosis Date   Anemia    Arthritis    osteoarthritis   Claustrophobia    Complication of anesthesia    hard time waking up after her gallbladder surgery 28 years ago   DVT (deep venous thrombosis) (HCC)    left leg   GERD (gastroesophageal reflux disease)    hx of   Heart murmur    Dr. Claudene follows, 'nothing  to worry about.'   History of blood transfusion    History of hiatal hernia    small   History of kidney stones    Hypercholesteremia    under control   Hypertension    Hypothyroidism    Mild aortic stenosis    a. by echo 12/2017.   Panic attack    PONV (postoperative nausea and vomiting)    Pulmonary embolism (HCC)    bilateral lungs - 2008 after disc surgery    Has the patient had major surgery during 100 days prior to admission? Yes  Family History   family history includes Deep vein thrombosis in her brother; Diabetes in her father, mother, and sister; Heart attack in her father; Heart disease in her mother; Hyperlipidemia in her brother, father, mother, and sister; Hypertension in her brother, father, mother, and sister; Other in her father; Peripheral vascular disease in her father.  Current  Medications Current Medications[1]  Patients Current Diet:  Diet Order             Diet regular Room service appropriate? Yes; Fluid consistency: Thin  Diet effective now                   Precautions / Restrictions Restrictions Weight Bearing Restrictions Per Provider Order: No   Has the patient had 2 or more falls or a fall with injury in the past year? Yes  Prior Activity Level Limited Community (1-2x/wk): Went out 1-2 times a week to grocery store, [employer], husband usually drives.  Prior Functional Level Self Care: Did the patient need help bathing, dressing, using the toilet or eating? Needed some help  Indoor Mobility: Did the patient need assistance with walking from room to room (with or without device)? Independent  Stairs: Did the patient need assistance with internal or external stairs (with or without device)? Needed some help  Functional Cognition: Did the patient need help planning regular tasks such as shopping or remembering to take medications? Needed some help  Patient Information Are you of Hispanic, Latino/a,or Spanish origin?: A. No, not of Hispanic, Latino/a, or Spanish origin What is your race?: A. White Do you need or want an interpreter to communicate with a doctor or health care staff?: 0. No  Patient's Response To:  Health Literacy and Transportation Is the patient able to respond to health literacy and transportation needs?: Yes Health Literacy - How often do you need to have someone help you when you read instructions, pamphlets, or other written material from your doctor or pharmacy?: Never In the past 12 months, has lack of transportation kept you from medical appointments or from getting medications?: No In the past 12 months, has lack of transportation kept you from meetings, work, or from getting things needed for daily living?: No  Home Assistive Devices / Equipment    Prior Device Use: Indicate devices/aids used by the patient prior to  current illness, exacerbation or injury? Walker  Current Functional Level Cognition  Orientation Level: Oriented X4    Extremity Assessment (includes Sensation/Coordination)          ADLs       Mobility       Transfers       Ambulation / Gait / Stairs / Engineer, Drilling / Balance      Special considerations/life events  {Special Care Needs/Care Considerations:304600603}   Previous Home Environment (from acute therapy documentation) Living Arrangements: Spouse/significant other Home Care Services: No  Discharge Living Setting Plans for Discharge Living Setting: Patient's home, House, Lives with (comment) (Lives with husband.) Type of Home at Discharge: House Discharge Home Layout: One level Discharge Home Access: Stairs to enter Entrance Stairs-Rails: Right, Left, Can reach both Entrance Stairs-Number of Steps: 1+1 Discharge Bathroom Shower/Tub: Walk-in shower, Curtain Discharge Bathroom Toilet: Handicapped height Discharge Bathroom Accessibility: Yes How Accessible: Accessible via walker Does the patient have any problems obtaining your medications?: No  Social/Family/Support Systems Patient Roles: Spouse Contact Information: [place]qua Kretz - husband Anticipated Caregiver: Jodie - husband - [phone] Ability/Limitations of Caregiver: Husband is retired and can provide supervision to light assistance Caregiver Availability: 24/7 Discharge Plan Discussed with Primary Caregiver: Yes Is Caregiver In Agreement with Plan?: Yes Does Caregiver/Family have Issues with Lodging/Transportation while Pt is in Rehab?: No  Goals Patient/Family Goal for Rehab: PT/OT supervision goals Expected length of stay: 7-10 days Pt/Family Agrees to Admission and willing to participate: Yes Program Orientation Provided & Reviewed with Pt/Caregiver Including Roles  & Responsibilities: Yes  Decrease burden of Care through IP rehab admission: N/A  Possible need for SNF  placement upon discharge: Not anticipated  Patient Condition: I have reviewed medical records from [facility], spoken with CM, and patient. I met with patient at the bedside for inpatient rehabilitation assessment.  Patient will benefit from ongoing PT and OT, can actively participate in 3 hours of therapy a day 5 days of the week, and can make measurable gains during the admission.  Patient will also benefit from the coordinated team approach during an Inpatient Acute Rehabilitation admission.  The patient will receive intensive therapy as well as Rehabilitation physician, nursing, social worker, and care management interventions.  Due to bladder management, bowel management, safety, skin/wound care, disease management, medication administration, pain management, and patient education the patient requires 24 hour a day rehabilitation nursing.  The patient is currently *** with mobility and basic ADLs.  Discharge setting and therapy post discharge at home with home health is anticipated.  Patient has agreed to participate in the Acute Inpatient Rehabilitation Program and will admit {Time; today/tomorrow:10263}.  Preadmission Screen Completed By:  Lovett CHRISTELLA Ropes, 11/07/2024 10:41 AM ______________________________________________________________________   Discussed status with Dr. PIERRETTE on *** at *** and received approval for admission today.  Admission Coordinator:  Lovett CHRISTELLA Ropes, RN, time PIERRETTEPattricia ***   Assessment/Plan: Diagnosis: *** Does the need for close, 24 hr/day Medical supervision in concert with the patient's rehab needs make it unreasonable for this patient to be served in a less intensive setting? {yes_no_potentially:3041433} Co-Morbidities requiring supervision/potential complications: *** Due to {due un:6958565}, does the patient require 24 hr/day rehab nursing? {yes_no_potentially:3041433} Does the patient require coordinated care of a physician, rehab nurse, PT, OT, and SLP to  address physical and functional deficits in the context of the above medical diagnosis(es)? {yes_no_potentially:3041433} Addressing deficits in the following areas: {deficits:3041436} Can the patient actively participate in an intensive therapy program of at least 3 hrs of therapy 5 days a week? {yes_no_potentially:3041433} The potential for patient to make measurable gains while on inpatient rehab is {potential:3041437} Anticipated functional outcomes upon discharge from inpatient rehab: {functional outcomes:304600100} PT, {functional outcomes:304600100} OT, {functional outcomes:304600100} SLP Estimated rehab length of stay to reach the above functional goals is: *** Anticipated discharge destination: {anticipated dc setting:21604} 10. Overall Rehab/Functional Prognosis: {potential:3041437}   MD Signature: ***    [1]  Current Facility-Administered Medications:    0.9 %  sodium chloride  infusion, 10 mL/hr, Intravenous, Once, [name], MD  acetaminophen  (TYLENOL ) tablet 650 mg, 650 mg, Oral, Q4H PRN, 650 mg at 11/04/24 2115 **OR** acetaminophen  (TYLENOL ) suppository 650 mg, 650 mg, Rectal, Q4H PRN, [name], MD   acidophilus (RISAQUAD) capsule 1 capsule, 1 capsule, Oral, q AM, [name], MD, 1 capsule at 11/06/24 9048   ALPRAZolam  (XANAX ) tablet 0.5 mg, 0.5 mg, Oral, BID, [name], MD, 0.5 mg at 11/06/24 2050   alum & mag hydroxide-simeth (MAALOX/MYLANTA) 200-200-20 MG/5ML suspension 30 mL, 30 mL, Oral, Q6H PRN, [name], MD   atenolol  (TENORMIN ) tablet 25 mg, 25 mg, Oral, Daily, 25 mg at 11/06/24 0807 **AND** atenolol  (TENORMIN ) tablet 25 mg, 25 mg, Oral, Daily PRN, [name], MD, 25 mg at 11/06/24 1812   bisacodyl  (DULCOLAX) EC tablet 5 mg, 5 mg, Oral, Daily PRN, [name], MD, 5 mg at 11/05/24 9394   bisacodyl  (DULCOLAX) suppository 10 mg, 10 mg, Rectal, Daily PRN, [name], MD, 10 mg at 11/05/24 1610   cephALEXin  (KEFLEX ) capsule 500 mg, 500 mg, Oral,  Q8H, [name], MD, 500 mg at 11/07/24 9486   Chlorhexidine  Gluconate Cloth 2 % PADS 6 each, 6 each, Topical, Daily, Odell Celinda Balo, MD, 6 each at 11/06/24 0945   docusate sodium  (COLACE) capsule 100 mg, 100 mg, Oral, BID, [name], MD, 100 mg at 11/06/24 2051   escitalopram  (LEXAPRO ) tablet 5 mg, 5 mg, Oral, QHS, [name], MD, 5 mg at 11/06/24 2051   famotidine  (PEPCID ) tablet 40 mg, 40 mg, Oral, QHS, [name], MD, 40 mg at 11/06/24 2051   gabapentin  (NEURONTIN ) capsule 600 mg, 600 mg, Oral, TID, [name], MD, 600 mg at 11/06/24 2052   HYDROmorphone  (DILAUDID ) injection 1 mg, 1 mg, Intravenous, Q4H PRN, Odell Celinda Balo, MD, 1 mg at 11/06/24 2054   ketorolac  (TORADOL ) 15 MG/ML injection 15 mg, 15 mg, Intravenous, Q6H PRN, Odell Celinda Balo, MD, 15 mg at 11/07/24 0513   levothyroxine  (SYNTHROID ) tablet 25 mcg, 25 mcg, Oral, Q M,W,F, [name], MD, 25 mcg at 11/07/24 0514   melatonin tablet 3 mg, 3 mg, Oral, QHS PRN, [name], MD   menthol  (CEPACOL) lozenge 3 mg, 1 lozenge, Oral, PRN **OR** phenol (CHLORASEPTIC) mouth spray 1 spray, 1 spray, Mouth/Throat, PRN, [name], MD   methocarbamol  (ROBAXIN ) tablet 500 mg, 500 mg, Oral, Q6H PRN, 500 mg at 11/07/24 0513 **OR** methocarbamol  (ROBAXIN ) injection 500 mg, 500 mg, Intravenous, Q6H PRN, [name], MD, 500 mg at 11/03/24 2131   ondansetron  (ZOFRAN ) tablet 4 mg, 4 mg, Oral, Q6H PRN, 4 mg at 11/06/24 1838 **OR** ondansetron  (ZOFRAN ) injection 4 mg, 4 mg, Intravenous, Q6H PRN, [name], MD   oxyCODONE  (Oxy IR/ROXICODONE ) immediate release tablet 10 mg, 10 mg, Oral, Q4H PRN, Odell Celinda Balo, MD, 10 mg at 11/06/24 2353   pantoprazole  (PROTONIX ) EC tablet 40 mg, 40 mg, Oral, Daily, [name], MD, 40 mg at 11/06/24 0950   polyethylene glycol (MIRALAX  / GLYCOLAX ) packet 17 g, 17 g, Oral, Daily PRN, [name], MD, 17 g at 11/04/24 1841   senna (SENOKOT) tablet 8.6 mg, 1 tablet, Oral, BID,  [name], MD, 8.6 mg at 11/06/24 2052   sodium chloride  flush (NS) 0.9 % injection 3 mL, 3 mL, Intravenous, Q12H, Elsner, Shove, MD, 3 mL at 11/06/24 2052   sodium chloride  flush (NS) 0.9 % injection 3 mL, 3 mL, Intravenous, PRN, [name], MD   sodium phosphate  (FLEET) enema 1 enema, 1 enema, Rectal, Once PRN, [name], MD

## 2024-11-07 NOTE — Plan of Care (Signed)
 Patient is calm, relative at bedside.   Problem: Education: Goal: Knowledge of General Education information will improve Description: Including pain rating scale, medication(s)/side effects and non-pharmacologic comfort measures Outcome: Progressing   Problem: Clinical Measurements: Goal: Will remain free from infection Outcome: Progressing   Problem: Activity: Goal: Risk for activity intolerance will decrease Outcome: Progressing   Problem: Nutrition: Goal: Adequate nutrition will be maintained Outcome: Progressing   Problem: Coping: Goal: Level of anxiety will decrease Outcome: Progressing   Problem: Elimination: Goal: Will not experience complications related to bowel motility Outcome: Progressing   Problem: Pain Managment: Goal: General experience of comfort will improve and/or be controlled Outcome: Progressing   Problem: Skin Integrity: Goal: Risk for impaired skin integrity will decrease Outcome: Progressing   Problem: Skin Integrity: Goal: Demonstrates signs of wound healing without infection Outcome: Progressing   Problem: Activity: Goal: Ability to avoid complications of mobility impairment will improve Outcome: Progressing   Problem: Activity: Goal: Ability to tolerate increased activity will improve Outcome: Progressing   Problem: Pain Management: Goal: Pain level will decrease Outcome: Progressing

## 2024-11-07 NOTE — Progress Notes (Signed)
 Patient ID: [name], female   DOB: Apr 01, 1946, 79 y.o.   MRN: [id number] Vital signs are stable Patient is eager to get mobilized I have advised that we can start getting her out of bed slowly Discussed that the CT shows that her back fixation looks to be in good position and lysing will start to place some load on the construct.  Will reactivate PT and OT.  Also I believe patient will likely require rehab medicine to become involved.

## 2024-11-07 NOTE — Progress Notes (Signed)
 IP rehab admissions - I met with patient at the bedside.  She is very sleepy this morning.  I gave her rehab booklets and explained rehab.  She hopes to be able to DC home with husband and not need CIR stay.  PT/OT pending for tomorrow due to bedrest until tomorrow.  I will follow up after therapy evaluations are completed.  [phone]

## 2024-11-08 DIAGNOSIS — I2699 Other pulmonary embolism without acute cor pulmonale: Secondary | ICD-10-CM | POA: Diagnosis not present

## 2024-11-08 DIAGNOSIS — N1831 Chronic kidney disease, stage 3a: Secondary | ICD-10-CM | POA: Diagnosis not present

## 2024-11-08 DIAGNOSIS — Z7901 Long term (current) use of anticoagulants: Secondary | ICD-10-CM | POA: Diagnosis not present

## 2024-11-08 DIAGNOSIS — S32059D Unspecified fracture of fifth lumbar vertebra, subsequent encounter for fracture with routine healing: Secondary | ICD-10-CM | POA: Diagnosis not present

## 2024-11-08 LAB — CBC WITH DIFFERENTIAL/PLATELET
Abs Immature Granulocytes: 0.06 K/uL (ref 0.00–0.07)
Basophils Absolute: 0 K/uL (ref 0.0–0.1)
Basophils Relative: 0 %
Eosinophils Absolute: 0.3 K/uL (ref 0.0–0.5)
Eosinophils Relative: 3 %
HCT: 25.6 % — ABNORMAL LOW (ref 36.0–46.0)
Hemoglobin: 8.3 g/dL — ABNORMAL LOW (ref 12.0–15.0)
Immature Granulocytes: 1 %
Lymphocytes Relative: 11 %
Lymphs Abs: 0.9 K/uL (ref 0.7–4.0)
MCH: 28.5 pg (ref 26.0–34.0)
MCHC: 32.4 g/dL (ref 30.0–36.0)
MCV: 88 fL (ref 80.0–100.0)
Monocytes Absolute: 1.1 K/uL — ABNORMAL HIGH (ref 0.1–1.0)
Monocytes Relative: 13 %
Neutro Abs: 6.3 K/uL (ref 1.7–7.7)
Neutrophils Relative %: 72 %
Platelets: 388 K/uL (ref 150–400)
RBC: 2.91 MIL/uL — ABNORMAL LOW (ref 3.87–5.11)
RDW: 15.1 % (ref 11.5–15.5)
WBC: 8.7 K/uL (ref 4.0–10.5)
nRBC: 0 % (ref 0.0–0.2)

## 2024-11-08 NOTE — Progress Notes (Signed)
 "        [facility]                                                                               [name], is a 79 y.o. female, DOB - [birth year], FMW:[id number] Admit date - 10/31/2024    Outpatient Primary MD for the patient is Onita Rush, MD  LOS - 8  days    Brief summary   [name] is an 79 y.o. female past medical history significant for PE DVT on Xarelto , history of an IVC filter, multiple back surgeries mild aortic stenosis essential hypertension morbid obesity presents today after mechanical fall CT of the lumbar spine showed Chance fracture of old L4-L5 fusion with significant distraction.  Neurosurgery was consulted who recommended bedrest and a 30 degree angle for pain control and rehab.    Assessment & Plan    Assessment and Plan:   Acute close lumbar fracture S/p open reduction at the L4-L5. Repeat CT of the lumbar spine without contrast shows Recent hyperextension injury at L5-S1 status post interval posterior fusion. Unchanged anterior distraction of the L5-S1 disc space. Regional postoperative changes and retroperitoneal hematoma. Neurosurgery on board recommending therapy evaluations at this time Pain controlled. Waiting for her to progress with physical therapy to see hopefully she can be safely discharged.    AKI and hyperkalemia Resolved. Creatinine back to baseline.    Acute anemia from blood loss  Secondary to retroperitoneal hematoma. Continue to monitor hemoglobin and transfuse to keep it greater than 7 Hemoglobin around 8.3.    History of PE/DVT and an IVC filter in place Need to hold Xarelto  and any kind of anticoagulation due to retroperitoneal bleed and a drop of hemoglobin from 10-7.8.   Body mass index is 52.26 kg/m. Morbid obesity Poor prognostic factor   Hypothyroidism Continue Synthroid   Lymphedema Status post IVC filter    Estimated body mass index is 52.26 kg/m as calculated from the following:    Height as of this encounter: 5' 4 (1.626 m).   Weight as of this encounter: 138.1 kg.  Code Status: Full code DVT Prophylaxis:  Place and maintain sequential compression device Start: 11/06/24 0848 SCD's Start: 11/01/24 2359   Level of Care: Level of care: Med-Surg Family Communication: None at bedside  Disposition Plan:     Remains inpatient appropriate:  pending.    Procedures:  S/p lumbar fusion surgery.   Consultants:   NS   Antimicrobials:   Anti-infectives (From admission, onward)    Start     Dose/Rate Route Frequency Ordered Stop   11/06/24 1400  cephALEXin  (KEFLEX ) capsule 500 mg        500 mg Oral Every 8 hours 11/06/24 1036     11/02/24 0045  ceFAZolin  (ANCEF ) IVPB 2g/100 mL premix        2 g 200 mL/hr over 30 Minutes Intravenous Every 8 hours 11/01/24 2358 11/02/24 0930        Medications  Scheduled Meds:  acidophilus  1 capsule Oral q AM   ALPRAZolam   0.25 mg Oral BID   atenolol   25 mg Oral Daily   cephALEXin   500 mg Oral Q8H  Chlorhexidine  Gluconate Cloth  6 each Topical Daily   docusate sodium   100 mg Oral BID   escitalopram   5 mg Oral QHS   famotidine   40 mg Oral QHS   gabapentin   600 mg Oral TID   levothyroxine   25 mcg Oral Q M,W,F   pantoprazole   40 mg Oral Daily   senna  1 tablet Oral BID   sodium chloride  flush  3 mL Intravenous Q12H   Continuous Infusions:  sodium chloride      PRN Meds:.acetaminophen  **OR** acetaminophen , alum & mag hydroxide-simeth, atenolol  **AND** atenolol , bisacodyl , bisacodyl , HYDROmorphone  (DILAUDID ) injection, melatonin, menthol  **OR** phenol, methocarbamol  **OR** methocarbamol  (ROBAXIN ) injection, ondansetron  **OR** ondansetron  (ZOFRAN ) IV, oxyCODONE , polyethylene glycol, sodium chloride  flush, sodium phosphate     Subjective:   [name] was seen and examined today.  No new complaints today. Wants to go home.   Objective:   Vitals:   11/08/24 0355 11/08/24 0402 11/08/24 0753 11/08/24 1442  BP: (!)  146/49 132/70 (!) 141/66 (!) 159/63  Pulse: 67 100 72 72  Resp: 18 16 18 19   Temp: 98 F (36.7 C) (!) 97.5 F (36.4 C) 98.4 F (36.9 C) 98.2 F (36.8 C)  TempSrc: Oral Oral    SpO2: 97% 94% 96% 94%  Weight:      Height:        Intake/Output Summary (Last 24 hours) at 11/08/2024 1817 Last data filed at 11/08/2024 1545 Gross per 24 hour  Intake 820 ml  Output 1850 ml  Net -1030 ml   Filed Weights   10/31/24 1832 11/02/24 0144  Weight: 124.3 kg (!) 138.1 kg     Exam General exam: Appears calm and comfortable  Respiratory system: Clear to auscultation. Respiratory effort normal. Cardiovascular system: S1 & S2 heard, RRR.  Gastrointestinal system: Abdomen is nondistended, soft and nontender. Central nervous system: Alert and oriented. Extremities: bilateral pedal edema.  Skin: No rashes, Psychiatry:  Mood & affect appropriate.     Data Reviewed:  I have personally reviewed following labs and imaging studies   CBC Lab Results  Component Value Date   WBC 8.7 11/08/2024   RBC 2.91 (L) 11/08/2024   HGB 8.3 (L) 11/08/2024   HCT 25.6 (L) 11/08/2024   MCV 88.0 11/08/2024   MCH 28.5 11/08/2024   PLT 388 11/08/2024   MCHC 32.4 11/08/2024   RDW 15.1 11/08/2024   LYMPHSABS 0.9 11/08/2024   MONOABS 1.1 (H) 11/08/2024   EOSABS 0.3 11/08/2024   BASOSABS 0.0 11/08/2024     Last metabolic panel Lab Results  Component Value Date   NA 134 (L) 11/07/2024   K 4.6 11/07/2024   CL 99 11/07/2024   CO2 28 11/07/2024   BUN 21 11/07/2024   CREATININE 0.80 11/07/2024   GLUCOSE 101 (H) 11/07/2024   GFRNONAA >60 11/07/2024   GFRAA >60 08/19/2018   CALCIUM  9.5 11/07/2024   PROT 6.8 10/01/2021   ALBUMIN 3.8 10/01/2021   BILITOT 0.6 10/01/2021   ALKPHOS 52 10/01/2021   AST 27 10/01/2021   ALT 16 10/01/2021   ANIONGAP 7 11/07/2024    CBG (last 3)  No results for input(s): GLUCAP in the last 72 hours.    Coagulation Profile: No results for input(s): INR, PROTIME  in the last 168 hours.   Radiology Studies: No results found.     Elgie Butter M.D. [facility] 11/08/2024, 6:17 PM  Available via Epic secure chat 7am-7pm After 7 pm, please refer to night coverage provider listed on amion.    "

## 2024-11-08 NOTE — Progress Notes (Signed)
 Patient ID: [name], female   DOB: June 29, 1946, 79 y.o.   MRN: [id number] Signs are stable Apparently patient sat at side of bed yesterday It is unclear that she took any steps Continue with mobilization today Discussed importance of likely need for inpatient rehabilitation She is still rather resistant to this but is clear that her family could not handle her at home the way she is now. I will continue to follow along clinically.

## 2024-11-08 NOTE — Evaluation (Signed)
 Occupational Therapy Evaluation Patient Details Name: [name] MRN: [id number] DOB: 1945/10/20 Today's Date: 11/08/2024   History of Present Illness   Pt is a 79 y.o. F presenting to [facility] ED following a fall at home. IMG shows L4-L5 fx with significant distraction. Pt is s/p L4-L5 ORIF. Pt recently admitted 10/01/21 for a fall sustaining T7-T8 fx dislocation and is s/p posterior fixation of T6-T9. PMH is significant for T11 kyphoplasty, DVT, and PE.     Clinical Impressions Pt typically walks with a rollator or furniture walks. She is independent in self care, cooks and manages the family bills. Pt's husband barista. He is available 24 hours, but is not able to assist with mobility. Pt sleeps in a lift chair. Pt presents with back pain and generalized weakness. She need mod assist to roll with rail, max assist for side to sit and mod assist to return to sidelying. Pt sat EOB with CGA x 15 minutes and participated in grooming. Pt needs set up to total assist for ADLs. Patient will benefit from intensive inpatient follow-up therapy, >3 hours/day.     If plan is discharge home, recommend the following:   Two people to help with walking and/or transfers;Two people to help with bathing/dressing/bathroom;Assistance with cooking/housework;Assist for transportation;Help with stairs or ramp for entrance     Functional Status Assessment   Patient has had a recent decline in their functional status and demonstrates the ability to make significant improvements in function in a reasonable and predictable amount of time.     Equipment Recommendations   None recommended by OT (defer)     Recommendations for Other Services         Precautions/Restrictions   Precautions Precautions: Back;Fall Precaution Booklet Issued: Yes (comment) Recall of Precautions/Restrictions: Impaired Precaution/Restrictions Comments: cues for log roll technique Restrictions Weight Bearing  Restrictions Per Provider Order: No     Mobility Bed Mobility Overal bed mobility: Needs Assistance Bed Mobility: Rolling, Sidelying to Sit, Sit to Sidelying Rolling: Mod assist Sidelying to sit: Max assist     Sit to sidelying: mod assist General bed mobility comments: cues for log roll technique    Transfers Overall transfer level: Needs assistance                 General transfer comment: pt unable to stand from elevated bed, demonstrated ability to laterally scoot along EOB with min assist      Balance Overall balance assessment: Needs assistance   Sitting balance-Leahy Scale: Fair Sitting balance - Comments: poor initially with posterior lean, progressed to fair                                   ADL either performed or assessed with clinical judgement   ADL Overall ADL's : Needs assistance/impaired Eating/Feeding: Independent;Bed level   Grooming: Oral care;Brushing hair;Sitting;Contact guard assist   Upper Body Bathing: Moderate assistance;Sitting   Lower Body Bathing: Total assistance;Bed level   Upper Body Dressing : Minimal assistance;Sitting   Lower Body Dressing: Total assistance;Bed level       Toileting- Clothing Manipulation and Hygiene: Total assistance;Bed level               Vision Ability to See in Adequate Light: 0 Adequate Patient Visual Report: No change from baseline       Perception         Praxis  Pertinent Vitals/Pain Pain Assessment Pain Assessment: Faces Faces Pain Scale: Hurts little more Pain Location: LEs with touch, back incision Pain Descriptors / Indicators: Grimacing, Guarding, Sore Pain Intervention(s): Repositioned, Monitored during session     Extremity/Trunk Assessment Upper Extremity Assessment Upper Extremity Assessment: Overall WFL for tasks assessed;Right hand dominant;RUE deficits/detail RUE Deficits / Details: hx of shoulder replacement RUE Coordination: decreased  gross motor   Lower Extremity Assessment Lower Extremity Assessment: Defer to PT evaluation   Cervical / Trunk Assessment Cervical / Trunk Assessment: Kyphotic;Back Surgery;Other exceptions (weakness)   Communication Communication Communication: No apparent difficulties   Cognition Arousal: Alert Behavior During Therapy: WFL for tasks assessed/performed Cognition: No apparent impairments                               Following commands: Intact       Cueing  General Comments   Cueing Techniques: Verbal cues;Gestural cues;Visual cues      Exercises     Shoulder Instructions      Home Living Family/patient expects to be discharged to:: Private residence Living Arrangements: Spouse/significant other Available Help at Discharge: Family;Available 24 hours/day Type of Home: House Home Access: Stairs to enter [employer] of Steps: 3 Entrance Stairs-Rails: Right;Left Home Layout: One level     Bathroom Shower/Tub: Producer, Television/film/video: Handicapped height     Home Equipment: Rollator (4 wheels);Rolling Walker (2 wheels);Cane - quad;Shower seat   Additional Comments: husband cannot assist with mobility      Prior Functioning/Environment Prior Level of Function : Independent/Modified Independent;History of Falls (last six months)             Mobility Comments: tends to use the rollator on the day to day but occasionally will furniture surf. pt also sleeps in lift chair ADLs Comments: indep, has a hard time finding socks that fit, so often avoids    OT Problem List: Decreased range of motion;Impaired balance (sitting and/or standing);Pain;Obesity;Decreased knowledge of precautions;Decreased knowledge of use of DME or AE   OT Treatment/Interventions: Self-care/ADL training;DME and/or AE instruction;Therapeutic activities;Patient/family education;Balance training      OT Goals(Current goals can be found in the care plan  section)   Acute Rehab OT Goals OT Goal Formulation: With patient Time For Goal Achievement: 11/22/24 Potential to Achieve Goals: Good ADL Goals Pt Will Perform Grooming: with set-up;sitting Pt Will Perform Lower Body Bathing: with min assist;with adaptive equipment;sit to/from stand Pt Will Perform Lower Body Dressing: with min assist;sit to/from stand;with adaptive equipment Pt Will Transfer to Toilet: with min assist;ambulating;bedside commode Additional ADL Goal #1: Pt will adhere to back precautions with minimal verbal cues.   OT Frequency:  Min 2X/week    Co-evaluation              AM-PAC OT 6 Clicks Daily Activity     Outcome Measure Help from another person eating meals?: None Help from another person taking care of personal grooming?: A Little Help from another person toileting, which includes using toliet, bedpan, or urinal?: Total Help from another person bathing (including washing, rinsing, drying)?: A Lot Help from another person to put on and taking off regular upper body clothing?: A Little Help from another person to put on and taking off regular lower body clothing?: Total 6 Click Score: 14   End of Session Nurse Communication: Mobility status  Activity Tolerance: Patient tolerated treatment well Patient left: in bed;with call bell/phone within  reach;with bed alarm set;with family/visitor present  OT Visit Diagnosis: Pain;Muscle weakness (generalized) (M62.81)                Time: 8654-8565 OT Time Calculation (min): 49 min Charges:  OT General Charges $OT Visit: 1 Visit OT Evaluation $OT Eval Moderate Complexity: 1 Mod OT Treatments $Self Care/Home Management : 23-37 mins  Mliss HERO, OTR/L Acute Rehabilitation Services Office: [phone]   Kennth Mliss Helling 11/08/2024, 2:54 PM

## 2024-11-08 NOTE — Plan of Care (Signed)
 Patient is calm and resting well   Problem: Education: Goal: Knowledge of General Education information will improve Description: Including pain rating scale, medication(s)/side effects and non-pharmacologic comfort measures Outcome: Progressing   Problem: Clinical Measurements: Goal: Will remain free from infection Outcome: Progressing   Problem: Activity: Goal: Risk for activity intolerance will decrease Outcome: Progressing   Problem: Pain Managment: Goal: General experience of comfort will improve and/or be controlled Outcome: Progressing   Problem: Skin Integrity: Goal: Risk for impaired skin integrity will decrease Outcome: Progressing   Problem: Respiratory: Goal: Respiratory status will improve Outcome: Progressing   Problem: Skin Integrity: Goal: Demonstrates signs of wound healing without infection Outcome: Progressing

## 2024-11-09 DIAGNOSIS — R600 Localized edema: Secondary | ICD-10-CM | POA: Diagnosis not present

## 2024-11-09 DIAGNOSIS — Z7901 Long term (current) use of anticoagulants: Secondary | ICD-10-CM | POA: Diagnosis not present

## 2024-11-09 DIAGNOSIS — I2699 Other pulmonary embolism without acute cor pulmonale: Secondary | ICD-10-CM | POA: Diagnosis not present

## 2024-11-09 DIAGNOSIS — N1831 Chronic kidney disease, stage 3a: Secondary | ICD-10-CM | POA: Diagnosis not present

## 2024-11-09 DIAGNOSIS — S32059D Unspecified fracture of fifth lumbar vertebra, subsequent encounter for fracture with routine healing: Secondary | ICD-10-CM | POA: Diagnosis not present

## 2024-11-09 LAB — CBC WITH DIFFERENTIAL/PLATELET
Abs Immature Granulocytes: 0.09 K/uL — ABNORMAL HIGH (ref 0.00–0.07)
Basophils Absolute: 0 K/uL (ref 0.0–0.1)
Basophils Relative: 0 %
Eosinophils Absolute: 0.3 K/uL (ref 0.0–0.5)
Eosinophils Relative: 3 %
HCT: 25.2 % — ABNORMAL LOW (ref 36.0–46.0)
Hemoglobin: 8.3 g/dL — ABNORMAL LOW (ref 12.0–15.0)
Immature Granulocytes: 1 %
Lymphocytes Relative: 10 %
Lymphs Abs: 0.9 K/uL (ref 0.7–4.0)
MCH: 29 pg (ref 26.0–34.0)
MCHC: 32.9 g/dL (ref 30.0–36.0)
MCV: 88.1 fL (ref 80.0–100.0)
Monocytes Absolute: 1.3 K/uL — ABNORMAL HIGH (ref 0.1–1.0)
Monocytes Relative: 14 %
Neutro Abs: 6.6 K/uL (ref 1.7–7.7)
Neutrophils Relative %: 72 %
Platelets: 423 K/uL — ABNORMAL HIGH (ref 150–400)
RBC: 2.86 MIL/uL — ABNORMAL LOW (ref 3.87–5.11)
RDW: 15.3 % (ref 11.5–15.5)
WBC: 9.3 K/uL (ref 4.0–10.5)
nRBC: 0 % (ref 0.0–0.2)

## 2024-11-09 LAB — BASIC METABOLIC PANEL WITH GFR
Anion gap: 11 (ref 5–15)
Anion gap: 9 (ref 5–15)
BUN: 14 mg/dL (ref 8–23)
BUN: 15 mg/dL (ref 8–23)
CO2: 26 mmol/L (ref 22–32)
CO2: 27 mmol/L (ref 22–32)
Calcium: 9.8 mg/dL (ref 8.9–10.3)
Calcium: 9.7 mg/dL (ref 8.9–10.3)
Chloride: 94 mmol/L — ABNORMAL LOW (ref 98–111)
Chloride: 93 mmol/L — ABNORMAL LOW (ref 98–111)
Creatinine, Ser: 0.77 mg/dL (ref 0.44–1.00)
Creatinine, Ser: 0.78 mg/dL (ref 0.44–1.00)
GFR, Estimated: >60 mL/min
GFR, Estimated: >60 mL/min
Glucose, Bld: 91 mg/dL (ref 70–99)
Glucose, Bld: 132 mg/dL — ABNORMAL HIGH (ref 70–99)
Potassium: 4.1 mmol/L (ref 3.5–5.1)
Potassium: 4.3 mmol/L (ref 3.5–5.1)
Sodium: 131 mmol/L — ABNORMAL LOW (ref 135–145)
Sodium: 129 mmol/L — ABNORMAL LOW (ref 135–145)

## 2024-11-09 MED ORDER — SIMVASTATIN 20 MG PO TABS
20.0000 mg | ORAL_TABLET | Freq: Every day | ORAL | Status: DC
  Administered 2024-11-09 – 2024-11-19 (×10): 20 mg via ORAL
  Filled 2024-11-09 (×9): qty 1

## 2024-11-09 MED ORDER — TRAMADOL HCL 50 MG PO TABS
50.0000 mg | ORAL_TABLET | Freq: Two times a day (BID) | ORAL | Status: DC | PRN
  Administered 2024-11-09 – 2024-11-10 (×3): 100 mg via ORAL
  Filled 2024-11-09 (×3): qty 2

## 2024-11-09 MED ORDER — HYDRALAZINE HCL 20 MG/ML IJ SOLN
5.0000 mg | Freq: Three times a day (TID) | INTRAMUSCULAR | Status: DC | PRN
  Administered 2024-11-12: 5 mg via INTRAVENOUS
  Filled 2024-11-09: qty 1

## 2024-11-09 MED ORDER — ENSURE PLUS HIGH PROTEIN PO LIQD
237.0000 mL | Freq: Two times a day (BID) | ORAL | Status: DC
  Administered 2024-11-09 – 2024-11-20 (×20): 237 mL via ORAL

## 2024-11-09 MED ORDER — CEPHALEXIN 500 MG PO CAPS
500.0000 mg | ORAL_CAPSULE | Freq: Three times a day (TID) | ORAL | Status: AC
  Administered 2024-11-09 – 2024-11-10 (×3): 500 mg via ORAL
  Filled 2024-11-09 (×3): qty 1

## 2024-11-09 MED ORDER — LOSARTAN POTASSIUM 50 MG PO TABS
50.0000 mg | ORAL_TABLET | Freq: Every day | ORAL | Status: DC
  Administered 2024-11-09 – 2024-11-11 (×3): 50 mg via ORAL
  Filled 2024-11-09 (×3): qty 1

## 2024-11-09 NOTE — Progress Notes (Signed)
 Inpatient Rehab Admissions Coordinator:    OT now recommending SNF. When I spoke with Pt., she did not feel she could handle 3 hours a day on rehab but wants to discuss with doctor. I will follow but she appears likely to need SNF.   Leita Kleine, MS, CCC-SLP Rehab Admissions Coordinator  [phone] (celll) [phone] (office)

## 2024-11-09 NOTE — Progress Notes (Signed)
 Inpatient Rehab Admissions Coordinator:    CIR following. She's not currently able to tolerate the intensity of CIR and prefers to dc home if she can; however, I will follow and see how she progresses.  Leita Kleine, MS, CCC-SLP Rehab Admissions Coordinator  [phone] (celll) [phone] (office)

## 2024-11-09 NOTE — Progress Notes (Signed)
 Occupational Therapy Treatment Patient Details Name: [name] MRN: [id number] DOB: Oct 07, 1946 Today's Date: 11/09/2024   History of present illness Pt is a 79 y.o. F presenting to [facility] ED following a fall at home. IMG shows L4-L5 fx with significant distraction. Pt is s/p L4-L5 ORIF. Pt recently admitted 10/01/21 for a fall sustaining T7-T8 fx dislocation and is s/p posterior fixation of T6-T9. PMH is significant for T11 kyphoplasty, DVT, and PE.   OT comments  Pt is progressing slowly, more impacted by back pain today. Continues to be unable to eat. Pt needing more assistance for rolling, raising trunk with less assistance. Pt demonstrated ability to stand from elevated bed x 2 with RW and +2 min to mod assist. Unable to march or take side steps. Struggling more with lateral scooting at EOB and shifting hips forward and back at EOB compared to yesterday. Pt requiring B UE support at EOB to maintain sitting balance due to pain. Updated discharge recommendation as pt is not demonstrating tolerance for the level of intensity required of AIR. Patient will benefit from continued inpatient follow up therapy, <3 hours/day       If plan is discharge home, recommend the following:  Two people to help with walking and/or transfers;Two people to help with bathing/dressing/bathroom;Assistance with cooking/housework;Assist for transportation;Help with stairs or ramp for entrance   Equipment Recommendations  None recommended by OT (defer)    Recommendations for Other Services      Precautions / Restrictions Precautions Precautions: Back;Fall Precaution Booklet Issued: Yes (comment) Recall of Precautions/Restrictions: Impaired Precaution/Restrictions Comments: cues for log roll technique Restrictions Weight Bearing Restrictions Per Provider Order: No       Mobility Bed Mobility Overal bed mobility: Needs Assistance Bed Mobility: Rolling, Sidelying to Sit, Supine to Sit Rolling: +2 for  physical assistance, Max assist Sidelying to sit: +2 for physical assistance, Mod assist Supine to sit: +2 for physical assistance, Total assist     General bed mobility comments: cues for log roll technique, assist for all aspects to achieve sitting EOB, pt with imoroved ability to raise trunk, used helicopter technique to return to supine with +2 total assist    Transfers Overall transfer level: Needs assistance Equipment used: Rolling walker (2 wheels) Transfers: Sit to/from Stand Sit to Stand: +2 physical assistance, Mod assist, From elevated surface           General transfer comment: stood from elevated bed x 2 with verbal cues for hand placement, unable to step or tolerate longer than a few seconds     Balance Overall balance assessment: Needs assistance Sitting-balance support: Bilateral upper extremity supported, Feet supported Sitting balance-Leahy Scale: Poor Sitting balance - Comments: reliant on at least one hand, preferably 2 to maintain balance, pain contributing   Standing balance support: Bilateral upper extremity supported, During functional activity, Reliant on assistive device for balance Standing balance-Leahy Scale: Poor                             ADL either performed or assessed with clinical judgement   ADL Overall ADL's : Needs assistance/impaired     Grooming: Brushing hair;Sitting;Total assistance           Upper Body Dressing : Moderate assistance;Sitting                          Extremity/Trunk Assessment  Vision       Perception     Praxis     Communication Communication Communication: No apparent difficulties   Cognition Arousal: Alert Behavior During Therapy: Anxious Cognition: Cognition impaired     Awareness: Intellectual awareness impaired, Online awareness impaired Memory impairment (select all impairments): Short-term memory   Executive functioning impairment (select all  impairments): Initiation, Sequencing, Reasoning, Problem solving                   Following commands: Impaired Following commands impaired: Follows one step commands with increased time, Follows one step commands inconsistently      Cueing   Cueing Techniques: Verbal cues, Visual cues, Tactile cues  Exercises      Shoulder Instructions       General Comments VSS on RA    Pertinent Vitals/ Pain       Pain Assessment Pain Assessment: Faces Faces Pain Scale: Hurts whole lot Pain Location: LEs with touch, back Pain Descriptors / Indicators: Grimacing, Guarding, Stabbing Pain Intervention(s): Monitored during session, Repositioned, Premedicated before session  Home Living                                          Prior Functioning/Environment              Frequency  Min 2X/week        Progress Toward Goals  OT Goals(current goals can now be found in the care plan section)  Progress towards OT goals: Progressing toward goals  Acute Rehab OT Goals OT Goal Formulation: With patient Time For Goal Achievement: 11/22/24 Potential to Achieve Goals: Good  Plan      Co-evaluation    PT/OT/SLP Co-Evaluation/Treatment: Yes Reason for Co-Treatment: For patient/therapist safety   OT goals addressed during session: ADL's and self-care;Strengthening/ROM      AM-PAC OT 6 Clicks Daily Activity     Outcome Measure   Help from another person eating meals?: None Help from another person taking care of personal grooming?: A Lot Help from another person toileting, which includes using toliet, bedpan, or urinal?: Total Help from another person bathing (including washing, rinsing, drying)?: A Lot Help from another person to put on and taking off regular upper body clothing?: A Lot Help from another person to put on and taking off regular lower body clothing?: Total 6 Click Score: 12    End of Session Equipment Utilized During Treatment: Gait  belt;Rolling walker (2 wheels)  OT Visit Diagnosis: Pain;Muscle weakness (generalized) (M62.81)   Activity Tolerance Patient limited by fatigue;Patient limited by pain   Patient Left in bed;with call bell/phone within reach;with bed alarm set;with nursing/sitter in room   Nurse Communication          Time: 8956-8870 OT Time Calculation (min): 46 min  Charges: OT General Charges $OT Visit: 1 Visit OT Treatments $Therapeutic Activity: 8-22 mins  Mliss HERO, OTR/L Acute Rehabilitation Services Office: [phone]  Kennth Mliss Helling 11/09/2024, 12:52 PM

## 2024-11-09 NOTE — Plan of Care (Signed)
" °  Problem: Education: Goal: Knowledge of General Education information will improve Description: Including pain rating scale, medication(s)/side effects and non-pharmacologic comfort measures Outcome: Progressing   Problem: Clinical Measurements: Goal: Will remain free from infection Outcome: Progressing Goal: Cardiovascular complication will be avoided Outcome: Progressing   Problem: Coping: Goal: Level of anxiety will decrease Outcome: Progressing   Problem: Pain Managment: Goal: General experience of comfort will improve and/or be controlled Outcome: Progressing   "

## 2024-11-09 NOTE — Progress Notes (Signed)
 "        [name]                                                                               Maybelle Depaoli, is a 79 y.o. female, DOB - [birth year], FMW:[id number] Admit date - 10/31/2024    Outpatient Primary MD for the patient is Onita Rush, MD  LOS - 9  days    Brief summary   [name] is an 79 y.o. female past medical history significant for PE DVT on Xarelto , history of an IVC filter, multiple back surgeries mild aortic stenosis essential hypertension morbid obesity presents today after mechanical fall CT of the lumbar spine showed Chance fracture of old L4-L5 fusion with significant distraction.  Neurosurgery was consulted who recommended bedrest and a 30 degree angle for pain control and rehab.    Assessment & Plan    Assessment and Plan:   Acute close lumbar fracture S/p open reduction at the L4-L5. Repeat CT of the lumbar spine without contrast shows Recent hyperextension injury at L5-S1 status post interval posterior fusion. Unchanged anterior distraction of the L5-S1 disc space. Regional postoperative changes and retroperitoneal hematoma. Neurosurgery on board recommending therapy evaluations at this time Pain controlled. Waiting for her to progress with physical therapy to see hopefully she can be safely discharged.    AKI and hyperkalemia Resolved. Creatinine back to baseline.    Acute anemia from blood loss  Secondary to retroperitoneal hematoma. Continue to monitor hemoglobin and transfuse to keep it greater than 7 Hemoglobin around 8.3.    History of PE/DVT and an IVC filter in place Need to hold Xarelto  and any kind of anticoagulation due to retroperitoneal bleed and a drop of hemoglobin from 10-7.8. Hemoglobin stabilized at 8.    Body mass index is 52.26 kg/m. Morbid obesity Poor prognostic factor   Hypothyroidism Continue Synthroid   Lymphedema Status post IVC filter   Hyponatremia Unclear etiology.  Get TSH, urine osmo,  urine sodium. Serum osmolality.   Hypertension:  BP parameters are elevated. Added losartan  to atenolol  .  Added hydralazine  prn.     Estimated body mass index is 52.26 kg/m as calculated from the following:   Height as of this encounter: 5' 4 (1.626 m).   Weight as of this encounter: 138.1 kg.  Code Status: Full code DVT Prophylaxis:  Place and maintain sequential compression device Start: 11/06/24 0848 SCD's Start: 11/01/24 2359   Level of Care: Level of care: Med-Surg Family Communication: None at bedside  Disposition Plan:     Remains inpatient appropriate:  pending.    Procedures:  S/p lumbar fusion surgery.   Consultants:   NS   Antimicrobials:   Anti-infectives (From admission, onward)    Start     Dose/Rate Route Frequency Ordered Stop   11/06/24 1400  cephALEXin  (KEFLEX ) capsule 500 mg        500 mg Oral Every 8 hours 11/06/24 1036     11/02/24 0045  ceFAZolin  (ANCEF ) IVPB 2g/100 mL premix        2 g 200 mL/hr over 30 Minutes Intravenous Every 8 hours 11/01/24 2358 11/02/24 0930  Medications  Scheduled Meds:  acidophilus  1 capsule Oral q AM   ALPRAZolam   0.25 mg Oral BID   atenolol   25 mg Oral Daily   cephALEXin   500 mg Oral Q8H   Chlorhexidine  Gluconate Cloth  6 each Topical Daily   docusate sodium   100 mg Oral BID   escitalopram   5 mg Oral QHS   famotidine   40 mg Oral QHS   feeding supplement  237 mL Oral BID BM   gabapentin   600 mg Oral TID   levothyroxine   25 mcg Oral Q M,W,F   pantoprazole   40 mg Oral Daily   senna  1 tablet Oral BID   sodium chloride  flush  3 mL Intravenous Q12H   Continuous Infusions:  sodium chloride      PRN Meds:.acetaminophen  **OR** acetaminophen , alum & mag hydroxide-simeth, atenolol  **AND** atenolol , bisacodyl , bisacodyl , hydrALAZINE , HYDROmorphone  (DILAUDID ) injection, melatonin, menthol  **OR** phenol, methocarbamol  **OR** methocarbamol  (ROBAXIN ) injection, ondansetron  **OR** ondansetron  (ZOFRAN ) IV,  polyethylene glycol, sodium chloride  flush, sodium phosphate , traMADol     Subjective:   [name] was seen and examined today. Requesting for another pain meds.  Objective:   Vitals:   11/08/24 1934 11/09/24 0550 11/09/24 0742 11/09/24 1132  BP: (!) 170/66 (!) 186/68 (!) 188/75 (!) 144/57  Pulse: 77 73 78   Resp: 18 18 16    Temp: 98.6 F (37 C) 98.7 F (37.1 C) 98.5 F (36.9 C)   TempSrc: Oral Oral Oral   SpO2: 94% 94% 96%   Weight:      Height:        Intake/Output Summary (Last 24 hours) at 11/09/2024 1231 Last data filed at 11/09/2024 1027 Gross per 24 hour  Intake 720 ml  Output 2000 ml  Net -1280 ml   Filed Weights   10/31/24 1832 11/02/24 0144  Weight: 124.3 kg (!) 138.1 kg     Exam General exam: Appears calm and comfortable  Respiratory system: Clear to auscultation. Respiratory effort normal. Cardiovascular system: S1 & S2 heard, RRR. No JVD, Gastrointestinal system: Abdomen is nondistended, soft and nontender.  Central nervous system: Alert and oriented.  Extremities: pedal edema.  Skin: No rashes,  Psychiatry: Mood & affect appropriate.     Data Reviewed:  I have personally reviewed following labs and imaging studies   CBC Lab Results  Component Value Date   WBC 9.3 11/09/2024   RBC 2.86 (L) 11/09/2024   HGB 8.3 (L) 11/09/2024   HCT 25.2 (L) 11/09/2024   MCV 88.1 11/09/2024   MCH 29.0 11/09/2024   PLT 423 (H) 11/09/2024   MCHC 32.9 11/09/2024   RDW 15.3 11/09/2024   LYMPHSABS 0.9 11/09/2024   MONOABS 1.3 (H) 11/09/2024   EOSABS 0.3 11/09/2024   BASOSABS 0.0 11/09/2024     Last metabolic panel Lab Results  Component Value Date   NA 131 (L) 11/09/2024   K 4.1 11/09/2024   CL 94 (L) 11/09/2024   CO2 26 11/09/2024   BUN 14 11/09/2024   CREATININE 0.77 11/09/2024   GLUCOSE 91 11/09/2024   GFRNONAA >60 11/09/2024   GFRAA >60 08/19/2018   CALCIUM  9.8 11/09/2024   PROT 6.8 10/01/2021   ALBUMIN 3.8 10/01/2021   BILITOT 0.6  10/01/2021   ALKPHOS 52 10/01/2021   AST 27 10/01/2021   ALT 16 10/01/2021   ANIONGAP 11 11/09/2024    CBG (last 3)  No results for input(s): GLUCAP in the last 72 hours.    Coagulation Profile: No results for input(s): INR,  PROTIME in the last 168 hours.   Radiology Studies: No results found.     Elgie Butter M.D. [name] 11/09/2024, 12:31 PM  Available via Epic secure chat 7am-7pm After 7 pm, please refer to night coverage provider listed on amion.    "

## 2024-11-09 NOTE — Plan of Care (Signed)

## 2024-11-09 NOTE — Progress Notes (Signed)
 Patient ID: [name], female   DOB: 1945-12-10, 78 y.o.   MRN: [id number] Vital signs are stable Patient is still doing poorly with mobilization I have noted concerns of rehab that she feels the patient herself cannot participate with 3 hours of rehab.  She will likely need skilled nursing placement.  Foley has been removed.  Only concern is that the incision stay dry if she has any episodes of incontinence.  Will monitor closely.  I will see the patient on Monday

## 2024-11-09 NOTE — Progress Notes (Signed)
 Physical Therapy Treatment Patient Details Name: MERCEDE ROLLO MRN: [id number] DOB: October 24, 1945 Today's Date: 11/09/2024   History of Present Illness Pt is a 79 y.o. F presenting to [facility] ED following a fall at home. IMG shows L4-L5 fx with significant distraction. Pt is s/p L4-L5 ORIF. Pt recently admitted 10/01/21 for a fall sustaining T7-T8 fx dislocation and is s/p posterior fixation of T6-T9. PMH is significant for T11 kyphoplasty, DVT, and PE.    PT Comments  Pt tolerated session well, completing multiple sit to stands from an elevated surface with moderate physical assistance of 2 with an AD. Pt then completed lateral scoots at the EOB with maximal physical assistance of 2 via bed pad as pt fatigues quickly. Pt continues to require cueing for maintaining spinal precautions throughout session. PT will continue to treat pt while she is admitted. Patient will benefit from intensive inpatient follow-up therapy, >3 hours/day     If plan is discharge home, recommend the following: Two people to help with walking and/or transfers;Two people to help with bathing/dressing/bathroom;Assistance with cooking/housework;Assist for transportation;Help with stairs or ramp for entrance   Can travel by private vehicle        Equipment Recommendations  [place] lift;Wheelchair cushion (measurements PT);Wheelchair (measurements PT);BSC/3in1    Recommendations for Other Services Rehab consult     Precautions / Restrictions Precautions Precautions: Back;Fall Precaution Booklet Issued: Yes (comment) Recall of Precautions/Restrictions: Impaired Precaution/Restrictions Comments: cues for log roll technique Restrictions Weight Bearing Restrictions Per Provider Order: No     Mobility  Bed Mobility Overal bed mobility: Needs Assistance Bed Mobility: Rolling, Sidelying to Sit, Sit to Sidelying Rolling: Max assist Sidelying to sit: Max assist, +2 for physical assistance, HOB elevated     Sit to sidelying:  Total assist, +2 for physical assistance, +2 for safety/equipment General bed mobility comments: Pt able to roll to the R with maximal physical assistance, reaching across body for opposite bed railing. Pt then requires physical assistance for advancing BLEs off EOB. Pt then able to initiate coming to upright via forearm prop and pushing through R elbow and L hand, but requires physical assistance for trunk. For sit to supine, pt is total A for BLE and trunk management. VC for sequencing    Transfers Overall transfer level: Needs assistance Equipment used: Rolling walker (2 wheels) Transfers: Sit to/from Stand Sit to Stand: Mod assist, +2 physical assistance, +2 safety/equipment, From elevated surface           General transfer comment: STS x2 from elevated EOB with RW and moderate physical assistance of 2 for initial power-up. Pt with increased difficulty bending knees and flexing trunk to get COM over BOS. Pt then completed lateral scoots towards HOB with maximal physical assistance of 2 via bed pad. As pt fatigues, she requires greater assistance.    Ambulation/Gait                   Stairs             Wheelchair Mobility     Tilt Bed    Modified Rankin (Stroke Patients Only)       Balance Overall balance assessment: Needs assistance Sitting-balance support: Feet supported, No upper extremity supported Sitting balance-Leahy Scale: Fair Sitting balance - Comments: able to sit EOB for short bouts without UE support, but as she fatigues pt requires BUE supported on surface and demonstrates posterior lean Postural control: Posterior lean Standing balance support: Bilateral upper extremity supported, During functional activity, Reliant  on assistive device for balance Standing balance-Leahy Scale: Poor Standing balance comment: reliant on external support                            Communication Communication Communication: No apparent difficulties   Cognition Arousal: Alert Behavior During Therapy: WFL for tasks assessed/performed   PT - Cognitive impairments: No apparent impairments                         Following commands: Intact      Cueing Cueing Techniques: Verbal cues, Gestural cues, Visual cues  Exercises      General Comments General comments (skin integrity, edema, etc.): VSS on RA      Pertinent Vitals/Pain Pain Assessment Pain Assessment: Faces Faces Pain Scale: Hurts even more Pain Location: LEs with touch, back incision Pain Descriptors / Indicators: Grimacing, Guarding, Sore Pain Intervention(s): Limited activity within patient's tolerance, Monitored during session    Home Living                          Prior Function            PT Goals (current goals can now be found in the care plan section) Acute Rehab PT Goals Patient Stated Goal: to get stronger PT Goal Formulation: With patient/family Time For Goal Achievement: 11/21/24 Potential to Achieve Goals: Good Progress towards PT goals: Progressing toward goals    Frequency    Min 3X/week      PT Plan      Co-evaluation              AM-PAC PT 6 Clicks Mobility   Outcome Measure  Help needed turning from your back to your side while in a flat bed without using bedrails?: A Lot Help needed moving from lying on your back to sitting on the side of a flat bed without using bedrails?: A Lot Help needed moving to and from a bed to a chair (including a wheelchair)?: Total Help needed standing up from a chair using your arms (e.g., wheelchair or bedside chair)?: Total Help needed to walk in hospital room?: Total Help needed climbing 3-5 steps with a railing? : Total 6 Click Score: 8    End of Session Equipment Utilized During Treatment: Gait belt Activity Tolerance: Patient tolerated treatment well Patient left: in bed;with call bell/phone within reach;with bed alarm set Nurse Communication: Mobility  status;Other (comment) (pt report of dizziness) PT Visit Diagnosis: Other abnormalities of gait and mobility (R26.89);Muscle weakness (generalized) (M62.81);Pain     Time: 8955-8870 PT Time Calculation (min) (ACUTE ONLY): 45 min  Charges:    $Therapeutic Activity: 23-37 mins PT General Charges $$ ACUTE PT VISIT: 1 Visit                     Leontine Hilt DPT Acute Rehab Services [phone] Prefer contact via chat    Leontine NOVAK Valkyrie Guardiola 11/09/2024, 12:49 PM

## 2024-11-10 DIAGNOSIS — R600 Localized edema: Secondary | ICD-10-CM | POA: Diagnosis not present

## 2024-11-10 DIAGNOSIS — S32059D Unspecified fracture of fifth lumbar vertebra, subsequent encounter for fracture with routine healing: Secondary | ICD-10-CM | POA: Diagnosis not present

## 2024-11-10 DIAGNOSIS — N1831 Chronic kidney disease, stage 3a: Secondary | ICD-10-CM | POA: Diagnosis not present

## 2024-11-10 DIAGNOSIS — Z7901 Long term (current) use of anticoagulants: Secondary | ICD-10-CM | POA: Diagnosis not present

## 2024-11-10 LAB — CBC WITH DIFFERENTIAL/PLATELET
Abs Immature Granulocytes: 0.15 10*3/uL — ABNORMAL HIGH (ref 0.00–0.07)
Basophils Absolute: 0 10*3/uL (ref 0.0–0.1)
Basophils Relative: 0 %
Eosinophils Absolute: 0.1 10*3/uL (ref 0.0–0.5)
Eosinophils Relative: 1 %
HCT: 26.7 % — ABNORMAL LOW (ref 36.0–46.0)
Hemoglobin: 8.9 g/dL — ABNORMAL LOW (ref 12.0–15.0)
Immature Granulocytes: 1 %
Lymphocytes Relative: 8 %
Lymphs Abs: 1 10*3/uL (ref 0.7–4.0)
MCH: 28.6 pg (ref 26.0–34.0)
MCHC: 33.3 g/dL (ref 30.0–36.0)
MCV: 85.9 fL (ref 80.0–100.0)
Monocytes Absolute: 1.4 10*3/uL — ABNORMAL HIGH (ref 0.1–1.0)
Monocytes Relative: 12 %
Neutro Abs: 9.1 10*3/uL — ABNORMAL HIGH (ref 1.7–7.7)
Neutrophils Relative %: 78 %
Platelets: 438 10*3/uL — ABNORMAL HIGH (ref 150–400)
RBC: 3.11 MIL/uL — ABNORMAL LOW (ref 3.87–5.11)
RDW: 15.5 % (ref 11.5–15.5)
WBC: 11.8 10*3/uL — ABNORMAL HIGH (ref 4.0–10.5)
nRBC: 0 % (ref 0.0–0.2)

## 2024-11-10 LAB — HEPATIC FUNCTION PANEL
ALT: 22 U/L (ref 0–44)
AST: 24 U/L (ref 15–41)
Albumin: 3.4 g/dL — ABNORMAL LOW (ref 3.5–5.0)
Alkaline Phosphatase: 73 U/L (ref 38–126)
Bilirubin, Direct: 0.3 mg/dL — ABNORMAL HIGH (ref 0.0–0.2)
Indirect Bilirubin: 0.5 mg/dL (ref 0.3–0.9)
Total Bilirubin: 0.8 mg/dL (ref 0.0–1.2)
Total Protein: 6.2 g/dL — ABNORMAL LOW (ref 6.5–8.1)

## 2024-11-10 LAB — URINALYSIS, ROUTINE W REFLEX MICROSCOPIC
Bacteria, UA: NONE SEEN
Bilirubin Urine: NEGATIVE
Glucose, UA: NEGATIVE mg/dL
Hgb urine dipstick: NEGATIVE
Ketones, ur: NEGATIVE mg/dL
Leukocytes,Ua: NEGATIVE
Nitrite: NEGATIVE
Protein, ur: 30 mg/dL — AB
Specific Gravity, Urine: 1.011 (ref 1.005–1.030)
pH: 7 (ref 5.0–8.0)

## 2024-11-10 LAB — BASIC METABOLIC PANEL WITH GFR
Anion gap: 9 (ref 5–15)
BUN: 12 mg/dL (ref 8–23)
CO2: 26 mmol/L (ref 22–32)
Calcium: 9.8 mg/dL (ref 8.9–10.3)
Chloride: 94 mmol/L — ABNORMAL LOW (ref 98–111)
Creatinine, Ser: 0.71 mg/dL (ref 0.44–1.00)
GFR, Estimated: >60 mL/min
Glucose, Bld: 105 mg/dL — ABNORMAL HIGH (ref 70–99)
Potassium: 4.2 mmol/L (ref 3.5–5.1)
Sodium: 129 mmol/L — ABNORMAL LOW (ref 135–145)

## 2024-11-10 LAB — TSH: TSH: 1.46 u[IU]/mL (ref 0.350–4.500)

## 2024-11-10 LAB — OSMOLALITY: Osmolality: 276 mosm/kg (ref 275–295)

## 2024-11-10 LAB — AMMONIA: Ammonia: 19 umol/L (ref 9–35)

## 2024-11-10 MED ORDER — OXYCODONE HCL 5 MG PO TABS: 5.0000 mg | ORAL_TABLET | Freq: Four times a day (QID) | ORAL | Status: DC | PRN

## 2024-11-10 MED ORDER — HALOPERIDOL LACTATE 5 MG/ML IJ SOLN
1.0000 mg | Freq: Four times a day (QID) | INTRAMUSCULAR | Status: DC | PRN
  Administered 2024-11-10 – 2024-11-12 (×3): 2 mg via INTRAVENOUS
  Filled 2024-11-10 (×3): qty 1

## 2024-11-10 NOTE — Plan of Care (Signed)
   Problem: Activity: Goal: Risk for activity intolerance will decrease Outcome: Progressing   Problem: Coping: Goal: Level of anxiety will decrease Outcome: Progressing   Problem: Pain Managment: Goal: General experience of comfort will improve and/or be controlled Outcome: Progressing

## 2024-11-10 NOTE — Progress Notes (Signed)
 "        [facility]                                                                               Dominic Rhome, is a 79 y.o. female, DOB - 1946/04/20, FMW:[id number] Admit date - 10/31/2024    Outpatient Primary MD for the patient is Onita Rush, MD  LOS - 10  days    Brief summary   [name] is an 79 y.o. female past medical history significant for PE DVT on Xarelto , history of an IVC filter, multiple back surgeries mild aortic stenosis essential hypertension morbid obesity presents today after mechanical fall CT of the lumbar spine showed Chance fracture of old L4-L5 fusion with significant distraction.  Neurosurgery was consulted who recommended bedrest and a 30 degree angle for pain control and rehab.    Assessment & Plan    Assessment and Plan:   Acute close lumbar fracture S/p open reduction at the L4-L5. Repeat CT of the lumbar spine without contrast shows Recent hyperextension injury at L5-S1 status post interval posterior fusion. Unchanged anterior distraction of the L5-S1 disc space. Regional postoperative changes and retroperitoneal hematoma. Neurosurgery on board recommending therapy evaluations at this time Pain controlled. Waiting for her to progress with physical therapy to see hopefully she can be safely discharged.  Patient in pain this morning. She is alert and was able to answer all questions.    AKI and hyperkalemia Resolved. Creatinine back to baseline.    Acute anemia from blood loss  Secondary to retroperitoneal hematoma. Continue to monitor hemoglobin and transfuse to keep it greater than 7 Hemoglobin around 8.3.    History of PE/DVT and an IVC filter in place Need to hold Xarelto  and any kind of anticoagulation due to retroperitoneal bleed and a drop of hemoglobin from 10-7.8. Hemoglobin stabilized at 8.   Urinary retention:  Patient had post voiding residual of more than 200 .  Will replace foley, obtain UA and urine cultures.   Discussed the plan with the husband.    Body mass index is 52.26 kg/m. Morbid obesity Poor prognostic factor   Hypothyroidism Continue Synthroid   Lymphedema Status post IVC filter   Hyponatremia Unclear etiology.  Get  urine osmo, urine sodium. Serum osmolality. Sodium stabilized at 129.   Hypertension:  BP parameters are elevated. Added losartan  to atenolol  .  Added hydralazine  prn.    Agitation/ delirium Will order 1 mg of haldol  and monitor.  Suspect from urinary retention.      Estimated body mass index is 52.26 kg/m as calculated from the following:   Height as of this encounter: 5' 4 (1.626 m).   Weight as of this encounter: 138.1 kg.  Code Status: Full code DVT Prophylaxis:  Place and maintain sequential compression device Start: 11/06/24 0848 SCD's Start: 11/01/24 2359   Level of Care: Level of care: Med-Surg Family Communication: discussed the plan with the husband over the phone.   Disposition Plan:     Remains inpatient appropriate:  pending.    Procedures:  S/p lumbar fusion surgery.   Consultants:   NS   Antimicrobials:   Anti-infectives (From admission, onward)  Start     Dose/Rate Route Frequency Ordered Stop   11/09/24 2200  cephALEXin  (KEFLEX ) capsule 500 mg        500 mg Oral Every 8 hours 11/09/24 1519 11/10/24 1434   11/06/24 1400  cephALEXin  (KEFLEX ) capsule 500 mg  Status:  Discontinued        500 mg Oral Every 8 hours 11/06/24 1036 11/09/24 1519   11/02/24 0045  ceFAZolin  (ANCEF ) IVPB 2g/100 mL premix        2 g 200 mL/hr over 30 Minutes Intravenous Every 8 hours 11/01/24 2358 11/02/24 0930        Medications  Scheduled Meds:  acidophilus  1 capsule Oral q AM   ALPRAZolam   0.25 mg Oral BID   atenolol   25 mg Oral Daily   Chlorhexidine  Gluconate Cloth  6 each Topical Daily   docusate sodium   100 mg Oral BID   escitalopram   5 mg Oral QHS   famotidine   40 mg Oral QHS   feeding supplement  237 mL Oral BID BM    gabapentin   600 mg Oral TID   levothyroxine   25 mcg Oral Q M,W,F   losartan   50 mg Oral Daily   pantoprazole   40 mg Oral Daily   senna  1 tablet Oral BID   simvastatin   20 mg Oral QHS   sodium chloride  flush  3 mL Intravenous Q12H   Continuous Infusions:  sodium chloride      PRN Meds:.acetaminophen  **OR** acetaminophen , alum & mag hydroxide-simeth, atenolol  **AND** atenolol , bisacodyl , bisacodyl , hydrALAZINE , HYDROmorphone  (DILAUDID ) injection, melatonin, menthol  **OR** phenol, methocarbamol  **OR** methocarbamol  (ROBAXIN ) injection, ondansetron  **OR** ondansetron  (ZOFRAN ) IV, polyethylene glycol, sodium chloride  flush, sodium phosphate , traMADol     Subjective:   [name] was seen and examined today. Restless. Hypertensive, alert and answering questions appropriately.   Objective:   Vitals:   11/10/24 0422 11/10/24 0740 11/10/24 1019 11/10/24 1423  BP: 130/69 (!) 151/45 (!) 180/81 (!) 170/53  Pulse: 70 65 67 63  Resp: 15 18 17 18   Temp: 98.4 F (36.9 C) 98.4 F (36.9 C)  98.1 F (36.7 C)  TempSrc: Oral     SpO2: 94% 95% 95% 94%  Weight:      Height:        Intake/Output Summary (Last 24 hours) at 11/10/2024 1505 Last data filed at 11/10/2024 0900 Gross per 24 hour  Intake 480 ml  Output 700 ml  Net -220 ml   Filed Weights   10/31/24 1832 11/02/24 0144  Weight: 124.3 kg (!) 138.1 kg     Exam General exam: Appears calm and comfortable  Respiratory system: Clear to auscultation. Respiratory effort normal. Cardiovascular system: S1 & S2 heard, RRR.  Gastrointestinal system: Abdomen is nondistended, soft and nontender.  Central nervous system: Alert and oriented.  Extremities: pedal edema.  Skin: No rashes,  Psychiatry:  Mood & affect appropriate.      Data Reviewed:  I have personally reviewed following labs and imaging studies   CBC Lab Results  Component Value Date   WBC 9.3 11/09/2024   RBC 2.86 (L) 11/09/2024   HGB 8.3 (L) 11/09/2024   HCT 25.2  (L) 11/09/2024   MCV 88.1 11/09/2024   MCH 29.0 11/09/2024   PLT 423 (H) 11/09/2024   MCHC 32.9 11/09/2024   RDW 15.3 11/09/2024   LYMPHSABS 0.9 11/09/2024   MONOABS 1.3 (H) 11/09/2024   EOSABS 0.3 11/09/2024   BASOSABS 0.0 11/09/2024     Last metabolic panel Lab  Results  Component Value Date   NA 129 (L) 11/10/2024   K 4.2 11/10/2024   CL 94 (L) 11/10/2024   CO2 26 11/10/2024   BUN 12 11/10/2024   CREATININE 0.71 11/10/2024   GLUCOSE 105 (H) 11/10/2024   GFRNONAA >60 11/10/2024   GFRAA >60 08/19/2018   CALCIUM  9.8 11/10/2024   PROT 6.8 10/01/2021   ALBUMIN 3.8 10/01/2021   BILITOT 0.6 10/01/2021   ALKPHOS 52 10/01/2021   AST 27 10/01/2021   ALT 16 10/01/2021   ANIONGAP 9 11/10/2024    CBG (last 3)  No results for input(s): GLUCAP in the last 72 hours.    Coagulation Profile: No results for input(s): INR, PROTIME in the last 168 hours.   Radiology Studies: No results found.     Elgie Butter M.D. [facility] 11/10/2024, 3:05 PM  Available via Epic secure chat 7am-7pm After 7 pm, please refer to night coverage provider listed on amion.    "

## 2024-11-11 ENCOUNTER — Inpatient Hospital Stay (HOSPITAL_COMMUNITY)

## 2024-11-11 DIAGNOSIS — Z7901 Long term (current) use of anticoagulants: Secondary | ICD-10-CM | POA: Diagnosis not present

## 2024-11-11 DIAGNOSIS — I2699 Other pulmonary embolism without acute cor pulmonale: Secondary | ICD-10-CM | POA: Diagnosis not present

## 2024-11-11 DIAGNOSIS — S32059D Unspecified fracture of fifth lumbar vertebra, subsequent encounter for fracture with routine healing: Secondary | ICD-10-CM | POA: Diagnosis not present

## 2024-11-11 DIAGNOSIS — N1831 Chronic kidney disease, stage 3a: Secondary | ICD-10-CM | POA: Diagnosis not present

## 2024-11-11 LAB — BASIC METABOLIC PANEL WITH GFR
Anion gap: 12 (ref 5–15)
BUN: 10 mg/dL (ref 8–23)
CO2: 24 mmol/L (ref 22–32)
Calcium: 10 mg/dL (ref 8.9–10.3)
Chloride: 94 mmol/L — ABNORMAL LOW (ref 98–111)
Creatinine, Ser: 0.79 mg/dL (ref 0.44–1.00)
GFR, Estimated: >60 mL/min
Glucose, Bld: 118 mg/dL — ABNORMAL HIGH (ref 70–99)
Potassium: 4.3 mmol/L (ref 3.5–5.1)
Sodium: 130 mmol/L — ABNORMAL LOW (ref 135–145)

## 2024-11-11 LAB — CBC WITH DIFFERENTIAL/PLATELET
Abs Immature Granulocytes: 0.13 10*3/uL — ABNORMAL HIGH (ref 0.00–0.07)
Basophils Absolute: 0 10*3/uL (ref 0.0–0.1)
Basophils Relative: 0 %
Eosinophils Absolute: 0.1 10*3/uL (ref 0.0–0.5)
Eosinophils Relative: 1 %
HCT: 27.5 % — ABNORMAL LOW (ref 36.0–46.0)
Hemoglobin: 9.1 g/dL — ABNORMAL LOW (ref 12.0–15.0)
Immature Granulocytes: 1 %
Lymphocytes Relative: 7 %
Lymphs Abs: 0.9 10*3/uL (ref 0.7–4.0)
MCH: 28.2 pg (ref 26.0–34.0)
MCHC: 33.1 g/dL (ref 30.0–36.0)
MCV: 85.1 fL (ref 80.0–100.0)
Monocytes Absolute: 1.4 10*3/uL — ABNORMAL HIGH (ref 0.1–1.0)
Monocytes Relative: 11 %
Neutro Abs: 10.5 10*3/uL — ABNORMAL HIGH (ref 1.7–7.7)
Neutrophils Relative %: 80 %
Platelets: 431 10*3/uL — ABNORMAL HIGH (ref 150–400)
RBC: 3.23 MIL/uL — ABNORMAL LOW (ref 3.87–5.11)
RDW: 15.4 % (ref 11.5–15.5)
WBC: 12.9 10*3/uL — ABNORMAL HIGH (ref 4.0–10.5)
nRBC: 0 % (ref 0.0–0.2)

## 2024-11-11 LAB — URINE CULTURE: Culture: NO GROWTH

## 2024-11-11 MED ORDER — OLANZAPINE 2.5 MG PO TABS
2.5000 mg | ORAL_TABLET | Freq: Every day | ORAL | Status: DC
  Filled 2024-11-11: qty 1

## 2024-11-11 MED ORDER — OLANZAPINE 2.5 MG PO TABS: 2.5000 mg | ORAL_TABLET | Freq: Every evening | ORAL | Status: DC | PRN

## 2024-11-11 MED ORDER — ESCITALOPRAM OXALATE 5 MG PO TABS
2.5000 mg | ORAL_TABLET | Freq: Every day | ORAL | Status: DC
  Filled 2024-11-11: qty 1

## 2024-11-11 MED ORDER — LOSARTAN POTASSIUM 50 MG PO TABS
100.0000 mg | ORAL_TABLET | Freq: Every day | ORAL | Status: DC
  Administered 2024-11-13 – 2024-11-20 (×8): 100 mg via ORAL
  Filled 2024-11-11 (×9): qty 2

## 2024-11-11 MED ORDER — SODIUM CHLORIDE 0.9 % IV SOLN: INTRAVENOUS | Status: DC

## 2024-11-11 MED ORDER — ESCITALOPRAM OXALATE 10 MG PO TABS
5.0000 mg | ORAL_TABLET | ORAL | Status: DC
  Administered 2024-11-14 – 2024-11-18 (×3): 5 mg via ORAL
  Filled 2024-11-11 (×3): qty 1

## 2024-11-11 MED ORDER — ALPRAZOLAM 0.5 MG PO TABS
0.5000 mg | ORAL_TABLET | Freq: Two times a day (BID) | ORAL | Status: DC
  Administered 2024-11-11 – 2024-11-20 (×16): 0.5 mg via ORAL
  Filled 2024-11-11 (×17): qty 1

## 2024-11-11 MED ORDER — HYDROCODONE-ACETAMINOPHEN 5-325 MG PO TABS
1.0000 | ORAL_TABLET | Freq: Four times a day (QID) | ORAL | Status: DC | PRN
  Administered 2024-11-11 – 2024-11-20 (×22): 1 via ORAL
  Filled 2024-11-11 (×24): qty 1

## 2024-11-11 NOTE — TOC Progression Note (Signed)
 Transition of Care [facility]) - Progression Note    Patient Details  Name: [name] MRN: [id number] Date of Birth: September 16, 1946  Transition of Care [facility]) CM/SW Contact  Inocente GORMAN Kindle, LCSW Phone Number: 11/11/2024, 12:24 PM  Clinical Narrative:    CSW following for patient's determination of CIR versus SNF. SNF referrals sent out for review.    Expected Discharge Plan: IP Rehab Facility Barriers to Discharge: Continued Medical Work up, [employer] and Services   Discharge Planning Services: CM Consult   Living arrangements for the past 2 months: Single Family Home                                       Social Drivers of Health (SDOH) Interventions SDOH Screenings   Food Insecurity: No Food Insecurity (11/01/2024)  Housing: Low Risk (11/01/2024)  Transportation Needs: No Transportation Needs (11/01/2024)  Utilities: Not At Risk (11/01/2024)  Social Connections: Socially Integrated (11/01/2024)  Tobacco Use: Low Risk (11/01/2024)    Readmission Risk Interventions     No data to display

## 2024-11-11 NOTE — Progress Notes (Signed)
 "        [name]                                                                               [name], is a 79 y.o. female, DOB - August 09, 1946, FMW:[id number] Admit date - 10/31/2024    Outpatient Primary MD for the patient is Onita Rush, MD  LOS - 11  days    Brief summary   [name] is an 79 y.o. female past medical history significant for PE DVT on Xarelto , history of an IVC filter, multiple back surgeries mild aortic stenosis essential hypertension morbid obesity presents today after mechanical fall CT of the lumbar spine showed Chance fracture of old L4-L5 fusion with significant distraction.  Neurosurgery was consulted who recommended bedrest and a 30 degree angle for pain control and rehab.    Assessment & Plan    Assessment and Plan:   Acute close lumbar fracture S/p open reduction at the L4-L5. Repeat CT of the lumbar spine without contrast shows Recent hyperextension injury at L5-S1 status post interval posterior fusion. Unchanged anterior distraction of the L5-S1 disc space. Regional postoperative changes and retroperitoneal hematoma. Neurosurgery on board recommending therapy evaluations at this time Pain controlled. Waiting for her to progress with physical therapy to see hopefully she can be safely discharged.    Acute encephalopathy, unclear etiology.  Suspect hospital delirium  Some hallucinations too.  Patient able to move all her extremities.  She is alert and oriented to person o n ly.  CT head with out contrast is negative.  Ammonia levels is wnl.  Redirection and frequent orientation recommended.     AKI and hyperkalemia Resolved. Creatinine back to baseline.    Acute anemia from blood loss  Secondary to retroperitoneal hematoma. Continue to monitor hemoglobin and transfuse to keep it greater than 7 Continue to monitor.      History of PE/DVT and an IVC filter in place Need to hold Xarelto  and any kind of  anticoagulation due to retroperitoneal bleed and a drop of hemoglobin from 10-7.8. Hemoglobin stabilized at 8 to 9 today.  Discussed with the husband , about her retroperitoneal hematoma and the reason for holding anti coagulation, if her hemoglobin remains stable in am, will slowly restart anticoagulation with IV heparin  and if no change in retroperitoneal hematoma, will transition to xarelto  on discharge. Husband/ spouse agreeable to the plan.   Urinary retention:  Patient had post voiding residual of more than 200 .  Will replace foley, obtain UA and urine cultures.  Discussed the plan with the husband.    Body mass index is 52.26 kg/m. Morbid obesity Poor prognostic factor   Hypothyroidism Continue Synthroid   Lymphedema Status post IVC filter   Hyponatremia Unclear etiology. Suspect from SIADH.  Get  urine osmo, urine sodium. Serum osmolality. Sodium stabilized at 130 with IV fludis.   Hypertension:  BP parameters are high. Increase cozaar  to 100 mg daily.  Patient has prn hydralazine .        Estimated body mass index is 52.26 kg/m as calculated from the following:   Height as of this encounter: 5' 4 (1.626 m).   Weight as  of this encounter: 138.1 kg.  Code Status: Full code DVT Prophylaxis:  Place and maintain sequential compression device Start: 11/06/24 0848 SCD's Start: 11/01/24 2359   Level of Care: Level of care: Med-Surg Family Communication: discussed the plan with the husband over the phone.   Disposition Plan:     Remains inpatient appropriate:  pending.    Procedures:  S/p lumbar fusion surgery.   Consultants:   NS   Antimicrobials:   Anti-infectives (From admission, onward)    Start     Dose/Rate Route Frequency Ordered Stop   11/09/24 2200  cephALEXin  (KEFLEX ) capsule 500 mg        500 mg Oral Every 8 hours 11/09/24 1519 11/10/24 1434   11/06/24 1400  cephALEXin  (KEFLEX ) capsule 500 mg  Status:  Discontinued        500 mg Oral Every 8  hours 11/06/24 1036 11/09/24 1519   11/02/24 0045  ceFAZolin  (ANCEF ) IVPB 2g/100 mL premix        2 g 200 mL/hr over 30 Minutes Intravenous Every 8 hours 11/01/24 2358 11/02/24 0930        Medications  Scheduled Meds:  acidophilus  1 capsule Oral q AM   ALPRAZolam   0.5 mg Oral BID   atenolol   25 mg Oral Daily   Chlorhexidine  Gluconate Cloth  6 each Topical Daily   docusate sodium   100 mg Oral BID   escitalopram   2.5 mg Oral QHS   famotidine   40 mg Oral QHS   feeding supplement  237 mL Oral BID BM   gabapentin   600 mg Oral TID   levothyroxine   25 mcg Oral Q M,W,F   losartan   50 mg Oral Daily   pantoprazole   40 mg Oral Daily   senna  1 tablet Oral BID   simvastatin   20 mg Oral QHS   sodium chloride  flush  3 mL Intravenous Q12H   Continuous Infusions:  sodium chloride      sodium chloride      PRN Meds:.acetaminophen  **OR** acetaminophen , alum & mag hydroxide-simeth, atenolol  **AND** atenolol , bisacodyl , bisacodyl , haloperidol  lactate, hydrALAZINE , HYDROcodone -acetaminophen , HYDROmorphone  (DILAUDID ) injection, melatonin, menthol  **OR** phenol, methocarbamol  **OR** methocarbamol  (ROBAXIN ) injection, OLANZapine , ondansetron  **OR** ondansetron  (ZOFRAN ) IV, polyethylene glycol, sodium chloride  flush, sodium phosphate     Subjective:   [name] was seen and examined today.continues to be delirious, confused and oriented to person only.  Objective:   Vitals:   11/10/24 1423 11/10/24 2006 11/11/24 0314 11/11/24 1204  BP: (!) 170/53 (!) 167/70 (!) 151/74 (!) 170/73  Pulse: 63 76 76 74  Resp: 18 19 18 18   Temp: 98.1 F (36.7 C) 99 F (37.2 C) 98.2 F (36.8 C) 98.1 F (36.7 C)  TempSrc:    Oral  SpO2: 94% 93% 94% 96%  Weight:      Height:        Intake/Output Summary (Last 24 hours) at 11/11/2024 1213 Last data filed at 11/11/2024 0314 Gross per 24 hour  Intake --  Output 1100 ml  Net -1100 ml   Filed Weights   10/31/24 1832 11/02/24 0144  Weight: 124.3 kg (!)  138.1 kg     Exam General exam: Ill appearing elderly lady , not in distress.  Respiratory system: Clear to auscultation. Respiratory effort normal. Cardiovascular system: S1 & S2 heard, RRR.  Gastrointestinal system: Abdomen is nondistended, soft and nontender.  Central nervous system: Alert and oriented to person only, confused, delirious.  Extremities: bilateral chronic leg edema.  Skin: No rashes,  Psychiatry: restless, confused.       Data Reviewed:  I have personally reviewed following labs and imaging studies   CBC Lab Results  Component Value Date   WBC 12.9 (H) 11/11/2024   RBC 3.23 (L) 11/11/2024   HGB 9.1 (L) 11/11/2024   HCT 27.5 (L) 11/11/2024   MCV 85.1 11/11/2024   MCH 28.2 11/11/2024   PLT 431 (H) 11/11/2024   MCHC 33.1 11/11/2024   RDW 15.4 11/11/2024   LYMPHSABS 0.9 11/11/2024   MONOABS 1.4 (H) 11/11/2024   EOSABS 0.1 11/11/2024   BASOSABS 0.0 11/11/2024     Last metabolic panel Lab Results  Component Value Date   NA 129 (L) 11/10/2024   K 4.2 11/10/2024   CL 94 (L) 11/10/2024   CO2 26 11/10/2024   BUN 12 11/10/2024   CREATININE 0.71 11/10/2024   GLUCOSE 105 (H) 11/10/2024   GFRNONAA >60 11/10/2024   GFRAA >60 08/19/2018   CALCIUM  9.8 11/10/2024   PROT 6.2 (L) 11/10/2024   ALBUMIN 3.4 (L) 11/10/2024   BILITOT 0.8 11/10/2024   ALKPHOS 73 11/10/2024   AST 24 11/10/2024   ALT 22 11/10/2024   ANIONGAP 9 11/10/2024    CBG (last 3)  No results for input(s): GLUCAP in the last 72 hours.    Coagulation Profile: No results for input(s): INR, PROTIME in the last 168 hours.   Radiology Studies: DG CHEST PORT 1 VIEW Result Date: 11/11/2024 CLINICAL DATA:  [id number] Fever [id number] EXAM: PORTABLE CHEST 1 VIEW COMPARISON:  October 31, 2024, October 01, 2021 FINDINGS: The cardiomediastinal silhouette is unchanged in contour.Atherosclerotic calcifications. No pleural effusion. No pneumothorax. No acute pleuroparenchymal abnormality.  IMPRESSION: No acute cardiopulmonary abnormality. Electronically Signed   By: Corean Salter M.D.   On: 11/11/2024 11:05       Elgie Butter M.D. [name] 11/11/2024, 12:13 PM  Available via Epic secure chat 7am-7pm After 7 pm, please refer to night coverage provider listed on amion.    "

## 2024-11-11 NOTE — Progress Notes (Signed)
 Pt very confused, not combative but did question my actions and and appeared paranoid. Pt did not sleep the night before that I am aware of or last night except for a short while after the PRN haldol .  Pt appears terrified at times that she is falling out of bed or going to fall. The NT tried to assist to reposition and pt panicked-saying please no, I'm falling', I was unable to convince her she was safe so we stopped.  Pt keeps saying she's leaving, going to Christmas party-I attempted many times to reorient, but pt is not able to grasp reality at this time. Spoke w her husband Jodie, he was asking if the urinalysis had came back-he said yesterday (24th)was the 1st time he had ever heard her say anything that didn't make sense, or witnessed her being confused.   We clustered care and encouraged pt to rest, but she has been unable top relax.

## 2024-11-11 NOTE — [place] FL2 (Signed)
 " [place]  MEDICAID FL2 LEVEL OF CARE FORM     IDENTIFICATION  Patient Name: [name] Birthdate: 1946/08/22 Sex: female Admission Date (Current Location): 10/31/2024  [facility] and [place] Number:  Producer, Television/film/video and Address:  [facility] [place], [address], [place], [place] [address]      Provider Number: [id number]  Attending Physician Name and Address:  Cherlyn Labella, MD  Relative Name and Phone Number:       Current Level of Care: Hospital Recommended Level of Care: Skilled Nursing Facility Prior Approval Number:    Date Approved/Denied:   PASRR Number: [id number]  Discharge Plan: SNF    Current Diagnoses: Patient Active Problem List   Diagnosis Date Noted   Closed lumbar vertebral fracture (HCC) 10/31/2024   Anemia, unspecified 09/17/2024   Leg edema 07/02/2024   Nonrheumatic aortic valve stenosis 07/02/2024   Iron deficiency anemia due to chronic blood loss 04/11/2023   Deficiency anemia 04/11/2023   Thoracic spine instability 10/01/2021   Thoracic spine pain 04/17/2021   Thoracic radiculopathy 04/17/2021   Compression fracture of body of thoracic vertebra (HCC) 03/03/2021   Coronary artery disease involving native coronary artery 12/12/2018   Chest pain 08/18/2018   Essential hypertension 08/18/2018   Rupture of quadriceps tendon 12/06/2016   Chronic kidney disease, stage III (moderate) (HCC) 07/27/2016   Hiatal hernia 07/27/2016   Hypothyroidism 07/27/2016   Hyperlipidemia 07/27/2016   History of DVT (deep vein thrombosis) 07/27/2016   Recurrent pulmonary emboli (HCC) 07/27/2016   Chronic anticoagulation 07/27/2016   Chronic lower back pain 07/27/2016   Arthrosis of left acromioclavicular joint 03/31/2015   Biceps tendonitis on left 03/31/2015   Chronic right shoulder pain 03/31/2015   Left shoulder pain 03/31/2015   S/P left hip revision 12/07/2013   Visit for suture removal 09/25/2013   Aneurysm of unspecified site  08/28/2013    Orientation RESPIRATION BLADDER Height & Weight     Self, Time, Situation, Place  Normal External catheter Weight: (!) 304 lb 7.3 oz (138.1 kg) Height:  5' 4 (162.6 cm)  BEHAVIORAL SYMPTOMS/MOOD NEUROLOGICAL BOWEL NUTRITION STATUS      Continent Diet (refer to d/c summary)  AMBULATORY STATUS COMMUNICATION OF NEEDS Skin   Extensive Assist Verbally Normal (Fracture dislocation L4-L5 status post fusion 1/15)                       Personal Care Assistance Level of Assistance  Bathing, Feeding, Dressing Bathing Assistance: Maximum assistance Feeding assistance: Independent Dressing Assistance: Maximum assistance     Functional Limitations Info  Sight, Hearing, Speech Sight Info: Adequate Hearing Info: Adequate Speech Info: Adequate    SPECIAL CARE FACTORS FREQUENCY  PT (By licensed PT), OT (By licensed OT)     PT Frequency: 5x/week evaluate and treat OT Frequency: 5x/week evaluate and treat            Contractures Contractures Info: Not present    Additional Factors Info  Code Status, Allergies Code Status Info: Full Code Allergies Info: : Celecoxib            Current Medications (11/11/2024):  This is the current hospital active medication list Current Facility-Administered Medications  Medication Dose Route Frequency Provider Last Rate Last Admin   0.9 %  sodium chloride  infusion  10 mL/hr Intravenous Once [name], MD       0.9 %  sodium chloride  infusion   Intravenous Continuous Akula, Vijaya, MD  acetaminophen  (TYLENOL ) tablet 650 mg  650 mg Oral Q4H PRN [name], MD   650 mg at 11/10/24 0204   Or   acetaminophen  (TYLENOL ) suppository 650 mg  650 mg Rectal Q4H PRN [name], MD       acidophilus (RISAQUAD) capsule 1 capsule  1 capsule Oral q AM [name], MD   1 capsule at 11/11/24 1140   ALPRAZolam  (XANAX ) tablet 0.5 mg  0.5 mg Oral BID Akula, Vijaya, MD       alum & mag hydroxide-simeth (MAALOX/MYLANTA) 200-200-20  MG/5ML suspension 30 mL  30 mL Oral Q6H PRN [name], MD       atenolol  (TENORMIN ) tablet 25 mg  25 mg Oral Daily [name], MD   25 mg at 11/11/24 1140   And   atenolol  (TENORMIN ) tablet 25 mg  25 mg Oral Daily PRN [name], MD   25 mg at 11/11/24 0334   bisacodyl  (DULCOLAX) EC tablet 5 mg  5 mg Oral Daily PRN [name], MD   5 mg at 11/05/24 0605   bisacodyl  (DULCOLAX) suppository 10 mg  10 mg Rectal Daily PRN [name], MD   10 mg at 11/05/24 1610   Chlorhexidine  Gluconate Cloth 2 % PADS 6 each  6 each Topical Daily Odell Celinda Balo, MD   6 each at 11/11/24 1141   docusate sodium  (COLACE) capsule 100 mg  100 mg Oral BID [name], MD   100 mg at 11/11/24 1140   escitalopram  (LEXAPRO ) tablet 2.5 mg  2.5 mg Oral QHS Akula, Vijaya, MD       famotidine  (PEPCID ) tablet 40 mg  40 mg Oral QHS [name], MD   40 mg at 11/10/24 1937   feeding supplement (ENSURE PLUS HIGH PROTEIN) liquid 237 mL  237 mL Oral BID BM Akula, Vijaya, MD   237 mL at 11/11/24 1141   gabapentin  (NEURONTIN ) capsule 600 mg  600 mg Oral TID [name], MD   600 mg at 11/11/24 1140   haloperidol  lactate (HALDOL ) injection 1-2 mg  1-2 mg Intravenous Q6H PRN Akula, Vijaya, MD   2 mg at 11/11/24 0124   hydrALAZINE  (APRESOLINE ) injection 5 mg  5 mg Intravenous Q8H PRN Akula, Vijaya, MD       HYDROcodone -acetaminophen  (NORCO/VICODIN) 5-325 MG per tablet 1 tablet  1 tablet Oral Q6H PRN Akula, Vijaya, MD       HYDROmorphone  (DILAUDID ) injection 1 mg  1 mg Intravenous Q4H PRN Odell Celinda Balo, MD   1 mg at 11/11/24 9692   levothyroxine  (SYNTHROID ) tablet 25 mcg  25 mcg Oral Q M,W,F [name], MD   25 mcg at 11/09/24 9461   losartan  (COZAAR ) tablet 50 mg  50 mg Oral Daily Akula, Vijaya, MD   50 mg at 11/11/24 1140   melatonin tablet 3 mg  3 mg Oral QHS PRN [name], MD   3 mg at 11/10/24 1937   menthol  (CEPACOL) lozenge 3 mg  1 lozenge Oral PRN [name], MD       Or   phenol  (CHLORASEPTIC) mouth spray 1 spray  1 spray Mouth/Throat PRN [name], MD       methocarbamol  (ROBAXIN ) tablet 500 mg  500 mg Oral Q6H PRN [name], MD   500 mg at 11/11/24 1143   Or   methocarbamol  (ROBAXIN ) injection 500 mg  500 mg Intravenous Q6H PRN [name], MD   500 mg at 11/08/24 2007   OLANZapine  (ZYPREXA )  tablet 2.5 mg  2.5 mg Oral QHS PRN Akula, Vijaya, MD       ondansetron  (ZOFRAN ) tablet 4 mg  4 mg Oral Q6H PRN [name], MD   4 mg at 11/09/24 0750   Or   ondansetron  (ZOFRAN ) injection 4 mg  4 mg Intravenous Q6H PRN [name], MD       pantoprazole  (PROTONIX ) EC tablet 40 mg  40 mg Oral Daily Elsner, Henry, MD   40 mg at 11/11/24 1140   polyethylene glycol (MIRALAX  / GLYCOLAX ) packet 17 g  17 g Oral Daily PRN [name], MD   17 g at 11/04/24 1841   senna (SENOKOT) tablet 8.6 mg  1 tablet Oral BID [name], MD   8.6 mg at 11/11/24 1140   simvastatin  (ZOCOR ) tablet 20 mg  20 mg Oral QHS Akula, Vijaya, MD   20 mg at 11/10/24 8062   sodium chloride  flush (NS) 0.9 % injection 3 mL  3 mL Intravenous Q12H Elsner, Henry, MD   3 mL at 11/10/24 2100   sodium chloride  flush (NS) 0.9 % injection 3 mL  3 mL Intravenous PRN [name], MD       sodium phosphate  (FLEET) enema 1 enema  1 enema Rectal Once PRN [name], MD         Discharge Medications: Please see discharge summary for a list of discharge medications.  Relevant Imaging Results:  Relevant Lab Results:   Additional Information SS# [id number]  Inocente GORMAN Kindle, LCSW     "

## 2024-11-11 NOTE — Plan of Care (Signed)
  Problem: Pain Managment: Goal: General experience of comfort will improve and/or be controlled Outcome: Progressing   Problem: Safety: Goal: Ability to remain free from injury will improve Outcome: Progressing

## 2024-11-12 ENCOUNTER — Inpatient Hospital Stay (HOSPITAL_COMMUNITY)

## 2024-11-12 DIAGNOSIS — S32059D Unspecified fracture of fifth lumbar vertebra, subsequent encounter for fracture with routine healing: Secondary | ICD-10-CM | POA: Diagnosis not present

## 2024-11-12 DIAGNOSIS — I2699 Other pulmonary embolism without acute cor pulmonale: Secondary | ICD-10-CM | POA: Diagnosis not present

## 2024-11-12 DIAGNOSIS — N1831 Chronic kidney disease, stage 3a: Secondary | ICD-10-CM | POA: Diagnosis not present

## 2024-11-12 DIAGNOSIS — Z7901 Long term (current) use of anticoagulants: Secondary | ICD-10-CM | POA: Diagnosis not present

## 2024-11-12 LAB — CBC WITH DIFFERENTIAL/PLATELET
Abs Immature Granulocytes: 0.12 10*3/uL — ABNORMAL HIGH (ref 0.00–0.07)
Basophils Absolute: 0 10*3/uL (ref 0.0–0.1)
Basophils Relative: 0 %
Eosinophils Absolute: 0.1 10*3/uL (ref 0.0–0.5)
Eosinophils Relative: 1 %
HCT: 28.9 % — ABNORMAL LOW (ref 36.0–46.0)
Hemoglobin: 9.4 g/dL — ABNORMAL LOW (ref 12.0–15.0)
Immature Granulocytes: 1 %
Lymphocytes Relative: 6 %
Lymphs Abs: 0.9 10*3/uL (ref 0.7–4.0)
MCH: 28.3 pg (ref 26.0–34.0)
MCHC: 32.5 g/dL (ref 30.0–36.0)
MCV: 87 fL (ref 80.0–100.0)
Monocytes Absolute: 1.5 10*3/uL — ABNORMAL HIGH (ref 0.1–1.0)
Monocytes Relative: 10 %
Neutro Abs: 12.3 10*3/uL — ABNORMAL HIGH (ref 1.7–7.7)
Neutrophils Relative %: 82 %
Platelets: 431 10*3/uL — ABNORMAL HIGH (ref 150–400)
RBC: 3.32 MIL/uL — ABNORMAL LOW (ref 3.87–5.11)
RDW: 15.8 % — ABNORMAL HIGH (ref 11.5–15.5)
WBC: 14.9 10*3/uL — ABNORMAL HIGH (ref 4.0–10.5)
nRBC: 0 % (ref 0.0–0.2)

## 2024-11-12 LAB — COMPREHENSIVE METABOLIC PANEL WITH GFR
ALT: 21 U/L (ref 0–44)
AST: 30 U/L (ref 15–41)
Albumin: 3.6 g/dL (ref 3.5–5.0)
Alkaline Phosphatase: 77 U/L (ref 38–126)
Anion gap: 12 (ref 5–15)
BUN: 13 mg/dL (ref 8–23)
CO2: 25 mmol/L (ref 22–32)
Calcium: 10.1 mg/dL (ref 8.9–10.3)
Chloride: 95 mmol/L — ABNORMAL LOW (ref 98–111)
Creatinine, Ser: 0.82 mg/dL (ref 0.44–1.00)
GFR, Estimated: >60 mL/min
Glucose, Bld: 114 mg/dL — ABNORMAL HIGH (ref 70–99)
Potassium: 4 mmol/L (ref 3.5–5.1)
Sodium: 132 mmol/L — ABNORMAL LOW (ref 135–145)
Total Bilirubin: 0.9 mg/dL (ref 0.0–1.2)
Total Protein: 6.5 g/dL (ref 6.5–8.1)

## 2024-11-12 LAB — FOLATE: Folate: >20 ng/mL

## 2024-11-12 LAB — VITAMIN B12: Vitamin B-12: 337 pg/mL (ref 180–914)

## 2024-11-12 MED ORDER — LORAZEPAM 2 MG/ML IJ SOLN
0.5000 mg | Freq: Once | INTRAMUSCULAR | Status: AC
  Administered 2024-11-12: 1 mg via INTRAVENOUS
  Filled 2024-11-12: qty 1

## 2024-11-12 MED ORDER — QUETIAPINE FUMARATE 25 MG PO TABS
25.0000 mg | ORAL_TABLET | Freq: Two times a day (BID) | ORAL | Status: DC
  Administered 2024-11-13: 25 mg via ORAL
  Filled 2024-11-12: qty 1

## 2024-11-12 MED ORDER — RIVAROXABAN 20 MG PO TABS: 20.0000 mg | ORAL_TABLET | Freq: Every day | ORAL | Status: DC

## 2024-11-12 MED ORDER — CYANOCOBALAMIN 1000 MCG/ML IJ SOLN
1000.0000 ug | Freq: Once | INTRAMUSCULAR | Status: AC
  Administered 2024-11-13: 1000 ug via INTRAMUSCULAR
  Filled 2024-11-12: qty 1

## 2024-11-12 MED ORDER — HYDRALAZINE HCL 20 MG/ML IJ SOLN
5.0000 mg | INTRAMUSCULAR | Status: DC | PRN
  Administered 2024-11-12 – 2024-11-13 (×3): 5 mg via INTRAVENOUS
  Filled 2024-11-12 (×3): qty 1

## 2024-11-12 MED ORDER — KETOROLAC TROMETHAMINE 15 MG/ML IJ SOLN
15.0000 mg | Freq: Four times a day (QID) | INTRAMUSCULAR | Status: AC | PRN
  Administered 2024-11-13 – 2024-11-14 (×2): 15 mg via INTRAVENOUS
  Filled 2024-11-12 (×2): qty 1

## 2024-11-12 NOTE — Progress Notes (Signed)
 "        [facility]                                                                               [name], is a 79 y.o. female, DOB - March 21, 1946, FMW:[id number] Admit date - 10/31/2024    Outpatient Primary MD for the patient is Onita Rush, MD  LOS - 12  days    Brief summary   [name] is an 79 y.o. female past medical history significant for PE DVT on Xarelto , history of an IVC filter, multiple back surgeries mild aortic stenosis essential hypertension morbid obesity presents today after mechanical fall CT of the lumbar spine showed Chance fracture of old L4-L5 fusion with significant distraction.  Neurosurgery was consulted who recommended bedrest and a 30 degree angle for pain control and rehab.    Assessment & Plan    Assessment and Plan:   Acute close lumbar fracture S/p open reduction at the L4-L5. Repeat CT of the lumbar spine without contrast shows Recent hyperextension injury at L5-S1 status post interval posterior fusion. Unchanged anterior distraction of the L5-S1 disc space. Regional postoperative changes and retroperitoneal hematoma. Neurosurgery on board recommending therapy evaluations at this time Pain controlled. Waiting for her to progress with physical therapy to see hopefully she can be safely discharged.    Acute encephalopathy, unclear etiology.  Suspect hospital delirium  Some hallucinations too.  Patient able to move all her extremities.  She is alert and oriented to person only. CT head with out contrast is negative. Ordered MRI brain without contrast for further evaluation. Check vit b1 level. TSh wnl.  Ammonia levels is wnl. Will check VIT B12  level .  Redirection and frequent orientation recommended.  Added seroquel  for agitation and delirium but she is refusing to take oral pills this morning.     AKI and hyperkalemia Resolved. Creatinine back to baseline.    Acute anemia from blood loss  Secondary to retroperitoneal  hematoma. Continue to monitor hemoglobin and transfuse to keep it greater than 7 Continue to monitor.  Hemoglobin around 9.4 this am.      History of PE/DVT and an IVC filter in place Need to hold Xarelto  and any kind of anticoagulation due to retroperitoneal bleed and a drop of hemoglobin from 10-7.8. Hemoglobin stabilized at 8 to 9 today.  Discussed with the husband , about her retroperitoneal hematoma and the reason for holding anti coagulation, if her hemoglobin remains stable in am, will reach out to NS to see if we can  slowly restart anticoagulation with IV heparin  and if no change in retroperitoneal hematoma, will transition to xarelto  on discharge. Husband/ spouse agreeable to the plan.  Hematology consulted for recommendations on when we can start anti coagulation.   Urinary retention:  Patient had post voiding residual of more than 200 .  Will replace foley, obtain UA and urine cultures.  Discussed the plan with the husband.    Body mass index is 52.26 kg/m. Morbid obesity Poor prognostic factor   Hypothyroidism Continue Synthroid   Lymphedema Status post IVC filter   Hyponatremia Unclear etiology. Suspect from poor oral intake?  Improving with IV  fluids.  Urine osmo is pending.  Serum osmo is 276. TSH is wnl.  Urine sodium pending.  Check am cortisol levels.   Hypertension:  BP parameters are high.  Increased cozaar  to 100 mg daily.  Patient has prn hydralazine  IV       Estimated body mass index is 52.26 kg/m as calculated from the following:   Height as of this encounter: 5' 4 (1.626 m).   Weight as of this encounter: 138.1 kg.  Code Status: Full code DVT Prophylaxis:  Place and maintain sequential compression device Start: 11/06/24 0848 SCD's Start: 11/01/24 2359   Level of Care: Level of care: Med-Surg Family Communication: discussed the plan with the husband over the phone.   Disposition Plan:     Remains inpatient appropriate:  pending.     Procedures:  S/p lumbar fusion surgery.   Consultants:   NS   Antimicrobials:   Anti-infectives (From admission, onward)    Start     Dose/Rate Route Frequency Ordered Stop   11/09/24 2200  cephALEXin  (KEFLEX ) capsule 500 mg        500 mg Oral Every 8 hours 11/09/24 1519 11/10/24 1434   11/06/24 1400  cephALEXin  (KEFLEX ) capsule 500 mg  Status:  Discontinued        500 mg Oral Every 8 hours 11/06/24 1036 11/09/24 1519   11/02/24 0045  ceFAZolin  (ANCEF ) IVPB 2g/100 mL premix        2 g 200 mL/hr over 30 Minutes Intravenous Every 8 hours 11/01/24 2358 11/02/24 0930        Medications  Scheduled Meds:  acidophilus  1 capsule Oral q AM   ALPRAZolam   0.5 mg Oral BID   atenolol   25 mg Oral Daily   Chlorhexidine  Gluconate Cloth  6 each Topical Daily   docusate sodium   100 mg Oral BID   escitalopram   5 mg Oral QODAY   famotidine   40 mg Oral QHS   feeding supplement  237 mL Oral BID BM   gabapentin   600 mg Oral TID   levothyroxine   25 mcg Oral Q M,W,F   losartan   100 mg Oral Daily   pantoprazole   40 mg Oral Daily   senna  1 tablet Oral BID   simvastatin   20 mg Oral QHS   sodium chloride  flush  3 mL Intravenous Q12H   Continuous Infusions:  sodium chloride      sodium chloride  75 mL/hr at 11/11/24 1647   PRN Meds:.acetaminophen  **OR** acetaminophen , alum & mag hydroxide-simeth, atenolol  **AND** atenolol , bisacodyl , bisacodyl , haloperidol  lactate, hydrALAZINE , HYDROcodone -acetaminophen , HYDROmorphone  (DILAUDID ) injection, melatonin, menthol  **OR** phenol, methocarbamol  **OR** methocarbamol  (ROBAXIN ) injection, OLANZapine , ondansetron  **OR** ondansetron  (ZOFRAN ) IV, polyethylene glycol, sodium chloride  flush, sodium phosphate     Subjective:   [name] was seen and examined today.continues to be delirious, confused and oriented to person only.  Objective:   Vitals:   11/11/24 1646 11/11/24 2100 11/12/24 0500 11/12/24 0640  BP: (!) 141/78 (!) 144/82 (!) 187/103  (!) 140/100  Pulse: 70 85 85   Resp: 17 18 18    Temp: 98.2 F (36.8 C) 98.4 F (36.9 C) 98.1 F (36.7 C)   TempSrc: Oral Oral Oral   SpO2: 97% 94% 97%   Weight:      Height:        Intake/Output Summary (Last 24 hours) at 11/12/2024 0919 Last data filed at 11/12/2024 0400 Gross per 24 hour  Intake 60 ml  Output 1100 ml  Net -1040 ml  Filed Weights   10/31/24 1832 11/02/24 0144  Weight: 124.3 kg (!) 138.1 kg     Exam General exam: Ill appearing elderly lady , not in distress.  Respiratory system: Clear to auscultation. Respiratory effort normal. Cardiovascular system: S1 & S2 heard, RRR.  Gastrointestinal system: Abdomen is nondistended, soft and nontender.  Central nervous system: Alert and oriented to person only, confused, delirious.  Extremities: bilateral chronic leg edema.  Skin: No rashes,  Psychiatry: restless, confused.       Data Reviewed:  I have personally reviewed following labs and imaging studies   CBC Lab Results  Component Value Date   WBC 14.9 (H) 11/12/2024   RBC 3.32 (L) 11/12/2024   HGB 9.4 (L) 11/12/2024   HCT 28.9 (L) 11/12/2024   MCV 87.0 11/12/2024   MCH 28.3 11/12/2024   PLT 431 (H) 11/12/2024   MCHC 32.5 11/12/2024   RDW 15.8 (H) 11/12/2024   LYMPHSABS 0.9 11/12/2024   MONOABS 1.5 (H) 11/12/2024   EOSABS 0.1 11/12/2024   BASOSABS 0.0 11/12/2024     Last metabolic panel Lab Results  Component Value Date   NA 132 (L) 11/12/2024   K 4.0 11/12/2024   CL 95 (L) 11/12/2024   CO2 25 11/12/2024   BUN 13 11/12/2024   CREATININE 0.82 11/12/2024   GLUCOSE 114 (H) 11/12/2024   GFRNONAA >60 11/12/2024   GFRAA >60 08/19/2018   CALCIUM  10.1 11/12/2024   PROT 6.5 11/12/2024   ALBUMIN 3.6 11/12/2024   BILITOT 0.9 11/12/2024   ALKPHOS 77 11/12/2024   AST 30 11/12/2024   ALT 21 11/12/2024   ANIONGAP 12 11/12/2024    CBG (last 3)  No results for input(s): GLUCAP in the last 72 hours.    Coagulation Profile: No results for  input(s): INR, PROTIME in the last 168 hours.   Radiology Studies: CT HEAD WO CONTRAST ( ) Result Date: 11/11/2024 EXAM: CT HEAD WITHOUT CONTRAST 11/11/2024 01:19:18 PM TECHNIQUE: CT of the head was performed without the administration of intravenous contrast. Automated exposure control, iterative reconstruction, and/or weight based adjustment of the mA/kV was utilized to reduce the radiation dose to as low as reasonably achievable. COMPARISON: 10/31/2024 CLINICAL HISTORY: Mental status change, unknown cause. FINDINGS: BRAIN AND VENTRICLES: No acute hemorrhage. No evidence of acute infarct. No hydrocephalus. No extra-axial collection. No mass effect or midline shift. Mild chronic microvascular ischemic change. Unchanged fat density lesion along right frontal horn. ORBITS: No acute abnormality. SINUSES: Mucosal thickening in right sphenoid sinus. SOFT TISSUES AND SKULL: No acute soft tissue abnormality. No skull fracture. Atherosclerosis of the carotid siphons. IMPRESSION: 1. No acute intracranial abnormality. 2. Mild chronic microvascular ischemic change. Electronically signed by: Donnice Mania MD 11/11/2024 01:28 PM EST RP Workstation: [id number]   DG CHEST PORT 1 VIEW Result Date: 11/11/2024 CLINICAL DATA:  [id number] Fever [id number] EXAM: PORTABLE CHEST 1 VIEW COMPARISON:  October 31, 2024, October 01, 2021 FINDINGS: The cardiomediastinal silhouette is unchanged in contour.Atherosclerotic calcifications. No pleural effusion. No pneumothorax. No acute pleuroparenchymal abnormality. IMPRESSION: No acute cardiopulmonary abnormality. Electronically Signed   By: Corean Salter M.D.   On: 11/11/2024 11:05       Elgie Butter M.D. [facility] 11/12/2024, 9:19 AM  Available via Epic secure chat 7am-7pm After 7 pm, please refer to night coverage provider listed on amion.    "

## 2024-11-12 NOTE — Progress Notes (Signed)
 [name]   DOB:01-28-1946   FM#:[id number]   RDW#:[id number]  Hem/onc follow up note   Subjective: I was called by Dr. Cherlyn to evaluate the timing of restarting anticoagulation.  Patient was confused, not able to answer my questions.  I reviewed her chart.   Objective:  Vitals:   11/12/24 0959 11/12/24 1045  BP: (!) 175/66 (!) 149/68  Pulse: 86   Resp: 19   Temp: 98.1 F (36.7 C)   SpO2: 97%     Body mass index is 52.26 kg/m.  Intake/Output Summary (Last 24 hours) at 11/12/2024 1553 Last data filed at 11/12/2024 0400 Gross per 24 hour  Intake 60 ml  Output 600 ml  Net -540 ml     Sclerae unicteric, obese lady  Lungs clear -- no rales or rhonchi  Heart regular rate and rhythm  Abdomen benign  no peripheral edema    CBG (last 3)  No results for input(s): GLUCAP in the last 72 hours.   Labs:  Lab Results  Component Value Date   WBC 14.9 (H) 11/12/2024   HGB 9.4 (L) 11/12/2024   HCT 28.9 (L) 11/12/2024   MCV 87.0 11/12/2024   PLT 431 (H) 11/12/2024   NEUTROABS 12.3 (H) 11/12/2024     Urine Studies No results for input(s): UHGB, CRYS in the last 72 hours.  Invalid input(s): UACOL, UAPR, USPG, UPH, UTP, UGL, UKET, UBIL, UNIT, UROB, [place], UEPI, UWBC, [place], [place], [place], [place], [place]  Basic Metabolic Panel: Recent Labs  Lab 11/09/24 0405 11/09/24 1718 11/10/24 1155 11/11/24 1130 11/12/24 0400  NA 131* 129* 129* 130* 132*  K 4.1 4.3 4.2 4.3 4.0  CL 94* 93* 94* 94* 95*  CO2 26 27 26 24 25   GLUCOSE 91 132* 105* 118* 114*  BUN 14 15 12 10 13   CREATININE 0.77 0.78 0.71 0.79 0.82  CALCIUM  9.8 9.7 9.8 10.0 10.1   GFR Estimated Creatinine Clearance: 78.6 mL/min (by C-G formula based on SCr of 0.82 mg/dL). Liver Function Tests: Recent Labs  Lab 11/10/24 1928 11/12/24 0400  AST 24 30  ALT 22 21  ALKPHOS 73 77  BILITOT 0.8 0.9  PROT 6.2* 6.5  ALBUMIN 3.4* 3.6   No results for input(s): LIPASE, AMYLASE in  the last 168 hours. Recent Labs  Lab 11/10/24 1928  AMMONIA 19   Coagulation profile No results for input(s): INR, PROTIME in the last 168 hours.  CBC: Recent Labs  Lab 11/08/24 1035 11/09/24 0405 11/10/24 1928 11/11/24 1130 11/12/24 0400  WBC 8.7 9.3 11.8* 12.9* 14.9*  NEUTROABS 6.3 6.6 9.1* 10.5* 12.3*  HGB 8.3* 8.3* 8.9* 9.1* 9.4*  HCT 25.6* 25.2* 26.7* 27.5* 28.9*  MCV 88.0 88.1 85.9 85.1 87.0  PLT 388 423* 438* 431* 431*   Cardiac Enzymes: No results for input(s): CKTOTAL, CKMB, CKMBINDEX, TROPONINI in the last 168 hours. BNP: Invalid input(s): POCBNP CBG: No results for input(s): GLUCAP in the last 168 hours. D-Dimer No results for input(s): DDIMER in the last 72 hours. Hgb A1c No results for input(s): HGBA1C in the last 72 hours. Lipid Profile No results for input(s): CHOL, HDL, LDLCALC, TRIG, CHOLHDL, LDLDIRECT in the last 72 hours. Thyroid  function studies Recent Labs    11/10/24 1155  TSH 1.460   Anemia work up Recent Labs    11/11/24 1130  FOLATE >20.0   Microbiology Recent Results (from the past 240 hours)  Urine Culture     Status: None   Collection Time: 11/10/24  8:17 PM   Specimen: Urine, Clean Catch  Result Value Ref Range Status   Specimen Description URINE, CLEAN CATCH  Final   Special Requests NONE  Final   Culture   Final    NO GROWTH Performed at [facility], [address], [place], [place] [address]    Report Status 11/11/2024 FINAL  Final      Studies:  CT HEAD WO CONTRAST ( ) Result Date: 11/11/2024 EXAM: CT HEAD WITHOUT CONTRAST 11/11/2024 01:19:18 PM TECHNIQUE: CT of the head was performed without the administration of intravenous contrast. Automated exposure control, iterative reconstruction, and/or weight based adjustment of the mA/kV was utilized to reduce the radiation dose to as low as reasonably achievable. COMPARISON: 10/31/2024 CLINICAL HISTORY: Mental status change, unknown  cause. FINDINGS: BRAIN AND VENTRICLES: No acute hemorrhage. No evidence of acute infarct. No hydrocephalus. No extra-axial collection. No mass effect or midline shift. Mild chronic microvascular ischemic change. Unchanged fat density lesion along right frontal horn. ORBITS: No acute abnormality. SINUSES: Mucosal thickening in right sphenoid sinus. SOFT TISSUES AND SKULL: No acute soft tissue abnormality. No skull fracture. Atherosclerosis of the carotid siphons. IMPRESSION: 1. No acute intracranial abnormality. 2. Mild chronic microvascular ischemic change. Electronically signed by: Donnice Mania MD 11/11/2024 01:28 PM EST RP Workstation: [id number]   DG CHEST PORT 1 VIEW Result Date: 11/11/2024 CLINICAL DATA:  [id number] Fever [id number] EXAM: PORTABLE CHEST 1 VIEW COMPARISON:  October 31, 2024, October 01, 2021 FINDINGS: The cardiomediastinal silhouette is unchanged in contour.Atherosclerotic calcifications. No pleural effusion. No pneumothorax. No acute pleuroparenchymal abnormality. IMPRESSION: No acute cardiopulmonary abnormality. Electronically Signed   By: Corean Salter M.D.   On: 11/11/2024 11:05    Assessment: 79 y.o. female   Acute closed lumbar fracture, status post open reduction at the L4-5 History of PE and DVT, status post IVC filter previously History of retroperitoneal bleeding after her spinal surgery Urinary retention  Acute metabolic encephalopathy   Plan:  - Due to her postop retroperitoneal hematoma (noticed on CT 1/19) from anticoagulation, I will hold a/c for now until cleared by her neurosurgeon, specially she has IVC filter in place. - Due to her previous recurrent DVT and sedentary lifestyle, especially the recent surgery, long-term anticoagulation certainly is needed. - I will inform her primary otologist Dr. Lonn for f/u    Onita Mattock, MD 11/12/2024  3:53 PM

## 2024-11-12 NOTE — Progress Notes (Signed)
 Patient ID: [name], female   DOB: [birth year], 79 y.o.   MRN: [id number] Progress slow. Patient is ok for anticoagulation as needed.

## 2024-11-12 NOTE — TOC Progression Note (Addendum)
 Transition of Care [facility]) - Progression Note    Patient Details  Name: LALONNIE SHAFFER MRN: [id number] Date of Birth: 1946-09-04  Transition of Care [facility]) CM/SW Contact  Bridget Cordella Simmonds, LCSW Phone Number: 11/12/2024, 8:33 AM  Clinical Narrative:   CSW attempted to reach pt on room phone, unsuccessful.  Per RN, pt confused.  CSW spoke with pt son Jodie, who is out of town, discussed CIR vs SNF and he thinks pt will not be able to do CIR level rehab.  He will call his father, pt husband, and discuss and CSW will discuss bed offers with him.   SNF auth request with PTAR made with Connie/[facility]A, facility choice pending.  1000: CSW spoke with pt husband Jodie, discussed bed offers, he would like to accept offer at [facility].  CSW updated son Chyrl.  [facility]A updated with facility choice.  Due to uncertain DC date, request withdrawn for now.    Expected Discharge Plan: IP Rehab Facility Barriers to Discharge: Continued Medical Work up, Air Traffic Controller and Services   Discharge Planning Services: CM Consult   Living arrangements for the past 2 months: Single Family Home                                       Social Drivers of Health (SDOH) Interventions SDOH Screenings   Food Insecurity: No Food Insecurity (11/01/2024)  Housing: Low Risk (11/01/2024)  Transportation Needs: No Transportation Needs (11/01/2024)  Utilities: Not At Risk (11/01/2024)  Social Connections: Socially Integrated (11/01/2024)  Tobacco Use: Low Risk (11/01/2024)    Readmission Risk Interventions     No data to display

## 2024-11-12 NOTE — Progress Notes (Shared)
 Inpatient Rehab Admissions Coordinator:   CIR following but Pt. And family appear to be leaning towards SNF. I will continue to follow until final decision is made.   Leita Kleine, MS, CCC-SLP Rehab Admissions Coordinator  [phone] (celll) [phone] (office)

## 2024-11-12 NOTE — Progress Notes (Signed)
 Inpatient Rehab Admissions Coordinator:    Note that Pt. Has accepted bed at [facility]. CIR will sign off.   Leita Kleine, MS, CCC-SLP Rehab Admissions Coordinator  [phone] (celll) [phone] (office)

## 2024-11-12 NOTE — Progress Notes (Signed)
 PT Cancellation Note  Patient Details Name: CAOIMHE DAMRON MRN: [id number] DOB: [birth year]   Cancelled Treatment:    Reason Eval/Treat Not Completed: (P) Other (comment) (note pt pending MRI brain 1/26, will plan to attempt after imaging to ensure pt safety.) Will continue efforts per PT plan of care as schedule permits once medically cleared.    Nigel Wessman M Arslan Kier 11/12/2024, 10:20 AM

## 2024-11-13 ENCOUNTER — Inpatient Hospital Stay (HOSPITAL_COMMUNITY)

## 2024-11-13 DIAGNOSIS — D62 Acute posthemorrhagic anemia: Secondary | ICD-10-CM | POA: Diagnosis not present

## 2024-11-13 DIAGNOSIS — Z7901 Long term (current) use of anticoagulants: Secondary | ICD-10-CM | POA: Diagnosis not present

## 2024-11-13 DIAGNOSIS — I2699 Other pulmonary embolism without acute cor pulmonale: Secondary | ICD-10-CM | POA: Diagnosis not present

## 2024-11-13 DIAGNOSIS — N1831 Chronic kidney disease, stage 3a: Secondary | ICD-10-CM | POA: Diagnosis not present

## 2024-11-13 DIAGNOSIS — S32059D Unspecified fracture of fifth lumbar vertebra, subsequent encounter for fracture with routine healing: Secondary | ICD-10-CM | POA: Diagnosis not present

## 2024-11-13 LAB — CBC WITH DIFFERENTIAL/PLATELET
Abs Immature Granulocytes: 0.1 10*3/uL — ABNORMAL HIGH (ref 0.00–0.07)
Basophils Absolute: 0 10*3/uL (ref 0.0–0.1)
Basophils Relative: 0 %
Eosinophils Absolute: 0 10*3/uL (ref 0.0–0.5)
Eosinophils Relative: 0 %
HCT: 27.1 % — ABNORMAL LOW (ref 36.0–46.0)
Hemoglobin: 8.9 g/dL — ABNORMAL LOW (ref 12.0–15.0)
Immature Granulocytes: 1 %
Lymphocytes Relative: 5 %
Lymphs Abs: 0.6 10*3/uL — ABNORMAL LOW (ref 0.7–4.0)
MCH: 28.5 pg (ref 26.0–34.0)
MCHC: 32.8 g/dL (ref 30.0–36.0)
MCV: 86.9 fL (ref 80.0–100.0)
Monocytes Absolute: 1.3 10*3/uL — ABNORMAL HIGH (ref 0.1–1.0)
Monocytes Relative: 9 %
Neutro Abs: 11.7 10*3/uL — ABNORMAL HIGH (ref 1.7–7.7)
Neutrophils Relative %: 85 %
Platelets: 411 10*3/uL — ABNORMAL HIGH (ref 150–400)
RBC: 3.12 MIL/uL — ABNORMAL LOW (ref 3.87–5.11)
RDW: 16.3 % — ABNORMAL HIGH (ref 11.5–15.5)
WBC: 13.8 10*3/uL — ABNORMAL HIGH (ref 4.0–10.5)
nRBC: 0 % (ref 0.0–0.2)

## 2024-11-13 MED ORDER — CYANOCOBALAMIN 1000 MCG/ML IJ SOLN
1000.0000 ug | INTRAMUSCULAR | Status: DC
  Administered 2024-11-19: 1000 ug via INTRAMUSCULAR
  Filled 2024-11-13: qty 1

## 2024-11-13 NOTE — TOC Progression Note (Signed)
 Transition of Care [facility]) - Progression Note    Patient Details  Name: [name] MRN: [id number] Date of Birth: 1946-05-17  Transition of Care [facility]) CM/SW Contact  Bridget Cordella Simmonds, LCSW Phone Number: 11/13/2024, 10:27 AM  Clinical Narrative:   SNF shara and PTAR requested of [facility]TA: left message with Tammy.    Expected Discharge Plan: IP Rehab Facility Barriers to Discharge: Continued Medical Work up, Air Traffic Controller and Services   Discharge Planning Services: CM Consult   Living arrangements for the past 2 months: Single Family Home                                       Social Drivers of Health (SDOH) Interventions SDOH Screenings   Food Insecurity: No Food Insecurity (11/01/2024)  Housing: Low Risk (11/01/2024)  Transportation Needs: No Transportation Needs (11/01/2024)  Utilities: Not At Risk (11/01/2024)  Social Connections: Socially Integrated (11/01/2024)  Tobacco Use: Low Risk (11/01/2024)    Readmission Risk Interventions     No data to display

## 2024-11-13 NOTE — Progress Notes (Signed)
 Physical Therapy Treatment Patient Details Name: [name] MRN: [id number] DOB: 1946-04-17 Today's Date: 11/13/2024   History of Present Illness Pt is a 79 y.o. F presenting to [facility] ED following a fall at home. IMG shows L4-L5 fx with significant distraction. Pt is s/p L4-L5 ORIF. Pt recently admitted 10/01/21 for a fall sustaining T7-T8 fx dislocation and is s/p posterior fixation of T6-T9. MRI negative for acute abnormality PMH is significant for T11 kyphoplasty, DVT, and PE.    PT Comments  Pt is making slow progress towards goals due to severity of surgery and lethargy/pain. Pt was able to perform bed mobility at 2 person Max A today and transfer to recliner from EOB with use of STEDY. Pt was 2 person Max A for standing with STEDY. Family present and supportive. Due to pt current functional status, home set up and available assistance at home recommending skilled physical therapy services < 3 hours/day in order to address strength, balance and functional mobility to decrease risk for falls, injury, immobility, skin break down and re-hospitalization.      If plan is discharge home, recommend the following: Two people to help with walking and/or transfers;Two people to help with bathing/dressing/bathroom;Assistance with cooking/housework;Assist for transportation;Help with stairs or ramp for entrance   Can travel by private vehicle     No  Equipment Recommendations  Hoyer lift;Wheelchair cushion (measurements PT);Wheelchair (measurements PT);BSC/3in1       Precautions / Restrictions Precautions Precautions: Back;Fall Precaution Booklet Issued: Yes (comment) Recall of Precautions/Restrictions: Impaired Precaution/Restrictions Comments: cues for log roll technique Restrictions Weight Bearing Restrictions Per Provider Order: No     Mobility  Bed Mobility Overal bed mobility: Needs Assistance Bed Mobility: Rolling, Sidelying to Sit Rolling: +2 for physical assistance, Max  assist Sidelying to sit: +2 for physical assistance, Max assist       General bed mobility comments: cues for log roll technique, Max A for trunk to mid line and LE off EOB>    Transfers Overall transfer level: Needs assistance Equipment used: Ambulation equipment used Transfers: Bed to chair/wheelchair/BSC, Sit to/from Stand Sit to Stand: +2 physical assistance, From elevated surface, Max assist           General transfer comment: 2x sit to stand to fully standing, 1x attempt from stedy seat with very little hip clearance despite Max +2 assist. SEcond assist after seated rest break on stedy with full standing to lift seat flaps for pt to sit in recliner. Pt requires verbal cues for sequencing and 2 person Max A with use of bed pad in order to lift hips from EOB then from STEDY seat Transfer via Lift Equipment: Stedy  Ambulation/Gait     General Gait Details: unable at this time.    Balance Overall balance assessment: Needs assistance Sitting-balance support: Bilateral upper extremity supported, Feet supported Sitting balance-Leahy Scale: Poor Sitting balance - Comments: Initially pt was Mod to Max A sitting EOB with L lateral lean that could have been exacerbated by bed. Pt was able to improve mildly while sitting EOB and then was able to improve significantly to CGA/Supervision while sitting on STEDY Postural control: Posterior lean Standing balance support: Bilateral upper extremity supported, During functional activity, Reliant on assistive device for balance Standing balance-Leahy Scale: Zero Standing balance comment: reliant on external support 2 person Max A      Communication Communication Communication: Impaired Factors Affecting Communication: Reduced clarity of speech  Cognition Arousal: Alert Behavior During Therapy: Flat affect   PT -  Cognitive impairments: Orientation   Orientation impairments: Place, Time                   PT - Cognition  Comments: Pt knows who she is and who family members are, states she is in a hospital, not sure why she is in a hospital Following commands: Impaired Following commands impaired: Follows one step commands with increased time, Follows one step commands inconsistently    Cueing Cueing Techniques: Verbal cues, Visual cues, Tactile cues     General Comments General comments (skin integrity, edema, etc.): Spouse and son present and encouraging during session. No signs/symptoms of cardiac/respiratory distress during session.      Pertinent Vitals/Pain Pain Assessment Pain Assessment: Faces Faces Pain Scale: Hurts whole lot Pain Location: R hip Pain Descriptors / Indicators: Grimacing, Guarding Pain Intervention(s): Limited activity within patient's tolerance, Monitored during session, Repositioned     PT Goals (current goals can now be found in the care plan section) Acute Rehab PT Goals Patient Stated Goal: to get stronger PT Goal Formulation: With patient/family Time For Goal Achievement: 11/21/24 Potential to Achieve Goals: Good Progress towards PT goals: Progressing toward goals    Frequency    Min 3X/week      PT Plan  Continue with current POC     Co-evaluation PT/OT/SLP Co-Evaluation/Treatment: Yes Reason for Co-Treatment: For patient/therapist safety;To address functional/ADL transfers PT goals addressed during session: Mobility/safety with mobility;Balance OT goals addressed during session: ADL's and self-care;Strengthening/ROM      AM-PAC PT 6 Clicks Mobility   Outcome Measure  Help needed turning from your back to your side while in a flat bed without using bedrails?: Total Help needed moving from lying on your back to sitting on the side of a flat bed without using bedrails?: Total Help needed moving to and from a bed to a chair (including a wheelchair)?: Total Help needed standing up from a chair using your arms (e.g., wheelchair or bedside chair)?:  Total Help needed to walk in hospital room?: Total Help needed climbing 3-5 steps with a railing? : Total 6 Click Score: 6    End of Session   Activity Tolerance: Patient tolerated treatment well;Patient limited by lethargy;Patient limited by pain Patient left: with call bell/phone within reach;in chair;with family/visitor present Nurse Communication: Mobility status;Need for lift equipment PT Visit Diagnosis: Other abnormalities of gait and mobility (R26.89);Muscle weakness (generalized) (M62.81);Pain Pain - Right/Left: Right Pain - part of body: Hip     Time: 1330-1408 PT Time Calculation (min) (ACUTE ONLY): 38 min  Charges:    $Therapeutic Activity: 8-22 mins PT General Charges $$ ACUTE PT VISIT: 1 Visit                    Dorothyann Maier, DPT, CLT  Acute Rehabilitation Services Office: [phone] (Secure chat preferred)    Dorothyann VEAR Maier 11/13/2024, 2:14 PM

## 2024-11-13 NOTE — Plan of Care (Signed)
 Patient is sleepy with relatives at bedside   Problem: Clinical Measurements: Goal: Will remain free from infection Outcome: Progressing   Problem: Activity: Goal: Risk for activity intolerance will decrease Outcome: Progressing   Problem: Nutrition: Goal: Adequate nutrition will be maintained Outcome: Progressing   Problem: Pain Managment: Goal: General experience of comfort will improve and/or be controlled Outcome: Progressing   Problem: Safety: Goal: Ability to remain free from injury will improve Outcome: Progressing   Problem: Skin Integrity: Goal: Risk for impaired skin integrity will decrease Outcome: Progressing   Problem: Skin Integrity: Goal: Demonstrates signs of wound healing without infection Outcome: Progressing

## 2024-11-13 NOTE — Progress Notes (Signed)
 "        [facility]                                                                               [name], is a 79 y.o. female, DOB - 1946/08/29, FMW:[id number] Admit date - 10/31/2024    Outpatient Primary MD for the patient is Onita Rush, MD  LOS - 13  days    Brief summary   [name] is an 79 y.o. female past medical history significant for PE DVT on Xarelto , history of an IVC filter, multiple back surgeries mild aortic stenosis, essential hypertension, morbid obesity presents today after mechanical fall CT of the lumbar spine showed Chance fracture of old L4-L5 fusion with significant distraction.  Neurosurgery was consulted who recommended bedrest and a 30 degree angle for pain control and rehab.    Assessment & Plan    Assessment and Plan:   Acute close lumbar fracture S/p open reduction at the L4-L5. Repeat CT of the lumbar spine without contrast shows Recent hyperextension injury at L5-S1 status post interval posterior fusion. Unchanged anterior distraction of the L5-S1 disc space. Regional postoperative changes and retroperitoneal hematoma. Neurosurgery on board recommending therapy evaluations at this time Pain controlled. Waiting for her to progress with physical therapy to see hopefully she can be safely discharged.    Acute encephalopathy, unclear etiology.  Suspect hospital delirium vs from IV dilaudid   Some hallucinations too. She has some involuntary jerks, myoclonic jerks ? From gabapentin , she is on high dose of gabapentin  600 mg TID, will hold off Gabapentin  to see if her mental status improves. She is alert and oriented to person only and place, still very confused. Not agitated as she was the last 2 days.  CT head with out contrast is negative. MRI brain does not show acute intracranial abnormalities.  Vita b1 levels,.  TSh wnl.  Ammonia levels is wnl. Vit b12 level is low normal. Supplementation ordered.  Redirection and frequent  orientation recommended.  Family at bedside, who were updated.    Low grade temp and worsening leukocytosis.  So far work up has been negative.  CXR is negative for infection.  UA is negative for infection.  MRI brain unremarkable.  CT lumbar spine with and without contrast ordered for further evaluation.  As per Dr Colon the incision site looks clean.   AKI and hyperkalemia Resolved. Creatinine back to baseline.    Acute anemia from blood loss  Secondary to retroperitoneal hematoma. Continue to monitor hemoglobin and transfuse to keep it greater than 7 Continue to monitor.  Hemoglobin has been stable between 8 to 9 .  Restarted her on xarelto  on 11/13/2023. Monitor for blood loss.      History of PE/DVT and an IVC filter in place Initially held xarelto  for the surgery and post op paravertebral hematoma and retroperitoneal hematoma  Now her hemoglobin is stable, and NS cleared her to resume anti coagulation, she was restarted on Xarelto  on 1/26. Hematology also consulted and on board.   Urinary retention:  Patient had post voiding residual of more than 200 .  No signs of infection so far.    Body mass  index is 52.26 kg/m. Morbid obesity Poor prognostic factor   Hypothyroidism Continue Synthroid   Lymphedema Status post IVC filter   Hyponatremia Unclear etiology. Suspect from poor oral intake?  Improving with IV fluids.  Urine osmo is pending.  Serum osmo is 276. TSH is wnl.  Urine sodium pending.   Hypertension:  BP parameters are high. Increased cozaar  to 100 mg daily.  Patient has prn hydralazine  IV     Body mass index is 52.26 kg/m. Morbid obesity.  Poor prognostic factor.    Chronic swelling in the back of the right ear:  Firm, non tender, not associated with any drainage. She denies any pain.  Recommend outpatient follow up with dermatology.   Estimated body mass index is 52.26 kg/m as calculated from the following:   Height as of this  encounter: 5' 4 (1.626 m).   Weight as of this encounter: 138.1 kg.  Code Status: Full code DVT Prophylaxis:  Place and maintain sequential compression device Start: 11/06/24 0848 SCD's Start: 11/01/24 2359 rivaroxaban  (XARELTO ) tablet 20 mg   Level of Care: Level of care: Med-Surg Family Communication: discussed with family at bedside and on the phone.   Disposition Plan:     Remains inpatient appropriate:  pending her participation in therapy. .    Procedures:  S/p lumbar fusion surgery.   Consultants:   NS  Hematology oncology.   Antimicrobials:   Anti-infectives (From admission, onward)    Start     Dose/Rate Route Frequency Ordered Stop   11/09/24 2200  cephALEXin  (KEFLEX ) capsule 500 mg        500 mg Oral Every 8 hours 11/09/24 1519 11/10/24 1434   11/06/24 1400  cephALEXin  (KEFLEX ) capsule 500 mg  Status:  Discontinued        500 mg Oral Every 8 hours 11/06/24 1036 11/09/24 1519   11/02/24 0045  ceFAZolin  (ANCEF ) IVPB 2g/100 mL premix        2 g 200 mL/hr over 30 Minutes Intravenous Every 8 hours 11/01/24 2358 11/02/24 0930        Medications  Scheduled Meds:  acidophilus  1 capsule Oral q AM   ALPRAZolam   0.5 mg Oral BID   atenolol   25 mg Oral Daily   Chlorhexidine  Gluconate Cloth  6 each Topical Daily   [START ON 11/20/2024] cyanocobalamin   1,000 mcg Intramuscular Q7 days   docusate sodium   100 mg Oral BID   escitalopram   5 mg Oral QODAY   famotidine   40 mg Oral QHS   feeding supplement  237 mL Oral BID BM   levothyroxine   25 mcg Oral Q M,W,F   losartan   100 mg Oral Daily   pantoprazole   40 mg Oral Daily   QUEtiapine   25 mg Oral BID   rivaroxaban   20 mg Oral Q supper   senna  1 tablet Oral BID   simvastatin   20 mg Oral QHS   sodium chloride  flush  3 mL Intravenous Q12H   Continuous Infusions:  sodium chloride      sodium chloride  75 mL/hr at 11/13/24 0300   PRN Meds:.acetaminophen  **OR** acetaminophen , alum & mag hydroxide-simeth, atenolol  **AND**  atenolol , bisacodyl , bisacodyl , haloperidol  lactate, hydrALAZINE , HYDROcodone -acetaminophen , ketorolac , melatonin, menthol  **OR** phenol, methocarbamol  **OR** methocarbamol  (ROBAXIN ) injection, ondansetron  **OR** ondansetron  (ZOFRAN ) IV, polyethylene glycol, sodium chloride  flush, sodium phosphate     Subjective:   Teddy Pena was seen and examined today. Still confused and delirious, . Not in pain.    Objective:   Vitals:  11/12/24 1757 11/12/24 2100 11/13/24 0500 11/13/24 0827  BP: (!) 181/79 127/65 (!) 177/79 (!) 172/73  Pulse: 89 96 100 89  Resp: 19 18  18   Temp: 98 F (36.7 C) 100 F (37.8 C) 98 F (36.7 C) 100 F (37.8 C)  TempSrc: Axillary Axillary Oral   SpO2: 98% 98% 96% 96%  Weight:      Height:        Intake/Output Summary (Last 24 hours) at 11/13/2024 1114 Last data filed at 11/13/2024 0300 Gross per 24 hour  Intake 1751.26 ml  Output --  Net 1751.26 ml   Filed Weights   10/31/24 1832 11/02/24 0144  Weight: 124.3 kg (!) 138.1 kg     Exam General exam: Ill appearing elderly female, confused.  Respiratory system: Clear to auscultation. Respiratory effort normal. Cardiovascular system: S1 & S2 heard, RRR.  Gastrointestinal system: Abdomen is soft, bs+ Central nervous system: Alert  but confused, able to move her extremities.  Extremities: chronic lymphedema Skin: No rashes,  Psychiatry: unable to assess due to confusion.        Data Reviewed:  I have personally reviewed following labs and imaging studies   CBC Lab Results  Component Value Date   WBC 13.8 (H) 11/13/2024   RBC 3.12 (L) 11/13/2024   HGB 8.9 (L) 11/13/2024   HCT 27.1 (L) 11/13/2024   MCV 86.9 11/13/2024   MCH 28.5 11/13/2024   PLT 411 (H) 11/13/2024   MCHC 32.8 11/13/2024   RDW 16.3 (H) 11/13/2024   LYMPHSABS 0.6 (L) 11/13/2024   MONOABS 1.3 (H) 11/13/2024   EOSABS 0.0 11/13/2024   BASOSABS 0.0 11/13/2024     Last metabolic panel Lab Results  Component Value Date    NA 132 (L) 11/12/2024   K 4.0 11/12/2024   CL 95 (L) 11/12/2024   CO2 25 11/12/2024   BUN 13 11/12/2024   CREATININE 0.82 11/12/2024   GLUCOSE 114 (H) 11/12/2024   GFRNONAA >60 11/12/2024   GFRAA >60 08/19/2018   CALCIUM  10.1 11/12/2024   PROT 6.5 11/12/2024   ALBUMIN 3.6 11/12/2024   BILITOT 0.9 11/12/2024   ALKPHOS 77 11/12/2024   AST 30 11/12/2024   ALT 21 11/12/2024   ANIONGAP 12 11/12/2024    CBG (last 3)  No results for input(s): GLUCAP in the last 72 hours.    Coagulation Profile: No results for input(s): INR, PROTIME in the last 168 hours.   Radiology Studies: MR BRAIN WO CONTRAST Result Date: 11/12/2024 CLINICAL DATA:  Initial evaluation for mental status change, unknown cause. EXAM: MRI HEAD WITHOUT CONTRAST TECHNIQUE: Multiplanar, multiecho pulse sequences of the brain and surrounding structures were obtained without intravenous contrast. COMPARISON:  Comparison made with head CT from 11/11/2024. FINDINGS: Brain: Examination technically limited as the patient was unable to tolerate the full length of the exam. Additionally, provided images are moderately to severely degraded by motion artifact. Cerebral volume within normal limits. Mild patchy T2/FLAIR signal abnormality seen involving the periventricular white matter, most characteristic of chronic microvascular ischemic disease, mild for age. No visible foci of restricted diffusion to suggest acute or subacute ischemia. Gray-white matter differentiation maintained. No acute intracranial hemorrhage. Focus of susceptibility artifacts seen involving the parasagittal posterior left frontal region (series 12, image 44), suggesting chronic blood products at this location. No other chronic intracranial hemorrhage seen on this motion degraded exam. No mass lesion, midline shift or mass effect. Ventricles normal size without hydrocephalus. No extra-axial fluid collection. Pituitary gland and suprasellar  region within normal  limits. Vascular: Major intracranial vascular flow voids are grossly maintained on this motion degraded exam. Skull and upper cervical spine: Craniocervical junction within normal limits. Bone marrow signal intensity grossly normal. Reversal of the normal cervical lordosis with postoperative changes noted within the visualized upper cervical spine. No visible scalp soft tissue abnormality. Sinuses/Orbits: Prior ocular lens replacement on the left. No visible abnormality about the globes orbital soft tissues. Scattered mucosal thickening about the sphenoid ethmoidal sinuses. Paranasal sinuses are otherwise largely clear. Small left mastoid effusion noted, of doubtful significance. Other: None. IMPRESSION: 1. Technically limited truncated and motion degraded exam. 2. No acute intracranial abnormality. 3. Mild chronic microvascular ischemic disease for age. Electronically Signed   By: Morene Hoard M.D.   On: 11/12/2024 20:14   CT HEAD WO CONTRAST ( ) Result Date: 11/11/2024 EXAM: CT HEAD WITHOUT CONTRAST 11/11/2024 01:19:18 PM TECHNIQUE: CT of the head was performed without the administration of intravenous contrast. Automated exposure control, iterative reconstruction, and/or weight based adjustment of the mA/kV was utilized to reduce the radiation dose to as low as reasonably achievable. COMPARISON: 10/31/2024 CLINICAL HISTORY: Mental status change, unknown cause. FINDINGS: BRAIN AND VENTRICLES: No acute hemorrhage. No evidence of acute infarct. No hydrocephalus. No extra-axial collection. No mass effect or midline shift. Mild chronic microvascular ischemic change. Unchanged fat density lesion along right frontal horn. ORBITS: No acute abnormality. SINUSES: Mucosal thickening in right sphenoid sinus. SOFT TISSUES AND SKULL: No acute soft tissue abnormality. No skull fracture. Atherosclerosis of the carotid siphons. IMPRESSION: 1. No acute intracranial abnormality. 2. Mild chronic microvascular ischemic  change. Electronically signed by: Donnice Mania MD 11/11/2024 01:28 PM EST RP Workstation: [id number]       Elgie Butter M.D. [facility] 11/13/2024, 11:14 AM  Available via Epic secure chat 7am-7pm After 7 pm, please refer to night coverage provider listed on amion.    "

## 2024-11-13 NOTE — Progress Notes (Signed)
 "        [facility]                                                                               Ema Hebner, is a 79 y.o. female, DOB - [birth year], FMW:[id number] Admit date - 10/31/2024    Outpatient Primary MD for the patient is Onita Rush, MD  LOS - 13  days    Brief summary   [name] is an 79 y.o. female past medical history significant for PE DVT on Xarelto , history of an IVC filter, multiple back surgeries mild aortic stenosis, essential hypertension, morbid obesity presents today after mechanical fall CT of the lumbar spine showed Chance fracture of old L4-L5 fusion with significant distraction.  Neurosurgery was consulted who recommended bedrest and a 30 degree angle for pain control and rehab.    Assessment & Plan    Assessment and Plan:   Acute close lumbar fracture S/p open reduction at the L4-L5. Repeat CT of the lumbar spine without contrast shows Recent hyperextension injury at L5-S1 status post interval posterior fusion. Unchanged anterior distraction of the L5-S1 disc space. Regional postoperative changes and retroperitoneal hematoma. Neurosurgery on board recommending therapy evaluations at this time Pain controlled. Waiting for her to progress with physical therapy to see hopefully she can be safely discharged.    Acute encephalopathy, unclear etiology.  Suspect hospital delirium vs from IV dilaudid   Some hallucinations too. She has some involuntary jerks, myoclonic jerks ? From gabapentin , she is on high dose of gabapentin  600 mg TID, will hold off Gabapentin  to see if her mental status improves. She is alert and oriented to person only and place, still very confused. Not agitated as she was the last 2 days.  CT head with out contrast is negative. MRI brain does not show acute intracranial abnormalities.  Vita b1 levels,.  TSh wnl.  Ammonia levels is wnl. Vit b12 level is low normal. Supplementation ordered.  Redirection and frequent  orientation recommended.  Family at bedside, who were updated.    Low grade temp and worsening leukocytosis.  So far work up has been negative.  CXR is negative for infection.  UA is negative for infection.  MRI brain unremarkable.  CT lumbar spine with and without contrast ordered for further evaluation.  As per Dr Colon the incision site looks clean.   AKI and hyperkalemia Resolved. Creatinine back to baseline.    Acute anemia from blood loss  Secondary to retroperitoneal hematoma. Continue to monitor hemoglobin and transfuse to keep it greater than 7 Continue to monitor.  Hemoglobin has been stable between 8 to 9 .  Restarted her on xarelto  on 11/13/2023. Monitor for blood loss.      History of PE/DVT and an IVC filter in place Initially held xarelto  for the surgery and post op paravertebral hematoma and retroperitoneal hematoma  Now her hemoglobin is stable, and NS cleared her to resume anti coagulation, she was restarted on Xarelto  on 1/26. Dr Lonn recommended to hold off on anti coagulation therapy for atleast 30 days, hence xarelto  was stopped. Family aware.   Urinary retention:  Patient had post voiding residual of more than  200 . Foley catheter was placed.  No signs of infection so far.    Body mass index is 52.26 kg/m. Morbid obesity Poor prognostic factor   Hypothyroidism Continue Synthroid   Lymphedema Status post IVC filter   Hyponatremia Unclear etiology. Suspect from poor oral intake?  Improving with IV fluids. Sodium of 132.  Urine osmo is pending.  Serum osmo is 276. TSH is wnl.  Urine sodium pending.   Hypertension:  BP parameters are high. Increased cozaar  to 100 mg daily.  Patient has prn hydralazine  IV     Body mass index is 52.26 kg/m. Morbid obesity.  Poor prognostic factor.    Chronic swelling in the back of the right ear:  Firm, non tender, not associated with any drainage. She denies any pain.  Recommend outpatient  follow up with dermatology.   Estimated body mass index is 52.26 kg/m as calculated from the following:   Height as of this encounter: 5' 4 (1.626 m).   Weight as of this encounter: 138.1 kg.  Code Status: Full code DVT Prophylaxis:  Place and maintain sequential compression device Start: 11/06/24 0848 SCD's Start: 11/01/24 2359   Level of Care: Level of care: Med-Surg Family Communication: discussed with family at bedside and on the phone.   Disposition Plan:     Remains inpatient appropriate:  pending her participation in therapy. .    Procedures:  S/p lumbar fusion surgery.   Consultants:   NS  Hematology oncology.   Antimicrobials:   Anti-infectives (From admission, onward)    Start     Dose/Rate Route Frequency Ordered Stop   11/09/24 2200  cephALEXin  (KEFLEX ) capsule 500 mg        500 mg Oral Every 8 hours 11/09/24 1519 11/10/24 1434   11/06/24 1400  cephALEXin  (KEFLEX ) capsule 500 mg  Status:  Discontinued        500 mg Oral Every 8 hours 11/06/24 1036 11/09/24 1519   11/02/24 0045  ceFAZolin  (ANCEF ) IVPB 2g/100 mL premix        2 g 200 mL/hr over 30 Minutes Intravenous Every 8 hours 11/01/24 2358 11/02/24 0930        Medications  Scheduled Meds:  acidophilus  1 capsule Oral q AM   ALPRAZolam   0.5 mg Oral BID   atenolol   25 mg Oral Daily   Chlorhexidine  Gluconate Cloth  6 each Topical Daily   [START ON 11/20/2024] cyanocobalamin   1,000 mcg Intramuscular Q7 days   docusate sodium   100 mg Oral BID   escitalopram   5 mg Oral QODAY   famotidine   40 mg Oral QHS   feeding supplement  237 mL Oral BID BM   levothyroxine   25 mcg Oral Q M,W,F   losartan   100 mg Oral Daily   pantoprazole   40 mg Oral Daily   senna  1 tablet Oral BID   simvastatin   20 mg Oral QHS   sodium chloride  flush  3 mL Intravenous Q12H   Continuous Infusions:  sodium chloride      sodium chloride  Stopped (11/13/24 1004)   PRN Meds:.acetaminophen  **OR** acetaminophen , alum & mag  hydroxide-simeth, atenolol  **AND** atenolol , bisacodyl , bisacodyl , haloperidol  lactate, hydrALAZINE , HYDROcodone -acetaminophen , ketorolac , melatonin, menthol  **OR** phenol, methocarbamol  **OR** methocarbamol  (ROBAXIN ) injection, ondansetron  **OR** ondansetron  (ZOFRAN ) IV, polyethylene glycol, sodium chloride  flush, sodium phosphate     Subjective:   [name] was seen and examined today. Still confused and delirious, . Not in pain.    Objective:   Vitals:   11/12/24 2100 11/13/24  0500 11/13/24 0827 11/13/24 1524  BP: 127/65 (!) 177/79 (!) 172/73 (!) 151/58  Pulse: 96 100 89 69  Resp: 18  18 18   Temp: 100 F (37.8 C) 98 F (36.7 C) 100 F (37.8 C) 98.5 F (36.9 C)  TempSrc: Axillary Oral  Oral  SpO2: 98% 96% 96% 100%  Weight:      Height:        Intake/Output Summary (Last 24 hours) at 11/13/2024 1655 Last data filed at 11/13/2024 1137 Gross per 24 hour  Intake 2241.78 ml  Output --  Net 2241.78 ml   Filed Weights   10/31/24 1832 11/02/24 0144  Weight: 124.3 kg (!) 138.1 kg     Exam General exam: Ill appearing elderly female, confused.  Respiratory system: Clear to auscultation. Respiratory effort normal. Cardiovascular system: S1 & S2 heard, RRR.  Gastrointestinal system: Abdomen is soft, bs+ Central nervous system: Alert  but confused, able to move her extremities.  Extremities: chronic lymphedema Skin: No rashes,  Psychiatry: unable to assess due to confusion.        Data Reviewed:  I have personally reviewed following labs and imaging studies   CBC Lab Results  Component Value Date   WBC 13.8 (H) 11/13/2024   RBC 3.12 (L) 11/13/2024   HGB 8.9 (L) 11/13/2024   HCT 27.1 (L) 11/13/2024   MCV 86.9 11/13/2024   MCH 28.5 11/13/2024   PLT 411 (H) 11/13/2024   MCHC 32.8 11/13/2024   RDW 16.3 (H) 11/13/2024   LYMPHSABS 0.6 (L) 11/13/2024   MONOABS 1.3 (H) 11/13/2024   EOSABS 0.0 11/13/2024   BASOSABS 0.0 11/13/2024     Last metabolic panel Lab  Results  Component Value Date   NA 132 (L) 11/12/2024   K 4.0 11/12/2024   CL 95 (L) 11/12/2024   CO2 25 11/12/2024   BUN 13 11/12/2024   CREATININE 0.82 11/12/2024   GLUCOSE 114 (H) 11/12/2024   GFRNONAA >60 11/12/2024   GFRAA >60 08/19/2018   CALCIUM  10.1 11/12/2024   PROT 6.5 11/12/2024   ALBUMIN 3.6 11/12/2024   BILITOT 0.9 11/12/2024   ALKPHOS 77 11/12/2024   AST 30 11/12/2024   ALT 21 11/12/2024   ANIONGAP 12 11/12/2024    CBG (last 3)  No results for input(s): GLUCAP in the last 72 hours.    Coagulation Profile: No results for input(s): INR, PROTIME in the last 168 hours.   Radiology Studies: MR BRAIN WO CONTRAST Result Date: 11/12/2024 CLINICAL DATA:  Initial evaluation for mental status change, unknown cause. EXAM: MRI HEAD WITHOUT CONTRAST TECHNIQUE: Multiplanar, multiecho pulse sequences of the brain and surrounding structures were obtained without intravenous contrast. COMPARISON:  Comparison made with head CT from 11/11/2024. FINDINGS: Brain: Examination technically limited as the patient was unable to tolerate the full length of the exam. Additionally, provided images are moderately to severely degraded by motion artifact. Cerebral volume within normal limits. Mild patchy T2/FLAIR signal abnormality seen involving the periventricular white matter, most characteristic of chronic microvascular ischemic disease, mild for age. No visible foci of restricted diffusion to suggest acute or subacute ischemia. Gray-white matter differentiation maintained. No acute intracranial hemorrhage. Focus of susceptibility artifacts seen involving the parasagittal posterior left frontal region (series 12, image 44), suggesting chronic blood products at this location. No other chronic intracranial hemorrhage seen on this motion degraded exam. No mass lesion, midline shift or mass effect. Ventricles normal size without hydrocephalus. No extra-axial fluid collection. Pituitary gland and  suprasellar region within  normal limits. Vascular: Major intracranial vascular flow voids are grossly maintained on this motion degraded exam. Skull and upper cervical spine: Craniocervical junction within normal limits. Bone marrow signal intensity grossly normal. Reversal of the normal cervical lordosis with postoperative changes noted within the visualized upper cervical spine. No visible scalp soft tissue abnormality. Sinuses/Orbits: Prior ocular lens replacement on the left. No visible abnormality about the globes orbital soft tissues. Scattered mucosal thickening about the sphenoid ethmoidal sinuses. Paranasal sinuses are otherwise largely clear. Small left mastoid effusion noted, of doubtful significance. Other: None. IMPRESSION: 1. Technically limited truncated and motion degraded exam. 2. No acute intracranial abnormality. 3. Mild chronic microvascular ischemic disease for age. Electronically Signed   By: Morene Hoard M.D.   On: 11/12/2024 20:14       [name] M.D. [facility] 11/13/2024, 4:55 PM  Available via Epic secure chat 7am-7pm After 7 pm, please refer to night coverage provider listed on amion.    "

## 2024-11-13 NOTE — Progress Notes (Signed)
 [name]   DOB:09-17-1946   FM#:[id number]    ASSESSMENT & PLAN:  History of recurrent DVT/PE, IVC filter placement and on chronic anticoagulation therapy With recent fall and significant retroperitoneal hematoma, I recommend no anticoagulation therapy for minimum of 30 days Of course, that has to be balanced with the risk of recurrent blood clots in her who is now postop and is immobile I discussed the difficulties of making this decision with the husband and son and explained to them the rationale behind withholding anticoagulation therapy due to potential life-threatening bleeding and they understand I have discontinued Xarelto   Acute blood loss anemia Continue transfusion support as indicated  Leukocytosis Cause unknown, could be postop related Monitor closely  Discharge planning Unknown I gave her son my card; he will contact my office for outpatient follow-up after the patient is discharged  [name], MD 11/13/2024 4:46 PM  Subjective:  Patient was sleeping when I entered the room.  Her husband and son by the bedside I saw her in 2024 for diagnosis of iron deficiency anemia, status post IV iron infusion I also commented on the need for chronic anticoagulation therapy due to history of recurrent blood clots, with presence of IVC filter placed The patient was admitted to the hospital after fall and had surgery on November 01, 2024 CT imaging revealed retroperitoneal hematoma  Objective:  Vitals:   11/13/24 0827 11/13/24 1524  BP: (!) 172/73 (!) 151/58  Pulse: 89 69  Resp: 18 18  Temp: 100 F (37.8 C) 98.5 F (36.9 C)  SpO2: 96% 100%     Intake/Output Summary (Last 24 hours) at 11/13/2024 1646 Last data filed at 11/13/2024 1137 Gross per 24 hour  Intake 2241.78 ml  Output --  Net 2241.78 ml

## 2024-11-13 NOTE — Progress Notes (Signed)
 Patient ID: [name], female   DOB: October 28, 1945, 79 y.o.   MRN: [id number] Patient seems somewhat agitated and has been confabulating.  I discussed this with Dr. Akula and seems that she has been coming down off of her Dilaudid  which may be creating this issue.  Concerned about increasing white count.  I have examined her incision today and remove the initial dressing.  The incision was painted with Betadine and dressed with gauze it appears to be dry.  Patient has not been out of bed since this past Friday and I would encourage mobilization to the full extent possible.  I also right orders for dressing changes daily and as needed should be dressing become soiled or moistened.

## 2024-11-13 NOTE — Plan of Care (Signed)
   Problem: Education: Goal: Knowledge of General Education information will improve Description Including pain rating scale, medication(s)/side effects and non-pharmacologic comfort measures Outcome: Progressing   Problem: Clinical Measurements: Goal: Will remain free from infection Outcome: Progressing   Problem: Nutrition: Goal: Adequate nutrition will be maintained Outcome: Progressing   Problem: Coping: Goal: Level of anxiety will decrease Outcome: Progressing

## 2024-11-13 NOTE — Progress Notes (Signed)
 Occupational Therapy Treatment Patient Details Name: [name] MRN: [id number] DOB: 11-22-1945 Today's Date: 11/13/2024   History of present illness Pt is a 79 y.o. F presenting [facility] ED following a fall at home. IMG shows L4-L5 fx with significant distraction. Pt is s/p L4-L5 ORIF. Pt recently admitted 10/01/21 for a fall sustaining T7-T8 fx dislocation and is s/p posterior fixation of T6-T9. MRI negative for acute abnormality PMH is significant for T11 kyphoplasty, DVT, and PE.   OT comments  Patient with incremental progress toward patient focused goals.  Very lethargic, but able to sit EOB with varying degrees of assist, eventual sit to stand in a Stedy for transition to the recliner.  Max A of two for sit to stand.  Once in the [place], able to complete seated grooming task with CGA.  OT will continue efforts in the acute setting to address deficits.  Patient will benefit from continued inpatient follow up therapy, <3 hours/day.      If plan is discharge home, recommend the following:  Two people to help with walking and/or transfers;Two people to help with bathing/dressing/bathroom;Assistance with cooking/housework;Assist for transportation;Help with stairs or ramp for entrance   Equipment Recommendations  None recommended by OT    Recommendations for Other Services      Precautions / Restrictions Precautions Precautions: Back;Fall Precaution Booklet Issued: No Recall of Precautions/Restrictions: Impaired Precaution/Restrictions Comments: cues for log roll technique Restrictions Weight Bearing Restrictions Per Provider Order: No       Mobility Bed Mobility Overal bed mobility: Needs Assistance Bed Mobility: Rolling, Sidelying to Sit Rolling: +2 for physical assistance, Max assist Sidelying to sit: +2 for physical assistance, Max assist            Transfers Overall transfer level: Needs assistance Equipment used: Ambulation equipment used Transfers: Bed to  chair/wheelchair/BSC, Sit to/from Stand Sit to Stand: +2 physical assistance, From elevated surface, Max assist             Transfer via Lift Equipment: Stedy   Balance Overall balance assessment: Needs assistance Sitting-balance support: Bilateral upper extremity supported, Feet supported Sitting balance-Leahy Scale: Poor   Postural control: Posterior lean, Left lateral lean                                 ADL either performed or assessed with clinical judgement   ADL       Grooming: Wash/dry face;Contact guard assist;Sitting           Upper Body Dressing : Moderate assistance;Sitting;Maximal assistance   Lower Body Dressing: Total assistance;Bed level       Toileting- Clothing Manipulation and Hygiene: Total assistance;Bed level              Extremity/Trunk Assessment Upper Extremity Assessment Upper Extremity Assessment: Generalized weakness;Right hand dominant;LUE deficits/detail RUE Deficits / Details: hx of shoulder replacement RUE Coordination: decreased gross motor LUE Deficits / Details: Shoulder replacement LUE Coordination: decreased gross motor   Lower Extremity Assessment Lower Extremity Assessment: Defer to PT evaluation   Cervical / Trunk Assessment Cervical / Trunk Assessment: Kyphotic;Back Surgery    Vision   Vision Assessment?: No apparent visual deficits   Perception Perception Perception: Not tested   Praxis Praxis Praxis: Not tested   Communication Communication Communication: Impaired Factors Affecting Communication: Reduced clarity of speech   Cognition Arousal: Alert Behavior During Therapy: Flat affect Cognition: Cognition impaired  Memory impairment (select all impairments): Short-term memory, Working Biochemist, Clinical functioning impairment (select all impairments): Initiation, Sequencing, Reasoning, Problem solving                   Following commands: Impaired Following commands  impaired: Follows one step commands with increased time, Follows one step commands inconsistently      Cueing   Cueing Techniques: Verbal cues, Gestural cues, Tactile cues  Exercises      Shoulder Instructions       General Comments Spouse and son present and encouraging during session. No signs/symptoms of cardiac/respiratory distress during session.    Pertinent Vitals/ Pain       Pain Assessment Pain Assessment: Faces Faces Pain Scale: Hurts whole lot Pain Location: R hip Pain Descriptors / Indicators: Grimacing, Guarding Pain Intervention(s): Monitored during session                                                          Frequency  Min 2X/week        Progress Toward Goals  OT Goals(current goals can now be found in the care plan section)  Progress towards OT goals: Progressing toward goals  Acute Rehab OT Goals OT Goal Formulation: With patient Time For Goal Achievement: 11/22/24 Potential to Achieve Goals: Fair  Plan      Co-evaluation    PT/OT/SLP Co-Evaluation/Treatment: Yes Reason for Co-Treatment: For patient/therapist safety;To address functional/ADL transfers PT goals addressed during session: Mobility/safety with mobility;Balance OT goals addressed during session: ADL's and self-care;Strengthening/ROM      AM-PAC OT 6 Clicks Daily Activity     Outcome Measure   Help from another person eating meals?: A Little Help from another person taking care of personal grooming?: A Little Help from another person toileting, which includes using toliet, bedpan, or urinal?: Total Help from another person bathing (including washing, rinsing, drying)?: A Lot Help from another person to put on and taking off regular upper body clothing?: A Lot Help from another person to put on and taking off regular lower body clothing?: Total 6 Click Score: 12    End of Session Equipment Utilized During Treatment: Other (comment)  OT Visit  Diagnosis: Pain;Muscle weakness (generalized) (M62.81) Pain - Right/Left: Right Pain - part of body: Hip   Activity Tolerance Patient limited by fatigue;Patient limited by pain   Patient Left in chair;with call bell/phone within reach;with family/visitor present   Nurse Communication Need for lift equipment        Time: 8662-8596 OT Time Calculation (min): 26 min  Charges: OT General Charges $OT Visit: 1 Visit OT Treatments $Therapeutic Activity: 8-22 mins  11/13/2024  RP, OTR/L  Acute Rehabilitation Services  Office:  [phone]   Charlie JONETTA Halsted 11/13/2024, 2:23 PM

## 2024-11-14 ENCOUNTER — Inpatient Hospital Stay (HOSPITAL_COMMUNITY)

## 2024-11-14 DIAGNOSIS — M7989 Other specified soft tissue disorders: Secondary | ICD-10-CM | POA: Diagnosis not present

## 2024-11-14 DIAGNOSIS — S32059D Unspecified fracture of fifth lumbar vertebra, subsequent encounter for fracture with routine healing: Secondary | ICD-10-CM | POA: Diagnosis not present

## 2024-11-14 LAB — BASIC METABOLIC PANEL WITH GFR
Anion gap: 10 (ref 5–15)
BUN: 22 mg/dL (ref 8–23)
CO2: 25 mmol/L (ref 22–32)
Calcium: 9.3 mg/dL (ref 8.9–10.3)
Chloride: 103 mmol/L (ref 98–111)
Creatinine, Ser: 0.79 mg/dL (ref 0.44–1.00)
GFR, Estimated: >60 mL/min
Glucose, Bld: 107 mg/dL — ABNORMAL HIGH (ref 70–99)
Potassium: 3.4 mmol/L — ABNORMAL LOW (ref 3.5–5.1)
Sodium: 138 mmol/L (ref 135–145)

## 2024-11-14 LAB — CBC
HCT: 26.9 % — ABNORMAL LOW (ref 36.0–46.0)
Hemoglobin: 8.4 g/dL — ABNORMAL LOW (ref 12.0–15.0)
MCH: 27.9 pg (ref 26.0–34.0)
MCHC: 31.2 g/dL (ref 30.0–36.0)
MCV: 89.4 fL (ref 80.0–100.0)
Platelets: 392 10*3/uL (ref 150–400)
RBC: 3.01 MIL/uL — ABNORMAL LOW (ref 3.87–5.11)
RDW: 16.5 % — ABNORMAL HIGH (ref 11.5–15.5)
WBC: 13.2 10*3/uL — ABNORMAL HIGH (ref 4.0–10.5)
nRBC: 0 % (ref 0.0–0.2)

## 2024-11-14 LAB — VITAMIN B1: Vitamin B1 (Thiamine): 75.5 nmol/L (ref 66.5–200.0)

## 2024-11-14 LAB — PRO BRAIN NATRIURETIC PEPTIDE: Pro Brain Natriuretic Peptide: 1247 pg/mL — ABNORMAL HIGH

## 2024-11-14 LAB — MAGNESIUM: Magnesium: 1.8 mg/dL (ref 1.7–2.4)

## 2024-11-14 MED ORDER — POTASSIUM CHLORIDE CRYS ER 20 MEQ PO TBCR
40.0000 meq | EXTENDED_RELEASE_TABLET | Freq: Once | ORAL | Status: AC
  Administered 2024-11-14: 40 meq via ORAL
  Filled 2024-11-14: qty 2

## 2024-11-14 MED ORDER — NYSTATIN 100000 UNIT/ML MT SUSP
5.0000 mL | Freq: Four times a day (QID) | OROMUCOSAL | Status: DC
  Administered 2024-11-14 – 2024-11-20 (×25): 500000 [IU] via ORAL
  Filled 2024-11-14 (×25): qty 5

## 2024-11-14 MED ORDER — RIVAROXABAN 20 MG PO TABS
20.0000 mg | ORAL_TABLET | Freq: Every day | ORAL | Status: DC
  Administered 2024-11-14: 20 mg via ORAL
  Filled 2024-11-14: qty 1

## 2024-11-14 MED ORDER — FUROSEMIDE 10 MG/ML IJ SOLN
40.0000 mg | Freq: Once | INTRAMUSCULAR | Status: AC
  Administered 2024-11-14: 40 mg via INTRAVENOUS
  Filled 2024-11-14: qty 4

## 2024-11-14 MED ORDER — LIDOCAINE 5 % EX PTCH
1.0000 | MEDICATED_PATCH | CUTANEOUS | Status: DC
  Administered 2024-11-14 – 2024-11-19 (×6): 1 via TRANSDERMAL
  Filled 2024-11-14 (×6): qty 1

## 2024-11-14 NOTE — TOC Progression Note (Addendum)
 Transition of Care [facility]) - Progression Note    Patient Details  Name: [name] MRN: [id number] Date of Birth: 18-May-1946  Transition of [facility]) CM/SW Contact  Bridget Cordella Simmonds, LCSW Phone Number: 11/14/2024, 9:53 AM  Clinical Narrative:   Message from Tammy/HTA: SNF auth approved: [id number], PTAR approved: [id number].  MD informed: no DC today.    Expected Discharge Plan: IP Rehab Facility Barriers to Discharge: Continued Medical Work up, Air Traffic Controller and Services   Discharge Planning Services: CM Consult   Living arrangements for the past 2 months: Single Family Home                                       Social Drivers of Health (SDOH) Interventions SDOH Screenings   Food Insecurity: No Food Insecurity (11/01/2024)  Housing: Low Risk (11/01/2024)  Transportation Needs: No Transportation Needs (11/01/2024)  Utilities: Not At Risk (11/01/2024)  Social Connections: Socially Integrated (11/01/2024)  Tobacco Use: Low Risk (11/01/2024)    Readmission Risk Interventions     No data to display

## 2024-11-14 NOTE — Progress Notes (Signed)
 Left upper extremity venous duplex and bilateral lower extremity venous duplex has been completed. Preliminary results can be found in CV Proc through chart review.  Results were given to Dr. Ezenduka.  11/14/24 2:59 PM Cathlyn Collet RVT

## 2024-11-14 NOTE — Progress Notes (Addendum)
 "        [facility]                                                                               [name], is a 79 y.o. female, DOB - [birth year], FMW:[id number] Admit date - 10/31/2024    Outpatient Primary MD for the patient is Gail Rush, MD  LOS - 14  days   HPI [name] is an 79 y.o. 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 CT of the lumbar spine showed fracture of old L4-L5 fusion with significant distraction.  Neurosurgery was consulted who recommended bedrest and a 30 degree angle for pain control and rehab.    Assessment and Plan: Acute close lumbar fracture S/p open reduction at the L4-L5 Repeat CT of the lumbar spine without contrast shows recent hyperextension injury at L5-S1 status post interval posterior fusion. Unchanged anterior distraction of the L5-S1 disc space. Regional postoperative changes and retroperitoneal hematoma. Neurosurgery on board recommending therapy evaluations at this time. Monitor clinically due to persistent leukocytosis Pain control  Low grade temp and worsening leukocytosis So far work up has been negative.  CXR is negative for infection.  UA is negative for infection.  MRI brain unremarkable.  CT lumbar spine with and without contrast, unremarkable for any post op infection As per Dr Colon the incision site looks clean.   Acute encephalopathy, unclear etiology Improving Suspect hospital delirium vs from IV dilaudid   Some hallucinations too. She has some involuntary jerks, myoclonic jerks ? From gabapentin , she is on high dose of gabapentin  600 mg TID, held Gabapentin  on 11/13/24 pending mental status improvement  CT head with out contrast is negative. MRI brain does not show acute intracranial abnormalities.  TSh wnl.  Ammonia levels is wnl. Vit b12 level is  low normal. Supplementation ordered.  Redirection and frequent orientation recommended  LUE/BLEswelling Acute DVT in LLE Noted significant edema BNP pending Echo done on 07/2024 showed EF of 60 to 65%, no regional wall motion abnormality, left ventricular diastolic parameters were normal Doppler LUE with superficial thrombus in cephalic vein Doppler left lower extremity with acute DVT extending from the common femoral vein, into the popliteal vein Will give 1 dose of IV Lasix  and monitor (patient takes Lasix  at home for edema) Restart Xarelto  for now, plan to discuss with Dr Lonn (unable to reach her via cell and offline on secure chat)  Possible oral thrush Nystatin  swish and swallow  Acute on chronic anemia Normocytic anemia Secondary to retroperitoneal hematoma Daily CBC  History of PE/DVT and an IVC filter in place Held xarelto  for the surgery and post op paravertebral hematoma and retroperitoneal hematoma  Dr Lonn with hematology recommended to hold off on anti coagulation therapy for atleast 30 days, Family aware  Acute urinary retention Patient had post voiding residual of more than 200 . Foley catheter was placed  Hypothyroidism Continue Synthroid   Lymphedema On p.o. Lasix  at home BLE Doppler Start IV Lasix   Hyponatremia Improved s/p IV fluid, will DC as patient has significant edema  Unclear etiology. Suspect from poor oral intake?   Hypertension BP uncontrolled Increased cozaar  to 100 mg daily.  Patient has prn hydralazine  IV  Chronic swelling in the back of the right ear Firm, non tender, not associated with any drainage. She denies any pain.  Recommend outpatient follow up with dermatology.   Morbid Obesity Body mass index is 52.26 kg/m. Poor prognostic factor  Estimated body mass index is 52.26 kg/m as calculated from the following:   Height as of this encounter: 5' 4 (1.626 m).   Weight as of this encounter: 138.1 kg.  Code Status: Full  code DVT Prophylaxis:  Place and maintain sequential compression device Start: 11/06/24 0848 SCD's Start: 11/01/24 2359   Level of Care: Level of care: Med-Surg Family Communication: None at bedside  Disposition Plan:     Remains inpatient appropriate:  pending her participation in therapy. .    Procedures:  S/p lumbar fusion surgery.   Consultants:   NS  Hematology oncology.   Antimicrobials:   Anti-infectives (From admission, onward)    Start     Dose/Rate Route Frequency Ordered Stop   11/09/24 2200  cephALEXin  (KEFLEX ) capsule 500 mg        500 mg Oral Every 8 hours 11/09/24 1519 11/10/24 1434   11/06/24 1400  cephALEXin  (KEFLEX ) capsule 500 mg  Status:  Discontinued        500 mg Oral Every 8 hours 11/06/24 1036 11/09/24 1519   11/02/24 0045  ceFAZolin  (ANCEF ) IVPB 2g/100 mL premix        2 g 200 mL/hr over 30 Minutes Intravenous Every 8 hours 11/01/24 2358 11/02/24 0930        Medications  Scheduled Meds:  acidophilus  1 capsule Oral q AM   ALPRAZolam   0.5 mg Oral BID   atenolol   25 mg Oral Daily   Chlorhexidine  Gluconate Cloth  6 each Topical Daily   [START ON 11/20/2024] cyanocobalamin   1,000 mcg Intramuscular Q7 days   docusate sodium   100 mg Oral BID   escitalopram   5 mg Oral QODAY   famotidine   40 mg Oral QHS   feeding supplement  237 mL Oral BID BM   levothyroxine   25 mcg Oral Q M,W,F   losartan   100 mg Oral Daily   nystatin   5 mL Oral QID   pantoprazole   40 mg Oral Daily   senna  1 tablet Oral BID   simvastatin   20 mg Oral QHS   sodium chloride  flush  3 mL Intravenous Q12H   Continuous Infusions:   PRN Meds:.acetaminophen  **OR** acetaminophen , alum & mag hydroxide-simeth, atenolol  **AND** atenolol , bisacodyl , bisacodyl , haloperidol  lactate, hydrALAZINE , HYDROcodone -acetaminophen , melatonin, menthol  **OR** phenol, methocarbamol  **OR** methocarbamol  (ROBAXIN ) injection, ondansetron  **OR** ondansetron  (ZOFRAN ) IV, polyethylene glycol, sodium chloride   flush, sodium phosphate     Subjective:   [name] was seen and examined today.  Noted to be sleepy, reports being up all night.  Drowsy but able to answer my questions appropriately.  Still requesting for pain meds   Objective:   Vitals:   11/13/24 0827 11/13/24 1524 11/13/24 2003 11/14/24 0753  BP: (!) 172/73 (!) 151/58 (!) 156/57 (!) 157/63  Pulse: 89 69 80 85  Resp: 18 18 20 18   Temp: 100 F (37.8 C) 98.5 F (36.9 C) 98.4 F (36.9 C) 98.2 F (36.8 C)  TempSrc:  Oral  Oral  SpO2: 96% 100% 100% 95%  Weight:      Height:  Intake/Output Summary (Last 24 hours) at 11/14/2024 1329 Last data filed at 11/14/2024 0500 Gross per 24 hour  Intake 1354.48 ml  Output --  Net 1354.48 ml   Filed Weights   10/31/24 1832 11/02/24 0144  Weight: 124.3 kg (!) 138.1 kg     Exam General: NAD, drowsy/sleepy, generalized edema, chronically ill-appearing Cardiovascular: S1, S2 present Respiratory: CTAB Abdomen: Soft, nontender, nondistended, bowel sounds present Musculoskeletal: bilateral pedal edema noted, LUE edema noted Skin: Normal Psychiatry: Normal mood         Data Reviewed:  I have personally reviewed following labs and imaging studies   CBC Lab Results  Component Value Date   WBC 13.2 (H) 11/14/2024   RBC 3.01 (L) 11/14/2024   HGB 8.4 (L) 11/14/2024   HCT 26.9 (L) 11/14/2024   MCV 89.4 11/14/2024   MCH 27.9 11/14/2024   PLT 392 11/14/2024   MCHC 31.2 11/14/2024   RDW 16.5 (H) 11/14/2024   LYMPHSABS 0.6 (L) 11/13/2024   MONOABS 1.3 (H) 11/13/2024   EOSABS 0.0 11/13/2024   BASOSABS 0.0 11/13/2024     Last metabolic panel Lab Results  Component Value Date   NA 138 11/14/2024   K 3.4 (L) 11/14/2024   CL 103 11/14/2024   CO2 25 11/14/2024   BUN 22 11/14/2024   CREATININE 0.79 11/14/2024   GLUCOSE 107 (H) 11/14/2024   GFRNONAA >60 11/14/2024   GFRAA >60 08/19/2018   CALCIUM  9.3 11/14/2024   PROT 6.5 11/12/2024   ALBUMIN 3.6 11/12/2024    BILITOT 0.9 11/12/2024   ALKPHOS 77 11/12/2024   AST 30 11/12/2024   ALT 21 11/12/2024   ANIONGAP 10 11/14/2024    CBG (last 3)  No results for input(s): GLUCAP in the last 72 hours.    Coagulation Profile: No results for input(s): INR, PROTIME in the last 168 hours.   Radiology Studies: CT LUMBAR SPINE WO CONTRAST Result Date: 11/14/2024 EXAM: CT OF THE LUMBAR SPINE WITHOUT CONTRAST 11/14/2024 12:47:22 AM TECHNIQUE: CT of the lumbar spine was performed without the administration of intravenous contrast. Multiplanar reformatted images are provided for review. Automated exposure control, iterative reconstruction, and/or weight based adjustment of the mA/kV was utilized to reduce the radiation dose to as low as reasonably achievable. COMPARISON: CT of the lumbar spine dated 11/05/2024. CLINICAL HISTORY: Retroperitoneal hematoma, evaluate for infection. FINDINGS: BONES AND ALIGNMENT: Normal vertebral body heights. Status post recent bilateral posterolateral spinal fusion at L5-S1 with placement of radiopaque graft material within the distracted L5-S1 disc space. Since previous study, some of the radiopaque graft material has extruded anteriorly. Some material has also extruded posterolaterally on the left within the neural foramen. There is no apparent impingement upon the exiting nerve root. Status post bilateral posterolateral spinal fusion of T11 through L3, as seen previously. There is mild dextroscoliosis of the thoracolumbar spine as before. The posterior elements remained ankylosed extending from T10 through L5. Slight fracture is again demonstrated at the L5-S1 level. No suspicious bone lesion. DEGENERATIVE CHANGES: The posterior elements remained ankylosed extending from T10 through L5. Radiopaque graft material within the distracted L5-S1 disc space. SOFT TISSUES: The soft tissue gas noted on the previous study has resolved. There has also been interval improvement of prevertebral and  retroperitoneal soft tissue stranding. The left psoas muscle remains atrophic. There are no findings suspicious for infection. An IVC filter is present. There is moderate calcific atheromatous disease within the abdominal aorta. The patient is status post cholecystectomy. IMPRESSION: 1. Status post recent  bilateral posterolateral spinal fusion at L5-S1 with placement of radiopaque graft material within the distracted L5-S1 disc space. Since the previous study, some of the radiopaque graft material has extruded anteriorly and posterolaterally on the left within the neural foramen, without apparent impingement upon the exiting nerve root. 2. Interval resolution of prior soft tissue gas with improvement in prevertebral/retroperitoneal soft tissue stranding. No findings suspicious for infection. 3. Status post bilateral posterolateral spinal fusion of T11 through L3, as seen previously, with ankylosis of the posterior elements extending from T10 through L5. 4. Mild dextroscoliosis of the thoracolumbar spine as before. Electronically signed by: Evalene Coho MD 11/14/2024 05:02 AM EST RP Workstation: [id number]   MR BRAIN WO CONTRAST Result Date: 11/12/2024 CLINICAL DATA:  Initial evaluation for mental status change, unknown cause. EXAM: MRI HEAD WITHOUT CONTRAST TECHNIQUE: Multiplanar, multiecho pulse sequences of the brain and surrounding structures were obtained without intravenous contrast. COMPARISON:  Comparison made with head CT from 11/11/2024. FINDINGS: Brain: Examination technically limited as the patient was unable to tolerate the full length of the exam. Additionally, provided images are moderately to severely degraded by motion artifact. Cerebral volume within normal limits. Mild patchy T2/FLAIR signal abnormality seen involving the periventricular white matter, most characteristic of chronic microvascular ischemic disease, mild for age. No visible foci of restricted diffusion to suggest acute or  subacute ischemia. Gray-white matter differentiation maintained. No acute intracranial hemorrhage. Focus of susceptibility artifacts seen involving the parasagittal posterior left frontal region (series 12, image 44), suggesting chronic blood products at this location. No other chronic intracranial hemorrhage seen on this motion degraded exam. No mass lesion, midline shift or mass effect. Ventricles normal size without hydrocephalus. No extra-axial fluid collection. Pituitary gland and suprasellar region within normal limits. Vascular: Major intracranial vascular flow voids are grossly maintained on this motion degraded exam. Skull and upper cervical spine: Craniocervical junction within normal limits. Bone marrow signal intensity grossly normal. Reversal of the normal cervical lordosis with postoperative changes noted within the visualized upper cervical spine. No visible scalp soft tissue abnormality. Sinuses/Orbits: Prior ocular lens replacement on the left. No visible abnormality about the globes orbital soft tissues. Scattered mucosal thickening about the sphenoid ethmoidal sinuses. Paranasal sinuses are otherwise largely clear. Small left mastoid effusion noted, of doubtful significance. Other: None. IMPRESSION: 1. Technically limited truncated and motion degraded exam. 2. No acute intracranial abnormality. 3. Mild chronic microvascular ischemic disease for age. Electronically Signed   By: Morene Hoard M.D.   On: 11/12/2024 20:14       Lebron JINNY Cage M.D. [facility] 11/14/2024, 1:29 PM  Available via Epic secure chat 7am-7pm After 7 pm, please refer to night coverage provider listed on amion.    "

## 2024-11-14 NOTE — Plan of Care (Signed)
" °  Problem: Clinical Measurements: Goal: Respiratory complications will improve Outcome: Progressing Goal: Cardiovascular complication will be avoided Outcome: Progressing   Problem: Nutrition: Goal: Adequate nutrition will be maintained Outcome: Progressing   Problem: Coping: Goal: Level of anxiety will decrease Outcome: Progressing   Problem: Nutritional: Goal: Will attain and maintain optimal nutritional status Outcome: Progressing   "

## 2024-11-14 NOTE — Progress Notes (Signed)
 Mobility Specialist Progress Note:   11/14/24 1405  Mobility  Activity Pivoted/transferred from bed to chair  Level of Assistance Maximum assist, patient does 25-49% (+2)  Assistive Device Stedy  Activity Response Tolerated well  Mobility Referral Yes  Mobility visit 1 Mobility  Mobility Specialist Start Time (ACUTE ONLY) 1340  Mobility Specialist Stop Time (ACUTE ONLY) 1354  Mobility Specialist Time Calculation (min) (ACUTE ONLY) 14 min   Received pt in bed and eager for mobility. Pt required MaxA+2 w/ Stedy. Pt c/o back pain, otherwise tolerated well. Left pt in chair with personal belongings and call light within reach. All needs met. RN aware.  Lavanda Pollack Mobility Specialist  Please contact via [employer] or  Rehab Office [phone]

## 2024-11-14 NOTE — Progress Notes (Signed)
 Mobility Specialist Progress Note;    11/14/24 1535  Mobility  Activity Pivoted/transferred from chair to bed  Level of Assistance Maximum assist, patient does 25-49% (+2)  Assistive Device Stedy  Activity Response Tolerated well  Mobility Referral Yes  Mobility visit 1 Mobility  Mobility Specialist Start Time (ACUTE ONLY) 1535  Mobility Specialist Stop Time (ACUTE ONLY) 1557  Mobility Specialist Time Calculation (min) (ACUTE ONLY) 22 min   Pt received in chair, pt requested to get back to bed. MaxA +2 for STS with use of stedy, TotalA +2 for bed mobility. Pt c/o back pain throughout, however tolerated well. Pt returned to bed with call bell and personal belongings in reach. All needs met. RN in room.   Florine Oak Mobility Specialist Please Neurosurgeon or [employer] [phone]

## 2024-11-14 NOTE — Plan of Care (Signed)
  Problem: Clinical Measurements: Goal: Will remain free from infection Outcome: Progressing Goal: Cardiovascular complication will be avoided Outcome: Progressing   Problem: Nutrition: Goal: Adequate nutrition will be maintained Outcome: Progressing   Problem: Elimination: Goal: Will not experience complications related to bowel motility Outcome: Progressing

## 2024-11-14 NOTE — Progress Notes (Signed)
 Patient ID: [name], female   DOB: 1945/11/26, 79 y.o.   MRN: [id number] CT scan reviewed.  Postoperative findings not suggestive of infection.  White count remains stable at 13.2.  Continue with dressing changes and observation clinically.  Continue to mobilize.

## 2024-11-15 DIAGNOSIS — D62 Acute posthemorrhagic anemia: Secondary | ICD-10-CM | POA: Diagnosis not present

## 2024-11-15 DIAGNOSIS — I2699 Other pulmonary embolism without acute cor pulmonale: Secondary | ICD-10-CM | POA: Diagnosis not present

## 2024-11-15 DIAGNOSIS — S32059D Unspecified fracture of fifth lumbar vertebra, subsequent encounter for fracture with routine healing: Secondary | ICD-10-CM | POA: Diagnosis not present

## 2024-11-15 LAB — BASIC METABOLIC PANEL WITH GFR
Anion gap: 9 (ref 5–15)
BUN: 22 mg/dL (ref 8–23)
CO2: 26 mmol/L (ref 22–32)
Calcium: 9.7 mg/dL (ref 8.9–10.3)
Chloride: 100 mmol/L (ref 98–111)
Creatinine, Ser: 0.86 mg/dL (ref 0.44–1.00)
GFR, Estimated: >60 mL/min
Glucose, Bld: 111 mg/dL — ABNORMAL HIGH (ref 70–99)
Potassium: 3.3 mmol/L — ABNORMAL LOW (ref 3.5–5.1)
Sodium: 135 mmol/L (ref 135–145)

## 2024-11-15 LAB — CBC WITH DIFFERENTIAL/PLATELET
Abs Immature Granulocytes: 0.05 10*3/uL (ref 0.00–0.07)
Basophils Absolute: 0.1 10*3/uL (ref 0.0–0.1)
Basophils Relative: 1 %
Eosinophils Absolute: 0.3 10*3/uL (ref 0.0–0.5)
Eosinophils Relative: 4 %
HCT: 27.3 % — ABNORMAL LOW (ref 36.0–46.0)
Hemoglobin: 8.8 g/dL — ABNORMAL LOW (ref 12.0–15.0)
Immature Granulocytes: 1 %
Lymphocytes Relative: 10 %
Lymphs Abs: 0.9 10*3/uL (ref 0.7–4.0)
MCH: 28.1 pg (ref 26.0–34.0)
MCHC: 32.2 g/dL (ref 30.0–36.0)
MCV: 87.2 fL (ref 80.0–100.0)
Monocytes Absolute: 1 10*3/uL (ref 0.1–1.0)
Monocytes Relative: 12 %
Neutro Abs: 6.7 10*3/uL (ref 1.7–7.7)
Neutrophils Relative %: 72 %
Platelets: 377 10*3/uL (ref 150–400)
RBC: 3.13 MIL/uL — ABNORMAL LOW (ref 3.87–5.11)
RDW: 16 % — ABNORMAL HIGH (ref 11.5–15.5)
WBC: 9.1 10*3/uL (ref 4.0–10.5)
nRBC: 0 % (ref 0.0–0.2)

## 2024-11-15 MED ORDER — FUROSEMIDE 10 MG/ML IJ SOLN
40.0000 mg | Freq: Two times a day (BID) | INTRAMUSCULAR | Status: AC
  Administered 2024-11-15 – 2024-11-16 (×4): 40 mg via INTRAVENOUS
  Filled 2024-11-15 (×4): qty 4

## 2024-11-15 MED ORDER — NYSTATIN 100000 UNIT/ML MT SUSP: 5.0000 mL | Freq: Four times a day (QID) | OROMUCOSAL | Status: AC

## 2024-11-15 MED ORDER — ALPRAZOLAM 0.5 MG PO TABS: 0.5000 mg | ORAL_TABLET | Freq: Two times a day (BID) | ORAL | 0 refills | Status: AC

## 2024-11-15 MED ORDER — HYDROCODONE-ACETAMINOPHEN 5-325 MG PO TABS: 1.0000 | ORAL_TABLET | Freq: Four times a day (QID) | ORAL | 0 refills | Status: DC | PRN

## 2024-11-15 MED ORDER — HYDRALAZINE HCL 20 MG/ML IJ SOLN: 10.0000 mg | Freq: Three times a day (TID) | INTRAMUSCULAR | Status: DC | PRN

## 2024-11-15 MED ORDER — SENNA 8.6 MG PO TABS: 1.0000 | ORAL_TABLET | Freq: Two times a day (BID) | ORAL | Status: AC

## 2024-11-15 MED ORDER — GABAPENTIN 300 MG PO CAPS: 600.0000 mg | ORAL_CAPSULE | Freq: Every day | ORAL | Status: DC

## 2024-11-15 MED ORDER — GABAPENTIN 300 MG PO CAPS
300.0000 mg | ORAL_CAPSULE | Freq: Every day | ORAL | Status: DC
  Administered 2024-11-15 – 2024-11-16 (×2): 300 mg via ORAL
  Filled 2024-11-15 (×2): qty 1

## 2024-11-15 MED ORDER — LIDOCAINE 5 % EX PTCH: 1.0000 | MEDICATED_PATCH | CUTANEOUS | Status: AC

## 2024-11-15 MED ORDER — VITAMIN B-12 1000 MCG PO TABS: 1000.0000 ug | ORAL_TABLET | Freq: Every day | ORAL | Status: AC

## 2024-11-15 MED ORDER — DOCUSATE SODIUM 100 MG PO CAPS: 100.0000 mg | ORAL_CAPSULE | Freq: Two times a day (BID) | ORAL | Status: AC

## 2024-11-15 MED ORDER — ENSURE PLUS HIGH PROTEIN PO LIQD: 237.0000 mL | Freq: Two times a day (BID) | ORAL | Status: AC

## 2024-11-15 MED ORDER — POTASSIUM CHLORIDE CRYS ER 20 MEQ PO TBCR
40.0000 meq | EXTENDED_RELEASE_TABLET | Freq: Two times a day (BID) | ORAL | Status: AC
  Administered 2024-11-15 – 2024-11-16 (×4): 40 meq via ORAL
  Filled 2024-11-15 (×4): qty 2

## 2024-11-15 MED ORDER — KETOROLAC TROMETHAMINE 15 MG/ML IJ SOLN
15.0000 mg | Freq: Four times a day (QID) | INTRAMUSCULAR | Status: AC | PRN
  Administered 2024-11-15 (×2): 15 mg via INTRAVENOUS
  Filled 2024-11-15 (×2): qty 1

## 2024-11-15 MED ORDER — GABAPENTIN 300 MG PO CAPS: 300.0000 mg | ORAL_CAPSULE | Freq: Every day | ORAL | Status: DC

## 2024-11-15 MED ORDER — ESCITALOPRAM OXALATE 5 MG PO TABS: 5.0000 mg | ORAL_TABLET | ORAL | Status: AC

## 2024-11-15 NOTE — Progress Notes (Signed)
 Occupational Therapy Treatment Patient Details Name: [name] MRN: [id number] DOB: 1946-05-17 Today's Date: 11/15/2024   History of present illness Pt is a 79 y.o. F presenting to [facility] ED following a fall at home. IMG shows L4-L5 fx with significant distraction. Pt is s/p L4-L5 ORIF. Pt recently admitted 10/01/21 for a fall sustaining T7-T8 fx dislocation and is s/p posterior fixation of T6-T9. MRI negative for acute abnormality 1/28 + for L DVT. PMH is significant for T11 kyphoplasty, DVT, and PE.   OT comments  Pt continues to need extensive +2 assist for bed level and OOB mobility. Stood with stedy x 1 to switch out hospital bed. Decreased activity tolerance. Patient will benefit from continued inpatient follow up therapy, <3 hours/day.      If plan is discharge home, recommend the following:  Two people to help with walking and/or transfers;Two people to help with bathing/dressing/bathroom;Assistance with cooking/housework;Assist for transportation;Help with stairs or ramp for entrance   Equipment Recommendations  None recommended by OT    Recommendations for Other Services      Precautions / Restrictions Precautions Precautions: Back;Fall Precaution Booklet Issued: No Recall of Precautions/Restrictions: Impaired Precaution/Restrictions Comments: reminders for log roll technique Restrictions Weight Bearing Restrictions Per Provider Order: No       Mobility Bed Mobility Overal bed mobility: Needs Assistance Bed Mobility: Rolling, Sidelying to Sit, Sit to Sidelying Rolling: +2 for physical assistance, Max assist Sidelying to sit: +2 for physical assistance, Max assist     Sit to sidelying: Max assist, +2 for physical assistance General bed mobility comments: cues for log roll technique, Assist for all aspects    Transfers Overall transfer level: Needs assistance Equipment used: Ambulation equipment used Transfers: Sit to/from Stand Sit to Stand: Mod assist, +2  physical assistance, From elevated surface           General transfer comment: STS from elevated EOB with mod A +2 using bedpad. Improved power up from elevated stedy paddles Transfer via Lift Equipment: Stedy   Balance Overall balance assessment: Needs assistance   Sitting balance-Leahy Scale: Fair     Standing balance support: Bilateral upper extremity supported, During functional activity, Reliant on assistive device for balance Standing balance-Leahy Scale: Zero                             ADL either performed or assessed with clinical judgement   ADL Overall ADL's : Needs assistance/impaired                     Lower Body Dressing: Total assistance;Bed level                      Extremity/Trunk Assessment              Vision       Perception     Praxis     Communication Communication Communication: No apparent difficulties   Cognition Arousal: Alert Behavior During Therapy: WFL for tasks assessed/performed Cognition: Cognition impaired     Awareness: Intellectual awareness impaired, Online awareness impaired Memory impairment (select all impairments): Short-term memory, Working Biochemist, Clinical functioning impairment (select all impairments): Initiation, Sequencing, Reasoning, Problem solving OT - Cognition Comments: pt upset she needs to go to rehab instead of home                 Following commands: Impaired Following commands impaired: Follows one step  commands with increased time      Cueing   Cueing Techniques: Verbal cues, Gestural cues, Tactile cues  Exercises      Shoulder Instructions       General Comments      Pertinent Vitals/ Pain       Pain Assessment Pain Assessment: Faces Faces Pain Scale: Hurts whole lot Pain Location: back, LLE, R hip Pain Descriptors / Indicators: Grimacing, Guarding Pain Intervention(s): Monitored during session, Repositioned  Home Living                                           Prior Functioning/Environment              Frequency  Min 2X/week        Progress Toward Goals  OT Goals(current goals can now be found in the care plan section)  Progress towards OT goals: Progressing toward goals  Acute Rehab OT Goals OT Goal Formulation: With patient Time For Goal Achievement: 11/22/24 Potential to Achieve Goals: Fair  Plan      Co-evaluation    PT/OT/SLP Co-Evaluation/Treatment: Yes Reason for Co-Treatment: For patient/therapist safety;To address functional/ADL transfers PT goals addressed during session: Mobility/safety with mobility;Balance;Proper use of DME OT goals addressed during session: Strengthening/ROM      AM-PAC OT 6 Clicks Daily Activity     Outcome Measure   Help from another person eating meals?: A Little Help from another person taking care of personal grooming?: A Little Help from another person toileting, which includes using toliet, bedpan, or urinal?: Total Help from another person bathing (including washing, rinsing, drying)?: A Lot Help from another person to put on and taking off regular upper body clothing?: A Lot Help from another person to put on and taking off regular lower body clothing?: Total 6 Click Score: 12    End of Session Equipment Utilized During Treatment: Gait belt  OT Visit Diagnosis: Pain;Muscle weakness (generalized) (M62.81);Other symptoms and signs involving cognitive function   Activity Tolerance Patient tolerated treatment well   Patient Left in bed;with call bell/phone within reach;with nursing/sitter in room   Nurse Communication Need for lift equipment        Time: 8565-8491 OT Time Calculation (min): 34 min  Charges: OT General Charges $OT Visit: 1 Visit OT Treatments $Therapeutic Activity: 8-22 mins  Mliss HERO, OTR/L Acute Rehabilitation Services Office: [phone]   Kennth Mliss Helling 11/15/2024, 4:35 PM

## 2024-11-15 NOTE — TOC Progression Note (Signed)
 Transition of Care [facility]) - Progression Note    Patient Details  Name: [name] MRN: [id number] Date of Birth: 10-04-1946  Transition of [facility] CM/SW Contact  Bridget Cordella Simmonds, LCSW Phone Number: 11/15/2024, 2:56 PM  Clinical Narrative:   CSW confirmed with [facility]: they do have bed available tomorrow.  CSW LM with [facility] regarding potential DC tomorrow.     Expected Discharge Plan: IP Rehab Facility Barriers to Discharge: Continued Medical Work up, Air Traffic Controller and Services   Discharge Planning Services: CM Consult   Living arrangements for the past 2 months: Single Family Home                                       Social Drivers of Health (SDOH) Interventions SDOH Screenings   Food Insecurity: No Food Insecurity (11/01/2024)  Housing: Low Risk (11/01/2024)  Transportation Needs: No Transportation Needs (11/01/2024)  Utilities: Not At Risk (11/01/2024)  Social Connections: Socially Integrated (11/01/2024)  Tobacco Use: Low Risk (11/01/2024)    Readmission Risk Interventions     No data to display

## 2024-11-15 NOTE — Progress Notes (Signed)
 Physical Therapy Treatment Patient Details Name: [name] MRN: [id number] DOB: [birth year] Today's Date: 11/15/2024   History of Present Illness Pt is a 79 y.o. F presenting to [facility] ED following a fall at home. IMG shows L4-L5 fx with significant distraction. Pt is s/p L4-L5 ORIF. Pt recently admitted 10/01/21 for a fall sustaining T7-T8 fx dislocation and is s/p posterior fixation of T6-T9. MRI negative for acute abnormality PMH is significant for T11 kyphoplasty, DVT, and PE.    PT Comments  Pt received in supine and agreeable to session. RN reports need to switch out bed, so session focused on standing transfer to the stedy. Pt continues to require up to max A +2 for bed mobility due to pain and weakness. Pt able to stand from elevated EOB with mod A +2 and tolerate sitting on stedy paddles for extended time to switch bed, however pt reports increased pain and fatigue after. Pt continues to benefit from PT services to progress toward functional mobility goals.    If plan is discharge home, recommend the following: Two people to help with walking and/or transfers;Two people to help with bathing/dressing/bathroom;Assistance with cooking/housework;Assist for transportation;Help with stairs or ramp for entrance   Can travel by private vehicle     No  Equipment Recommendations  Hoyer lift;Wheelchair cushion (measurements PT);Wheelchair (measurements PT);BSC/3in1    Recommendations for Other Services       Precautions / Restrictions Precautions Precautions: Back;Fall Recall of Precautions/Restrictions: Impaired Precaution/Restrictions Comments: cues for log roll technique Restrictions Weight Bearing Restrictions Per Provider Order: No     Mobility  Bed Mobility Overal bed mobility: Needs Assistance Bed Mobility: Rolling, Sidelying to Sit, Sit to Sidelying Rolling: +2 for physical assistance, Max assist Sidelying to sit: +2 for physical assistance, Max assist     Sit to  sidelying: Max assist, +2 for physical assistance General bed mobility comments: cues for log roll technique, Assist for all aspects    Transfers Overall transfer level: Needs assistance Equipment used: Ambulation equipment used Transfers: Sit to/from Stand Sit to Stand: Mod assist, +2 physical assistance, From elevated surface           General transfer comment: STS from elevated EOB with mod A +2 using bedpad. Improved power up from elevated stedy paddles    Ambulation/Gait                   Stairs             Wheelchair Mobility     Tilt Bed    Modified Rankin (Stroke Patients Only)       Balance Overall balance assessment: Needs assistance Sitting-balance support: Bilateral upper extremity supported, Feet supported Sitting balance-Leahy Scale: Fair Sitting balance - Comments: CGA sitting EOB statically   Standing balance support: Bilateral upper extremity supported, During functional activity, Reliant on assistive device for balance Standing balance-Leahy Scale: Zero Standing balance comment: reliant on stedy and external support                            Communication Communication Communication: No apparent difficulties  Cognition Arousal: Alert Behavior During Therapy: WFL for tasks assessed/performed, Anxious   PT - Cognitive impairments: No apparent impairments                         Following commands: Impaired Following commands impaired: Follows one step commands with increased time    Cueing  Cueing Techniques: Verbal cues, Gestural cues, Tactile cues  Exercises      General Comments        Pertinent Vitals/Pain Pain Assessment Pain Assessment: Faces Faces Pain Scale: Hurts whole lot Pain Location: back, LLE, R hip Pain Descriptors / Indicators: Grimacing, Guarding Pain Intervention(s): Monitored during session, Repositioned     PT Goals (current goals can now be found in the care plan section)  Acute Rehab PT Goals Patient Stated Goal: to get stronger PT Goal Formulation: With patient/family Time For Goal Achievement: 11/21/24 Progress towards PT goals: Progressing toward goals    Frequency    Min 3X/week           Co-evaluation PT/OT/SLP Co-Evaluation/Treatment: Yes Reason for Co-Treatment: For patient/therapist safety;To address functional/ADL transfers PT goals addressed during session: Mobility/safety with mobility;Balance;Proper use of DME        AM-PAC PT 6 Clicks Mobility   Outcome Measure  Help needed turning from your back to your side while in a flat bed without using bedrails?: Total Help needed moving from lying on your back to sitting on the side of a flat bed without using bedrails?: Total Help needed moving to and from a bed to a chair (including a wheelchair)?: Total Help needed standing up from a chair using your arms (e.g., wheelchair or bedside chair)?: Total Help needed to walk in hospital room?: Total Help needed climbing 3-5 steps with a railing? : Total 6 Click Score: 6    End of Session Equipment Utilized During Treatment: Gait belt Activity Tolerance: Patient limited by pain;Treatment limited secondary to medical complications (Comment) Patient left: with call bell/phone within reach;in bed;with family/visitor present;with bed alarm set;with nursing/sitter in room Nurse Communication: Mobility status;Need for lift equipment PT Visit Diagnosis: Other abnormalities of gait and mobility (R26.89);Muscle weakness (generalized) (M62.81);Pain Pain - Right/Left: Right Pain - part of body: Hip     Time: 8563-8494 PT Time Calculation (min) (ACUTE ONLY): 29 min  Charges:    $Therapeutic Activity: 8-22 mins PT General Charges $$ ACUTE PT VISIT: 1 Visit                    Darryle George, PTA Acute Rehabilitation Services Secure Chat Preferred  [phone]    Darryle George 11/15/2024, 3:37 PM

## 2024-11-15 NOTE — Progress Notes (Signed)
 Patient ID: [name], female   DOB: 06/23/1946, 79 y.o.   MRN: [id number] Vital signs are stable the patient is awake and alert he has been mobilized some but minimally.  Historically I note the patient seems to do best with hydrocodone  for pain control if used this on her past surgeries.  This may be her preferential pain medicine.  Dilaudid  does tend to make her sleepy when she gets it.  I reviewed the CT with her and I note that the patient has a retroperitoneal hematoma related to the fracture itself.  I doubt that this is likely to enlarge any as it has been there for over a week since her initial injury.  My feeling is therefore that it is not reticular [place] unsafe to anticoagulate or if this is necessary to treat deep venous thrombosis or worse yet pulmonary emboli.  Appreciate Dr. Lazaro such as concern regarding the presence of the hematomas.  I discussed her situation with the family members present including his son daughter and her husband.  Mobilization continues to be important.  White count seems to have stabilized.

## 2024-11-15 NOTE — Progress Notes (Signed)
 [name]   DOB:March 30, 1946   FM#:[id number]    ASSESSMENT & PLAN:   History of recurrent DVT/PE, IVC filter placement and on chronic anticoagulation therapy With recent fall and significant retroperitoneal hematoma, I recommended no anticoagulation therapy for minimum of 30   She underwent venous Doppler ultrasound of all extremities on November 14, 2024, report indicated acute superficial vein thrombosis involving the left cephalic vein and the upper extremity and acute deep vein thrombosis involving the left common femoral vein, left femoral vein and left popliteal vein There is risk of untreated recurrent blood clots versus recent severe life-threatening retroperitoneal hematoma  I discussed the difficulties of making this decision with the husband and son on November 13, 2024 and at that time, we have reached consensus of no anticoagulation therapy Overall, my recommendation has not changed She remained frail and at risk of fall at all times I do not recommend treatment with anticoagulation therapy for 30 days from the date of admission on October 31, 2024 Her son has my phone number to contact me for outpatient follow-up once she is discharged from skilled nursing facility  Acute blood loss anemia Continue transfusion support as indicated  Leukocytosis, resolved Cause unknown, could be postop related Monitor closely  Discharge planning Will defer to primary service I will sign off  Gail Bedford, MD 11/15/2024 3:53 PM  Subjective:  I was asked to reevaluate the patient due to new findings of acute DVT in the lower extremity by ultrasound, performed yesterday.  The patient is sleeping.  Family members not available  Objective:  Vitals:   11/15/24 0948 11/15/24 1505  BP: (!) 152/60 (!) 152/63  Pulse: 71 69  Resp: 16 18  Temp: 98.7 F (37.1 C) 98.1 F (36.7 C)  SpO2: 94% 96%     Intake/Output Summary (Last 24 hours) at 11/15/2024 1553 Last data filed at 11/15/2024  1500 Gross per 24 hour  Intake --  Output 3750 ml  Net -3750 ml

## 2024-11-15 NOTE — Progress Notes (Signed)
 "        [facility]                                                                               [name], is a 79 y.o. female, DOB - 1945-10-28, FMW:[id number] Admit date - 10/31/2024    Outpatient Primary MD for the patient is Onita Rush, MD  LOS - 15  days   HPI [name] is an 79 y.o. 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 CT of the lumbar spine showed fracture of old L4-L5 fusion with significant distraction.  Neurosurgery was consulted who recommended bedrest and a 30 degree angle for pain control and rehab.    Assessment and Plan: Acute close lumbar fracture S/p open reduction at the L4-L5 Repeat CT of the lumbar spine without contrast shows recent hyperextension injury at L5-S1 status post interval posterior fusion. Unchanged anterior distraction of the L5-S1 disc space. Regional postoperative changes and retroperitoneal hematoma. Neurosurgery on board recommending therapy evaluations at this time Pain control  Leukocytosis Resolved So far work up has been negative.  CXR is negative for infection.  UA is negative for infection.  MRI brain unremarkable.  CT lumbar spine with and without contrast, unremarkable for any post op infection As per Dr Colon the incision site looks clean.   Acute encephalopathy, unclear etiology, likely med induced Improved Suspect hospital delirium vs from IV dilaudid   Some hallucinations too. She has some involuntary jerks, myoclonic jerks ? From gabapentin , she is on high dose of gabapentin  600 mg TID, held Gabapentin  on 11/13/24, restart gabapentin  at a reduced dose of 600 mg at bedtime CT head with out contrast is negative. MRI brain does not show acute intracranial abnormalities.  TSh wnl.  Ammonia levels is wnl. Vit b12 level is low normal. Supplementation  ordered.  Redirection and frequent orientation recommended  LUE/BLEswelling Acute DVT in LLE Noted significant edema ProBNP 1,247 Echo done on 07/2024 showed EF of 60 to 65%, no regional wall motion abnormality, left ventricular diastolic parameters were normal Doppler LUE with superficial thrombus in cephalic vein Doppler left lower extremity with acute DVT extending from the common femoral vein, into the popliteal vein Continue IV Lasix  and monitor (patient takes Lasix  at home for edema) Restarted Xarelto  on 1/28, Dr Lonn will re-evaluate pt, but may likely hold Xarelto  as pt is high risk for post op bleeding and has an IVC filter in place.  Hypokalemia Replace as needed  Possible oral thrush Nystatin  swish and swallow  Acute on chronic anemia Normocytic anemia Secondary to retroperitoneal hematoma Daily CBC  History of PE/DVT and an IVC filter in place Held xarelto  for the surgery and post op paravertebral hematoma and retroperitoneal hematoma  Dr Lonn with hematology recommended to hold off on anti coagulation therapy for atleast 30 days  Acute urinary retention Patient had post voiding residual of more than 200 . Foley catheter was placed  Hypothyroidism Continue Synthroid   Lymphedema On p.o. Lasix  at home BLE Doppler as above Continue IV Lasix   Hyponatremia ??Improved s/p  IV fluid, will DC as patient has significant edema Unclear etiology. Suspect from poor oral intake  Hypertension BP uncontrolled Increased cozaar  to 100 mg daily, continue atenolol  Patient has prn hydralazine  IV  Chronic swelling in the back of the right ear Firm, non tender, not associated with any drainage. She denies any pain.  Recommend outpatient follow up with dermatology.   Morbid Obesity Body mass index is 52.26 kg/m. Poor prognostic factor  Estimated body mass index is 52.26 kg/m as calculated from the following:   Height as of this encounter: 5' 4 (1.626 m).    Weight as of this encounter: 138.1 kg.  Code Status: Full code DVT Prophylaxis:  Place and maintain sequential compression device Start: 11/06/24 0848 SCD's Start: 11/01/24 2359 rivaroxaban  (XARELTO ) tablet 20 mg   Level of Care: Level of care: Med-Surg Family Communication: None at bedside  Disposition Plan:     Remains inpatient appropriate:  pending her participation in therapy. .    Procedures:  S/p lumbar fusion surgery.   Consultants:   NS  Hematology oncology.   Antimicrobials:   Anti-infectives (From admission, onward)    Start     Dose/Rate Route Frequency Ordered Stop   11/09/24 2200  cephALEXin  (KEFLEX ) capsule 500 mg        500 mg Oral Every 8 hours 11/09/24 1519 11/10/24 1434   11/06/24 1400  cephALEXin  (KEFLEX ) capsule 500 mg  Status:  Discontinued        500 mg Oral Every 8 hours 11/06/24 1036 11/09/24 1519   11/02/24 0045  ceFAZolin  (ANCEF ) IVPB 2g/100 mL premix        2 g 200 mL/hr over 30 Minutes Intravenous Every 8 hours 11/01/24 2358 11/02/24 0930        Medications  Scheduled Meds:  acidophilus  1 capsule Oral q AM   ALPRAZolam   0.5 mg Oral BID   atenolol   25 mg Oral Daily   Chlorhexidine  Gluconate Cloth  6 each Topical Daily   [START ON 11/20/2024] cyanocobalamin   1,000 mcg Intramuscular Q7 days   docusate sodium   100 mg Oral BID   escitalopram   5 mg Oral QODAY   famotidine   40 mg Oral QHS   feeding supplement  237 mL Oral BID BM   furosemide   40 mg Intravenous BID   levothyroxine   25 mcg Oral Q M,W,F   lidocaine   1 patch Transdermal Q24H   losartan   100 mg Oral Daily   nystatin   5 mL Oral QID   pantoprazole   40 mg Oral Daily   potassium chloride   40 mEq Oral BID   rivaroxaban   20 mg Oral q1600   senna  1 tablet Oral BID   simvastatin   20 mg Oral QHS   sodium chloride  flush  3 mL Intravenous Q12H   Continuous Infusions:   PRN Meds:.acetaminophen  **OR** acetaminophen , alum & mag hydroxide-simeth, atenolol  **AND** atenolol , bisacodyl ,  bisacodyl , haloperidol  lactate, hydrALAZINE , HYDROcodone -acetaminophen , melatonin, menthol  **OR** phenol, methocarbamol  **OR** methocarbamol  (ROBAXIN ) injection, ondansetron  **OR** ondansetron  (ZOFRAN ) IV, polyethylene glycol, sodium chloride  flush, sodium phosphate     Subjective:   [name] was seen and examined today.  Reports difficulty sleeping at night.  Noted to be sleepy this morning, but able to answer my questions appropriately.  Objective:   Vitals:   11/14/24 2000 11/15/24 0432 11/15/24 0948 11/15/24 1505  BP: (!) 162/61 (!) 181/66 (!) 152/60 (!) 152/63  Pulse: 73 73 71 69  Resp: 18 16 16 18   Temp: 98.7 F (  37.1 C) 98 F (36.7 C) 98.7 F (37.1 C) 98.1 F (36.7 C)  TempSrc: Oral Oral    SpO2: 99% 96% 94% 96%  Weight:      Height:        Intake/Output Summary (Last 24 hours) at 11/15/2024 1528 Last data filed at 11/14/2024 2000 Gross per 24 hour  Intake --  Output 2350 ml  Net -2350 ml   Filed Weights   10/31/24 1832 11/02/24 0144  Weight: 124.3 kg (!) 138.1 kg     Exam General: NAD, drowsy/sleepy, generalized edema, chronically ill-appearing Cardiovascular: S1, S2 present Respiratory: CTAB Abdomen: Soft, nontender, nondistended, bowel sounds present Musculoskeletal: bilateral pedal edema noted, LUE edema noted Skin: Normal Psychiatry: Normal mood         Data Reviewed:  I have personally reviewed following labs and imaging studies   CBC Lab Results  Component Value Date   WBC 9.1 11/15/2024   RBC 3.13 (L) 11/15/2024   HGB 8.8 (L) 11/15/2024   HCT 27.3 (L) 11/15/2024   MCV 87.2 11/15/2024   MCH 28.1 11/15/2024   PLT 377 11/15/2024   MCHC 32.2 11/15/2024   RDW 16.0 (H) 11/15/2024   LYMPHSABS 0.9 11/15/2024   MONOABS 1.0 11/15/2024   EOSABS 0.3 11/15/2024   BASOSABS 0.1 11/15/2024     Last metabolic panel Lab Results  Component Value Date   NA 135 11/15/2024   K 3.3 (L) 11/15/2024   CL 100 11/15/2024   CO2 26 11/15/2024    BUN 22 11/15/2024   CREATININE 0.86 11/15/2024   GLUCOSE 111 (H) 11/15/2024   GFRNONAA >60 11/15/2024   GFRAA >60 08/19/2018   CALCIUM  9.7 11/15/2024   PROT 6.5 11/12/2024   ALBUMIN 3.6 11/12/2024   BILITOT 0.9 11/12/2024   ALKPHOS 77 11/12/2024   AST 30 11/12/2024   ALT 21 11/12/2024   ANIONGAP 9 11/15/2024    CBG (last 3)  No results for input(s): GLUCAP in the last 72 hours.    Coagulation Profile: No results for input(s): INR, PROTIME in the last 168 hours.   Radiology Studies: VAS US  LOWER EXTREMITY VENOUS (DVT) Result Date: 11/14/2024  Lower Venous DVT Study Patient Name:  [name]  Date of Exam:   11/14/2024 Medical Rec #: [id number]        Accession #:    [id number] Date of Birth: 1946-02-01        Patient Gender: F Patient Age:   50 years Exam Location:  [facility] Procedure:      VAS US  LOWER EXTREMITY VENOUS (DVT) Referring Phys: Kaimani Clayson --------------------------------------------------------------------------------  Indications: Swelling.  Risk Factors: None identified. Limitations: Body habitus, poor ultrasound/tissue interface and patient positioning, patient sitting in recliner, patient immobility, poor pain tolerance. Comparison Study: No prior studies. Performing Technologist: Cordella Collet RVT  Examination Guidelines: A complete evaluation includes B-mode imaging, spectral Doppler, color Doppler, and power Doppler as needed of all accessible portions of each vessel. Bilateral testing is considered an integral part of a complete examination. Limited examinations for reoccurring indications may be performed as noted. The reflux portion of the exam is performed with the patient in reverse Trendelenburg.  +---------+---------------+---------+-----------+----------+-------------------+ RIGHT    CompressibilityPhasicitySpontaneityPropertiesThrombus Aging      +---------+---------------+---------+-----------+----------+-------------------+  CFV      Full           Yes      Yes                                      +---------+---------------+---------+-----------+----------+-------------------+  SFJ      Full                                                             +---------+---------------+---------+-----------+----------+-------------------+ FV Prox  Full                                                             +---------+---------------+---------+-----------+----------+-------------------+ FV Mid   Full                                                             +---------+---------------+---------+-----------+----------+-------------------+ FV DistalFull                                                             +---------+---------------+---------+-----------+----------+-------------------+ PFV      Full                                                             +---------+---------------+---------+-----------+----------+-------------------+ POP      Full           Yes      Yes                                      +---------+---------------+---------+-----------+----------+-------------------+ PTV      Full                                                             +---------+---------------+---------+-----------+----------+-------------------+ PERO                                                  Not well visualized +---------+---------------+---------+-----------+----------+-------------------+   +---------+---------------+---------+-----------+----------+-------------------+ LEFT     CompressibilityPhasicitySpontaneityPropertiesThrombus Aging      +---------+---------------+---------+-----------+----------+-------------------+ CFV      Partial        Yes      Yes                  Acute               +---------+---------------+---------+-----------+----------+-------------------+ SFJ      Full                                                              +---------+---------------+---------+-----------+----------+-------------------+  FV Prox  Partial                                      Acute               +---------+---------------+---------+-----------+----------+-------------------+ FV Mid   Partial        No       No                   Acute               +---------+---------------+---------+-----------+----------+-------------------+ FV Distal                                             Not well visualized +---------+---------------+---------+-----------+----------+-------------------+ PFV      None           No       No                   Acute               +---------+---------------+---------+-----------+----------+-------------------+ POP      None           No       No                   Acute               +---------+---------------+---------+-----------+----------+-------------------+ PTV      Full                                                             +---------+---------------+---------+-----------+----------+-------------------+ PERO                                                  Not well visualized +---------+---------------+---------+-----------+----------+-------------------+ EIV                     Yes      Yes                                      +---------+---------------+---------+-----------+----------+-------------------+     Summary: RIGHT: - There is no evidence of deep vein thrombosis in the lower extremity. However, portions of this examination were limited- see technologist comments above.  - No cystic structure found in the popliteal fossa.  LEFT: - Findings consistent with acute deep vein thrombosis involving the left common femoral vein, left femoral vein, and left popliteal vein.  - No cystic structure found in the popliteal fossa.  *See table(s) above for measurements and observations. Electronically signed by Debby Robertson on 11/14/2024 at 4:12:50 PM.    Final    VAS US   UPPER EXTREMITY VENOUS DUPLEX Result Date: 11/14/2024 UPPER VENOUS STUDY  Patient Name:  [name]  Date of Exam:   11/14/2024 Medical Rec #: [id number]  Accession #:    [id number] Date of Birth: [birth year]        Patient Gender: F Patient Age:   78 years Exam Location:  [facility] Procedure:      VAS US  UPPER EXTREMITY VENOUS DUPLEX Referring Phys: Camden Mazzaferro --------------------------------------------------------------------------------  Indications: Swelling Risk Factors: None identified. Limitations: Poor ultrasound/tissue interface. Comparison Study: No prior studies. Performing Technologist: Cordella Collet RVT  Examination Guidelines: A complete evaluation includes B-mode imaging, spectral Doppler, color Doppler, and power Doppler as needed of all accessible portions of each vessel. Bilateral testing is considered an integral part of a complete examination. Limited examinations for reoccurring indications may be performed as noted.  Right Findings: +----------+------------+---------+-----------+----------+-------+ RIGHT     CompressiblePhasicitySpontaneousPropertiesSummary +----------+------------+---------+-----------+----------+-------+ Subclavian               Yes       Yes                      +----------+------------+---------+-----------+----------+-------+  Left Findings: +----------+------------+---------+-----------+----------+-------+ LEFT      CompressiblePhasicitySpontaneousPropertiesSummary +----------+------------+---------+-----------+----------+-------+ IJV           Full       Yes       Yes                      +----------+------------+---------+-----------+----------+-------+ Subclavian               Yes       Yes                      +----------+------------+---------+-----------+----------+-------+ Axillary      Full       Yes       Yes                       +----------+------------+---------+-----------+----------+-------+ Brachial      Full                                          +----------+------------+---------+-----------+----------+-------+ Radial        Full                                          +----------+------------+---------+-----------+----------+-------+ Ulnar         Full                                          +----------+------------+---------+-----------+----------+-------+ Cephalic      None                                   Acute  +----------+------------+---------+-----------+----------+-------+ Basilic       Full                                          +----------+------------+---------+-----------+----------+-------+ Superficial thrombus located in the cephalic vein is noted in the proximal forearm only.  Summary:  Right: No evidence of thrombosis in the subclavian.  Left: No evidence of deep vein thrombosis  in the upper extremity. Findings consistent with acute superficial vein thrombosis involving the left cephalic vein.  *See table(s) above for measurements and observations.  Diagnosing physician: Debby Robertson Electronically signed by Debby Robertson on 11/14/2024 at 4:10:49 PM.    Final    CT LUMBAR SPINE WO CONTRAST Result Date: 11/14/2024 EXAM: CT OF THE LUMBAR SPINE WITHOUT CONTRAST 11/14/2024 12:47:22 AM TECHNIQUE: CT of the lumbar spine was performed without the administration of intravenous contrast. Multiplanar reformatted images are provided for review. Automated exposure control, iterative reconstruction, and/or weight based adjustment of the mA/kV was utilized to reduce the radiation dose to as low as reasonably achievable. COMPARISON: CT of the lumbar spine dated 11/05/2024. CLINICAL HISTORY: Retroperitoneal hematoma, evaluate for infection. FINDINGS: BONES AND ALIGNMENT: Normal vertebral body heights. Status post recent bilateral posterolateral spinal fusion at L5-S1 with placement of  radiopaque graft material within the distracted L5-S1 disc space. Since previous study, some of the radiopaque graft material has extruded anteriorly. Some material has also extruded posterolaterally on the left within the neural foramen. There is no apparent impingement upon the exiting nerve root. Status post bilateral posterolateral spinal fusion of T11 through L3, as seen previously. There is mild dextroscoliosis of the thoracolumbar spine as before. The posterior elements remained ankylosed extending from T10 through L5. Slight fracture is again demonstrated at the L5-S1 level. No suspicious bone lesion. DEGENERATIVE CHANGES: The posterior elements remained ankylosed extending from T10 through L5. Radiopaque graft material within the distracted L5-S1 disc space. SOFT TISSUES: The soft tissue gas noted on the previous study has resolved. There has also been interval improvement of prevertebral and retroperitoneal soft tissue stranding. The left psoas muscle remains atrophic. There are no findings suspicious for infection. An IVC filter is present. There is moderate calcific atheromatous disease within the abdominal aorta. The patient is status post cholecystectomy. IMPRESSION: 1. Status post recent bilateral posterolateral spinal fusion at L5-S1 with placement of radiopaque graft material within the distracted L5-S1 disc space. Since the previous study, some of the radiopaque graft material has extruded anteriorly and posterolaterally on the left within the neural foramen, without apparent impingement upon the exiting nerve root. 2. Interval resolution of prior soft tissue gas with improvement in prevertebral/retroperitoneal soft tissue stranding. No findings suspicious for infection. 3. Status post bilateral posterolateral spinal fusion of T11 through L3, as seen previously, with ankylosis of the posterior elements extending from T10 through L5. 4. Mild dextroscoliosis of the thoracolumbar spine as before.  Electronically signed by: Evalene Coho MD 11/14/2024 05:02 AM EST RP Workstation: [id number]       Lebron JINNY Cage M.D. [facility] 11/15/2024, 3:28 PM  Available via Epic secure chat 7am-7pm After 7 pm, please refer to night coverage provider listed on amion.    "

## 2024-11-16 ENCOUNTER — Inpatient Hospital Stay (HOSPITAL_COMMUNITY)

## 2024-11-16 DIAGNOSIS — I2699 Other pulmonary embolism without acute cor pulmonale: Secondary | ICD-10-CM | POA: Diagnosis not present

## 2024-11-16 DIAGNOSIS — S32059D Unspecified fracture of fifth lumbar vertebra, subsequent encounter for fracture with routine healing: Secondary | ICD-10-CM | POA: Diagnosis not present

## 2024-11-16 DIAGNOSIS — D62 Acute posthemorrhagic anemia: Secondary | ICD-10-CM | POA: Diagnosis not present

## 2024-11-16 LAB — BASIC METABOLIC PANEL WITH GFR
Anion gap: 11 (ref 5–15)
BUN: 20 mg/dL (ref 8–23)
CO2: 28 mmol/L (ref 22–32)
Calcium: 9.7 mg/dL (ref 8.9–10.3)
Chloride: 99 mmol/L (ref 98–111)
Creatinine, Ser: 0.83 mg/dL (ref 0.44–1.00)
GFR, Estimated: >60 mL/min
Glucose, Bld: 112 mg/dL — ABNORMAL HIGH (ref 70–99)
Potassium: 3.8 mmol/L (ref 3.5–5.1)
Sodium: 138 mmol/L (ref 135–145)

## 2024-11-16 MED ORDER — GABAPENTIN 300 MG PO CAPS
300.0000 mg | ORAL_CAPSULE | Freq: Once | ORAL | Status: AC
  Administered 2024-11-16: 300 mg via ORAL
  Filled 2024-11-16: qty 1

## 2024-11-16 MED ORDER — KETOROLAC TROMETHAMINE 15 MG/ML IJ SOLN
15.0000 mg | Freq: Once | INTRAMUSCULAR | Status: AC
  Administered 2024-11-16: 15 mg via INTRAVENOUS
  Filled 2024-11-16: qty 1

## 2024-11-16 MED ORDER — FUROSEMIDE 10 MG/ML IJ SOLN: 40.0000 mg | Freq: Two times a day (BID) | INTRAMUSCULAR | Status: DC

## 2024-11-16 NOTE — Progress Notes (Signed)
 "        [facility]                                                                               [name], is a 79 y.o. female, DOB - 1946-10-15, FMW:[id number] Admit date - 10/31/2024    Outpatient Primary MD for the patient is Onita Rush, MD  LOS - 16  days   HPI [name] is an 79 y.o. 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 CT of the lumbar spine showed fracture of old L4-L5 fusion with significant distraction.  Neurosurgery was consulted who recommended bedrest and a 30 degree angle for pain control and rehab.    Assessment and Plan: Acute close lumbar fracture S/p open reduction at the L4-L5 Repeat CT of the lumbar spine without contrast shows recent hyperextension injury at L5-S1 status post interval posterior fusion. Unchanged anterior distraction of the L5-S1 disc space. Regional postoperative changes and retroperitoneal hematoma. Neurosurgery on board recommending therapy evaluations at this time Pain control  Leukocytosis Resolved So far work up has been negative.  CXR is negative for infection.  UA is negative for infection.  MRI brain unremarkable.  CT lumbar spine with and without contrast, unremarkable for any post op infection As per Dr Colon the incision site looks clean.   Acute encephalopathy, unclear etiology, likely med induced Improved Suspect hospital delirium vs from IV dilaudid   Some hallucinations too. She has some involuntary jerks, myoclonic jerks ? From gabapentin , she is on high dose of gabapentin  600 mg TID, held Gabapentin  on 11/13/24, restart gabapentin  at a reduced dose of 600 mg at bedtime CT head with out contrast is negative. MRI brain does not show acute intracranial abnormalities.  TSh wnl.  Ammonia levels is wnl. Vit b12 level is low normal. Supplementation  ordered.  Redirection and frequent orientation recommended  LUE/BLEswelling Acute DVT in LLE Left lower extremity pain/weakness Noted significant edema ProBNP 1,247 Echo done on 07/2024 showed EF of 60 to 65%, no regional wall motion abnormality, left ventricular diastolic parameters were normal Doppler LUE with superficial thrombus in cephalic vein Doppler left lower extremity with acute DVT extending from the common femoral vein, into the popliteal vein Continue IV Lasix  and monitor (patient takes Lasix  at home for edema) CT lumbar spine, x-ray left femur pending Restarted Xarelto  on 1/28, hem onc Dr Lonn recommend to continue to hold and plan to restart Xarelto  on 12/02/2024 (pt is fall high risk overall also has an IVC filter in place) Discussed extensively with Dr. Colon neurosurgery, Dr. Lonn heme-onc as well as patient and family extensively about the pros/cons of anticoagulation in this patient with a high fall risk as well as retroperitoneal bleed.  Family verbalized understanding  Hypokalemia Replace as needed  Possible oral thrush Nystatin  swish and swallow  Acute on chronic anemia Normocytic anemia Secondary to retroperitoneal hematoma Daily CBC  History of PE/DVT and an IVC filter in place Held xarelto  for the surgery and post op paravertebral hematoma and retroperitoneal hematoma  Dr Lonn with hematology  recommended to hold off on anti coagulation therapy for atleast 30 days as mentioned above  Acute urinary retention Patient had post voiding residual of more than 200 . Foley catheter was placed  Hypothyroidism Continue Synthroid   Lymphedema On p.o. Lasix  at home BLE Doppler as above Continue IV Lasix   Hyponatremia ??Improved s/p IV fluid, will DC as patient has significant edema Unclear etiology. Suspect from poor oral intake  Hypertension BP uncontrolled Increased cozaar  to 100 mg daily, continue atenolol  Patient has prn hydralazine   IV  Chronic swelling in the back of the right ear Firm, non tender, not associated with any drainage. She denies any pain.  Recommend outpatient follow up with dermatology.   Morbid Obesity Body mass index is 52.26 kg/m. Poor prognostic factor  Estimated body mass index is 52.26 kg/m as calculated from the following:   Height as of this encounter: 5' 4 (1.626 m).   Weight as of this encounter: 138.1 kg.  Code Status: Full code DVT Prophylaxis:  Place and maintain sequential compression device Start: 11/06/24 0848 SCD's Start: 11/01/24 2359   Level of Care: Level of care: Med-Surg Family Communication: None at bedside  Disposition Plan:     Remains inpatient appropriate:  pending her participation in therapy. .    Procedures:  S/p lumbar fusion surgery.   Consultants:   NS  Hematology oncology.   Antimicrobials:   Anti-infectives (From admission, onward)    Start     Dose/Rate Route Frequency Ordered Stop   11/09/24 2200  cephALEXin  (KEFLEX ) capsule 500 mg        500 mg Oral Every 8 hours 11/09/24 1519 11/10/24 1434   11/06/24 1400  cephALEXin  (KEFLEX ) capsule 500 mg  Status:  Discontinued        500 mg Oral Every 8 hours 11/06/24 1036 11/09/24 1519   11/02/24 0045  ceFAZolin  (ANCEF ) IVPB 2g/100 mL premix        2 g 200 mL/hr over 30 Minutes Intravenous Every 8 hours 11/01/24 2358 11/02/24 0930        Medications  Scheduled Meds:  acidophilus  1 capsule Oral q AM   ALPRAZolam   0.5 mg Oral BID   atenolol   25 mg Oral Daily   Chlorhexidine  Gluconate Cloth  6 each Topical Daily   [START ON 11/20/2024] cyanocobalamin   1,000 mcg Intramuscular Q7 days   docusate sodium   100 mg Oral BID   escitalopram   5 mg Oral QODAY   famotidine   40 mg Oral QHS   feeding supplement  237 mL Oral BID BM   gabapentin   300 mg Oral QHS   levothyroxine   25 mcg Oral Q M,W,F   lidocaine   1 patch Transdermal Q24H   losartan   100 mg Oral Daily   nystatin   5 mL Oral QID   pantoprazole    40 mg Oral Daily   potassium chloride   40 mEq Oral BID   senna  1 tablet Oral BID   simvastatin   20 mg Oral QHS   sodium chloride  flush  3 mL Intravenous Q12H   Continuous Infusions:   PRN Meds:.acetaminophen  **OR** acetaminophen , alum & mag hydroxide-simeth, atenolol  **AND** atenolol , bisacodyl , bisacodyl , hydrALAZINE , HYDROcodone -acetaminophen , melatonin, menthol  **OR** phenol, methocarbamol  **OR** methocarbamol  (ROBAXIN ) injection, ondansetron  **OR** ondansetron  (ZOFRAN ) IV, polyethylene glycol, sodium chloride  flush, sodium phosphate     Subjective:   [name] was seen and examined today.  Complains of left lower extremity pain and weakness.  CT lumbar spine as well as x-ray of the left hip  ordered.  Patient was scheduled to discharge to SNF today, but patient and family not comfortable as patient still reporting pain and now new weakness.  Objective:   Vitals:   11/16/24 0000 11/16/24 0409 11/16/24 0736 11/16/24 1444  BP:  (!) 150/60 (!) 143/61 115/77  Pulse:  72 70 69  Resp:  16 18 16   Temp:  98 F (36.7 C) 98.4 F (36.9 C) 98.7 F (37.1 C)  TempSrc:  Oral    SpO2: 95% 97% 96% 94%  Weight:      Height:        Intake/Output Summary (Last 24 hours) at 11/16/2024 1758 Last data filed at 11/16/2024 1700 Gross per 24 hour  Intake 120 ml  Output 4000 ml  Net -3880 ml   Filed Weights   10/31/24 1832 11/02/24 0144  Weight: 124.3 kg (!) 138.1 kg     Exam General: NAD, generalized edema, chronically ill-appearing Cardiovascular: S1, S2 present Respiratory: CTAB Abdomen: Soft, nontender, nondistended, bowel sounds present Musculoskeletal: bilateral pedal edema noted, LUE edema noted Skin: Normal Psychiatry: Normal mood         Data Reviewed:  I have personally reviewed following labs and imaging studies   CBC Lab Results  Component Value Date   WBC 9.1 11/15/2024   RBC 3.13 (L) 11/15/2024   HGB 8.8 (L) 11/15/2024   HCT 27.3 (L) 11/15/2024   MCV  87.2 11/15/2024   MCH 28.1 11/15/2024   PLT 377 11/15/2024   MCHC 32.2 11/15/2024   RDW 16.0 (H) 11/15/2024   LYMPHSABS 0.9 11/15/2024   MONOABS 1.0 11/15/2024   EOSABS 0.3 11/15/2024   BASOSABS 0.1 11/15/2024     Last metabolic panel Lab Results  Component Value Date   NA 138 11/16/2024   K 3.8 11/16/2024   CL 99 11/16/2024   CO2 28 11/16/2024   BUN 20 11/16/2024   CREATININE 0.83 11/16/2024   GLUCOSE 112 (H) 11/16/2024   GFRNONAA >60 11/16/2024   GFRAA >60 08/19/2018   CALCIUM  9.7 11/16/2024   PROT 6.5 11/12/2024   ALBUMIN 3.6 11/12/2024   BILITOT 0.9 11/12/2024   ALKPHOS 77 11/12/2024   AST 30 11/12/2024   ALT 21 11/12/2024   ANIONGAP 11 11/16/2024    CBG (last 3)  No results for input(s): GLUCAP in the last 72 hours.    Coagulation Profile: No results for input(s): INR, PROTIME in the last 168 hours.   Radiology Studies: No results found.      Lebron JINNY Cage M.D. [facility] 11/16/2024, 5:58 PM  Available via Epic secure chat 7am-7pm After 7 pm, please refer to night coverage provider listed on amion.    "

## 2024-11-16 NOTE — Progress Notes (Signed)
 Patient in 10/10 states that she is having increasing pain in her Right hip and also having some Numbness in her left leg. RN notified MD and orders were placed.

## 2024-11-16 NOTE — Progress Notes (Addendum)
 Mobility Specialist Progress Note:    11/16/24 0925  Mobility  Activity Pivoted/transferred from bed to chair  Level of Assistance Maximum assist, patient does 25-49% (+2)  Assistive Device Stedy  Activity Response Tolerated well  Mobility Referral Yes  Mobility visit 1 Mobility  Mobility Specialist Start Time (ACUTE ONLY) [id number]  Mobility Specialist Stop Time (ACUTE ONLY) 0925  Mobility Specialist Time Calculation (min) (ACUTE ONLY) 16 min   Received pt in bed and eager for mobility. Pt required ModA+2 to EOB and MaxA+2 STS w/ Stedy. Pt c/o back pain, otherwise tolerated well. Left pt in chair with personal belongings and call light within reach. All needs met. RN present.  Lavanda Pollack Mobility Specialist  Please contact via [employer] or  Rehab Office [phone]

## 2024-11-16 NOTE — TOC Progression Note (Signed)
 Transition of Care [facility]) - Progression Note    Patient Details  Name: [name] MRN: [id number] Date of Birth: 10/14/1946  [facility] CM/SW Contact  Bridget Cordella Simmonds, LCSW Phone Number: 11/16/2024, 1:20 PM  Clinical Narrative:   Per MD, no DC today.  CSW updated [place].  They can potentially admit over the weekend, would need DC summary by 2pm.      Expected Discharge Plan: IP Rehab Facility Barriers to Discharge: Continued Medical Work up, [employer] and Services   Discharge Planning Services: CM Consult   Living arrangements for the past 2 months: Single Family Home Expected Discharge Date: 11/16/24                                     Social Drivers of Health (SDOH) Interventions SDOH Screenings   Food Insecurity: No Food Insecurity (11/01/2024)  Housing: Low Risk (11/01/2024)  Transportation Needs: No Transportation Needs (11/01/2024)  Utilities: Not At Risk (11/01/2024)  Social Connections: Socially Integrated (11/01/2024)  Tobacco Use: Low Risk (11/01/2024)    Readmission Risk Interventions     No data to display

## 2024-11-16 NOTE — Progress Notes (Signed)
 Patient ID: [name], female   DOB: Dec 10, 1945, 79 y.o.   MRN: [id number] Patient was to be discharged today however she started having pain and weakness in the left lower extremity she describes pain in the region of the hip over the greater trochanter and this is acutely tender to palpation today she is describing some difficulty with weakness in her leg including a dorsiflexor her back has not had change in the pain however she does have a fracture dislocation at L4-L5 which has been stabilized.  I have advised and we will perform an x-ray of the left hip to make sure there is no significant injury or fracture there and I will obtain a CT scan of the lumbar spine.

## 2024-11-17 DIAGNOSIS — D62 Acute posthemorrhagic anemia: Secondary | ICD-10-CM | POA: Diagnosis not present

## 2024-11-17 DIAGNOSIS — I2699 Other pulmonary embolism without acute cor pulmonale: Secondary | ICD-10-CM | POA: Diagnosis not present

## 2024-11-17 LAB — BASIC METABOLIC PANEL WITH GFR
Anion gap: 9 (ref 5–15)
BUN: 25 mg/dL — ABNORMAL HIGH (ref 8–23)
CO2: 31 mmol/L (ref 22–32)
Calcium: 9.4 mg/dL (ref 8.9–10.3)
Chloride: 97 mmol/L — ABNORMAL LOW (ref 98–111)
Creatinine, Ser: 1.05 mg/dL — ABNORMAL HIGH (ref 0.44–1.00)
GFR, Estimated: 54 mL/min — ABNORMAL LOW
Glucose, Bld: 108 mg/dL — ABNORMAL HIGH (ref 70–99)
Potassium: 3.6 mmol/L (ref 3.5–5.1)
Sodium: 137 mmol/L (ref 135–145)

## 2024-11-17 LAB — CBC WITH DIFFERENTIAL/PLATELET
Abs Immature Granulocytes: 0.06 10*3/uL (ref 0.00–0.07)
Basophils Absolute: 0.1 10*3/uL (ref 0.0–0.1)
Basophils Relative: 1 %
Eosinophils Absolute: 0.4 10*3/uL (ref 0.0–0.5)
Eosinophils Relative: 5 %
HCT: 29.4 % — ABNORMAL LOW (ref 36.0–46.0)
Hemoglobin: 9.4 g/dL — ABNORMAL LOW (ref 12.0–15.0)
Immature Granulocytes: 1 %
Lymphocytes Relative: 16 %
Lymphs Abs: 1.2 10*3/uL (ref 0.7–4.0)
MCH: 28.1 pg (ref 26.0–34.0)
MCHC: 32 g/dL (ref 30.0–36.0)
MCV: 87.8 fL (ref 80.0–100.0)
Monocytes Absolute: 1 10*3/uL (ref 0.1–1.0)
Monocytes Relative: 14 %
Neutro Abs: 4.8 10*3/uL (ref 1.7–7.7)
Neutrophils Relative %: 63 %
Platelets: 397 10*3/uL (ref 150–400)
RBC: 3.35 MIL/uL — ABNORMAL LOW (ref 3.87–5.11)
RDW: 15.9 % — ABNORMAL HIGH (ref 11.5–15.5)
WBC: 7.4 10*3/uL (ref 4.0–10.5)
nRBC: 0 % (ref 0.0–0.2)

## 2024-11-17 MED ORDER — HYDROXYZINE HCL 10 MG PO TABS
10.0000 mg | ORAL_TABLET | Freq: Once | ORAL | Status: AC
  Administered 2024-11-17: 10 mg via ORAL
  Filled 2024-11-17: qty 1

## 2024-11-17 MED ORDER — KETOROLAC TROMETHAMINE 15 MG/ML IJ SOLN
15.0000 mg | Freq: Once | INTRAMUSCULAR | Status: AC
  Administered 2024-11-17: 15 mg via INTRAVENOUS
  Filled 2024-11-17: qty 1

## 2024-11-17 MED ORDER — GABAPENTIN 300 MG PO CAPS
300.0000 mg | ORAL_CAPSULE | Freq: Two times a day (BID) | ORAL | Status: DC
  Administered 2024-11-17 – 2024-11-18 (×3): 300 mg via ORAL
  Filled 2024-11-17 (×3): qty 1

## 2024-11-17 NOTE — Progress Notes (Addendum)
 Patient ID: [name], female   DOB: [birth year], 79 y.o.   MRN: [id number] Left femur shows prosthesis is stable no evidence of any disruption or fracture.  CT scan shows some mild left foraminal stenosis with a slight degree of settling of the intervertebral space status post fixation.  The foramen is still widely patent.  I suspect this represents some irritability for that left-sided nerve root.  Will continue to monitor and mobilize as tolerated.  Increase gabapentin  to 300 mg twice daily.

## 2024-11-17 NOTE — Progress Notes (Signed)
 "        [facility]                                                                               [name], is a 79 y.o. female, DOB - 1946-02-16, FMW:[id number] Admit date - 10/31/2024    Outpatient Primary MD for the patient is Onita Rush, MD  LOS - 17  days   HPI [name] is an 79 y.o. 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 CT of the lumbar spine showed fracture of old L4-L5 fusion with significant distraction.  Neurosurgery was consulted who recommended bedrest and a 30 degree angle for pain control and rehab.    Assessment and Plan: Acute close lumbar fracture S/p open reduction at the L4-L5 Repeat CT of the lumbar spine without contrast shows recent hyperextension injury at L5-S1 status post interval posterior fusion. Unchanged anterior distraction of the L5-S1 disc space. Regional postoperative changes and retroperitoneal hematoma. Neurosurgery on board recommending therapy evaluations at this time Pain control  Leukocytosis Resolved So far work up has been negative.  CXR is negative for infection.  UA is negative for infection.  MRI brain unremarkable.  CT lumbar spine with and without contrast, unremarkable for any post op infection As per Dr Colon the incision site looks clean.   Acute encephalopathy, unclear etiology, likely med induced Improved Suspect hospital delirium vs from IV dilaudid   Some hallucinations too. She has some involuntary jerks, myoclonic jerks ? From gabapentin , she is on high dose of gabapentin  600 mg TID, held Gabapentin  on 11/13/24, restart gabapentin  at a reduced dose of 600 mg at bedtime CT head with out contrast is negative. MRI brain does not show acute intracranial abnormalities.  TSh wnl.  Ammonia levels is wnl. Vit b12 level is low normal. Supplementation  ordered.  Redirection and frequent orientation recommended  LUE/BLE swelling- improving Acute DVT in LLE Left lower extremity pain/weakness Noted significant edema ProBNP 1,247 Echo done on 07/2024 showed EF of 60 to 65%, no regional wall motion abnormality, left ventricular diastolic parameters were normal Doppler LUE with superficial thrombus in cephalic vein Doppler left lower extremity with acute DVT extending from the common femoral vein, into the popliteal vein Stop IV Lasix  as Cr had a mild bump, I/O neg 13.5 L, hold PO lasix  for now CT lumbar spine, x-ray left femur without any obvious infection Restarted Xarelto  on 1/28, hem onc Dr Lonn recommend to continue to hold and plan to restart Xarelto  on 12/02/2024 (pt is fall high risk overall also has an IVC filter in place) Discussed extensively with Dr. Colon neurosurgery, Dr. Lonn heme-onc as well as patient and family extensively about the pros/cons of anticoagulation in this patient with a high fall risk as well as retroperitoneal bleed.  Family verbalized understanding  Hypokalemia Replace as needed  Possible oral thrush Nystatin  swish and swallow  Acute on chronic anemia Normocytic anemia Secondary to retroperitoneal hematoma Daily CBC  History of PE/DVT and an IVC filter in place Held xarelto  for the surgery and post  op paravertebral hematoma and retroperitoneal hematoma  Dr Lonn with hematology recommended to hold off on anti coagulation therapy for atleast 30 days as mentioned above  Acute urinary retention Patient had post voiding residual of more than 200 . Foley catheter was placed  Hypothyroidism Continue Synthroid   Lymphedema On p.o. Lasix  at home BLE Doppler as above Hold IV Lasix   Hypertension BP uncontrolled Increased cozaar  to 100 mg daily, continue atenolol  Patient has prn hydralazine  IV  Chronic swelling in the back of the right ear Firm, non tender, not associated with any  drainage. She denies any pain.  Recommend outpatient follow up with dermatology.   Morbid Obesity Body mass index is 52.26 kg/m. Poor prognostic factor  Estimated body mass index is 52.26 kg/m as calculated from the following:   Height as of this encounter: 5' 4 (1.626 m).   Weight as of this encounter: 138.1 kg.  Code Status: Full code DVT Prophylaxis:  Place and maintain sequential compression device Start: 11/06/24 0848 SCD's Start: 11/01/24 2359   Level of Care: Level of care: Med-Surg Family Communication: None at bedside  Disposition Plan:     Remains inpatient appropriate:  pending her participation in therapy. .    Procedures:  S/p lumbar fusion surgery.   Consultants:   NS  Hematology oncology.   Antimicrobials:   Anti-infectives (From admission, onward)    Start     Dose/Rate Route Frequency Ordered Stop   11/09/24 2200  cephALEXin  (KEFLEX ) capsule 500 mg        500 mg Oral Every 8 hours 11/09/24 1519 11/10/24 1434   11/06/24 1400  cephALEXin  (KEFLEX ) capsule 500 mg  Status:  Discontinued        500 mg Oral Every 8 hours 11/06/24 1036 11/09/24 1519   11/02/24 0045  ceFAZolin  (ANCEF ) IVPB 2g/100 mL premix        2 g 200 mL/hr over 30 Minutes Intravenous Every 8 hours 11/01/24 2358 11/02/24 0930        Medications  Scheduled Meds:  acidophilus  1 capsule Oral q AM   ALPRAZolam   0.5 mg Oral BID   atenolol   25 mg Oral Daily   Chlorhexidine  Gluconate Cloth  6 each Topical Daily   [START ON 11/20/2024] cyanocobalamin   1,000 mcg Intramuscular Q7 days   docusate sodium   100 mg Oral BID   escitalopram   5 mg Oral QODAY   famotidine   40 mg Oral QHS   feeding supplement  237 mL Oral BID BM   gabapentin   300 mg Oral BID   levothyroxine   25 mcg Oral Q M,W,F   lidocaine   1 patch Transdermal Q24H   losartan   100 mg Oral Daily   nystatin   5 mL Oral QID   pantoprazole   40 mg Oral Daily   senna  1 tablet Oral BID   simvastatin   20 mg Oral QHS   sodium  chloride flush  3 mL Intravenous Q12H   Continuous Infusions:   PRN Meds:.acetaminophen  **OR** acetaminophen , alum & mag hydroxide-simeth, atenolol  **AND** atenolol , bisacodyl , bisacodyl , hydrALAZINE , HYDROcodone -acetaminophen , melatonin, menthol  **OR** phenol, methocarbamol  **OR** methocarbamol  (ROBAXIN ) injection, ondansetron  **OR** ondansetron  (ZOFRAN ) IV, polyethylene glycol, sodium chloride  flush, sodium phosphate     Subjective:   Shantaya Bluestone was seen and examined today. Still complains of left lower extremity pain   Objective:   Vitals:   11/16/24 1958 11/16/24 2257 11/17/24 0344 11/17/24 0715  BP: (!) 143/55 (!) 115/53 (!) 151/60 (!) 158/67  Pulse: 76 78 75 71  Resp:  16 18 18   Temp: 99.2 F (37.3 C) 98.3 F (36.8 C) 98.1 F (36.7 C) 98.2 F (36.8 C)  TempSrc: Oral     SpO2: 94% 90% 93% 91%  Weight:      Height:        Intake/Output Summary (Last 24 hours) at 11/17/2024 1516 Last data filed at 11/17/2024 1028 Gross per 24 hour  Intake 190 ml  Output 1650 ml  Net -1460 ml   Filed Weights   10/31/24 1832 11/02/24 0144  Weight: 124.3 kg (!) 138.1 kg     Exam General: NAD, generalized edema, chronically ill-appearing Cardiovascular: S1, S2 present Respiratory: CTAB Abdomen: Soft, nontender, nondistended, bowel sounds present Musculoskeletal: bilateral pedal edema noted Skin: Normal Psychiatry: Normal mood         Data Reviewed:  I have personally reviewed following labs and imaging studies   CBC Lab Results  Component Value Date   WBC 7.4 11/17/2024   RBC 3.35 (L) 11/17/2024   HGB 9.4 (L) 11/17/2024   HCT 29.4 (L) 11/17/2024   MCV 87.8 11/17/2024   MCH 28.1 11/17/2024   PLT 397 11/17/2024   MCHC 32.0 11/17/2024   RDW 15.9 (H) 11/17/2024   LYMPHSABS 1.2 11/17/2024   MONOABS 1.0 11/17/2024   EOSABS 0.4 11/17/2024   BASOSABS 0.1 11/17/2024     Last metabolic panel Lab Results  Component Value Date   NA 137 11/17/2024   K 3.6  11/17/2024   CL 97 (L) 11/17/2024   CO2 31 11/17/2024   BUN 25 (H) 11/17/2024   CREATININE 1.05 (H) 11/17/2024   GLUCOSE 108 (H) 11/17/2024   GFRNONAA 54 (L) 11/17/2024   GFRAA >60 08/19/2018   CALCIUM  9.4 11/17/2024   PROT 6.5 11/12/2024   ALBUMIN 3.6 11/12/2024   BILITOT 0.9 11/12/2024   ALKPHOS 77 11/12/2024   AST 30 11/12/2024   ALT 21 11/12/2024   ANIONGAP 9 11/17/2024    CBG (last 3)  No results for input(s): GLUCAP in the last 72 hours.    Coagulation Profile: No results for input(s): INR, PROTIME in the last 168 hours.   Radiology Studies: DG FEMUR PORT MIN 2 VIEWS LEFT Result Date: 11/17/2024 EXAM: 2 VIEW(S) XRAY OF THE LEFT HIP 11/16/2024 07:15:00 PM COMPARISON: Left hip series dated 10/01/2021 and left knee series dated 10/01/2021. CLINICAL HISTORY: Left hip pain. FINDINGS: BONES AND JOINTS: The patient is again noted to be status post left total hip arthroplasty and left total knee arthroplasty. Both prostheses are intact and unremarkable. There is no apparent acute abnormality. No malalignment. SOFT TISSUES: Unremarkable. IMPRESSION: 1. No acute osseous abnormality, compared to 10/01/2021. 2. Status post left total hip arthroplasty and left total knee arthroplasty. Prostheses are intact and unremarkable. Electronically signed by: Evalene Coho MD 11/17/2024 07:58 AM EST RP Workstation: [id number]   CT LUMBAR SPINE WO CONTRAST Result Date: 11/17/2024 EXAM: CT OF THE LUMBAR SPINE WITHOUT CONTRAST 11/16/2024 10:30:23 PM TECHNIQUE: CT of the lumbar spine was performed without the administration of intravenous contrast. Multiplanar reformatted images are provided for review. Automated exposure control, iterative reconstruction, and/or weight based adjustment of the mA/kV was utilized to reduce the radiation dose to as low as reasonably achievable. COMPARISON: CT of the lumbar spine dated 11/14/2024. CLINICAL HISTORY: Low back pain, prior surgery, new symptoms; Recent  surgery with new left leg weakness. FINDINGS: BONES AND ALIGNMENT: Normal vertebral body heights. Extensive postoperative changes are again demonstrated within the visualized thoracolumbar spine. Fractures are again  demonstrated involving the posterior elements at L5-S1 with distraction of the disc space, as before. Radiopaque graft material is again noted within the disc space and the paraspinous soft tissues anterior to the disc space. There is also some radiopaque material along the left articular facets. The patient is status post bilateral posterolateral fixation at L5-S1. The patient is also status post posterolateral spinal fusion of T11 through L3. The orthopedic hardware is intact. There are laminectomy defects present bilaterally at L2 and on the right at L4. The patient is status post vertebroplasty of T10. Normal alignment. DEGENERATIVE CHANGES: No significant degenerative changes. SOFT TISSUES: Mild soft tissue stranding involving the right psoas musculature. Atrophic changes involving the left psoas. There is no appreciable fluid collection. VASCULATURE: Moderate calcific atheromatous disease within the abdominal aorta. An IVC filter is again demonstrated. IMPRESSION: 1. Extensive postoperative changes in the thoracolumbar spine, similar to CT lumbar spine dated 11/14/2024, including fractures involving the posterior elements at L5-S1 with distraction of the disc space and radiopaque graft material within the disc space and paraspinous soft tissues anterior to the disc space, as well as radiopaque material along the left articular facets. 2. Status post bilateral posterolateral fixation at L5-S1, posterolateral spinal fusion of T11 through L3, and vertebroplasty of T10. Orthopedic hardware is intact. 3. Laminectomy defects bilaterally at L2 and on the right at L4. 4. Mild soft tissue stranding involving the right psoas musculature and atrophic changes involving the left psoas. No appreciable fluid  collection. Electronically signed by: Evalene Coho MD 11/17/2024 07:56 AM EST RP Workstation: [id number]        Lebron JINNY Cage M.D. [facility] 11/17/2024, 3:16 PM  Available via [employer] secure chat 7am-7pm After 7 pm, please refer to night coverage provider listed on amion.    "

## 2024-11-17 NOTE — TOC Progression Note (Signed)
 Transition of Care [facility]) - Progression Note    Patient Details  Name: [name] MRN: [id number] Date of Birth: 1946-04-30  Transition of Care [facility]) CM/SW Contact  Luise JAYSON Pan, [place] Phone Number: 11/17/2024, 9:00 AM  Clinical Narrative:   CSW followed up with MD about possible DC today. Per MD, patient more likely to be stable Monday. CSW updated facility.     Expected Discharge Plan: IP Rehab Facility Barriers to Discharge: Continued Medical Work up, [employer] and Services   Discharge Planning Services: CM Consult   Living arrangements for the past 2 months: Single Family Home Expected Discharge Date: 11/16/24                                     Social Drivers of Health (SDOH) Interventions SDOH Screenings   Food Insecurity: No Food Insecurity (11/01/2024)  Housing: Low Risk (11/01/2024)  Transportation Needs: No Transportation Needs (11/01/2024)  Utilities: Not At Risk (11/01/2024)  Social Connections: Socially Integrated (11/01/2024)  Tobacco Use: Low Risk (11/01/2024)    Readmission Risk Interventions     No data to display

## 2024-11-18 DIAGNOSIS — I2699 Other pulmonary embolism without acute cor pulmonale: Secondary | ICD-10-CM | POA: Diagnosis not present

## 2024-11-18 DIAGNOSIS — D62 Acute posthemorrhagic anemia: Secondary | ICD-10-CM | POA: Diagnosis not present

## 2024-11-18 LAB — BASIC METABOLIC PANEL WITH GFR
Anion gap: 9 (ref 5–15)
BUN: 21 mg/dL (ref 8–23)
CO2: 30 mmol/L (ref 22–32)
Calcium: 9.7 mg/dL (ref 8.9–10.3)
Chloride: 97 mmol/L — ABNORMAL LOW (ref 98–111)
Creatinine, Ser: 0.91 mg/dL (ref 0.44–1.00)
GFR, Estimated: >60 mL/min
Glucose, Bld: 104 mg/dL — ABNORMAL HIGH (ref 70–99)
Potassium: 3.7 mmol/L (ref 3.5–5.1)
Sodium: 136 mmol/L (ref 135–145)

## 2024-11-18 MED ORDER — DIPHENHYDRAMINE-ZINC ACETATE 2-0.1 % EX CREA
1.0000 | TOPICAL_CREAM | Freq: Three times a day (TID) | CUTANEOUS | Status: DC
  Filled 2024-11-18: qty 28

## 2024-11-18 MED ORDER — ORAL CARE MOUTH RINSE: 15.0000 mL | OROMUCOSAL | Status: DC | PRN

## 2024-11-18 MED ORDER — MORPHINE SULFATE (PF) 2 MG/ML IV SOLN
0.5000 mg | INTRAVENOUS | Status: DC | PRN
  Administered 2024-11-18 – 2024-11-19 (×6): 0.5 mg via INTRAVENOUS
  Filled 2024-11-18 (×6): qty 1

## 2024-11-18 MED ORDER — HISTAMINE DIHYDROCHLORIDE 0.025 % EX CREA: 1.0000 "application " | TOPICAL_CREAM | Freq: Four times a day (QID) | CUTANEOUS | Status: DC | PRN

## 2024-11-18 MED ORDER — GABAPENTIN 300 MG PO CAPS
300.0000 mg | ORAL_CAPSULE | Freq: Three times a day (TID) | ORAL | Status: DC
  Administered 2024-11-18 – 2024-11-20 (×7): 300 mg via ORAL
  Filled 2024-11-18 (×7): qty 1

## 2024-11-18 MED ORDER — FUROSEMIDE 40 MG PO TABS
40.0000 mg | ORAL_TABLET | Freq: Every day | ORAL | Status: DC
  Administered 2024-11-18: 40 mg via ORAL
  Filled 2024-11-18: qty 1

## 2024-11-18 MED ORDER — HISTAMINE DIHYDROCHLORIDE 0.025 % EX CREA: 1.0000 "application " | TOPICAL_CREAM | Freq: Three times a day (TID) | CUTANEOUS | Status: DC

## 2024-11-18 NOTE — Progress Notes (Signed)
 Patient foley was not documented in the LDA. This RN notify MD. MD stated that it was likely placed on the day of surgery therefore that is the date that was put in.

## 2024-11-18 NOTE — Plan of Care (Signed)
   Problem: Activity: Goal: Risk for activity intolerance will decrease Outcome: Progressing

## 2024-11-18 NOTE — Progress Notes (Signed)
" °  °  Providing Compassionate, Quality Care - Together   NEUROSURGERY PROGRESS NOTE     S: No issues overnight.    O: EXAM:  BP (!) 161/62 (BP Location: Left Arm)   Pulse 71   Temp 98.1 F (36.7 C)   Resp 19   Ht 5' 4 (1.626 m)   Wt (!) 138.1 kg   LMP  (LMP Unknown)   SpO2 95%   BMI 52.26 kg/m     Awake, alert, oriented  Speech fluent, appropriate  MAEs   ASSESSMENT:  79 y.o. with L4-5 fracture/dislocation s/p ORIF     PLAN: -Continue supportive care -Activity as toelrated -Call w/ questions/concerns.   Camie Pickle, PAC  "

## 2024-11-18 NOTE — Progress Notes (Signed)
 "        [facility]                                                                               [name], is a 79 y.o. female, DOB - 1946-02-17, FMW:[id number] Admit date - 10/31/2024    Outpatient Primary MD for the patient is Onita Rush, MD  LOS - 18  days   HPI [name] is an 79 y.o. 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 CT of the lumbar spine showed fracture of old L4-L5 fusion with significant distraction.  Neurosurgery was consulted who recommended bedrest and a 30 degree angle for pain control and rehab.    Assessment and Plan: Acute close lumbar fracture S/p open reduction at the L4-L5 Repeat CT of the lumbar spine without contrast shows recent hyperextension injury at L5-S1 status post interval posterior fusion. Unchanged anterior distraction of the L5-S1 disc space. Regional postoperative changes and retroperitoneal hematoma. Neurosurgery on board recommending therapy evaluations at this time Pain control  Leukocytosis Resolved So far work up has been negative.  CXR is negative for infection.  UA is negative for infection.  MRI brain unremarkable.  CT lumbar spine with and without contrast, unremarkable for any post op infection As per Dr Colon the incision site looks clean.   Acute encephalopathy, unclear etiology, likely med induced Improved Suspect hospital delirium vs from IV dilaudid   Some hallucinations too. She has some involuntary jerks, myoclonic jerks ? From gabapentin , she is on high dose of gabapentin  600 mg TID, held Gabapentin  on 11/13/24, restart gabapentin  at a reduced dose of 600 mg at bedtime CT head with out contrast is negative. MRI brain does not show acute intracranial abnormalities.  TSh wnl.  Ammonia levels is wnl. Vit b12 level is low normal. Supplementation  ordered.  Redirection and frequent orientation recommended  LUE/BLE swelling- improving Acute DVT in LLE Left lower extremity pain/weakness Noted significant edema ProBNP 1,247 Echo done on 07/2024 showed EF of 60 to 65%, no regional wall motion abnormality, left ventricular diastolic parameters were normal Doppler LUE with superficial thrombus in cephalic vein Doppler left lower extremity with acute DVT extending from the common femoral vein, into the popliteal vein Stop IV Lasix  as Cr had a mild bump, I/O neg 13.5 L, hold PO lasix  for now CT lumbar spine, x-ray left femur without any obvious infection Restarted Xarelto  on 1/28, hem onc Dr Lonn recommend to continue to hold and plan to restart Xarelto  on 12/02/2024 (pt is fall high risk overall also has an IVC filter in place) Discussed extensively with Dr. Colon neurosurgery, Dr. Lonn heme-onc as well as patient and family extensively about the pros/cons of anticoagulation in this patient with a high fall risk as well as retroperitoneal bleed.  Family verbalized understanding  Hypokalemia Replace as needed  Possible oral thrush Nystatin  swish and swallow  Acute on chronic anemia Normocytic anemia Secondary to retroperitoneal hematoma Daily CBC  History of PE/DVT and an IVC filter in place Held xarelto  for the surgery and post  op paravertebral hematoma and retroperitoneal hematoma  Dr Lonn with hematology recommended to hold off on anti coagulation therapy for atleast 30 days as mentioned above  Acute urinary retention Patient had post voiding residual of more than 200 . Foley catheter was placed  Hypothyroidism Continue Synthroid   Lymphedema On p.o. Lasix  at home BLE Doppler as above Hold IV Lasix   Hypertension BP uncontrolled Increased cozaar  to 100 mg daily, continue atenolol  Patient has prn hydralazine  IV  Chronic swelling in the back of the right ear Firm, non tender, not associated with any  drainage. She denies any pain.  Recommend outpatient follow up with dermatology.   Morbid Obesity Body mass index is 52.26 kg/m. Poor prognostic factor  Estimated body mass index is 52.26 kg/m as calculated from the following:   Height as of this encounter: 5' 4 (1.626 m).   Weight as of this encounter: 138.1 kg.  Code Status: Full code DVT Prophylaxis:  Place and maintain sequential compression device Start: 11/06/24 0848 SCD's Start: 11/01/24 2359   Level of Care: Level of care: Med-Surg Family Communication: None at bedside  Disposition Plan:     Remains inpatient appropriate:  pending her participation in therapy. .    Procedures:  S/p lumbar fusion surgery.   Consultants:   NS  Hematology oncology.   Antimicrobials:   Anti-infectives (From admission, onward)    Start     Dose/Rate Route Frequency Ordered Stop   11/09/24 2200  cephALEXin  (KEFLEX ) capsule 500 mg        500 mg Oral Every 8 hours 11/09/24 1519 11/10/24 1434   11/06/24 1400  cephALEXin  (KEFLEX ) capsule 500 mg  Status:  Discontinued        500 mg Oral Every 8 hours 11/06/24 1036 11/09/24 1519   11/02/24 0045  ceFAZolin  (ANCEF ) IVPB 2g/100 mL premix        2 g 200 mL/hr over 30 Minutes Intravenous Every 8 hours 11/01/24 2358 11/02/24 0930        Medications  Scheduled Meds:  acidophilus  1 capsule Oral q AM   ALPRAZolam   0.5 mg Oral BID   atenolol   25 mg Oral Daily   Chlorhexidine  Gluconate Cloth  6 each Topical Daily   [START ON 11/20/2024] cyanocobalamin   1,000 mcg Intramuscular Q7 days   docusate sodium   100 mg Oral BID   escitalopram   5 mg Oral QODAY   famotidine   40 mg Oral QHS   feeding supplement  237 mL Oral BID BM   furosemide   40 mg Oral Daily   gabapentin   300 mg Oral BID   levothyroxine   25 mcg Oral Q M,W,F   lidocaine   1 patch Transdermal Q24H   losartan   100 mg Oral Daily   nystatin   5 mL Oral QID   pantoprazole   40 mg Oral Daily   senna  1 tablet Oral BID   simvastatin    20 mg Oral QHS   sodium chloride  flush  3 mL Intravenous Q12H   Continuous Infusions:   PRN Meds:.acetaminophen  **OR** acetaminophen , alum & mag hydroxide-simeth, atenolol  **AND** atenolol , bisacodyl , bisacodyl , hydrALAZINE , HYDROcodone -acetaminophen , melatonin, menthol  **OR** phenol, methocarbamol  **OR** methocarbamol  (ROBAXIN ) injection, morphine  injection, ondansetron  **OR** ondansetron  (ZOFRAN ) IV, polyethylene glycol, sodium chloride  flush, sodium phosphate     Subjective:   [name] was seen and examined today. Still complains of left lower extremity pain   Objective:   Vitals:   11/17/24 1947 11/18/24 0409 11/18/24 0742 11/18/24 1433  BP: (!) 152/56 (!) 139/57 ROLLEN)  161/62 133/68  Pulse: 67 71 71 65  Resp: 17 16 19 18   Temp: 98.9 F (37.2 C) 98.3 F (36.8 C) 98.1 F (36.7 C) 98.2 F (36.8 C)  TempSrc:  Oral    SpO2: 95% 93% 95% 91%  Weight:      Height:        Intake/Output Summary (Last 24 hours) at 11/18/2024 1657 Last data filed at 11/18/2024 1423 Gross per 24 hour  Intake --  Output 2250 ml  Net -2250 ml   Filed Weights   10/31/24 1832 11/02/24 0144  Weight: 124.3 kg (!) 138.1 kg     Exam General: NAD Cardiovascular: S1, S2 present Respiratory: CTAB Abdomen: Soft, nontender, nondistended, bowel sounds present Musculoskeletal: bilateral lymphedema  Skin: Normal Psychiatry: Normal mood         Data Reviewed:  I have personally reviewed following labs and imaging studies   CBC Lab Results  Component Value Date   WBC 7.4 11/17/2024   RBC 3.35 (L) 11/17/2024   HGB 9.4 (L) 11/17/2024   HCT 29.4 (L) 11/17/2024   MCV 87.8 11/17/2024   MCH 28.1 11/17/2024   PLT 397 11/17/2024   MCHC 32.0 11/17/2024   RDW 15.9 (H) 11/17/2024   LYMPHSABS 1.2 11/17/2024   MONOABS 1.0 11/17/2024   EOSABS 0.4 11/17/2024   BASOSABS 0.1 11/17/2024     Last metabolic panel Lab Results  Component Value Date   NA 136 11/18/2024   K 3.7 11/18/2024   CL 97  (L) 11/18/2024   CO2 30 11/18/2024   BUN 21 11/18/2024   CREATININE 0.91 11/18/2024   GLUCOSE 104 (H) 11/18/2024   GFRNONAA >60 11/18/2024   GFRAA >60 08/19/2018   CALCIUM  9.7 11/18/2024   PROT 6.5 11/12/2024   ALBUMIN 3.6 11/12/2024   BILITOT 0.9 11/12/2024   ALKPHOS 77 11/12/2024   AST 30 11/12/2024   ALT 21 11/12/2024   ANIONGAP 9 11/18/2024    CBG (last 3)  No results for input(s): GLUCAP in the last 72 hours.    Coagulation Profile: No results for input(s): INR, PROTIME in the last 168 hours.   Radiology Studies: DG FEMUR PORT MIN 2 VIEWS LEFT Result Date: 11/17/2024 EXAM: 2 VIEW(S) XRAY OF THE LEFT HIP 11/16/2024 07:15:00 PM COMPARISON: Left hip series dated 10/01/2021 and left knee series dated 10/01/2021. CLINICAL HISTORY: Left hip pain. FINDINGS: BONES AND JOINTS: The patient is again noted to be status post left total hip arthroplasty and left total knee arthroplasty. Both prostheses are intact and unremarkable. There is no apparent acute abnormality. No malalignment. SOFT TISSUES: Unremarkable. IMPRESSION: 1. No acute osseous abnormality, compared to 10/01/2021. 2. Status post left total hip arthroplasty and left total knee arthroplasty. Prostheses are intact and unremarkable. Electronically signed by: Evalene Coho MD 11/17/2024 07:58 AM EST RP Workstation: [id number]   CT LUMBAR SPINE WO CONTRAST Result Date: 11/17/2024 EXAM: CT OF THE LUMBAR SPINE WITHOUT CONTRAST 11/16/2024 10:30:23 PM TECHNIQUE: CT of the lumbar spine was performed without the administration of intravenous contrast. Multiplanar reformatted images are provided for review. Automated exposure control, iterative reconstruction, and/or weight based adjustment of the mA/kV was utilized to reduce the radiation dose to as low as reasonably achievable. COMPARISON: CT of the lumbar spine dated 11/14/2024. CLINICAL HISTORY: Low back pain, prior surgery, new symptoms; Recent surgery with new left leg  weakness. FINDINGS: BONES AND ALIGNMENT: Normal vertebral body heights. Extensive postoperative changes are again demonstrated within the visualized thoracolumbar spine. Fractures are again  demonstrated involving the posterior elements at L5-S1 with distraction of the disc space, as before. Radiopaque graft material is again noted within the disc space and the paraspinous soft tissues anterior to the disc space. There is also some radiopaque material along the left articular facets. The patient is status post bilateral posterolateral fixation at L5-S1. The patient is also status post posterolateral spinal fusion of T11 through L3. The orthopedic hardware is intact. There are laminectomy defects present bilaterally at L2 and on the right at L4. The patient is status post vertebroplasty of T10. Normal alignment. DEGENERATIVE CHANGES: No significant degenerative changes. SOFT TISSUES: Mild soft tissue stranding involving the right psoas musculature. Atrophic changes involving the left psoas. There is no appreciable fluid collection. VASCULATURE: Moderate calcific atheromatous disease within the abdominal aorta. An IVC filter is again demonstrated. IMPRESSION: 1. Extensive postoperative changes in the thoracolumbar spine, similar to CT lumbar spine dated 11/14/2024, including fractures involving the posterior elements at L5-S1 with distraction of the disc space and radiopaque graft material within the disc space and paraspinous soft tissues anterior to the disc space, as well as radiopaque material along the left articular facets. 2. Status post bilateral posterolateral fixation at L5-S1, posterolateral spinal fusion of T11 through L3, and vertebroplasty of T10. Orthopedic hardware is intact. 3. Laminectomy defects bilaterally at L2 and on the right at L4. 4. Mild soft tissue stranding involving the right psoas musculature and atrophic changes involving the left psoas. No appreciable fluid collection. Electronically  signed by: Evalene Coho MD 11/17/2024 07:56 AM EST RP Workstation: [id number]        Lebron JINNY Cage M.D. [facility] 11/18/2024, 4:57 PM  Available via Epic secure chat 7am-7pm After 7 pm, please refer to night coverage provider listed on amion.    "

## 2024-11-19 ENCOUNTER — Inpatient Hospital Stay (HOSPITAL_COMMUNITY)

## 2024-11-19 DIAGNOSIS — S32059D Unspecified fracture of fifth lumbar vertebra, subsequent encounter for fracture with routine healing: Secondary | ICD-10-CM | POA: Diagnosis not present

## 2024-11-19 LAB — BASIC METABOLIC PANEL WITH GFR
Anion gap: 13 (ref 5–15)
BUN: 23 mg/dL (ref 8–23)
CO2: 27 mmol/L (ref 22–32)
Calcium: 9.7 mg/dL (ref 8.9–10.3)
Chloride: 94 mmol/L — ABNORMAL LOW (ref 98–111)
Creatinine, Ser: 1.2 mg/dL — ABNORMAL HIGH (ref 0.44–1.00)
GFR, Estimated: 46 mL/min — ABNORMAL LOW
Glucose, Bld: 104 mg/dL — ABNORMAL HIGH (ref 70–99)
Potassium: 3.5 mmol/L (ref 3.5–5.1)
Sodium: 134 mmol/L — ABNORMAL LOW (ref 135–145)

## 2024-11-19 MED ORDER — ACETAMINOPHEN 500 MG PO TABS: 1000.0000 mg | ORAL_TABLET | Freq: Two times a day (BID) | ORAL | Status: AC

## 2024-11-19 MED ORDER — RIVAROXABAN 20 MG PO TABS: 20.0000 mg | ORAL_TABLET | Freq: Every day | ORAL | Status: AC

## 2024-11-19 MED ORDER — HYDROCODONE-ACETAMINOPHEN 5-325 MG PO TABS: 1.0000 | ORAL_TABLET | Freq: Four times a day (QID) | ORAL | 0 refills | Status: AC | PRN

## 2024-11-19 MED ORDER — IOHEXOL 350 MG/ML SOLN
75.0000 mL | Freq: Once | INTRAVENOUS | Status: AC | PRN
  Administered 2024-11-19: 75 mL via INTRAVENOUS

## 2024-11-19 MED ORDER — BACLOFEN 10 MG PO TABS: 10.0000 mg | ORAL_TABLET | Freq: Three times a day (TID) | ORAL | 0 refills | Status: DC

## 2024-11-19 MED ORDER — RIVAROXABAN 20 MG PO TABS
20.0000 mg | ORAL_TABLET | Freq: Every day | ORAL | Status: DC
  Administered 2024-11-19 – 2024-11-20 (×2): 20 mg via ORAL
  Filled 2024-11-19 (×2): qty 1

## 2024-11-19 MED ORDER — GABAPENTIN 300 MG PO CAPS: 300.0000 mg | ORAL_CAPSULE | Freq: Three times a day (TID) | ORAL | Status: AC

## 2024-11-19 MED ORDER — BACLOFEN 10 MG PO TABS: 10.0000 mg | ORAL_TABLET | Freq: Three times a day (TID) | ORAL | 0 refills | Status: AC

## 2024-11-19 NOTE — Plan of Care (Signed)

## 2024-11-19 NOTE — Progress Notes (Signed)
 Patient ID: [name], female   DOB: 08-Dec-1945, 79 y.o.   MRN: [id number] Patient is awake and alert complains of typical back pain.  Left lower extremity pain seems to be less prominent.  She has been started on anticoagulation secondary to potential for micro emboli in the lungs.  Motor function remains stable.  She is to be transferred to a SNF in the next day or so.

## 2024-11-19 NOTE — Progress Notes (Signed)
 PT Cancellation Note  Patient Details Name: [name] MRN: [id number] DOB: 15-Nov-1945   Cancelled Treatment:    Reason Eval/Treat Not Completed: (P) Other (comment) (per RN, pt soon to be transported off unit for CT chest to rule out PE, not yet medically appropriate.) Will continue efforts per PT plan of care later in the day as scheduled permits.   Kahleb Mcclane M [name] 11/19/2024, 11:08 AM

## 2024-11-20 LAB — BASIC METABOLIC PANEL WITH GFR
Anion gap: 9 (ref 5–15)
BUN: 25 mg/dL — ABNORMAL HIGH (ref 8–23)
CO2: 30 mmol/L (ref 22–32)
Calcium: 9.7 mg/dL (ref 8.9–10.3)
Chloride: 94 mmol/L — ABNORMAL LOW (ref 98–111)
Creatinine, Ser: 1.22 mg/dL — ABNORMAL HIGH (ref 0.44–1.00)
GFR, Estimated: 45 mL/min — ABNORMAL LOW
Glucose, Bld: 107 mg/dL — ABNORMAL HIGH (ref 70–99)
Potassium: 3.5 mmol/L (ref 3.5–5.1)
Sodium: 132 mmol/L — ABNORMAL LOW (ref 135–145)

## 2024-11-20 NOTE — Progress Notes (Signed)
 Patient ID: [name], female   DOB: [birth year], 79 y.o.   MRN: [id number] Vital signs are stable Patient's incision is clean and dry on her back She is appropriate for transfer to SNF at this time Follow-up with me as an outpatient in 1 month's time.

## 2025-01-10 ENCOUNTER — Ambulatory Visit (HOSPITAL_COMMUNITY)

## 2025-01-10 ENCOUNTER — Encounter
